# Patient Record
Sex: Female | Born: 1945 | Race: Black or African American | Hispanic: No | Smoking: Former smoker
Health system: Southern US, Community
[De-identification: ages and names within clinical notes are randomized; demographics above are authoritative.]

## PROBLEM LIST (undated history)

## (undated) DIAGNOSIS — H409 Unspecified glaucoma: Secondary | ICD-10-CM

## (undated) DIAGNOSIS — Z9289 Personal history of other medical treatment: Secondary | ICD-10-CM

## (undated) DIAGNOSIS — E039 Hypothyroidism, unspecified: Secondary | ICD-10-CM

## (undated) DIAGNOSIS — R002 Palpitations: Secondary | ICD-10-CM

## (undated) DIAGNOSIS — N289 Disorder of kidney and ureter, unspecified: Secondary | ICD-10-CM

## (undated) DIAGNOSIS — R351 Nocturia: Secondary | ICD-10-CM

## (undated) DIAGNOSIS — F32A Depression, unspecified: Secondary | ICD-10-CM

## (undated) DIAGNOSIS — F329 Major depressive disorder, single episode, unspecified: Secondary | ICD-10-CM

## (undated) DIAGNOSIS — C50919 Malignant neoplasm of unspecified site of unspecified female breast: Secondary | ICD-10-CM

## (undated) DIAGNOSIS — C801 Malignant (primary) neoplasm, unspecified: Secondary | ICD-10-CM

## (undated) DIAGNOSIS — M1990 Unspecified osteoarthritis, unspecified site: Secondary | ICD-10-CM

## (undated) DIAGNOSIS — G4733 Obstructive sleep apnea (adult) (pediatric): Secondary | ICD-10-CM

## (undated) DIAGNOSIS — I10 Essential (primary) hypertension: Secondary | ICD-10-CM

## (undated) DIAGNOSIS — E7800 Pure hypercholesterolemia, unspecified: Secondary | ICD-10-CM

## (undated) DIAGNOSIS — N898 Other specified noninflammatory disorders of vagina: Secondary | ICD-10-CM

## (undated) DIAGNOSIS — E079 Disorder of thyroid, unspecified: Secondary | ICD-10-CM

## (undated) DIAGNOSIS — K219 Gastro-esophageal reflux disease without esophagitis: Secondary | ICD-10-CM

## (undated) HISTORY — DX: Unspecified glaucoma: H40.9

## (undated) HISTORY — PX: MASTECTOMY: SHX3

## (undated) HISTORY — DX: Obstructive sleep apnea (adult) (pediatric): G47.33

## (undated) HISTORY — DX: Essential (primary) hypertension: I10

## (undated) HISTORY — DX: Disorder of thyroid, unspecified: E07.9

## (undated) HISTORY — DX: Malignant (primary) neoplasm, unspecified: C80.1

## (undated) HISTORY — PX: COLONOSCOPY: SHX174

## (undated) HISTORY — DX: Depression, unspecified: F32.A

## (undated) HISTORY — PX: BREAST BIOPSY: SHX20

## (undated) HISTORY — DX: Major depressive disorder, single episode, unspecified: F32.9

## (undated) HISTORY — PX: THYROIDECTOMY: SHX17

## (undated) HISTORY — DX: Pure hypercholesterolemia, unspecified: E78.00

---

## 1980-08-16 HISTORY — PX: ABDOMINAL HYSTERECTOMY: SHX81

## 1997-12-31 ENCOUNTER — Ambulatory Visit (HOSPITAL_COMMUNITY): Admission: RE | Admit: 1997-12-31 | Discharge: 1997-12-31 | Payer: Self-pay | Admitting: *Deleted

## 1998-07-08 ENCOUNTER — Other Ambulatory Visit: Admission: RE | Admit: 1998-07-08 | Discharge: 1998-07-08 | Payer: Self-pay | Admitting: *Deleted

## 1999-11-26 ENCOUNTER — Ambulatory Visit (HOSPITAL_COMMUNITY): Admission: RE | Admit: 1999-11-26 | Discharge: 1999-11-26 | Payer: Self-pay | Admitting: *Deleted

## 1999-12-22 ENCOUNTER — Other Ambulatory Visit: Admission: RE | Admit: 1999-12-22 | Discharge: 1999-12-22 | Payer: Self-pay | Admitting: Obstetrics and Gynecology

## 2000-06-23 ENCOUNTER — Emergency Department (HOSPITAL_COMMUNITY): Admission: EM | Admit: 2000-06-23 | Discharge: 2000-06-23 | Payer: Self-pay | Admitting: Internal Medicine

## 2001-11-04 ENCOUNTER — Emergency Department (HOSPITAL_COMMUNITY): Admission: EM | Admit: 2001-11-04 | Discharge: 2001-11-04 | Payer: Self-pay | Admitting: Emergency Medicine

## 2001-11-04 ENCOUNTER — Encounter: Payer: Self-pay | Admitting: Emergency Medicine

## 2002-07-31 ENCOUNTER — Encounter: Admission: RE | Admit: 2002-07-31 | Discharge: 2002-07-31 | Payer: Self-pay | Admitting: Internal Medicine

## 2002-07-31 ENCOUNTER — Encounter: Payer: Self-pay | Admitting: Internal Medicine

## 2002-08-16 DIAGNOSIS — C801 Malignant (primary) neoplasm, unspecified: Secondary | ICD-10-CM

## 2002-08-16 HISTORY — PX: BREAST SURGERY: SHX581

## 2002-08-16 HISTORY — DX: Malignant (primary) neoplasm, unspecified: C80.1

## 2002-11-19 ENCOUNTER — Encounter (INDEPENDENT_AMBULATORY_CARE_PROVIDER_SITE_OTHER): Payer: Self-pay | Admitting: *Deleted

## 2002-11-19 ENCOUNTER — Ambulatory Visit (HOSPITAL_COMMUNITY): Admission: RE | Admit: 2002-11-19 | Discharge: 2002-11-19 | Payer: Self-pay | Admitting: *Deleted

## 2002-12-21 ENCOUNTER — Other Ambulatory Visit: Admission: RE | Admit: 2002-12-21 | Discharge: 2002-12-21 | Payer: Self-pay | Admitting: Radiology

## 2003-02-11 ENCOUNTER — Ambulatory Visit (HOSPITAL_BASED_OUTPATIENT_CLINIC_OR_DEPARTMENT_OTHER): Admission: RE | Admit: 2003-02-11 | Discharge: 2003-02-11 | Payer: Self-pay | Admitting: Surgery

## 2003-02-11 ENCOUNTER — Encounter (INDEPENDENT_AMBULATORY_CARE_PROVIDER_SITE_OTHER): Payer: Self-pay | Admitting: *Deleted

## 2003-02-25 ENCOUNTER — Encounter: Payer: Self-pay | Admitting: Surgery

## 2003-02-25 ENCOUNTER — Encounter (HOSPITAL_COMMUNITY): Admission: RE | Admit: 2003-02-25 | Discharge: 2003-05-26 | Payer: Self-pay | Admitting: Surgery

## 2003-02-26 ENCOUNTER — Encounter: Payer: Self-pay | Admitting: Surgery

## 2003-04-25 ENCOUNTER — Ambulatory Visit (HOSPITAL_BASED_OUTPATIENT_CLINIC_OR_DEPARTMENT_OTHER): Admission: RE | Admit: 2003-04-25 | Discharge: 2003-04-25 | Payer: Self-pay | Admitting: Surgery

## 2003-04-25 ENCOUNTER — Encounter (INDEPENDENT_AMBULATORY_CARE_PROVIDER_SITE_OTHER): Payer: Self-pay | Admitting: Surgery

## 2003-04-25 ENCOUNTER — Encounter (INDEPENDENT_AMBULATORY_CARE_PROVIDER_SITE_OTHER): Payer: Self-pay | Admitting: *Deleted

## 2003-05-07 ENCOUNTER — Ambulatory Visit: Admission: RE | Admit: 2003-05-07 | Discharge: 2003-06-15 | Payer: Self-pay | Admitting: Radiation Oncology

## 2003-06-20 ENCOUNTER — Ambulatory Visit (HOSPITAL_COMMUNITY): Admission: RE | Admit: 2003-06-20 | Discharge: 2003-06-20 | Payer: Self-pay | Admitting: Surgery

## 2003-06-20 ENCOUNTER — Encounter (INDEPENDENT_AMBULATORY_CARE_PROVIDER_SITE_OTHER): Payer: Self-pay | Admitting: *Deleted

## 2003-06-20 ENCOUNTER — Ambulatory Visit (HOSPITAL_BASED_OUTPATIENT_CLINIC_OR_DEPARTMENT_OTHER): Admission: RE | Admit: 2003-06-20 | Discharge: 2003-06-20 | Payer: Self-pay | Admitting: Surgery

## 2004-07-27 ENCOUNTER — Ambulatory Visit (HOSPITAL_BASED_OUTPATIENT_CLINIC_OR_DEPARTMENT_OTHER): Admission: RE | Admit: 2004-07-27 | Discharge: 2004-07-27 | Payer: Self-pay | Admitting: Otolaryngology

## 2004-10-28 ENCOUNTER — Ambulatory Visit (HOSPITAL_COMMUNITY): Admission: RE | Admit: 2004-10-28 | Discharge: 2004-10-28 | Payer: Self-pay | Admitting: *Deleted

## 2004-11-05 ENCOUNTER — Ambulatory Visit: Payer: Self-pay

## 2004-11-11 ENCOUNTER — Ambulatory Visit: Payer: Self-pay

## 2006-05-02 ENCOUNTER — Ambulatory Visit: Payer: Self-pay | Admitting: Pulmonary Disease

## 2006-06-01 ENCOUNTER — Ambulatory Visit: Payer: Self-pay | Admitting: *Deleted

## 2006-06-06 ENCOUNTER — Ambulatory Visit: Payer: Self-pay | Admitting: Pulmonary Disease

## 2006-06-08 ENCOUNTER — Ambulatory Visit: Payer: Self-pay

## 2008-03-04 ENCOUNTER — Ambulatory Visit: Payer: Self-pay

## 2010-09-06 ENCOUNTER — Encounter: Payer: Self-pay | Admitting: Emergency Medicine

## 2011-01-01 NOTE — Op Note (Signed)
NAME:  Alyssa Jefferson, Alyssa Jefferson                          ACCOUNT NO.:  [id number]   MEDICAL RECORD NO.:  [id number]                   PATIENT TYPE:  AMB   LOCATION:  DSC                                  FACILITY:  [facility]   PHYSICIAN:  Currie Paris, M.D.           DATE OF BIRTH:  1946/01/01   DATE OF PROCEDURE:  06/20/2003  DATE OF DISCHARGE:                                 OPERATIVE REPORT   CCS [id number]   PREOPERATIVE DIAGNOSIS:  Diffuse ductal carcinoma in situ, left breast.   POSTOPERATIVE DIAGNOSIS:  Diffuse ductal carcinoma in situ, left breast.   OPERATION PERFORMED:  Left total mastectomy with blue dye injection and left  axillary sentinel lymph node biopsy.   SURGEON:  Currie Paris, M.D.   ASSISTANT:  Ollen Gross. Carolynne Edouard, M.D.   ANESTHESIA:  General endotracheal.   INDICATIONS FOR PROCEDURE:  This patient is a 65 year old lady who presented  with nipple discharge and a biopsy showed DCIS.  This was very centrally  located and under the nipple so she underwent a partial mastectomy to  include excision of the nipple areolar complex.  Unfortunately, she had  diffuse DCIS including margins involved and after lengthy discussion, she  elected to proceed to mastectomy.  She had a plastic surgery consultation  but elected not at this point to have an immediate reconstruction.  Because  we are doing a total mastectomy and were concerned that there might be some  invasive disease turned up, elected to proceed to sentinel node biopsy at  this time.   DESCRIPTION OF PROCEDURE:  The patient was seen in the holding area and had  no further questions.  She had already been injected with her radioactive  isotope.  She was taken to the operating room and after satisfactory general  endotracheal anesthesia had been obtained, about 3mL of dilute methylene  blue solution was injected in the dermis above the prior scar.  The breast  was then prepped and draped.  I outlined and then made an  elliptical  incision excising the old scar.  Using cautery, I raised the skin flap  medially to the sternum, superiorly to the clavicle and then into the  axilla.  I used the NeoProbe to identify a hot area in the axilla. When I  got the skin elevated so I could expose this area, I divided the  subcutaneous tissue and entered the axilla.  I found initially a firm node  which appeared to be hot and I excised that but on taking it out, it  appeared that there was a node directly below this that was the hot one and  this one was not.  This second node was excised and was not blue but did  have increased counts.  There was a third very firm node just below this and  this was also taken out but again was not hot or blue.  There was one other  area that was hot.  Further dissection this located a little bit more  inferiorly a very blue hot node that was firm but not rock hard and this was  excised.  We asked pathology to do Touch Preps on the two hot nodes but not  the other two, were sent for routine pathology.   While waiting for that to come, I went ahead and made the inferior skin  incision and raised an inferior flap to the inframammary fold and then  laterally to latissimus.  The breast was removed from the underlying muscle  with cautery starting medially and working laterally and finally detached  from its incisions to the axillary contents without really doing any further  axillary dissection.  Bleeding was checked and appeared to be dry.  I used  clips on some vessels in the axilla and primarily cautery on the skin flaps.  The flaps appeared to be viable, neither too thick, nor too thin, but the  patient is obese and I thought there was likely going to be some extra  tissues laterally but we appeared to have very minimal of this and I thought  we didn't need to take any further at this point.  Two 19 Blake drains were  placed and secured with 3-0 nylons.  The incision closed with  staples.  I  left 30mL of plain 0.25% Marcaine subcutaneously for about 10 minutes at the  end of the case to see if that would help with postoperative analgesia.  The  patient tolerated the procedure well.  There were no operative  complications.  All counts were correct.  The estimated blood loss was about  75mL.                                               Currie Paris, M.D.    CJS/MEDQ  D:  06/20/2003  T:  06/21/2003  Job:  [id number]   cc:   Wilson Singer, M.D.  [address]  [place]  [place] [address]  Fax: [phone]

## 2011-01-01 NOTE — Op Note (Signed)
Alyssa Jefferson, Alyssa Jefferson                          ACCOUNT NO.:  [id number]   MEDICAL RECORD NO.:  [id number]                   PATIENT TYPE:  AMB   LOCATION:  DSC                                  FACILITY:  [facility]   PHYSICIAN:  Currie Paris, M.D.           DATE OF BIRTH:  10-Mar-1946   DATE OF PROCEDURE:  04/25/2003  DATE OF DISCHARGE:                                 OPERATIVE REPORT   CCS #[id number]   PREOPERATIVE DIAGNOSIS:  Ductal carcinoma in situ, left breast.   POSTOPERATIVE DIAGNOSIS:  Ductal carcinoma in situ, left breast.   OPERATION:  Left partial mastectomy.   SURGEON:  Currie Paris, M.D.   ANESTHESIA:  General.   CLINICAL HISTORY:  This patient is a 65 year old who had a nipple discharge,  and a biopsy of the subareolar tissue showed diffuse DCIS with positive  margins.  MRI showed disease probably going almost to the chest wall and  extending around this central area of her breast.  After a lengthy  discussion with the patient about alternatives as well as second opinion  consultation, she elected to do a partial mastectomy to include resection of  the nipple-areolar complex.   DESCRIPTION OF PROCEDURE:  The patient was seen in the holding area and had  no further questions.  The left breast was marked as the operative side.   The patient was taken to the operating room and after satisfactory general  endotracheal anesthesia obtained, the left breast was prepped and draped.  I  outlined an elliptical transverse incision going around the nipple and made  the skin incision.  Using a cautery, I divided through the subcutaneous  tissue, breast tissue, down to chest wall, first superiorly, then medially,  then inferiorly, and following disconnected the specimen from its lateral  attachments.  I did go down to chest wall, leaving just a little bit of  fatty tissue on the muscle.  Bleeders were controlled with the cautery.  In  palpating the residual margins,  there was some irregular nodular tissue  along the superior margin laterally, and I took another 1.5 cm thickness  area of tissue here and sent this as a separate specimen in case there was  some DCIS involving this area of breast tissue going up toward the axilla.  Again everything appeared to be dry.  I had injected some Marcaine in the  skin prior to making the incision and injected some more in the deeper  tissues prior to closing.   Once everything appeared to be dry, I closed in layers with running 3-0  Vicryl and then 3-0 Monocryl subcuticular plus Dermabond on the skin.   The patient tolerated the procedure well.  There were no operative  complications.  All counts were correct.  Currie Paris, M.D.    CJS/MEDQ  D:  04/25/2003  T:  04/25/2003  Job:  [id number]   cc:   Wilson Singer, M.D.  [address]  [place]  [place] [address]  Fax: [phone]   [facility] Radiology   [facility]

## 2011-01-01 NOTE — Assessment & Plan Note (Signed)
[facility]                               PULMONARY OFFICE NOTE   NAME:MONTAGUESusi, Goslin                       MRN:          [id number]  DATE:06/06/2006                            DOB:          [birth year]    SUBJECTIVE:  Ms. Konen comes in today for followup after her recent  initiation on CPAP.  The patient is doing very well with this.  She is  having no problems with mask leak and the pressure has not been an issue for  her.  She is seeing a much improved alertness during the day as well as  sleep efficiency.   PHYSICAL EXAM:  GENERAL:  She is an overweight black female in no acute  distress.  Blood pressure 132/84, pulse 57, temperature 98.1, weight 237 pounds, O2  saturation on room air is 97%.  There is no evidence of skin breakdown or pressure necrosis from the CPAP  mask.   IMPRESSION:  Severe obstructive sleep apnea, which is much improved  clinically with continuous positive airway pressure.  At this point in time,  we need to optimize the patient's pressure for her, and she needs to work  aggressively on weight loss.   PLAN:  1. Arrange for auto-titrate device for the next 2 weeks that we can      optimize her CPAP pressure.  I will then call her and have her machine      set to the appropriate level.  2. Work on weight loss.  3. Follow up in 6 months or sooner if there are problems.            ______________________________  Barbaraann Share, MD,FCCP      [facility]/MedQ  DD:  06/06/2006  DT:  06/06/2006  Job #:  [id number]   cc:   Hermelinda Medicus, M.D.  [place] Cleta Alberts, M.D.

## 2011-01-01 NOTE — Letter (Signed)
June 01, 2006    Brett Canales A. Cleta Alberts, M.D.  [address]  [place], [place] [address]   RE:  Alyssa Jefferson, Alyssa Jefferson  MRN:  [id number]  /  DOB:  1946/06/22   Dear Brett Canales:   I appreciate the opportunity of evaluating your nice patient, Detrice Cales,  on June 01, 2006.  As you know, she is a very pleasant 65 year old,  obese, black female, [employer] employee, with multiple risk factors including  hypertension, hyperlipidemia, obesity and severe obstructive sleep apnea.  As you noted, on the 5th of October she experienced rather significant chest  discomfort while walking at work.  This was relieved within minutes of rest.  She had some shortness of breath with increased heartbeat when walking into  the office today.   She has had no other episodes of chest discomfort.  She had a normal stress  Myoview November 11, 2004.  Recently she was evaluated by Dr. Marcelyn Bruins for  sleep apnea.   PAST MEDICAL HISTORY:  As noted, reveals:  1. Hypertension.  2. Hyperlipidemia.  3. Breast cancer with left mastectomy.  4. Allergic rhinitis.  5. Post hysterectomy.  6. Post thyroidectomy, on Synthroid replacement.  7. History of knee surgery.   MEDICATIONS:  1. Aspirin 81 mg.  2. Synthroid unknown dose.  3. Hydrochlorothiazide unknown dose.  4. Zoloft.  5. Lisinopril, of which the dosage is unknown at this time.   SOCIAL HISTORY:  She is married and has 5 stepchildren.  She smoked a pack a  week for 10 years, but none since 1985.  She works at [employer], where she  has been for 25 years.  No drug use.   FAMILY HISTORY:  Remarkable for a sister with allergies and asthma, father  had lung cancer.  Both parents deceased, no cardiovascular disease.  One  brother and two sisters living, with no cardiovascular disease.  There is a  family history of hypertension, diabetes, gout.   ALLERGIES:  CODEINE.   REVIEW OF SYSTEMS:  HEENT:  History of tonsillitis.  CARDIORESPIRATORY:  Is  in the present illness.   GASTROINTESTINAL:  History unremarkable.  She has  had transfusions in the past.  GYNECOLOGIC:  History unremarkable.  Weight  maximum 241, minimum 230.   PHYSICAL EXAMINATION:  VITAL SIGNS:  Blood pressure 138/88, pulse 59, normal  sinus rhythm.  GENERAL APPEARANCE:  Obese, in no distress.  NECK:  JVP is not elevated, carotid pulses are palpable and equal without  bruits.  LUNGS:  Clear.  CARDIAC:  Exam reveals no murmur.  ABDOMEN:  Exam unremarkable.  Liver, spleen and kidneys not palpable.  EXTREMITIES:  Normal pulses palpable and equal bilaterally.   EKG within normal limits.   IMPRESSION:  1. Chest pain with multiple risk factors for coronary artery disease, with      coronary disease to be ruled out - normal stress test a year and a half      ago.  2. Obesity.  3. Severe sleep apnea.  4. Hyperlipidemia.  5. Hypertension.   Since the patient has not had recurrent chest pain in the past 12 days, I  suggested a stress Myoview.  We also plan to check her lipids, as I think  she should probably be on a statin, and we will check a D-dimer as we want  to rule out the possibility of pulmonary emboli.   Thank you for the opportunity of evaluating this nice patient with you.   Sincerely,  ______________________________  E. Graceann Congress, MD, [facility]    EJL/MedQ  /  Job #:  [id number]  DD:  06/01/2006 / DT:  06/03/2006

## 2011-01-01 NOTE — Assessment & Plan Note (Signed)
[facility]                               PULMONARY OFFICE NOTE   NAME:Alyssa Jefferson, Gohlke                       MRN:          [id number]  DATE:05/02/2006                            DOB:          1945-12-16    SLEEP MEDICINE CONSULTATION   HISTORY OF PRESENT ILLNESS:  The patient is a very pleasant 65 year old  black female whom I have been asked to see for severe sleep apnea.  The  patient has undergone nocturnal polysomnography on July 27, 2004, where  she was found to have a respiratory disturbance index of 60 events per hour  and O2 saturation as low as 64%.  Since that study, she has gained about 30  pounds, over the last 1 year, and therefore is very likely to have more  severe sleep apnea than indicated on this study.  The patient states that  she typically goes to be between 9:30 and 10:00 and gets up at 4:50 to start  her day.  She is not rested upon arising.  She has been told that she has  loud snoring and pauses in her breathing during sleep.  The patient works as  a Secondary school teacher for [employer] and notes significant sleep pressure during the day  with periods of inactivity.  She has occasional dozing in the evening with  TV or movies.  She also gets some sleepiness with driving long distances.   PAST MEDICAL HISTORY:  1. Significant for hypertension.  2. History of dyslipidemia.  3. History of breast cancer with left mastectomy in the past, but no      chemotherapy.  4. History of allergic rhinitis.  5. Status post hysterectomy.  6. Status post thyroidectomy and is on Synthroid replacement.  7. history of knee surgery 20 years ago.   CURRENT MEDICATIONS:  1. Aspirin 81 mg q. day.  2. Synthroid of unknown dose q. day.  3. Hydrochlorothiazide of unknown dose q. day.  4. Zoloft of unknown dose q. day.  5. Lisinopril of unknown dose q. day.   THE PATIENT IS INTOLERANT TO CODEINE.   SOCIAL HISTORY:  She is married.  She has a history of  smoking one pack per  week for 10 years.  She has not smoked since 1985.   FAMILY HISTORY:  Remarkable for a sister having allergy and asthma.  Her  father had lung cancer.   REVIEW OF SYSTEMS:  As per history of present illness.  Also see the patient  intake form  documented in the chart.   PHYSICAL EXAM:  GENERAL:  She is a morbidly obese black female in no acute  distress.  VITAL SIGNS:  Blood pressure 144/80, pulse 50, temperature 97.6, weight 241  pounds.  O2 saturation on room air is 95%.  HEENT:  Pupils are equal, round, and reactive to light and accommodation.  Extraocular muscles are intact.  Nares show mild septal deviation to the  left.  Oropharynx shows soft tissue redundancy in the posterior pharynx with  side-wall narrowing, and also elongation of the palate, but a normal uvula.  NECK:  Supple without JVD or lymphadenopathy.  There is no palpable  thyromegaly.  CHEST:  Totally clear.  CARDIAC:  Regular rate and rhythm with a 1/6 systolic murmur.  ABDOMEN:  Soft and nontender with good bowel sounds.  GENITAL:  Not done and not indicated.  RECTAL:  Not done and not indicated.  BREASTS:  Not done and not indicated.  EXTREMITIES:  Lower extremities show no edema.  Pulses are intact distally.  NEUROLOGIC:  Alert and oriented with no obvious observable motor defects.   IMPRESSION:  Severe obstructive sleep apnea.  I have gone over the  pathophysiology of sleep apnea with her, including the short-term quality of  life issues and the long-term cardiovascular issues.  At this point in time,  continuous positive airway pressure coupled with weight loss is in her best  interest for treatment.  The patient is agreeable with this approach.   PLAN:  1. Will initiate CPAP at 10 cm and she is to follow up in four weeks so we      can see how she responds.  Ultimately, she will need pressure      optimization.  2. Work on weight loss.  3. I have asked the patient to check with Dr.  Cleta Alberts to see that he has had      a recent TSH level checked.                                   Barbaraann Share, MD,FCCP   [facility]/MedQ  DD:  05/02/2006  DT:  05/02/2006  Job #:  [id number]   cc:   Brett Canales A. Cleta Alberts, M.D.  Hermelinda Medicus, M.D.

## 2011-01-01 NOTE — Procedures (Signed)
Alyssa Jefferson, Alyssa Jefferson                ACCOUNT NO.:  [id number]   MEDICAL RECORD NO.:  [id number]          PATIENT TYPE:  OUT   LOCATION:  SLEEP CENTER                 FACILITY[facility]  [facility]   PHYSICIAN:  Clinton D. Maple Hudson, M.D. DATE OF BIRTH:  06/05/1946   DATE OF STUDY:  07/27/2004                              NOCTURNAL POLYSOMNOGRAM   REFERRING PHYSICIAN:  Dr. Hermelinda Medicus.   INDICATION FOR STUDY:  Hypersomnia with sleep apnea.   SLEEP ARCHITECTURE:  Total sleep time 331 minutes with sleep efficiency 88%,  stage I 12%, stage II 68%, stages III and IV were absent.  REM was 20% of  total sleep time.  Sleep latency 200 minutes.  REM latency 184 minutes.  Awake after sleep onset 18 minutes.  Arousal index 45.   RESPIRATORY DATA -- NOCTURNAL POLYSOMNOGRAPHIC PROTOCOL:  RDI 59.7 per hour,  indicating severe obstructive sleep apnea/hypopnea syndrome.  This included  125 obstructive apneas and 177 hypopneas.  Events were not positional.  REM  RDI 120 per hour.   OXYGEN DATA:  Loud snoring with oxygen desaturation to a nadir of 64% during  apneas.  Mean oxygen saturation through the study was 95% on room air.   CARDIAC DATA:  Normal sinus rhythm.   MOVEMENT/PARASOMNIA:  Occasional leg jerk, insignificant.   IMPRESSION/RECOMMENDATION:  Severe obstructive sleep apnea/hypopnea  syndrome, respiratory disturbance index 59.7 per hour with desaturation to  64% on room air.  Consider return for continuous positive airway pressure  titration or evaluation for alternative therapies as appropriate.                                                           Clinton D. Maple Hudson, M.D.  Diplomate, [employer] Board   CDY/MEDQ  D:  08/02/2004 11:13:55  T:  08/03/2004 11:54:07  Job:  [id number]

## 2011-01-01 NOTE — Op Note (Signed)
Alyssa Jefferson, Alyssa Jefferson                          ACCOUNT NO.:  [id number]   MEDICAL RECORD NO.:  [id number]                   PATIENT TYPE:  AMB   LOCATION:  DSC                                  FACILITY:  [facility]   PHYSICIAN:  Currie Paris, M.D.           DATE OF BIRTH:  03/23/1946   DATE OF PROCEDURE:  02/11/2003  DATE OF DISCHARGE:                                 OPERATIVE REPORT   OFFICE MEDICAL RECORD NUMBER:  [id number].   PREOPERATIVE DIAGNOSIS:  Bloody nipple discharge, left breast.   POSTOPERATIVE DIAGNOSIS:  Bloody nipple discharge, left breast.   OPERATION:  Ductal excision, left breast.   SURGEON:  Currie Paris, M.D.   ANESTHESIA:  General.   CLINICAL HISTORY:  The patient is a 65 year old with a nipple discharge from  the left breast that we were unable to further characterize with ductogram  because of inability to cannulated the duct as it seemed to be obstructed.  We elected to proceed to biopsy.   DESCRIPTION OF PROCEDURE:  The patient was seen in the holding area and had  no further  questions. The left breast was marked as the operative site. She  was taken to the operating room and after satisfactory general endotracheal  anesthesia was obtained the breast was prepped and draped.   There was a readily discernable bloody nipple discharge coming from a duct  which was at about the 2:30 to 3 o'clock position. I tried to cannulate it  with a 25 angiocath and injected a tiny bit of methylene blue in. I then  tried to cannulate it with a tiny tear duct probe, but could never get the  probe to go in.   I made a curvilinear incision at the areolar margin and lifted the skin up  back towards the nipple proper. I saw the dilated duct and there was what  appeared  to be a plexus of dilated ducts. I divided the duct as it exited  the nipple, and then using cautery, I took a large cylinder of tissue coming  out in the 2 to 4 o'clock position going deep and  somewhat laterally and  tried to encompass as much as much of these dilated ductal systems as I  could. Once this  was done I made sure everything was dry using cautery and then closed with 3-  0 Vicryl followed  by 4-0 Monocryl subcuticular and Steri-Strips.   The patient tolerated the procedure well. There were no interoperative  complications. All counts were correct.                                               Currie Paris, M.D.    CJS/MEDQ  D:  02/11/2003  T:  02/11/2003  Job:  [id number]  cc:   Wilson Singer, M.D.  [address]  [place]  [place] [address]  Fax: [phone]   [facility] Radiology

## 2011-09-20 ENCOUNTER — Encounter: Payer: Self-pay | Admitting: Gynecology

## 2011-09-20 ENCOUNTER — Ambulatory Visit (INDEPENDENT_AMBULATORY_CARE_PROVIDER_SITE_OTHER): Payer: PRIVATE HEALTH INSURANCE | Admitting: Gynecology

## 2011-09-20 VITALS — BP 130/76 | Ht 62.0 in | Wt 240.0 lb

## 2011-09-20 DIAGNOSIS — N952 Postmenopausal atrophic vaginitis: Secondary | ICD-10-CM

## 2011-09-20 DIAGNOSIS — L292 Pruritus vulvae: Secondary | ICD-10-CM

## 2011-09-20 DIAGNOSIS — E7800 Pure hypercholesterolemia, unspecified: Secondary | ICD-10-CM | POA: Insufficient documentation

## 2011-09-20 DIAGNOSIS — Z780 Asymptomatic menopausal state: Secondary | ICD-10-CM

## 2011-09-20 DIAGNOSIS — C801 Malignant (primary) neoplasm, unspecified: Secondary | ICD-10-CM | POA: Insufficient documentation

## 2011-09-20 DIAGNOSIS — L293 Anogenital pruritus, unspecified: Secondary | ICD-10-CM

## 2011-09-20 LAB — WET PREP FOR TRICH, YEAST, CLUE
Clue Cells Wet Prep HPF POC: NONE SEEN
Trich, Wet Prep: NONE SEEN
Yeast Wet Prep HPF POC: NONE SEEN

## 2011-09-20 MED ORDER — TERCONAZOLE 0.4 % VA CREA: 1.0000 | TOPICAL_CREAM | Freq: Every day | VAGINAL | Status: AC

## 2011-09-20 MED ORDER — FLUCONAZOLE 200 MG PO TABS: 200.0000 mg | ORAL_TABLET | Freq: Every day | ORAL | Status: AC

## 2011-09-20 NOTE — Patient Instructions (Signed)
Use vaginal cream nightly internally and externally for 7 days and take the Diflucan pill daily for one week.  Schedule your bone density.  Follow up if symptoms persist.

## 2011-09-20 NOTE — Progress Notes (Signed)
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 She notes doing well until [facility] when she had the onset of pruritus. She was evaluated at urgent medical care treated for BV with Flagyl but her symptoms persisted. She was reassessed and treated with Diflucan x1 dose but again her symptoms have persisted. No history of issues prior to [place]. She sees [facility] for her routine health maintenance and they do her Pap smears breast exams and general health maintenance per her history.  Exam with Sherrilyn Rist chaperone present Abdomen soft nontender without masses guarding rebound organomegaly. Obesity limits the exam Pelvic external grossly normal with mild atrophic changes. Vagina scant white discharge. Bimanual without gross masses or tenderness. Rectovaginal exam normal.  Assessment and plan: Persistent vulvar pruritus. Exam is normal without evidence of lesions/overt dermatitis/lichen sclerosis changes. Wet prep is negative. We'll treat for a persistent low-level fungal with Terazol 7 day cream and Diflucan 200 mg nightly x7 days. Have asked her stop her cholesterol medication during this time. If her symptoms persist I discussed possible need for biopsy and she's going to follow up with me if her symptoms persist. In general health questioning she is up-to-date as far as mammograms colonoscopies although never had a bone density I asked her to go ahead and schedule one as a baseline and she agrees to do so.

## 2011-09-21 ENCOUNTER — Other Ambulatory Visit: Payer: Self-pay | Admitting: *Deleted

## 2011-09-30 ENCOUNTER — Ambulatory Visit (INDEPENDENT_AMBULATORY_CARE_PROVIDER_SITE_OTHER): Payer: PRIVATE HEALTH INSURANCE

## 2011-09-30 DIAGNOSIS — Z780 Asymptomatic menopausal state: Secondary | ICD-10-CM

## 2011-09-30 DIAGNOSIS — Z13820 Encounter for screening for osteoporosis: Secondary | ICD-10-CM

## 2011-10-19 ENCOUNTER — Telehealth: Payer: Self-pay

## 2011-10-19 NOTE — Telephone Encounter (Signed)
Pt called states she had been coughing and has flu like symptoms. Wants to know if antibiotic can be called in  Please call pt at [phone] Pharmacy: [facility] on [address]

## 2011-10-20 ENCOUNTER — Ambulatory Visit (INDEPENDENT_AMBULATORY_CARE_PROVIDER_SITE_OTHER): Payer: PRIVATE HEALTH INSURANCE | Admitting: Emergency Medicine

## 2011-10-20 ENCOUNTER — Other Ambulatory Visit: Payer: Self-pay | Admitting: Emergency Medicine

## 2011-10-20 VITALS — BP 150/77 | HR 71 | Temp 98.0°F | Resp 16 | Ht 62.5 in | Wt 242.0 lb

## 2011-10-20 DIAGNOSIS — D229 Melanocytic nevi, unspecified: Secondary | ICD-10-CM

## 2011-10-20 DIAGNOSIS — J9801 Acute bronchospasm: Secondary | ICD-10-CM

## 2011-10-20 DIAGNOSIS — R05 Cough: Secondary | ICD-10-CM

## 2011-10-20 DIAGNOSIS — R059 Cough, unspecified: Secondary | ICD-10-CM

## 2011-10-20 MED ORDER — ALBUTEROL SULFATE HFA 108 (90 BASE) MCG/ACT IN AERS: 2.0000 | INHALATION_SPRAY | RESPIRATORY_TRACT | Status: DC | PRN

## 2011-10-20 MED ORDER — ALBUTEROL SULFATE (2.5 MG/3ML) 0.083% IN NEBU
2.5000 mg | INHALATION_SOLUTION | Freq: Once | RESPIRATORY_TRACT | Status: AC
  Administered 2011-10-20: 2.5 mg via RESPIRATORY_TRACT

## 2011-10-20 MED ORDER — BENZONATATE 200 MG PO CAPS: 200.0000 mg | ORAL_CAPSULE | Freq: Three times a day (TID) | ORAL | Status: AC | PRN

## 2011-10-20 NOTE — Progress Notes (Signed)
  Subjective:    Patient ID: Alyssa Jefferson, female    DOB: 17-May-1946, 66 y.o.   MRN: [id number]  HPI patient enters with a chief complaint of a dry nonproductive cough. This is been going on for 6-8 weeks. She does have some mild reflux but otherwise she says these symptoms are very mild. Not had any production with her cough. She does not feel short of breath. She does occasionally note some wheezing.    Review of Systems she has not felt particularly ill however she is unable to get relief from her cough     Objective:   Physical Exam  Constitutional: She appears well-developed and well-nourished.  HENT:  Head: Normocephalic.  Eyes: Pupils are equal, round, and reactive to light.  Neck: Neck supple. No JVD present. No tracheal deviation present. No thyromegaly present.  Cardiovascular: Normal rate, regular rhythm and normal heart sounds.   Pulmonary/Chest: No respiratory distress. She has wheezes. She has no rales. She exhibits no tenderness.  Lymphadenopathy:    She has no cervical adenopathy.    There is a 3 x 3 mm firm raised mole like area left supraclavicular area. This area was prepped with Betadine then with alcohol none with a half cc of 2% plain removed with scissors. Patient sent for evaluation.      Assessment & Plan:  Assessment is cough with wheezes noted on respiratory exam. Give albuterol neb and then recheck.

## 2011-10-20 NOTE — Telephone Encounter (Signed)
Informed pt she will need to rtc for abx--she hasn't been seen here in a while. Pt understood.

## 2011-10-20 NOTE — Patient Instructions (Signed)

## 2011-10-25 ENCOUNTER — Telehealth: Payer: Self-pay

## 2011-10-25 NOTE — Telephone Encounter (Signed)
Pt reports that she thinks she needs an Abx called in for her bc she is still coughing up thick yellow sputum and having to blow her nose a lot to keep mucus from running down her throat. The cough med is helping her to cough less often, but the thick yellow sputum worries her. She reports she is sometimes chilled but doesn't know if she has a fever. Pt still has some cough medicine, but would like Abx called to [facility].

## 2011-10-26 MED ORDER — AZITHROMYCIN 250 MG PO TABS: ORAL_TABLET | ORAL | Status: AC

## 2011-10-26 NOTE — Telephone Encounter (Signed)
Called pt, advised ABX sent to her pharmacy

## 2011-10-26 NOTE — Telephone Encounter (Signed)
abx sent in

## 2011-12-20 ENCOUNTER — Ambulatory Visit: Payer: Medicare Other

## 2011-12-20 ENCOUNTER — Ambulatory Visit (INDEPENDENT_AMBULATORY_CARE_PROVIDER_SITE_OTHER): Payer: Medicare Other | Admitting: Internal Medicine

## 2011-12-20 VITALS — BP 168/78 | HR 67 | Temp 98.5°F | Resp 16 | Ht 61.58 in | Wt 241.4 lb

## 2011-12-20 DIAGNOSIS — M25559 Pain in unspecified hip: Secondary | ICD-10-CM

## 2011-12-20 DIAGNOSIS — M25569 Pain in unspecified knee: Secondary | ICD-10-CM

## 2011-12-20 DIAGNOSIS — M79669 Pain in unspecified lower leg: Secondary | ICD-10-CM

## 2011-12-20 DIAGNOSIS — E039 Hypothyroidism, unspecified: Secondary | ICD-10-CM | POA: Insufficient documentation

## 2011-12-20 DIAGNOSIS — IMO0002 Reserved for concepts with insufficient information to code with codable children: Secondary | ICD-10-CM

## 2011-12-20 DIAGNOSIS — I82819 Embolism and thrombosis of superficial veins of unspecified lower extremities: Secondary | ICD-10-CM

## 2011-12-20 DIAGNOSIS — Z6841 Body Mass Index (BMI) 40.0 and over, adult: Secondary | ICD-10-CM

## 2011-12-20 DIAGNOSIS — E782 Mixed hyperlipidemia: Secondary | ICD-10-CM | POA: Insufficient documentation

## 2011-12-20 DIAGNOSIS — M1710 Unilateral primary osteoarthritis, unspecified knee: Secondary | ICD-10-CM | POA: Insufficient documentation

## 2011-12-20 DIAGNOSIS — F3289 Other specified depressive episodes: Secondary | ICD-10-CM | POA: Insufficient documentation

## 2011-12-20 DIAGNOSIS — I10 Essential (primary) hypertension: Secondary | ICD-10-CM | POA: Insufficient documentation

## 2011-12-20 DIAGNOSIS — D649 Anemia, unspecified: Secondary | ICD-10-CM | POA: Insufficient documentation

## 2011-12-20 DIAGNOSIS — E785 Hyperlipidemia, unspecified: Secondary | ICD-10-CM | POA: Insufficient documentation

## 2011-12-20 DIAGNOSIS — M79609 Pain in unspecified limb: Secondary | ICD-10-CM

## 2011-12-20 DIAGNOSIS — F329 Major depressive disorder, single episode, unspecified: Secondary | ICD-10-CM | POA: Insufficient documentation

## 2011-12-20 MED ORDER — MELOXICAM 15 MG PO TABS: 15.0000 mg | ORAL_TABLET | Freq: Every day | ORAL | Status: DC

## 2011-12-20 NOTE — Progress Notes (Signed)
  Subjective:    Patient ID: Alyssa Jefferson, female    DOB: Sep 25, 1945, 66 y.o.   MRN: [id number]  HPIComplaining of 2 problems #1 right upper outer thigh pain getting worse over 3-4 months/much worse over the past week so that she has constant pain that is sharp and stabbing whether at rest or walking in the right posterior lateral thigh She also has some pins and needles feeling closer to the inguinal and on the right anterior thigh  #2 pain in left knee with weightbearing/present for one year and getting worse/now involve some swelling medial to the knee and hurts whenever she is walking/no redness   Social history= retired from [employer]  Review of SystemsNo fever chills or night sweats No significant weight loss No lumbosacral pain No history of DVT     Objective:   Physical Exam Morbidly obese Blood pressure 168/78 The right upper thigh is very large and there are multiple areas of superficial venous thrombosis some of which are tender/there's no redness/there is no deep bony pain/she has increased sensitivity deep in the fat posteriorly without cord or mass and without pain with Homans stretch/there is no dysesthesias over the anterior part of the thigh The right hip exam is normal  The left knee has no effusion/she is tender along the medial joint line and there is pain but no laxity with stress or/flexes to 90 and extends fully/negative drawer Negative Lachman's Negative McMurray   [facility] reading (PRIMARY) by  Dr. Savina Olshefski=Marked degenerative changes with narrowed joint space      Assessment & Plan:  Problem #1 pain in right side with multiple superficial thromboses Will need Doppler ultrasound to rule out deep venous thrombosis and get some guests at the extent of the superficial thrombosis/for now we'll treat with Mobic and heat  Problem #2 osteoarthritis left knee-severe Will refer to orthopedics to consider alternatives  Problem #3 morbid obesity  Will need  followup for chronic medical problems with her primary care soon

## 2011-12-21 ENCOUNTER — Other Ambulatory Visit: Payer: Self-pay | Admitting: Family Medicine

## 2011-12-21 MED ORDER — ESCITALOPRAM OXALATE 10 MG PO TABS: 10.0000 mg | ORAL_TABLET | Freq: Every day | ORAL | Status: DC

## 2011-12-22 ENCOUNTER — Ambulatory Visit
Admission: RE | Admit: 2011-12-22 | Discharge: 2011-12-22 | Disposition: A | Discharge status: Home / Self Care | Payer: Medicare Other | Source: Ambulatory Visit | Attending: Internal Medicine | Admitting: Internal Medicine

## 2011-12-22 DIAGNOSIS — M79669 Pain in unspecified lower leg: Secondary | ICD-10-CM

## 2012-01-08 ENCOUNTER — Other Ambulatory Visit: Payer: Self-pay | Admitting: Physician Assistant

## 2012-01-08 MED ORDER — LEVOTHYROXINE SODIUM 100 MCG PO TABS: 100.0000 ug | ORAL_TABLET | Freq: Every day | ORAL | Status: DC

## 2012-01-13 ENCOUNTER — Telehealth: Payer: Self-pay

## 2012-01-13 NOTE — Telephone Encounter (Signed)
Pt is saying that the pharmacy has faxed [place] several times regarding her blood pressure meds  And she is out of her medicine

## 2012-01-14 MED ORDER — LOSARTAN POTASSIUM 50 MG PO TABS: 100.0000 mg | ORAL_TABLET | Freq: Every day | ORAL | Status: DC

## 2012-01-14 NOTE — Telephone Encounter (Signed)
LMOM for pt that Rx was sent to pharm and that she is due for office visit/labs bf next RF

## 2012-01-17 ENCOUNTER — Ambulatory Visit (INDEPENDENT_AMBULATORY_CARE_PROVIDER_SITE_OTHER): Payer: Medicare Other | Admitting: Emergency Medicine

## 2012-01-17 VITALS — BP 149/70 | HR 71 | Temp 98.6°F | Resp 16 | Ht 62.0 in | Wt 244.2 lb

## 2012-01-17 DIAGNOSIS — M549 Dorsalgia, unspecified: Secondary | ICD-10-CM

## 2012-01-17 DIAGNOSIS — S335XXA Sprain of ligaments of lumbar spine, initial encounter: Secondary | ICD-10-CM

## 2012-01-17 LAB — POCT UA - MICROSCOPIC ONLY
Bacteria, U Microscopic: NEGATIVE
Casts, Ur, LPF, POC: NEGATIVE
Crystals, Ur, HPF, POC: NEGATIVE
Mucus, UA: NEGATIVE
RBC, urine, microscopic: NEGATIVE
Yeast, UA: NEGATIVE

## 2012-01-17 LAB — POCT URINALYSIS DIPSTICK
Bilirubin, UA: NEGATIVE
Blood, UA: NEGATIVE
Glucose, UA: NEGATIVE
Leukocytes, UA: NEGATIVE
Nitrite, UA: NEGATIVE
Protein, UA: NEGATIVE
Spec Grav, UA: 1.02
Urobilinogen, UA: 0.2
pH, UA: 5.5

## 2012-01-17 MED ORDER — NAPROXEN 500 MG PO TABS: 500.0000 mg | ORAL_TABLET | Freq: Two times a day (BID) | ORAL | Status: DC

## 2012-01-17 MED ORDER — CYCLOBENZAPRINE HCL 10 MG PO TABS: 10.0000 mg | ORAL_TABLET | Freq: Three times a day (TID) | ORAL | Status: AC | PRN

## 2012-01-17 MED ORDER — HYDROCODONE-ACETAMINOPHEN 5-325 MG PO TABS: 1.0000 | ORAL_TABLET | Freq: Four times a day (QID) | ORAL | Status: AC | PRN

## 2012-01-17 NOTE — Progress Notes (Signed)
  Subjective:    Patient ID: Alyssa Jefferson, female    DOB: 09-01-1945, 66 y.o.   MRN: [id number]  Back Pain This is a new problem. The current episode started yesterday. The problem occurs constantly. The problem is unchanged. The pain is present in the sacro-iliac. The quality of the pain is described as aching. The pain does not radiate. The pain is at a severity of 3/10. The pain is moderate. The pain is worse during the day. The symptoms are aggravated by bending, coughing, sitting, standing and twisting. Pertinent negatives include no abdominal pain, bladder incontinence, bowel incontinence, chest pain, dysuria, fever, headaches, leg pain, numbness, paresis, paresthesias, pelvic pain, perianal numbness, tingling, weakness or weight loss. Risk factors include history of cancer, lack of exercise, obesity, menopause, poor posture and sedentary lifestyle. She has tried analgesics for the symptoms. The treatment provided no relief.      Review of Systems  Constitutional: Negative.  Negative for fever and weight loss.  HENT: Negative.   Eyes: Negative.   Respiratory: Negative.   Cardiovascular: Negative for chest pain.  Gastrointestinal: Negative.  Negative for abdominal pain and bowel incontinence.  Genitourinary: Negative.  Negative for bladder incontinence, dysuria and pelvic pain.  Musculoskeletal: Positive for back pain.  Neurological: Negative.  Negative for tingling, weakness, numbness, headaches and paresthesias.  Hematological: Negative.   Psychiatric/Behavioral: Negative.        Objective:   Physical Exam  Constitutional: She is oriented to person, place, and time. She appears well-developed.  Eyes: Conjunctivae are normal. Pupils are equal, round, and reactive to light.  Neck: Normal range of motion. Neck supple. No thyromegaly present.  Cardiovascular: Normal rate, regular rhythm, normal heart sounds and intact distal pulses.   Pulmonary/Chest: Effort normal and breath sounds  normal. No respiratory distress. She has no wheezes. She has no rales.  Abdominal: Soft. Bowel sounds are normal.  Musculoskeletal: Normal range of motion.       Lumbar back: She exhibits tenderness, bony tenderness, pain and spasm. She exhibits normal range of motion, no swelling, no edema, no deformity, no laceration and normal pulse.       Back:  Neurological: She is alert and oriented to person, place, and time. She has normal reflexes.          Assessment & Plan:

## 2012-01-17 NOTE — Patient Instructions (Signed)
Limit activitBack Pain, Adult Low back pain is very common. About 1 in 5 people have back pain.The cause of low back pain is rarely dangerous. The pain often gets better over time.About half of people with a sudden onset of back pain feel better in just 2 weeks. About 8 in 10 people feel better by 6 weeks.  CAUSES Some common causes of back pain include:  Strain of the muscles or ligaments supporting the spine.   Wear and tear (degeneration) of the spinal discs.   Arthritis.   Direct injury to the back.  DIAGNOSIS Most of the time, the direct cause of low back pain is not known.However, back pain can be treated effectively even when the exact cause of the pain is unknown.Answering your caregiver's questions about your overall health and symptoms is one of the most accurate ways to make sure the cause of your pain is not dangerous. If your caregiver needs more information, he or she may order lab work or imaging tests (X-rays or MRIs).However, even if imaging tests show changes in your back, this usually does not require surgery. HOME CARE INSTRUCTIONS For many people, back pain returns.Since low back pain is rarely dangerous, it is often a condition that people can learn to [facility] their own.   Remain active. It is stressful on the back to sit or stand in one place. Do not sit, drive, or stand in one place for more than 30 minutes at a time. Take short walks on level surfaces as soon as pain allows.Try to increase the length of time you walk each day.   Do not stay in bed.Resting more than 1 or 2 days can delay your recovery.   Do not avoid exercise or work.Your body is made to move.It is not dangerous to be active, even though your back may hurt.Your back will likely heal faster if you return to being active before your pain is gone.   Pay attention to your body when you bend and lift. Many people have less discomfortwhen lifting if they bend their knees, keep the load close to  their bodies,and avoid twisting. Often, the most comfortable positions are those that put less stress on your recovering back.   Find a comfortable position to sleep. Use a firm mattress and lie on your side with your knees slightly bent. If you lie on your back, put a pillow under your knees.   Only take over-the-counter or prescription medicines as directed by your caregiver. Over-the-counter medicines to reduce pain and inflammation are often the most helpful.Your caregiver may prescribe muscle relaxant drugs.These medicines help dull your pain so you can more quickly return to your normal activities and healthy exercise.   Put ice on the injured area.   Put ice in a plastic bag.   Place a towel between your skin and the bag.   Leave the ice on for 15 to 20 minutes, 3 to 4 times a day for the first 2 to 3 days. After that, ice and heat may be alternated to reduce pain and spasms.   Ask your caregiver about trying back exercises and gentle massage. This may be of some benefit.   Avoid feeling anxious or stressed.Stress increases muscle tension and can worsen back pain.It is important to recognize when you are anxious or stressed and learn ways to manage it.Exercise is a great option.  SEEK MEDICAL CARE IF:  You have pain that is not relieved with rest or medicine.   You  have pain that does not improve in 1 week.   You have new symptoms.   You are generally not feeling well.  SEEK IMMEDIATE MEDICAL CARE IF:   You have pain that radiates from your back into your legs.   You develop new bowel or bladder control problems.   You have unusual weakness or numbness in your arms or legs.   You develop nausea or vomiting.   You develop abdominal pain.   You feel faint.  Document Released: 08/02/2005 Document Revised: 07/22/2011 Document Reviewed: 12/21/2010 [facility] Patient Information 2012 [place], [place].

## 2012-02-02 ENCOUNTER — Telehealth: Payer: Self-pay

## 2012-02-02 NOTE — Telephone Encounter (Signed)
Pt is unhappy with her nerve medicine. She is taking Lexapro and lorazepam and states they make her very tired and aggravated. She would like to try something else. She reduced the lorazepam but still did not feel any better.

## 2012-02-03 NOTE — Telephone Encounter (Signed)
Please advise patient to RTC for re-eval and discussion of appropriate changes to her regimen

## 2012-02-03 NOTE — Telephone Encounter (Signed)
LMOM TO CB 

## 2012-02-04 NOTE — Telephone Encounter (Signed)
Spoke with patient and let her know that she will need to rtc for evaluation.  She stated she would come in.

## 2012-03-09 ENCOUNTER — Other Ambulatory Visit: Payer: Self-pay | Admitting: Emergency Medicine

## 2012-03-23 ENCOUNTER — Ambulatory Visit (INDEPENDENT_AMBULATORY_CARE_PROVIDER_SITE_OTHER): Payer: Medicare Other | Admitting: Emergency Medicine

## 2012-03-23 VITALS — BP 118/78 | HR 84 | Temp 97.5°F | Resp 16 | Ht 62.5 in | Wt 242.4 lb

## 2012-03-23 DIAGNOSIS — F32A Depression, unspecified: Secondary | ICD-10-CM

## 2012-03-23 DIAGNOSIS — M25569 Pain in unspecified knee: Secondary | ICD-10-CM

## 2012-03-23 DIAGNOSIS — J029 Acute pharyngitis, unspecified: Secondary | ICD-10-CM

## 2012-03-23 DIAGNOSIS — F329 Major depressive disorder, single episode, unspecified: Secondary | ICD-10-CM

## 2012-03-23 DIAGNOSIS — R0982 Postnasal drip: Secondary | ICD-10-CM

## 2012-03-23 LAB — POCT RAPID STREP A (OFFICE): Rapid Strep A Screen: NEGATIVE

## 2012-03-23 MED ORDER — FLUTICASONE PROPIONATE 50 MCG/ACT NA SUSP: 2.0000 | Freq: Every day | NASAL | Status: DC

## 2012-03-23 MED ORDER — LORAZEPAM 1 MG PO TABS: ORAL_TABLET | ORAL | Status: DC

## 2012-03-23 MED ORDER — DULOXETINE HCL 30 MG PO CPEP: 30.0000 mg | ORAL_CAPSULE | Freq: Every day | ORAL | Status: DC

## 2012-03-23 NOTE — Progress Notes (Signed)
  Subjective:    Patient ID: Alyssa Jefferson, female    DOB: 06-30-1946, 66 y.o.   MRN: [id number]  HPI patient enters with persistent pain in her left knee. She recently had films done here and showed severe degenerative arthritis in the knee. She has been prescribed naproxen and Mobic but continues to have significant pain and was prescribed Flexeril that made her very drowsy so she is hesitant to take any more of this.    Review of Systems     Objective:   Physical Exam  Constitutional: She appears well-developed and well-nourished.  Neck: Normal range of motion. No thyromegaly present.  Cardiovascular: Normal rate and regular rhythm.   Musculoskeletal:       There is significant degenerative arthritis involving the left knee. There's crepitation with flexion of the knee.    Results for orders placed in visit on 03/23/12  POCT RAPID STREP A (OFFICE)      Component Value Range   Rapid Strep A Screen Negative  Negative        Assessment & Plan:    Impression severe degenerative arthritis involving her left knee. Orthopedic referral made for this problem. We'll try Flonase to stop postnasal drip and see if this will help with her sore throat

## 2012-04-08 ENCOUNTER — Other Ambulatory Visit: Payer: Self-pay | Admitting: Physician Assistant

## 2012-05-03 ENCOUNTER — Other Ambulatory Visit: Payer: Self-pay | Admitting: Physician Assistant

## 2012-06-09 ENCOUNTER — Telehealth: Payer: Self-pay

## 2012-06-09 NOTE — Telephone Encounter (Signed)
Pt called and wants dr Cleta Alberts to know symbalta is not working and wants to know if something else, that is not as expensive,  be prescribed. Please call pt at [phone]

## 2012-06-09 NOTE — Telephone Encounter (Signed)
Please call patient and let her know she should come in to see me in the office and discuss what alternatives we have to treat her depression.

## 2012-06-09 NOTE — Telephone Encounter (Signed)
Advised pt that Dr Cleta Alberts would like her to come in to discuss options. Pt agreed.

## 2012-06-10 ENCOUNTER — Other Ambulatory Visit: Payer: Self-pay | Admitting: Physician Assistant

## 2012-06-12 ENCOUNTER — Ambulatory Visit (INDEPENDENT_AMBULATORY_CARE_PROVIDER_SITE_OTHER): Payer: Medicare Other | Admitting: Family Medicine

## 2012-06-12 VITALS — BP 124/82 | HR 75 | Temp 98.1°F | Resp 20 | Ht 64.75 in | Wt 238.6 lb

## 2012-06-12 DIAGNOSIS — E039 Hypothyroidism, unspecified: Secondary | ICD-10-CM

## 2012-06-12 DIAGNOSIS — L292 Pruritus vulvae: Secondary | ICD-10-CM

## 2012-06-12 DIAGNOSIS — L293 Anogenital pruritus, unspecified: Secondary | ICD-10-CM

## 2012-06-12 DIAGNOSIS — I10 Essential (primary) hypertension: Secondary | ICD-10-CM

## 2012-06-12 DIAGNOSIS — E7800 Pure hypercholesterolemia, unspecified: Secondary | ICD-10-CM

## 2012-06-12 MED ORDER — BUSPIRONE HCL 5 MG PO TABS: 5.0000 mg | ORAL_TABLET | Freq: Two times a day (BID) | ORAL | Status: DC

## 2012-06-12 MED ORDER — LOSARTAN POTASSIUM 100 MG PO TABS: 100.0000 mg | ORAL_TABLET | Freq: Every day | ORAL | Status: DC

## 2012-06-12 MED ORDER — CLOTRIMAZOLE-BETAMETHASONE 1-0.05 % EX CREA: TOPICAL_CREAM | Freq: Two times a day (BID) | CUTANEOUS | Status: DC

## 2012-06-12 NOTE — Patient Instructions (Signed)
Try taking the naproxen with food before bed every night to help with the left ear pain and make sure you see a dentist asap to see if your tooth could be contributing to the pain.  Plantar Fasciitis Plantar fasciitis is a common condition that causes foot pain. It is soreness (inflammation) of the band of tough fibrous tissue on the bottom of the foot that runs from the heel bone (calcaneus) to the ball of the foot. The cause of this soreness may be from excessive standing, poor fitting shoes, running on hard surfaces, being overweight, having an abnormal walk, or overuse (this is common in runners) of the painful foot or feet. It is also common in aerobic exercise dancers and ballet dancers. SYMPTOMS  Most people with plantar fasciitis complain of:  Severe pain in the morning on the bottom of their foot especially when taking the first steps out of bed. This pain recedes after a few minutes of walking.  Severe pain is experienced also during walking following a long period of inactivity.  Pain is worse when walking barefoot or up stairs DIAGNOSIS   Your caregiver will diagnose this condition by examining and feeling your foot.  Special tests such as X-rays of your foot, are usually not needed. PREVENTION   Consult a sports medicine professional before beginning a new exercise program.  Walking programs offer a good workout. With walking there is a lower chance of overuse injuries common to runners. There is less impact and less jarring of the joints.  Begin all new exercise programs slowly. If problems or pain develop, decrease the amount of time or distance until you are at a comfortable level.  Wear good shoes and replace them regularly.  Stretch your foot and the heel cords at the back of the ankle (Achilles tendon) both before and after exercise.  Run or exercise on even surfaces that are not hard. For example, asphalt is better than pavement.  Do not run barefoot on hard  surfaces.  If using a treadmill, vary the incline.  Do not continue to workout if you have foot or joint problems. Seek professional help if they do not improve. HOME CARE INSTRUCTIONS   Avoid activities that cause you pain until you recover.  Use ice or cold packs on the problem or painful areas after working out.  Only take over-the-counter or prescription medicines for pain, discomfort, or fever as directed by your caregiver.  Soft shoe inserts or athletic shoes with air or gel sole cushions may be helpful.  If problems continue or become more severe, consult a sports medicine caregiver or your own health care provider. Cortisone is a potent anti-inflammatory medication that may be injected into the painful area. You can discuss this treatment with your caregiver. MAKE SURE YOU:   Understand these instructions.  Will watch your condition.  Will get help right away if you are not doing well or get worse. Document Released: 04/27/2001 Document Revised: 10/25/2011 Document Reviewed: 06/26/2008 [facility] Patient Information 2013 [place], [place].

## 2012-06-12 NOTE — Progress Notes (Signed)
Subjective:    Patient ID: Alyssa Jefferson, female    DOB: 10-31-1945, 66 y.o.   MRN: [id number]  HPI  Pain in left ear, can't sleep on side of left head and radiates into left ear.  Getting worse, Has not tried any treatment. Now happening every single night but only if she is laying on the side of her head, is not tender to touch.  On paper chart review - it appears that she has been c/o of L ear pain and L sided head pain for > 4 yrs intermittently.  Still with itching on labia - and in the hair.  Tried some medication which helps temporarily - a cream she was given a sample of.  Has seen Dr. Cleta Alberts who referred her to gyn - do not remember if she was given any treatment from them.  The diflucan she tried over a yr ago did not help.  Has plantar fasciitis - gave med ointment for knee - rubbed into heal which helped some.  Tried some vics vaporub which helped intially but stopped.    Also requests a refill on her BP medication.  Has been on lexapro, zoloft, klonopin.  Is still taking ativan which does help some.  Wants to stop her cymbalta as making her more moodiness - crying more - and causing crackeling sound in her ears.  Has glaucoma in right eye, now is having some vision changes in her left eye.  She is due to her f/u optho exam  She is long overdue to her dental exam.  Can't take crestor or lipitor due to itchiness.  Past Medical History  Diagnosis Date  . High cholesterol   . Hypertension   . Thyroid disease     pt unsure if hypo or hyper  . Depression   . Cancer 2004    breast   Current Outpatient Prescriptions on File Prior to Visit  Medication Sig Dispense Refill  . albuterol (PROVENTIL HFA;VENTOLIN HFA) 108 (90 BASE) MCG/ACT inhaler Inhale 2 puffs into the lungs every 4 (four) hours as needed for wheezing.  1 Inhaler  0  . fluticasone (FLONASE) 50 MCG/ACT nasal spray Place 2 sprays into the nose daily.  16 g  6  . latanoprost (XALATAN) 0.005 % ophthalmic solution Place 1  drop into both eyes at bedtime.      Marland Kitchen levothyroxine (SYNTHROID, LEVOTHROID) 100 MCG tablet Take 1 tablet (100 mcg total) by mouth daily.  90 tablet  1  . LORazepam (ATIVAN) 1 MG tablet Patient to take one tablet as needed for stress.  60 tablet  3  . losartan (COZAAR) 100 MG tablet Take 1 tablet (100 mg total) by mouth daily.  90 tablet  1  . meloxicam (MOBIC) 15 MG tablet Take 1 tablet (15 mg total) by mouth daily.  30 tablet  0  . naproxen (NAPROSYN) 500 MG tablet Take 1 tablet (500 mg total) by mouth 2 (two) times daily with a meal.  30 tablet  0     Review of Systems  Constitutional: Negative for fever, chills and fatigue.  HENT: Positive for ear pain and dental problem. Negative for hearing loss, sore throat, facial swelling, mouth sores, neck pain, neck stiffness, tinnitus and ear discharge.   Eyes: Negative for visual disturbance.  Genitourinary: Negative for vaginal bleeding, vaginal discharge, genital sores, vaginal pain and pelvic pain.  Musculoskeletal: Positive for arthralgias and gait problem. Negative for joint swelling.  Skin: Positive for rash.  Neurological:  Positive for headaches. Negative for syncope and numbness.  Hematological: Negative for adenopathy.  Psychiatric/Behavioral: Positive for disturbed wake/sleep cycle.      BP 124/82  Pulse 75  Temp 98.1 F (36.7 C) (Oral)  Resp 20  Ht 5' 4.75" (1.645 m)  Wt 238 lb 9.6 oz (108.228 kg)  BMI 40.01 kg/m2  SpO2 98% Objective:   Physical Exam  Constitutional: She is oriented to person, place, and time. She appears well-developed and well-nourished. No distress.  HENT:  Head: Normocephalic and atraumatic. No trismus in the jaw.  Right Ear: Tympanic membrane, external ear and ear canal normal.  Left Ear: Tympanic membrane, external ear and ear canal normal.  Mouth/Throat: Uvula is midline, oropharynx is clear and moist and mucous membranes are normal. No oropharyngeal exudate, posterior oropharyngeal erythema or  tonsillar abscesses.  Eyes: Conjunctivae normal are normal. No scleral icterus.  Neck: Normal range of motion. Neck supple. No thyromegaly present.  Cardiovascular: Normal rate, regular rhythm, normal heart sounds and intact distal pulses.   Pulmonary/Chest: Effort normal and breath sounds normal. No respiratory distress.  Genitourinary: Vagina normal. There is no rash, tenderness or lesion on the right labia. There is no rash, tenderness or lesion on the left labia. No erythema around the vagina. No vaginal discharge found.  Lymphadenopathy:    She has no cervical adenopathy.  Neurological: She is alert and oriented to person, place, and time. No cranial nerve deficit. She exhibits normal muscle tone.  Skin: Skin is warm and dry. She is not diaphoretic.  Psychiatric: She has a normal mood and affect. Her behavior is normal.      Results for orders placed in visit on 06/12/12  COMPREHENSIVE METABOLIC PANEL      Component Value Range   Sodium 139  135 - 145 mEq/L   Potassium 4.2  3.5 - 5.3 mEq/L   Chloride 104  96 - 112 mEq/L   CO2 30  19 - 32 mEq/L   Glucose, Bld 74  70 - 99 mg/dL   BUN 17  6 - 23 mg/dL   Creat 1.61 (*) 0.96 - 1.10 mg/dL   Total Bilirubin 0.7  0.3 - 1.2 mg/dL   Alkaline Phosphatase 92  39 - 117 U/L   AST 13  0 - 37 U/L   ALT 12  0 - 35 U/L   Total Protein 7.7  6.0 - 8.3 g/dL   Albumin 4.3  3.5 - 5.2 g/dL   Calcium 9.8  8.4 - 04.5 mg/dL  LIPID PANEL      Component Value Range   Cholesterol 393 (*) 0 - 200 mg/dL   Triglycerides 409 (*) <150 mg/dL   HDL 67  >81 mg/dL   Total CHOL/HDL Ratio 5.9     VLDL 31  0 - 40 mg/dL   LDL Cholesterol 191 (*) 0 - 99 mg/dL  TSH      Component Value Range   TSH 1.245  0.350 - 4.500 uIU/mL    Assessment & Plan:  1. L parietal pain - unsure of etiology as nothing found on exam, try taking naprosyn qhs empirically. If doesn't help, rec f/u w/ dentist to ensure teeth not contributing. . . . may be from TMJ? consider trying otc  bite guard qhs if it cont? 2. Plantar fasciitis - reviewed home exercises of stretching and icing. If cont, cons qhs boot vs podiatry referra. 3. Vulvovaginal pruritis - pt used lotrison in the past - will continue 4. Hypothyroid -  check tsh, cont levothyroxine 100 5. HPL - check lipid panel.  crestor 20 stopped as it caused itching but may need to try another statin due to sig high chol 6. HTN - well controlled, cont loartan 100 7. Depression/anxiety - failed zoloft, lexapro, cymbalta, klonopin. Ok to cont prn ativan but really needs to be on a maintenance med as well. Try buspar 5 bid - low dose so will likely need to titrate up.

## 2012-06-13 LAB — COMPREHENSIVE METABOLIC PANEL
ALT: 12 U/L (ref 0–35)
AST: 13 U/L (ref 0–37)
Albumin: 4.3 g/dL (ref 3.5–5.2)
Alkaline Phosphatase: 92 U/L (ref 39–117)
BUN: 17 mg/dL (ref 6–23)
CO2: 30 mEq/L (ref 19–32)
Calcium: 9.8 mg/dL (ref 8.4–10.5)
Chloride: 104 mEq/L (ref 96–112)
Creat: 1.14 mg/dL — ABNORMAL HIGH (ref 0.50–1.10)
Glucose, Bld: 74 mg/dL (ref 70–99)
Potassium: 4.2 mEq/L (ref 3.5–5.3)
Sodium: 139 mEq/L (ref 135–145)
Total Bilirubin: 0.7 mg/dL (ref 0.3–1.2)
Total Protein: 7.7 g/dL (ref 6.0–8.3)

## 2012-06-13 LAB — LIPID PANEL
Cholesterol: 393 mg/dL — ABNORMAL HIGH (ref 0–200)
HDL: 67 mg/dL (ref >39)
LDL Cholesterol: 295 mg/dL — ABNORMAL HIGH (ref 0–99)
Total CHOL/HDL Ratio: 5.9 Ratio
Triglycerides: 155 mg/dL — ABNORMAL HIGH (ref <150)
VLDL: 31 mg/dL (ref 0–40)

## 2012-06-13 LAB — TSH: TSH: 1.245 u[IU]/mL (ref 0.350–4.500)

## 2012-07-04 ENCOUNTER — Telehealth: Payer: Self-pay

## 2012-07-04 MED ORDER — FLUOXETINE HCL 20 MG PO TABS: 20.0000 mg | ORAL_TABLET | Freq: Every day | ORAL | Status: DC

## 2012-07-04 MED ORDER — ATORVASTATIN CALCIUM 80 MG PO TABS: 80.0000 mg | ORAL_TABLET | Freq: Every day | ORAL | Status: DC

## 2012-07-04 NOTE — Telephone Encounter (Signed)
Also, when I was looking at pt's chart to address her anxiety meds, I realized that I had never let her know about her lab results - VERY VERY sorry about this.  Her bad cholesterol (LDL) was MUCH to high - it was 295 and needs to be <130 therefore she needs to start on a cholesterol medication - atorvastatin 80mg  to take every night - which I sent to her pharmacy.  When she follows up in 4-6 wks, we will check her liver to ensure she is not having any side effects and is tolerating this medication but does NOT need to be fasting. We will recheck on her cholesterol in 4-6 mos.

## 2012-07-04 NOTE — Telephone Encounter (Signed)
PT STATES THE MEDICINE SHE WAS GIVEN ISN'T WORKING. WOULD LIKE TO SPEAK WITH SOMEONE ABOUT CHANGING HER MEDS PLEASE CALL [phone]    [facility] [address]

## 2012-07-04 NOTE — Telephone Encounter (Signed)
crestor caused itching - that's what she reported at our last visit. Try taking it at night along with benadryl. There really isn't any other great option for cholesterol considering hers is so high. If she doesn't want to try lipitor, she needs to come in to discuss the other options as all have side effects or inconvenient dosing and will definitely need to be on multiple chol meds so it really isn't something that can be addressed over the phone.

## 2012-07-04 NOTE — Telephone Encounter (Signed)
Because pt has failed several other medications, there is no easy or quick answer for this pt. If she wants the best care, she should come in for further evaluation so we can obtain the details of her symptoms and document where she currently is on diagnostic scales such as Becks, PHQ-9, and GAD-7. However, if she doesn't want to come in and just wants [place] to try something different for depression and anxiety than what has failed for her in the past, you can call in some prozac 20mg  1 tab po qd. Disp 30, Ref 1. F/u in 4-6 to reassess, see if this is the right med for her, see if she needs a dose increase. She may also need a refill on her prn ativan at that time which needs to be done at an OV since it is a controlled substance. Many people who haven't tolerated other meds do fantastic on the prozac as it is very effective for both depression and anxiety so this shoulder work for her. However, if it doesn't I would strongly recommend that she consider letting us refer to her to a psychiatrist.

## 2012-07-04 NOTE — Telephone Encounter (Signed)
She indicates she can not come in at this time,  the prozac was sent in for her, she knows she needs a follow up in the next 4-6 weeks. She also is advised of the Cholesterol meds, she advised Lipitor caused her itching please advise what you would like to change this to.

## 2012-07-04 NOTE — Telephone Encounter (Signed)
Patient states Buspar is making her angry and hostile, wants to change to alternate medication. Please advise.

## 2012-07-04 NOTE — Telephone Encounter (Signed)
Dr. Clelia Croft, It looks like this patient is complicated, and has tried several medications previously.  Please advise (send your response to the clinical message pool).

## 2012-07-05 NOTE — Telephone Encounter (Signed)
LM w/female for pt to call back.

## 2012-07-10 NOTE — Telephone Encounter (Signed)
LMOM to CB. 

## 2012-07-10 NOTE — Telephone Encounter (Signed)
Called pt and explained Dr Alver Fisher advice to try taking Lipitor at night w/Benedryl. Pt reported that she has never had hives or any resp SEs from taking the Lipitor and agrees to try to take along w/Benedryl. Pt will RTC to discuss other options if she still is not be able to tolerate.

## 2012-07-10 NOTE — Telephone Encounter (Signed)
Pt called back, clinical could not take call at time.  Best number for pt [phone]

## 2012-08-03 ENCOUNTER — Ambulatory Visit: Payer: Medicare Other

## 2012-08-03 ENCOUNTER — Ambulatory Visit (INDEPENDENT_AMBULATORY_CARE_PROVIDER_SITE_OTHER): Payer: Medicare Other | Admitting: Emergency Medicine

## 2012-08-03 VITALS — BP 174/76 | HR 66 | Temp 98.3°F | Resp 18 | Ht 62.5 in | Wt 238.8 lb

## 2012-08-03 DIAGNOSIS — E785 Hyperlipidemia, unspecified: Secondary | ICD-10-CM

## 2012-08-03 DIAGNOSIS — R0981 Nasal congestion: Secondary | ICD-10-CM

## 2012-08-03 DIAGNOSIS — J329 Chronic sinusitis, unspecified: Secondary | ICD-10-CM

## 2012-08-03 DIAGNOSIS — J3489 Other specified disorders of nose and nasal sinuses: Secondary | ICD-10-CM

## 2012-08-03 DIAGNOSIS — R059 Cough, unspecified: Secondary | ICD-10-CM

## 2012-08-03 DIAGNOSIS — R0982 Postnasal drip: Secondary | ICD-10-CM

## 2012-08-03 DIAGNOSIS — R002 Palpitations: Secondary | ICD-10-CM

## 2012-08-03 DIAGNOSIS — R05 Cough: Secondary | ICD-10-CM

## 2012-08-03 MED ORDER — FLUTICASONE PROPIONATE 50 MCG/ACT NA SUSP: 2.0000 | Freq: Every day | NASAL | Status: DC

## 2012-08-03 MED ORDER — BENZONATATE 100 MG PO CAPS: 100.0000 mg | ORAL_CAPSULE | Freq: Three times a day (TID) | ORAL | Status: DC | PRN

## 2012-08-03 MED ORDER — AMOXICILLIN 875 MG PO TABS: 875.0000 mg | ORAL_TABLET | Freq: Two times a day (BID) | ORAL | Status: DC

## 2012-08-03 NOTE — Progress Notes (Signed)
  Subjective:    Patient ID: Alyssa Jefferson, female    DOB: 12-05-1945, 66 y.o.   MRN: [id number]  HPI day she has not felt well since Thanksgiving. She started with an upper respiratory infection and then has subsequently developed a at times yellowish drainage postnasal drip associated with a cough. She has hot and cold spells but no definite fever. She has a cough but it is mainly nonproductive. She's had no shortness of breath with this. She has been on Lipitor and past for high cholesterol but was "allergic to that with rash. She states she can take Crestor and has never had difficulty with this drug. She also is questioning whether she does have heart disease. She's had some palpitations off and on but has never had chest pain. She used to smoke but quit years ago    Review of Systems     Objective:   Physical Exam HEENT exam reveals TMs to be clear. Nose is congested. There is tenderness over both maxillary sinuses. Neck is supple chest is clear to auscultation and percussion. [facility] reading (PRIMARY) by  Dr.Daub chest x-ray shows clips in the left axillary area but no infiltrates. Waters' view reveals thickening of both inferior maxillary sinuses with no air-fluid levels.  EKG normal sinus rhythm biphasic T. present in V3       Assessment & Plan:  We'll go ahead and treat for sinusitis. We'll place her on Flonase ,Tessalon Perles for cough, and amoxicillin. I would like to hold off starting her on a statin at the present time. I do think he would be a good idea to get a cardiology evaluation. She is over 55 she has high blood pressure and significant hyperlipidemia and has been a smoker in the past. I did not place her on Crestor yet I would like her to have a cardiology evaluation first and if that checks out well we can cut back on Crestor since she has taken Naprosyn for without difficulty even though she has had allergic type reactions to other statins.

## 2012-08-13 ENCOUNTER — Other Ambulatory Visit: Payer: Self-pay | Admitting: Radiology

## 2012-08-13 NOTE — Telephone Encounter (Signed)
rec'd fax from pharmacy for Crestor 10mg , however patient is now taking LIpitor faxed to pharmacy to advise.

## 2012-08-14 ENCOUNTER — Telehealth: Payer: Self-pay

## 2012-08-14 NOTE — Telephone Encounter (Signed)
PT STATES SHE WAS GIVEN SOME MEDICINE FOR HER NERVES AND IT ISN'T WORKING. NEED TO HAVE SOMETHING ELSE PLEASE CALL PT AT [phone]    [facility] [address]

## 2012-08-15 NOTE — Telephone Encounter (Signed)
Dr Cleta Alberts, it looks like you Rxd Ativan for pt at 03/23/12 OV. I don't know if you d/w her at OV in Dec or not, but do you want to Rx something else for pt?

## 2012-08-15 NOTE — Telephone Encounter (Signed)
Please call Alyssa Jefferson. and tell her I would like her to come in and talk to me about what is going on in her life and making her depressed. I will be here Friday Saturday and Sunday we can  sit down with her and see if we can find a medication that may be able to help her with her anxiety and depression.

## 2012-08-15 NOTE — Telephone Encounter (Signed)
Advised pt that Dr Cleta Alberts would like to see her and gave her his schedule for this weekend. Pt agreed to come in.

## 2012-09-14 ENCOUNTER — Telehealth: Payer: Self-pay | Admitting: Emergency Medicine

## 2012-09-14 ENCOUNTER — Ambulatory Visit: Payer: Medicare Other

## 2012-09-14 ENCOUNTER — Ambulatory Visit (INDEPENDENT_AMBULATORY_CARE_PROVIDER_SITE_OTHER): Payer: Medicare Other | Admitting: Emergency Medicine

## 2012-09-14 VITALS — BP 153/82 | HR 68 | Temp 97.4°F | Resp 16 | Ht 62.0 in | Wt 235.0 lb

## 2012-09-14 DIAGNOSIS — M542 Cervicalgia: Secondary | ICD-10-CM

## 2012-09-14 DIAGNOSIS — F32A Depression, unspecified: Secondary | ICD-10-CM

## 2012-09-14 DIAGNOSIS — F329 Major depressive disorder, single episode, unspecified: Secondary | ICD-10-CM

## 2012-09-14 MED ORDER — FLUOXETINE HCL 20 MG PO TABS: ORAL_TABLET | ORAL | Status: DC

## 2012-09-14 NOTE — Telephone Encounter (Signed)
Please disregard the previous message. She should take the Mobic only for one week because of her hypertension not because her previous surgeries and she has mildly diminished renal function.

## 2012-09-14 NOTE — Progress Notes (Deleted)
  Subjective:    Patient ID: Alyssa Jefferson, female    DOB: 07/24/1946, 67 y.o.   MRN: [id number]  HPI          Review of Systems     Objective:   Physical Exam        Assessment & Plan:

## 2012-09-14 NOTE — Telephone Encounter (Signed)
Tried to call pt and her mailbox was full. Be sure to let pt know that she is only to take the Mobic for 1 week DUE TO diminished renal function, NOT because she has had a kidney removed, but ask pt if she has had kidney surgery in the past, the records are unclear.

## 2012-09-14 NOTE — Telephone Encounter (Signed)
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

## 2012-09-14 NOTE — Progress Notes (Signed)
  Subjective:    Patient ID: Alyssa Jefferson, female    DOB: [birth year], 67 y.o.   MRN: [id number]  HPI patient here and just left the cardiology office where she was evaluated by an echocardiogram. 10 days ago she had studies done on her left carotid. He enters today with pain in the left side of her throat. She has some mild pain on the right side of her neck in the back. She has no radicular symptoms down either arm. Patient also states she's been under quite a bit distress and would like to increase her Prozac to 30 mg daily. Patient also continues to have a rash on the left side of her neck though it is somewhat improved with steroid cream .    Review of Systems     Objective:   Physical Exam patient is alert and cooperative and in no distress. Her chest is clear to ossification percussion. There is tenderness over the left carotid bulb. The right carotid is normal there are no carotid bruits heard her cardiac exam is in remarkable. Cardiac exam is unremarkable. There is a reddish raised rash on the left side of the neck .  [facility] reading (PRIMARY) by  Dr.Daub C-spine films showed degenerative changes C6 and C7. There are clips also visible in the neck from previous thyroid surgery.      Assessment & Plan:  We'll check films of her neck and get results of her carotid Doppler study. Doppler films that were done were normal. She is to take MOBIC 15 mg one a day for 7 days. I did increase her Prozac to 30 mg daily at her request. She has a steroid cream she can use on the rash on her neck.

## 2012-09-18 NOTE — Telephone Encounter (Signed)
Thanks I called her. Mail box is full, unable to leave message, will try again.

## 2012-09-20 NOTE — Telephone Encounter (Signed)
I have been unsuccessful calling her, have mailed letter to her home. Amy FYI

## 2012-10-16 ENCOUNTER — Encounter: Payer: Self-pay | Admitting: Pulmonary Disease

## 2012-10-16 ENCOUNTER — Ambulatory Visit (INDEPENDENT_AMBULATORY_CARE_PROVIDER_SITE_OTHER): Payer: Medicare Other | Admitting: Pulmonary Disease

## 2012-10-16 VITALS — BP 134/72 | HR 66 | Temp 97.8°F | Ht 62.0 in | Wt 236.6 lb

## 2012-10-16 DIAGNOSIS — G4733 Obstructive sleep apnea (adult) (pediatric): Secondary | ICD-10-CM

## 2012-10-16 NOTE — Progress Notes (Signed)
Chief Complaint  Patient presents with  . Advice Only    c/o snoring, waing up all the time at night, feeling tired during the day, has been told she stopped breathing. She had sleep study before and was dx w/ OSA but never used CPAP. Last seen [facility] 2007.     History of Present Illness: Alyssa Jefferson is a 67 y.o. female for evaluation of obstructive sleep apnea.  She was diagnosed with sleep apnea after sleep study 07/27/04 which showed severe sleep apnea.  She was previously followed by Dr. Marcelyn Bruins.  She reports never using CPAP, but in review of records she may have received CPAP in 2007.  She didn't realize how sleep apnea can affect her health.  She was seen recently by Dr. Jacinto Halim for her hypertension and chest pain.  During this evaluation there was concerns about her breathing pattern with sleep, and she was referred to pulmonary/sleep medicine.  She still snores while asleep.  She will have trouble sleeping on her back.  She goes to bed at 11 pm.  She does not use anything to help her sleep.  She wakes up several times during the night.  She gets out of bed at 9 am.  She feels tired in the morning, and can get headaches.  She does not use anything to help her stay awake.  She can fall asleep easily when sitting quiet.  She will talk in her sleep sometimes.  The patient denies sleep walking, bruxism, or nightmares.  There is no history of restless legs.  The patient denies sleep hallucinations, sleep paralysis, or cataplexy.  His Epworth score is 5 out 24.  Tests: PSG 07/27/04 >> AHI 59.7, SpO2 low 64%  Alyssa Jefferson  has a past medical history of High cholesterol; Hypertension; Thyroid disease; Depression; Cancer (2004); and OSA (obstructive sleep apnea).  Alyssa Jefferson  has past surgical history that includes Breast surgery (2004); Abdominal hysterectomy (82); and thyroid removed.  Prior to Admission medications   Medication Sig Start Date End Date Taking? Authorizing Provider   aspirin 81 MG tablet Take 81 mg by mouth daily.   Yes Historical Provider, MD  FLUoxetine (PROZAC) 20 MG tablet Take 30 mg by mouth daily. 09/14/12  Yes Collene Gobble, MD  latanoprost (XALATAN) 0.005 % ophthalmic solution Place 1 drop into both eyes at bedtime.   Yes Historical Provider, MD  levothyroxine (SYNTHROID, LEVOTHROID) 100 MCG tablet Take 1 tablet (100 mcg total) by mouth daily. 01/08/12 01/07/13 Yes Marzella Schlein McClung, PA-C  losartan (COZAAR) 100 MG tablet Take 1 tablet (100 mg total) by mouth daily. 06/12/12  Yes Sherren Mocha, MD  rosuvastatin (CRESTOR) 20 MG tablet Take 20 mg by mouth daily.   Yes Historical Provider, MD    Allergies  Allergen Reactions  . Codeine   . Lipitor (Atorvastatin Calcium)   . Statins     family history includes Breast cancer in her sister; Lung cancer in her father; and Ovarian cancer in her sister.   reports that she quit smoking about 34 years ago. Her smoking use included Cigarettes. She smoked 0.00 packs per day for 20 years. She has never used smokeless tobacco. She reports that she does not drink alcohol or use illicit drugs.  Review of Systems  Constitutional: Positive for unexpected weight change. Negative for appetite change.  HENT: Negative for ear pain, congestion, sore throat, sneezing, trouble swallowing and dental problem.   Respiratory: Negative for cough and  shortness of breath.   Cardiovascular: Negative for chest pain, palpitations and leg swelling.  Gastrointestinal: Negative for abdominal pain.  Musculoskeletal: Negative for joint swelling.  Skin: Negative for rash.  Neurological: Positive for headaches.  Psychiatric/Behavioral: Positive for dysphoric mood. The patient is not nervous/anxious.    Physical Exam:  General - No distress ENT - No sinus tenderness, triangular uvula, 2+ tonsils, MP 3, no oral exudate, no LAN, no thyromegaly, TM clear, pupils equal/reactive Cardiac - s1s2 regular, no murmur, pulses symmetric Chest - No  wheeze/rales/dullness, good air entry, normal respiratory excursion Back - No focal tenderness Abd - Soft, non-tender, no organomegaly, + bowel sounds Ext - No edema Neuro - Normal strength, cranial nerves intact Skin - No rashes Psych - Normal mood, and behavior  Assessment/Plan:  Coralyn Helling, MD [place] [facility]/Critical Care/Sleep Pager:  [phone] 10/16/2012, 10:35 AM

## 2012-10-16 NOTE — Assessment & Plan Note (Signed)
She has prior history of sleep apnea.  She has history of hypertension and depression.  She continues to have snoring, sleep disruption, and daytime sleepiness.  I am concerned she still has sleep apnea.  I have reviewed her sleep test results from 2005 with the patient.  Explained how sleep apnea can affect the patient's health.  Driving precautions and importance of weight loss were discussed.  Treatment options for sleep apnea were reviewed.  Will arrange for home sleep study to further assess.  Will likely then proceed with auto CPAP set up.

## 2012-10-16 NOTE — Progress Notes (Deleted)
  Subjective:    Patient ID: Alyssa Jefferson, female    DOB: Dec 31, 1945, 67 y.o.   MRN: [id number]  HPI    Review of Systems  Constitutional: Positive for unexpected weight change. Negative for appetite change.  HENT: Negative for ear pain, congestion, sore throat, sneezing, trouble swallowing and dental problem.   Respiratory: Negative for cough and shortness of breath.   Cardiovascular: Negative for chest pain, palpitations and leg swelling.  Gastrointestinal: Negative for abdominal pain.  Musculoskeletal: Negative for joint swelling.  Skin: Negative for rash.  Neurological: Positive for headaches.  Psychiatric/Behavioral: Positive for dysphoric mood. The patient is not nervous/anxious.        Objective:   Physical Exam        Assessment & Plan:

## 2012-10-16 NOTE — Patient Instructions (Signed)
Will arrange for home sleep study Will call to arrange for follow up after sleep study reviewed  

## 2012-10-18 ENCOUNTER — Ambulatory Visit: Payer: Medicare Other | Admitting: Pulmonary Disease

## 2012-10-18 ENCOUNTER — Ambulatory Visit (INDEPENDENT_AMBULATORY_CARE_PROVIDER_SITE_OTHER): Payer: Medicare Other | Admitting: Pulmonary Disease

## 2012-10-18 DIAGNOSIS — G4733 Obstructive sleep apnea (adult) (pediatric): Secondary | ICD-10-CM

## 2012-10-26 ENCOUNTER — Telehealth: Payer: Self-pay | Admitting: Pulmonary Disease

## 2012-10-26 NOTE — Telephone Encounter (Signed)
HST 10/18/12 >> AHI 29.6, SpO2 low 73%.  Will have my nurse schedule ROV to review results.

## 2012-10-27 ENCOUNTER — Telehealth: Payer: Self-pay | Admitting: Pulmonary Disease

## 2012-10-27 NOTE — Telephone Encounter (Signed)
Pt is scheduled to see VS 11/07/12. Nothing further was needed

## 2012-10-27 NOTE — Telephone Encounter (Signed)
Duplicate message. See phone 10/26/12.

## 2012-11-01 ENCOUNTER — Encounter: Payer: Self-pay | Admitting: Emergency Medicine

## 2012-11-06 ENCOUNTER — Encounter: Payer: Self-pay | Admitting: Emergency Medicine

## 2012-11-07 ENCOUNTER — Ambulatory Visit (INDEPENDENT_AMBULATORY_CARE_PROVIDER_SITE_OTHER): Payer: Medicare Other | Admitting: Pulmonary Disease

## 2012-11-07 ENCOUNTER — Encounter: Payer: Self-pay | Admitting: Pulmonary Disease

## 2012-11-07 VITALS — BP 140/68 | HR 57 | Temp 97.4°F | Ht 62.0 in | Wt 242.2 lb

## 2012-11-07 DIAGNOSIS — G4733 Obstructive sleep apnea (adult) (pediatric): Secondary | ICD-10-CM

## 2012-11-07 NOTE — Assessment & Plan Note (Signed)
She has moderate to severe sleep apnea.    I have reviewed her sleep test results with the patient.  Explained how sleep apnea can affect the patient's health.  Driving precautions and importance of weight loss were discussed.  Treatment options for sleep apnea were reviewed.  She is willing to try auto CPAP.  If this is unsuccessful, then would need to consider oral appliance.

## 2012-11-07 NOTE — Progress Notes (Signed)
Chief Complaint  Patient presents with  . Results    pt here to discuss sleep study results.     History of Present Illness: KINZLEY SAVELL is a 67 y.o. female with obstructive sleep apnea.  She is here to review her sleep study.  Tests: PSG 07/27/04 >> AHI 59.7, SpO2 low 64% HST 10/18/12 >> AHI 29.6, SpO2 low 73%.  Ernest Haber  has a past medical history of High cholesterol; Hypertension; Thyroid disease; Depression; Cancer (2004); and OSA (obstructive sleep apnea).  ENZLEY KITCHENS  has past surgical history that includes Breast surgery (2004); Abdominal hysterectomy (83); and thyroid removed.  Prior to Admission medications   Medication Sig Start Date End Date Taking? Authorizing Provider  aspirin 81 MG tablet Take 81 mg by mouth daily.   Yes Historical Provider, MD  FLUoxetine (PROZAC) 20 MG tablet Take 30 mg by mouth daily. 09/14/12  Yes Collene Gobble, MD  latanoprost (XALATAN) 0.005 % ophthalmic solution Place 1 drop into both eyes at bedtime.   Yes Historical Provider, MD  levothyroxine (SYNTHROID, LEVOTHROID) 100 MCG tablet Take 1 tablet (100 mcg total) by mouth daily. 01/08/12 01/07/13 Yes Marzella Schlein McClung, PA-C  losartan (COZAAR) 100 MG tablet Take 1 tablet (100 mg total) by mouth daily. 06/12/12  Yes Sherren Mocha, MD  rosuvastatin (CRESTOR) 20 MG tablet Take 20 mg by mouth daily.   Yes Historical Provider, MD    Allergies  Allergen Reactions  . Codeine   . Lipitor (Atorvastatin Calcium)   . Statins      Physical Exam:  General - No distress ENT - No sinus tenderness, triangular uvula, 2+ tonsils, MP 3, no oral exudate, no LAN Cardiac - s1s2 regular, no murmur Chest - No wheeze/rales/dullness, good air entry, normal respiratory excursion Back - No focal tenderness Abd - Soft, non-tender Ext - No edema Neuro - Normal strength, cranial nerves intact Skin - No rashes Psych - Normal mood, and behavior  Assessment/Plan:  Coralyn Helling, MD [place] [facility]/Critical  Care/Sleep Pager:  [phone] 11/07/2012, 1:55 PM

## 2012-11-07 NOTE — Addendum Note (Signed)
Addended by: Tommie Sams on: 11/07/2012 04:28 PM   Modules accepted: Orders

## 2012-11-07 NOTE — Patient Instructions (Signed)
Will arrange for CPAP set up Follow up in 8 weeks 

## 2012-11-09 ENCOUNTER — Encounter: Payer: Self-pay | Admitting: Emergency Medicine

## 2012-12-27 ENCOUNTER — Other Ambulatory Visit: Payer: Self-pay | Admitting: Family Medicine

## 2013-01-02 ENCOUNTER — Ambulatory Visit: Payer: Medicare Other | Admitting: Pulmonary Disease

## 2013-01-25 ENCOUNTER — Ambulatory Visit: Payer: Medicare Other | Admitting: Pulmonary Disease

## 2013-01-27 ENCOUNTER — Ambulatory Visit (INDEPENDENT_AMBULATORY_CARE_PROVIDER_SITE_OTHER): Payer: Medicare Other | Admitting: Family Medicine

## 2013-01-27 ENCOUNTER — Ambulatory Visit: Payer: Medicare Other

## 2013-01-27 VITALS — BP 137/73 | HR 69 | Temp 97.9°F | Resp 16 | Ht 62.0 in | Wt 234.0 lb

## 2013-01-27 DIAGNOSIS — R109 Unspecified abdominal pain: Secondary | ICD-10-CM

## 2013-01-27 DIAGNOSIS — N390 Urinary tract infection, site not specified: Secondary | ICD-10-CM

## 2013-01-27 DIAGNOSIS — K219 Gastro-esophageal reflux disease without esophagitis: Secondary | ICD-10-CM

## 2013-01-27 LAB — POCT UA - MICROSCOPIC ONLY
Bacteria, U Microscopic: NEGATIVE
Casts, Ur, LPF, POC: NEGATIVE
Crystals, Ur, HPF, POC: NEGATIVE
Yeast, UA: NEGATIVE

## 2013-01-27 LAB — POCT URINALYSIS DIPSTICK
Bilirubin, UA: NEGATIVE
Blood, UA: NEGATIVE
Glucose, UA: NEGATIVE
Ketones, UA: NEGATIVE
Nitrite, UA: NEGATIVE
Protein, UA: 30
Spec Grav, UA: 1.02
Urobilinogen, UA: 0.2
pH, UA: 5.5

## 2013-01-27 LAB — POCT CBC
Granulocyte percent: 63.5 %G (ref 37–80)
HCT, POC: 36.6 % — AB (ref 37.7–47.9)
Hemoglobin: 11.1 g/dL — AB (ref 12.2–16.2)
Lymph, poc: 2.1 (ref 0.6–3.4)
MCH, POC: 25.9 pg — AB (ref 27–31.2)
MCHC: 30.3 g/dL — AB (ref 31.8–35.4)
MCV: 85.3 fL (ref 80–97)
MID (cbc): 0.6 (ref 0–0.9)
MPV: 7 fL (ref 0–99.8)
POC Granulocyte: 4.6 (ref 2–6.9)
POC LYMPH PERCENT: 28.6 %L (ref 10–50)
POC MID %: 7.9 %M (ref 0–12)
Platelet Count, POC: 309 10*3/uL (ref 142–424)
RBC: 4.29 M/uL (ref 4.04–5.48)
RDW, POC: 16.3 %
WBC: 7.2 10*3/uL (ref 4.6–10.2)

## 2013-01-27 LAB — COMPREHENSIVE METABOLIC PANEL
ALT: 38 U/L — ABNORMAL HIGH (ref 0–35)
AST: 39 U/L — ABNORMAL HIGH (ref 0–37)
Albumin: 4.2 g/dL (ref 3.5–5.2)
Alkaline Phosphatase: 117 U/L (ref 39–117)
BUN: 15 mg/dL (ref 6–23)
CO2: 27 mEq/L (ref 19–32)
Calcium: 9 mg/dL (ref 8.4–10.5)
Chloride: 105 mEq/L (ref 96–112)
Creat: 1.13 mg/dL — ABNORMAL HIGH (ref 0.50–1.10)
Glucose, Bld: 86 mg/dL (ref 70–99)
Potassium: 3.6 mEq/L (ref 3.5–5.3)
Sodium: 139 mEq/L (ref 135–145)
Total Bilirubin: 1.1 mg/dL (ref 0.3–1.2)
Total Protein: 6.9 g/dL (ref 6.0–8.3)

## 2013-01-27 LAB — LIPASE: Lipase: 69 U/L (ref 0–75)

## 2013-01-27 MED ORDER — CIPROFLOXACIN HCL 250 MG PO TABS: 250.0000 mg | ORAL_TABLET | Freq: Two times a day (BID) | ORAL | Status: DC

## 2013-01-27 NOTE — Progress Notes (Signed)
 [facility]:  Office Visit  Chief Complaint:  Chief Complaint  Patient presents with  . Abdominal Pain  . Nausea    HPI: Alyssa Jefferson is a 67 y.o. female who complains of  midepigastric pain 1 hour after she ate potato chips and sippy punch, had chicken hours before. This occurred last night, she had 10/10 sharp pain radiating to both sides of her back.. Associated with nausea.  Denies v/bloating, fevers, chills, diarrhea, constipation. Has had BM, has had flatuss. She does not have pain now. She is UTD on her colonoscopy 2007, Hgb is baseline around 11 based on chart review. Did not try anything for this. No history of gallstones, kidney stones. No vaginal dc or UTI sxs   Past Medical History  Diagnosis Date  . High cholesterol   . Hypertension   . Thyroid disease     pt unsure if hypo or hyper  . Depression   . Cancer 2004    breast  . OSA (obstructive sleep apnea)   . Glaucoma    Past Surgical History  Procedure Laterality Date  . Breast surgery  2004    left breast removed due to cancer no chemo or radiation  . Abdominal hysterectomy  83    BSO  . Thyroid removed      due to knot   History   Social History  . Marital Status: Married    Spouse Name: N/A    Number of Children: N/A  . Years of Education: N/A   Occupational History  . retired    Social History Main Topics  . Smoking status: Former Smoker -- 20 years    Types: Cigarettes    Quit date: 08/16/1978  . Smokeless tobacco: Never Used     Comment: 1 pack per week--10/16/12  . Alcohol Use: No  . Drug Use: No  . Sexually Active: No   Other Topics Concern  . None   Social History Narrative  . None   Family History  Problem Relation Age of Onset  . Lung cancer Father   . Breast cancer Sister   . Ovarian cancer Sister    Allergies  Allergen Reactions  . Codeine   . Lipitor (Atorvastatin Calcium)   . Statins    Prior to Admission medications   Medication Sig Start Date  End Date Taking? Authorizing Provider  FLUoxetine (PROZAC) 20 MG tablet Take 30 mg by mouth daily. 09/14/12  Yes Collene Gobble, MD  latanoprost (XALATAN) 0.005 % ophthalmic solution Place 1 drop into both eyes at bedtime.   Yes Historical Provider, MD  losartan (COZAAR) 100 MG tablet Take 1 tablet (100 mg total) by mouth daily. PATIENT NEEDS OFFICE VISIT FOR ADDITIONAL REFILLS 12/27/12  Yes Morrell Riddle, PA-C  rosuvastatin (CRESTOR) 20 MG tablet Take 20 mg by mouth daily.   Yes Historical Provider, MD  levothyroxine (SYNTHROID, LEVOTHROID) 100 MCG tablet Take 1 tablet (100 mcg total) by mouth daily. 01/08/12 01/07/13  Anders Simmonds, PA-C     ROS: The patient denies fevers, chills, night sweats, unintentional weight loss, chest pain, palpitations, wheezing, dyspnea on exertion, vomiting,  hematuria, melena, numbness, weakness, or tingling.  All other systems have been reviewed and were otherwise negative with the exception of those mentioned in the HPI and as above.    PHYSICAL EXAM: Filed Vitals:   01/27/13 1430  BP: 137/73  Pulse: 69  Temp: 97.9 F (36.6 C)  Resp: 16  Filed Vitals:   01/27/13 1430  Height: 5\' 2"  (1.575 m)  Weight: 234 lb (106.142 kg)   Body mass index is 42.79 kg/(m^2).  General: Alert, no acute distress HEENT:  Normocephalic, atraumatic, oropharynx patent.  Cardiovascular:  Regular rate and rhythm, no rubs murmurs or gallops.  No Carotid bruits, radial pulse intact. No pedal edema.  Respiratory: Clear to auscultation bilaterally.  No wheezes, rales, or rhonchi.  No cyanosis, no use of accessory musculature GI: No organomegaly, abdomen is soft and non-tender, positive bowel sounds.  No masses. Skin: No rashes. Neurologic: Facial musculature symmetric. Psychiatric: Patient is appropriate throughout our interaction. Lymphatic: No cervical lymphadenopathy Musculoskeletal: Gait intact. No CVA tenderness   LABS: Results for orders placed in visit on 01/27/13   POCT CBC      Result Value Range   WBC 7.2  4.6 - 10.2 K/uL   Lymph, poc 2.1  0.6 - 3.4   POC LYMPH PERCENT 28.6  10 - 50 %L   MID (cbc) 0.6  0 - 0.9   POC MID % 7.9  0 - 12 %M   POC Granulocyte 4.6  2 - 6.9   Granulocyte percent 63.5  37 - 80 %G   RBC 4.29  4.04 - 5.48 M/uL   Hemoglobin 11.1 (*) 12.2 - 16.2 g/dL   HCT, POC 16.1 (*) 09.6 - 47.9 %   MCV 85.3  80 - 97 fL   MCH, POC 25.9 (*) 27 - 31.2 pg   MCHC 30.3 (*) 31.8 - 35.4 g/dL   RDW, POC 04.5     Platelet Count, POC 309  142 - 424 K/uL   MPV 7.0  0 - 99.8 fL  POCT UA - MICROSCOPIC ONLY      Result Value Range   WBC, Ur, HPF, POC 2-4     RBC, urine, microscopic 0-2     Bacteria, U Microscopic neg     Mucus, UA small     Epithelial cells, urine per micros 3-5     Crystals, Ur, HPF, POC neg     Casts, Ur, LPF, POC neg     Yeast, UA neg    POCT URINALYSIS DIPSTICK      Result Value Range   Color, UA yellow     Clarity, UA clear     Glucose, UA neg     Bilirubin, UA neg     Ketones, UA neg     Spec Grav, UA 1.020     Blood, UA neg     pH, UA 5.5     Protein, UA 30     Urobilinogen, UA 0.2     Nitrite, UA neg     Leukocytes, UA Trace       EKG/XRAY:   Primary read interpreted by Dr. Conley Rolls at [facility]. No obstruction or free air Chest normal   ASSESSMENT/PLAN: Encounter Diagnoses  Name Primary?  . Abdominal pain Yes  . GERD (gastroesophageal reflux disease)   . UTI (urinary tract infection)    Advise patient to monitor for worsening sxs, she still has her GB PPI-coupon for prilosec given Cipro 250 mg BID x 3 days Labs pending: CMP F/u prn    ,  PHUONG, DO 01/27/2013 5:02 PM

## 2013-01-27 NOTE — Patient Instructions (Addendum)
Gastroesophageal Reflux Disease, Adult  Gastroesophageal reflux disease (GERD) happens when acid from your stomach flows up into the esophagus. When acid comes in contact with the esophagus, the acid causes soreness (inflammation) in the esophagus. Over time, GERD may create small holes (ulcers) in the lining of the esophagus.  CAUSES   · Increased body weight. This puts pressure on the stomach, making acid rise from the stomach into the esophagus.  · Smoking. This increases acid production in the stomach.  · Drinking alcohol. This causes decreased pressure in the lower esophageal sphincter (valve or ring of muscle between the esophagus and stomach), allowing acid from the stomach into the esophagus.  · Late evening meals and a full stomach. This increases pressure and acid production in the stomach.  · A malformed lower esophageal sphincter.  Sometimes, no cause is found.  SYMPTOMS   · Burning pain in the lower part of the mid-chest behind the breastbone and in the mid-stomach area. This may occur twice a week or more often.  · Trouble swallowing.  · Sore throat.  · Dry cough.  · Asthma-like symptoms including chest tightness, shortness of breath, or wheezing.  DIAGNOSIS   Your caregiver may be able to diagnose GERD based on your symptoms. In some cases, X-rays and other tests may be done to check for complications or to check the condition of your stomach and esophagus.  TREATMENT   Your caregiver may recommend over-the-counter or prescription medicines to help decrease acid production. Ask your caregiver before starting or adding any new medicines.   HOME CARE INSTRUCTIONS   · Change the factors that you can control. Ask your caregiver for guidance concerning weight loss, quitting smoking, and alcohol consumption.  · Avoid foods and drinks that make your symptoms worse, such as:  · Caffeine or alcoholic drinks.  · Chocolate.  · Peppermint or mint flavorings.  · Garlic and onions.  · Spicy foods.  · Citrus fruits,  such as oranges, lemons, or limes.  · Tomato-based foods such as sauce, chili, salsa, and pizza.  · Fried and fatty foods.  · Avoid lying down for the 3 hours prior to your bedtime or prior to taking a nap.  · Eat small, frequent meals instead of large meals.  · Wear loose-fitting clothing. Do not wear anything tight around your waist that causes pressure on your stomach.  · Raise the head of your bed 6 to 8 inches with wood blocks to help you sleep. Extra pillows will not help.  · Only take over-the-counter or prescription medicines for pain, discomfort, or fever as directed by your caregiver.  · Do not take aspirin, ibuprofen, or other nonsteroidal anti-inflammatory drugs (NSAIDs).  SEEK IMMEDIATE MEDICAL CARE IF:   · You have pain in your arms, neck, jaw, teeth, or back.  · Your pain increases or changes in intensity or duration.  · You develop nausea, vomiting, or sweating (diaphoresis).  · You develop shortness of breath, or you faint.  · Your vomit is green, yellow, black, or looks like coffee grounds or blood.  · Your stool is red, bloody, or black.  These symptoms could be signs of other problems, such as heart disease, gastric bleeding, or esophageal bleeding.  MAKE SURE YOU:   · Understand these instructions.  · Will watch your condition.  · Will get help right away if you are not doing well or get worse.  Document Released: 05/12/2005 Document Revised: 10/25/2011 Document Reviewed: 02/19/2011  Exit[employer]® Patient   Information ©2014 [employer].

## 2013-01-28 LAB — URINE CULTURE: Colony Count: 25000

## 2013-01-30 ENCOUNTER — Telehealth: Payer: Self-pay | Admitting: Family Medicine

## 2013-01-30 ENCOUNTER — Other Ambulatory Visit: Payer: Self-pay | Admitting: Emergency Medicine

## 2013-01-30 DIAGNOSIS — N390 Urinary tract infection, site not specified: Secondary | ICD-10-CM

## 2013-01-30 MED ORDER — AMOXICILLIN 500 MG PO CAPS: 500.0000 mg | ORAL_CAPSULE | Freq: Two times a day (BID) | ORAL | Status: DC

## 2013-01-30 NOTE — Telephone Encounter (Signed)
She is still not feeling better, not normally treat with only 25,0000 colonies but will treat since not improving. so I will put her on PCN for 7 days and see how she does. D/w her lab abd urine cx results. Gross sideeffects, risk and benefits, and alternatives of medications d/w patient. Patient is aware that all medications have potential sideeffects and we are unable to predict every sideeffect or drug-drug interaction that may occur.

## 2013-04-03 ENCOUNTER — Other Ambulatory Visit: Payer: Self-pay | Admitting: Physician Assistant

## 2013-04-06 ENCOUNTER — Encounter: Payer: Self-pay | Admitting: Internal Medicine

## 2013-04-06 ENCOUNTER — Ambulatory Visit (INDEPENDENT_AMBULATORY_CARE_PROVIDER_SITE_OTHER): Payer: Medicare Other | Admitting: Internal Medicine

## 2013-04-06 VITALS — BP 138/78 | HR 60 | Temp 98.6°F | Resp 16 | Ht 62.0 in | Wt 232.0 lb

## 2013-04-06 DIAGNOSIS — R7989 Other specified abnormal findings of blood chemistry: Secondary | ICD-10-CM

## 2013-04-06 DIAGNOSIS — Z79899 Other long term (current) drug therapy: Secondary | ICD-10-CM

## 2013-04-06 DIAGNOSIS — J309 Allergic rhinitis, unspecified: Secondary | ICD-10-CM

## 2013-04-06 DIAGNOSIS — E039 Hypothyroidism, unspecified: Secondary | ICD-10-CM

## 2013-04-06 DIAGNOSIS — E785 Hyperlipidemia, unspecified: Secondary | ICD-10-CM

## 2013-04-06 DIAGNOSIS — I10 Essential (primary) hypertension: Secondary | ICD-10-CM

## 2013-04-06 DIAGNOSIS — N390 Urinary tract infection, site not specified: Secondary | ICD-10-CM

## 2013-04-06 LAB — POCT URINALYSIS DIPSTICK
Bilirubin, UA: NEGATIVE
Blood, UA: NEGATIVE
Glucose, UA: NEGATIVE
Ketones, UA: NEGATIVE
Leukocytes, UA: NEGATIVE
Nitrite, UA: NEGATIVE
Protein, UA: NEGATIVE
Spec Grav, UA: 1.025
Urobilinogen, UA: 0.2
pH, UA: 6

## 2013-04-06 LAB — POCT UA - MICROSCOPIC ONLY
Casts, Ur, LPF, POC: NEGATIVE
Crystals, Ur, HPF, POC: NEGATIVE
RBC, urine, microscopic: NEGATIVE
Yeast, UA: NEGATIVE

## 2013-04-06 MED ORDER — FLUTICASONE PROPIONATE 50 MCG/ACT NA SUSP: 2.0000 | Freq: Every day | NASAL | Status: DC

## 2013-04-06 MED ORDER — LOSARTAN POTASSIUM 100 MG PO TABS: 100.0000 mg | ORAL_TABLET | Freq: Every day | ORAL | Status: DC

## 2013-04-06 MED ORDER — LEVOTHYROXINE SODIUM 100 MCG PO TABS: 100.0000 ug | ORAL_TABLET | Freq: Every day | ORAL | Status: DC

## 2013-04-06 MED ORDER — ROSUVASTATIN CALCIUM 20 MG PO TABS: 20.0000 mg | ORAL_TABLET | Freq: Every day | ORAL | Status: DC

## 2013-04-06 NOTE — Progress Notes (Signed)
  Subjective:    Patient ID: Alyssa Jefferson, female    DOB: July 16, 1946, 67 y.o.   MRN: [id number]  HPI  67 y.o. Female presents to clinic today for refills on medication: losartan, fluoxetine and levothyroxine. Noticed that fluoxetine is not working as well as it had been .  Has thick mucus in throat and is having trouble clearing throat. Has had this problem for several months. Has allergys and only uses fluticasone prn.  Review of Systems     Objective:   Physical Exam  Vitals reviewed. Constitutional: She is oriented to person, place, and time. She appears well-developed and well-nourished.  Eyes: EOM are normal. Pupils are equal, round, and reactive to light.  Neck: Normal range of motion.  Cardiovascular: Normal rate, regular rhythm and normal heart sounds.   Pulmonary/Chest: Effort normal and breath sounds normal.  Musculoskeletal: Normal range of motion.  Neurological: She is alert and oriented to person, place, and time. No cranial nerve deficit. She exhibits normal muscle tone. Coordination normal.  Psychiatric: She has a normal mood and affect. Her behavior is normal. Judgment and thought content normal.   Is obese       Assessment & Plan:  Increase fluoxitine to 40mg /day RF all meds Use fluticasone daily

## 2013-04-06 NOTE — Patient Instructions (Signed)
DASH Diet The DASH diet stands for "Dietary Approaches to Stop Hypertension." It is a healthy eating plan that has been shown to reduce high blood pressure (hypertension) in as little as 14 days, while also possibly providing other significant health benefits. These other health benefits include reducing the risk of breast cancer after menopause and reducing the risk of type 2 diabetes, heart disease, colon cancer, and stroke. Health benefits also include weight loss and slowing kidney failure in patients with chronic kidney disease.  DIET GUIDELINES  Limit salt (sodium). Your diet should contain less than 1500 mg of sodium daily.  Limit refined or processed carbohydrates. Your diet should include mostly whole grains. Desserts and added sugars should be used sparingly.  Include small amounts of heart-healthy fats. These types of fats include nuts, oils, and tub margarine. Limit saturated and trans fats. These fats have been shown to be harmful in the body. CHOOSING FOODS  The following food groups are based on a 2000 calorie diet. See your Registered Dietitian for individual calorie needs. Grains and Grain Products (6 to 8 servings daily)  Eat More Often: Whole-wheat bread, brown rice, whole-grain or wheat pasta, quinoa, popcorn without added fat or salt (air popped).  Eat Less Often: White bread, white pasta, white rice, cornbread. Vegetables (4 to 5 servings daily)  Eat More Often: Fresh, frozen, and canned vegetables. Vegetables may be raw, steamed, roasted, or grilled with a minimal amount of fat.  Eat Less Often/Avoid: Creamed or fried vegetables. Vegetables in a cheese sauce. Fruit (4 to 5 servings daily)  Eat More Often: All fresh, canned (in natural juice), or frozen fruits. Dried fruits without added sugar. One hundred percent fruit juice ( cup [237 mL] daily).  Eat Less Often: Dried fruits with added sugar. Canned fruit in light or heavy syrup. Lean Meats, Fish, and Poultry (2  servings or less daily. One serving is 3 to 4 oz [85-114 g]).  Eat More Often: Ninety percent or leaner ground beef, tenderloin, sirloin. Round cuts of beef, chicken breast, turkey breast. All fish. Grill, bake, or broil your meat. Nothing should be fried.  Eat Less Often/Avoid: Fatty cuts of meat, turkey, or chicken leg, thigh, or wing. Fried cuts of meat or fish. Dairy (2 to 3 servings)  Eat More Often: Low-fat or fat-free milk, low-fat plain or light yogurt, reduced-fat or part-skim cheese.  Eat Less Often/Avoid: Milk (whole, 2%).Whole milk yogurt. Full-fat cheeses. Nuts, Seeds, and Legumes (4 to 5 servings per week)  Eat More Often: All without added salt.  Eat Less Often/Avoid: Salted nuts and seeds, canned beans with added salt. Fats and Sweets (limited)  Eat More Often: Vegetable oils, tub margarines without trans fats, sugar-free gelatin. Mayonnaise and salad dressings.  Eat Less Often/Avoid: Coconut oils, palm oils, butter, stick margarine, cream, half and half, cookies, candy, pie. FOR MORE INFORMATION The Dash Diet Eating Plan: www.dashdiet.org Document Released: 07/22/2011 Document Revised: 10/25/2011 Document Reviewed: 07/22/2011 ExitCare Patient Information 2014 [employer]. Calorie Counting Diet A calorie counting diet requires you to eat the number of calories that are right for you in a day. Calories are the measurement of how much energy you get from the food you eat. Eating the right amount of calories is important for staying at a healthy weight. If you eat too many calories, your body will store them as fat and you may gain weight. If you eat too few calories, you may lose weight. Counting the number of calories you eat during   a day will help you know if you are eating the right amount. A Registered Dietitian can determine how many calories you need in a day. The amount of calories needed varies from person to person. If your goal is to lose weight, you will need  to eat fewer calories. Losing weight can benefit you if you are overweight or have health problems such as heart disease, high blood pressure, or diabetes. If your goal is to gain weight, you will need to eat more calories. Gaining weight may be necessary if you have a certain health problem that causes your body to need more energy. TIPS Whether you are increasing or decreasing the number of calories you eat during a day, it may be hard to get used to changes in what you eat and drink. The following are tips to help you keep track of the number of calories you eat.  Measure foods at home with measuring cups. This helps you know the amount of food and number of calories you are eating.  Restaurants often serve food in amounts that are larger than 1 serving. While eating out, estimate how many servings of a food you are given. For example, a serving of cooked rice is  cup or about the size of half of a fist. Knowing serving sizes will help you be aware of how much food you are eating at restaurants.  Ask for smaller portion sizes or child-size portions at restaurants.  Plan to eat half of a meal at a restaurant. Take the rest home or share the other half with a friend.  Read the Nutrition Facts panel on food labels for calorie content and serving size. You can find out how many servings are in a package, the size of a serving, and the number of calories each serving has.  For example, a package might contain 3 cookies. The Nutrition Facts panel on that package says that 1 serving is 1 cookie. Below that, it will say there are 3 servings in the container. The calories section of the Nutrition Facts label says there are 90 calories. This means there are 90 calories in 1 cookie (1 serving). If you eat 1 cookie you have eaten 90 calories. If you eat all 3 cookies, you have eaten 270 calories (3 servings x 90 calories = 270 calories). The list below tells you how big or small some common portion sizes  are.  1 oz.........4 stacked dice.  3 oz.........Deck of cards.  1 tsp........Tip of little finger.  1 tbs........Thumb.  2 tbs........Golf ball.   cup.......Half of a fist.  1 cup........A fist. KEEP A FOOD LOG Write down every food item you eat, the amount you eat, and the number of calories in each food you eat during the day. At the end of the day, you can add up the total number of calories you have eaten. It may help to keep a list like the one below. Find out the calorie information by reading the Nutrition Facts panel on food labels. Breakfast  Bran cereal (1 cup, 110 calories).  Fat-free milk ( cup, 45 calories). Snack  Apple (1 medium, 80 calories). Lunch  Spinach (1 cup, 20 calories).  Tomato ( medium, 20 calories).  Chicken breast strips (3 oz, 165 calories).  Shredded cheddar cheese ( cup, 110 calories).  Light Italian dressing (2 tbs, 60 calories).  Whole-wheat bread (1 slice, 80 calories).  Tub margarine (1 tsp, 35 calories).  Vegetable soup (1 cup, 160 calories). Dinner    Pork chop (3 oz, 190 calories).  Brown rice (1 cup, 215 calories).  Steamed broccoli ( cup, 20 calories).  Strawberries (1  cup, 65 calories).  Whipped cream (1 tbs, 50 calories). Daily Calorie Total: 1425 Document Released: 08/02/2005 Document Revised: 10/25/2011 Document Reviewed: 01/27/2007 ExitCare Patient Information 2014 [employer].  

## 2013-04-07 LAB — LIPID PANEL
Cholesterol: 239 mg/dL — ABNORMAL HIGH (ref 0–200)
HDL: 71 mg/dL (ref >39)
LDL Cholesterol: 150 mg/dL — ABNORMAL HIGH (ref 0–99)
Total CHOL/HDL Ratio: 3.4 Ratio
Triglycerides: 92 mg/dL (ref <150)
VLDL: 18 mg/dL (ref 0–40)

## 2013-04-07 LAB — COMPREHENSIVE METABOLIC PANEL
ALT: 11 U/L (ref 0–35)
AST: 15 U/L (ref 0–37)
Albumin: 4.2 g/dL (ref 3.5–5.2)
Alkaline Phosphatase: 92 U/L (ref 39–117)
BUN: 15 mg/dL (ref 6–23)
CO2: 29 mEq/L (ref 19–32)
Calcium: 9.4 mg/dL (ref 8.4–10.5)
Chloride: 103 mEq/L (ref 96–112)
Creat: 1.09 mg/dL (ref 0.50–1.10)
Glucose, Bld: 76 mg/dL (ref 70–99)
Potassium: 4 mEq/L (ref 3.5–5.3)
Sodium: 141 mEq/L (ref 135–145)
Total Bilirubin: 0.8 mg/dL (ref 0.3–1.2)
Total Protein: 7.7 g/dL (ref 6.0–8.3)

## 2013-04-07 LAB — TSH: TSH: 1.875 u[IU]/mL (ref 0.350–4.500)

## 2013-06-08 ENCOUNTER — Ambulatory Visit: Payer: Medicare Other

## 2013-06-08 ENCOUNTER — Ambulatory Visit (INDEPENDENT_AMBULATORY_CARE_PROVIDER_SITE_OTHER): Payer: Medicare Other | Admitting: Emergency Medicine

## 2013-06-08 VITALS — BP 144/70 | HR 62 | Temp 97.4°F | Resp 16 | Ht 62.0 in | Wt 235.2 lb

## 2013-06-08 DIAGNOSIS — R079 Chest pain, unspecified: Secondary | ICD-10-CM

## 2013-06-08 MED ORDER — CYCLOBENZAPRINE HCL 10 MG PO TABS: 10.0000 mg | ORAL_TABLET | Freq: Two times a day (BID) | ORAL | Status: DC | PRN

## 2013-06-08 NOTE — Progress Notes (Signed)
[address]   [place], [place]  [address]   [phone]  Subjective:    Patient ID: Alyssa Jefferson, female    DOB: 07/12/1946, 67 y.o.   MRN: [id number]  HPI  Alyssa Jefferson is a 67 y.o. female who presents to office complaining of constant right sided back pain that began yesterday. The pain radiates to her right chest wall. She states that she has been helping her sister in and out of bed the past few days and may have strained it doing that. The pain is worsened with twisting movement and deep breathing. She took two Tylenol for the pain yesterday without relief.  She denies breaking out in a rash with the back pain. She denies dysuria, hematuria, or frequency.   Past Medical History  Diagnosis Date  . High cholesterol   . Hypertension   . Depression   . Cancer 2004    breast  . OSA (obstructive sleep apnea)   . Glaucoma   . Thyroid disease     thyroid removed   Past Surgical History  Procedure Laterality Date  . Breast surgery  2004    left breast removed due to cancer no chemo or radiation  . Abdominal hysterectomy  83    BSO  . Thyroid removed      due to knot  . Knee arthroscopy Left    Family History  Problem Relation Age of Onset  . Lung cancer Father   . Breast cancer Sister   . Ovarian cancer Sister    History   Social History  . Marital Status: Married    Spouse Name: N/A    Number of Children: N/A  . Years of Education: N/A   Occupational History  . retired    Social History Main Topics  . Smoking status: Former Smoker -- 20 years    Types: Cigarettes    Quit date: 08/16/1978  . Smokeless tobacco: Never Used     Comment: 1 pack per week--10/16/12  . Alcohol Use: No  . Drug Use: No  . Sexual Activity: No   Other Topics Concern  . Not on file   Social History Narrative  . No narrative on file   Allergies  Allergen Reactions  . Codeine   . Lipitor [Atorvastatin Calcium]    Current Outpatient Prescriptions on File Prior to Visit    Medication Sig Dispense Refill  . FLUoxetine (PROZAC) 20 MG tablet Take 30 mg by mouth daily.      . fluticasone (FLONASE) 50 MCG/ACT nasal spray Place 2 sprays into the nose daily.  16 g  6  . latanoprost (XALATAN) 0.005 % ophthalmic solution Place 1 drop into both eyes at bedtime.      Marland Kitchen levothyroxine (SYNTHROID) 100 MCG tablet Take 1 tablet (100 mcg total) by mouth daily before breakfast.  30 tablet  5  . losartan (COZAAR) 100 MG tablet Take 1 tablet (100 mg total) by mouth daily. PATIENT NEEDS OFFICE VISIT FOR ADDITIONAL REFILLS  90 tablet  3  . rosuvastatin (CRESTOR) 20 MG tablet Take 1 tablet (20 mg total) by mouth daily.  90 tablet  3   No current facility-administered medications on file prior to visit.     Review of Systems  Genitourinary: Positive for dysuria, frequency and hematuria.  Musculoskeletal: Positive for back pain.       Objective:   Physical Exam  Cardiovascular: Normal rate.   No murmur heard. Pulmonary/Chest:  Breath sounds  symmetrical.  Tenderness along costal margin on the right.  Abdominal: Soft. There is no tenderness.   [facility] reading (PRIMARY) by  Dr. Cleta Alberts chest x-ray is normal         Assessment & Plan:  I suspect patient has a pulled muscle. We'll treat it as such. She was given a prescription for Flexeril. She will also use some Tylenol and see if this helps .   I personally performed the services described in this documentation, which was scribed in my presence. The recorded information has been reviewed and is accurate.

## 2013-06-10 ENCOUNTER — Ambulatory Visit (INDEPENDENT_AMBULATORY_CARE_PROVIDER_SITE_OTHER): Payer: Medicare Other | Admitting: Family Medicine

## 2013-06-10 VITALS — BP 138/74 | HR 80 | Temp 98.2°F | Resp 16 | Ht 62.0 in | Wt 235.0 lb

## 2013-06-10 DIAGNOSIS — B029 Zoster without complications: Secondary | ICD-10-CM

## 2013-06-10 MED ORDER — GABAPENTIN 100 MG PO CAPS: 200.0000 mg | ORAL_CAPSULE | Freq: Every day | ORAL | Status: DC

## 2013-06-10 MED ORDER — HYDROCODONE-ACETAMINOPHEN 5-325 MG PO TABS: 0.5000 | ORAL_TABLET | Freq: Four times a day (QID) | ORAL | Status: DC | PRN

## 2013-06-10 MED ORDER — VALACYCLOVIR HCL 1 G PO TABS: 1000.0000 mg | ORAL_TABLET | Freq: Three times a day (TID) | ORAL | Status: DC

## 2013-06-10 NOTE — Progress Notes (Signed)
Subjective:    Patient ID: Alyssa Jefferson, female    DOB: 1946-07-28, 67 y.o.   MRN: [id number]  No chief complaint on file.  HPI Alyssa Jefferson is a 67 y.o. female who presents to [facility] complaining of a  rash below and lateral to her right breast onset 3 days ago. She states the rash is there in the morning but fades away as the day progresses. She is unsure if it present currently. She reports an associated severe pain, 10/10 currently.  Patient was seen here 2 days ago for right-sided back pain that radiated into the right chest wall. Patient was treated for a pulled muscle and started on a course of Tylenol and Flexeril. She has not been getting any relief from these and her pain has worsened.  Patient reports that she has taken hydrocodone in the past after a surgery that was effective at relieving pain symptoms without complication though she reports an allergy to codeine.    Past Medical History  Diagnosis Date  . High cholesterol   . Hypertension   . Depression   . Cancer 2004    breast  . OSA (obstructive sleep apnea)   . Glaucoma   . Thyroid disease     thyroid removed   Current Outpatient Prescriptions on File Prior to Visit  Medication Sig Dispense Refill  . cyclobenzaprine (FLEXERIL) 10 MG tablet Take 1 tablet (10 mg total) by mouth 2 (two) times daily as needed for muscle spasms.  30 tablet  1  . FLUoxetine (PROZAC) 20 MG tablet Take 30 mg by mouth daily.      . fluticasone (FLONASE) 50 MCG/ACT nasal spray Place 2 sprays into the nose daily.  16 g  6  . latanoprost (XALATAN) 0.005 % ophthalmic solution Place 1 drop into both eyes at bedtime.      Marland Kitchen levothyroxine (SYNTHROID) 100 MCG tablet Take 1 tablet (100 mcg total) by mouth daily before breakfast.  30 tablet  5  . losartan (COZAAR) 100 MG tablet Take 1 tablet (100 mg total) by mouth daily. PATIENT NEEDS OFFICE VISIT FOR ADDITIONAL REFILLS  90 tablet  3  . rosuvastatin (CRESTOR) 20 MG tablet  Take 1 tablet (20 mg total) by mouth daily.  90 tablet  3   No current facility-administered medications on file prior to visit.   Allergies  Allergen Reactions  . Codeine   . Lipitor [Atorvastatin Calcium]    Review of Systems  Constitutional: Negative for fever, chills and diaphoresis.  Musculoskeletal: Positive for back pain and myalgias. Negative for arthralgias and joint swelling.  Skin: Positive for color change and rash. Negative for pallor and wound.  Hematological: Negative for adenopathy. Does not bruise/bleed easily.  Psychiatric/Behavioral: Positive for sleep disturbance.     BP 138/74  Pulse 80  Temp(Src) 98.2 F (36.8 C)  Resp 16  Ht 5\' 2"  (1.575 m)  Wt 235 lb (106.595 kg)  BMI 42.97 kg/m2  SpO2 100%  Objective:   Physical Exam  Nursing note and vitals reviewed. Constitutional: She is oriented to person, place, and time. She appears well-developed and well-nourished. No distress.  HENT:  Head: Normocephalic and atraumatic.  Eyes: Conjunctivae are normal.  Neck: Normal range of motion. Neck supple.  Pulmonary/Chest: Effort normal. No respiratory distress.  Abdominal: She exhibits no distension.  Musculoskeletal: Normal range of motion.  Neurological: She is alert and oriented to person, place, and time.  Skin: Skin is warm and  dry. Rash noted. There is erythema.  Erythematous, papular rash with clustered papules in an  approximately 1 inch by 3 inch oval. Few papules around the right thoracic paraspinal, around T7 and T8.   Psychiatric: She has a normal mood and affect. Her behavior is normal.      Assessment & Plan:  12:59 PM- Discussed that symptoms are likely due to shingles and will start patient on a course of Valtrex, with prn hydrocodone, and qhs Gabapentin to manage symptoms. Advised patient to discontinue using the Tylenol and Flexeril. Advised patient to apply ice to the affected areas at home and to minimize scratching as to not disrupt the  vesicles. Discussed treatment plan with patient at bedside and patient verbalized agreement.  Shingles  Meds ordered this encounter  Medications  . valACYclovir (VALTREX) 1000 MG tablet    Sig: Take 1 tablet (1,000 mg total) by mouth 3 (three) times daily.    Dispense:  21 tablet    Refill:  0  . HYDROcodone-acetaminophen (NORCO/VICODIN) 5-325 MG per tablet    Sig: Take 0.5-1 tablets by mouth every 6 (six) hours as needed for pain.    Dispense:  20 tablet    Refill:  0  . gabapentin (NEURONTIN) 100 MG capsule    Sig: Take 2 capsules (200 mg total) by mouth at bedtime.    Dispense:  40 capsule    Refill:  0    I personally performed the services described in this documentation, which was scribed in my presence. The recorded information has been reviewed and considered, and addended by me as needed.  Norberto Sorenson, MD MPH

## 2013-06-10 NOTE — Patient Instructions (Signed)
Shingles Shingles (herpes zoster) is an infection that is caused by the same virus that causes chickenpox (varicella). The infection causes a painful skin rash and fluid-filled blisters, which eventually break open, crust over, and heal. It may occur in any area of the body, but it usually affects only one side of the body or face. The pain of shingles usually lasts about 1 month. However, some people with shingles may develop long-term (chronic) pain in the affected area of the body. Shingles often occurs many years after the person had chickenpox. It is more common:  In people older than 50 years.  In people with weakened immune systems, such as those with HIV, AIDS, or cancer.  In people taking medicines that weaken the immune system, such as transplant medicines.  In people under great stress. CAUSES  Shingles is caused by the varicella zoster virus (VZV), which also causes chickenpox. After a person is infected with the virus, it can remain in the person's body for years in an inactive state (dormant). To cause shingles, the virus reactivates and breaks out as an infection in a nerve root. The virus can be spread from person to person (contagious) through contact with open blisters of the shingles rash. It will only spread to people who have not had chickenpox. When these people are exposed to the virus, they may develop chickenpox. They will not develop shingles. Once the blisters scab over, the person is no longer contagious and cannot spread the virus to others. SYMPTOMS  Shingles shows up in stages. The initial symptoms may be pain, itching, and tingling in an area of the skin. This pain is usually described as burning, stabbing, or throbbing.In a few days or weeks, a painful red rash will appear in the area where the pain, itching, and tingling were felt. The rash is usually on one side of the body in a band or belt-like pattern. Then, the rash usually turns into fluid-filled blisters. They  will scab over and dry up in approximately 2 3 weeks. Flu-like symptoms may also occur with the initial symptoms, the rash, or the blisters. These may include:  Fever.  Chills.  Headache.  Upset stomach. DIAGNOSIS  Your caregiver will perform a skin exam to diagnose shingles. Skin scrapings or fluid samples may also be taken from the blisters. This sample will be examined under a microscope or sent to a lab for further testing. TREATMENT  There is no specific cure for shingles. Your caregiver will likely prescribe medicines to help you manage the pain, recover faster, and avoid long-term problems. This may include antiviral drugs, anti-inflammatory drugs, and pain medicines. HOME CARE INSTRUCTIONS   Take a cool bath or apply cool compresses to the area of the rash or blisters as directed. This may help with the pain and itching.   Only take over-the-counter or prescription medicines as directed by your caregiver.   Rest as directed by your caregiver.  Keep your rash and blisters clean with mild soap and cool water or as directed by your caregiver.  Do not pick your blisters or scratch your rash. Apply an anti-itch cream or numbing creams to the affected area as directed by your caregiver.  Keep your shingles rash covered with a loose bandage (dressing).  Avoid skin contact with:  Babies.   Pregnant women.   Children with eczema.   Elderly people with transplants.   People with chronic illnesses, such as leukemia or AIDS.   Wear loose-fitting clothing to help ease   the pain of material rubbing against the rash.  Keep all follow-up appointments with your caregiver.If the area involved is on your face, you may receive a referral for follow-up to a specialist, such as an eye doctor (ophthalmologist) or an ear, nose, and throat (ENT) doctor. Keeping all follow-up appointments will help you avoid eye complications, chronic pain, or disability.  SEEK IMMEDIATE MEDICAL  CARE IF:   You have facial pain, pain around the eye area, or loss of feeling on one side of your face.  You have ear pain or ringing in your ear.  You have loss of taste.  Your pain is not relieved with prescribed medicines.   Your redness or swelling spreads.   You have more pain and swelling.  Your condition is worsening or has changed.   You have a feveror persistent symptoms for more than 2 3 days.  You have a fever and your symptoms suddenly get worse. MAKE SURE YOU:  Understand these instructions.  Will watch your condition.  Will get help right away if you are not doing well or get worse. Document Released: 08/02/2005 Document Revised: 04/26/2012 Document Reviewed: 03/16/2012 [employer] Patient Information 2014 [employer].  

## 2013-06-20 ENCOUNTER — Other Ambulatory Visit: Payer: Self-pay

## 2013-06-20 NOTE — Telephone Encounter (Signed)
Pended please advise.  

## 2013-06-20 NOTE — Telephone Encounter (Signed)
Pt states that she has shingles and that she has ran out of her hydrocodone and she would like a refill on it bc she is still in pain Call back number [phone]

## 2013-06-21 MED ORDER — HYDROCODONE-ACETAMINOPHEN 5-325 MG PO TABS: 0.5000 | ORAL_TABLET | Freq: Four times a day (QID) | ORAL | Status: DC | PRN

## 2013-06-21 NOTE — Telephone Encounter (Signed)
LMOM that Rx is ready for p/up 

## 2013-06-21 NOTE — Telephone Encounter (Signed)
Yes, ok to refill x 1

## 2013-08-28 ENCOUNTER — Telehealth: Payer: Self-pay

## 2013-08-28 MED ORDER — PRAVASTATIN SODIUM 40 MG PO TABS: 40.0000 mg | ORAL_TABLET | Freq: Every day | ORAL | Status: DC

## 2013-08-28 NOTE — Telephone Encounter (Signed)
Okay for her to have pravastatin 40 mg 1 a day #30 tablets with refills for one year

## 2013-08-28 NOTE — Telephone Encounter (Signed)
Patient is needing medicines refilled but is needing to speak with Dr Everlene Farrier regarding the price and getting generics called in for her.    Best#: [phone]

## 2013-08-28 NOTE — Telephone Encounter (Signed)
Advised pt medication is at her pharmacy.

## 2013-08-28 NOTE — Telephone Encounter (Signed)
Pt would like Crestor changed to [facility]- She has been out of this for a few days and can not afford the co pay.  Pravastatin used in the past and would like a prescription sent in for this instead.     [place]

## 2013-10-20 ENCOUNTER — Other Ambulatory Visit: Payer: Self-pay | Admitting: Emergency Medicine

## 2013-10-23 ENCOUNTER — Ambulatory Visit (INDEPENDENT_AMBULATORY_CARE_PROVIDER_SITE_OTHER): Payer: Medicare Other | Admitting: Emergency Medicine

## 2013-10-23 VITALS — BP 130/76 | HR 56 | Temp 98.4°F | Resp 16 | Ht 61.0 in | Wt 233.6 lb

## 2013-10-23 DIAGNOSIS — E039 Hypothyroidism, unspecified: Secondary | ICD-10-CM

## 2013-10-23 DIAGNOSIS — N390 Urinary tract infection, site not specified: Secondary | ICD-10-CM

## 2013-10-23 DIAGNOSIS — J209 Acute bronchitis, unspecified: Secondary | ICD-10-CM

## 2013-10-23 DIAGNOSIS — J309 Allergic rhinitis, unspecified: Secondary | ICD-10-CM

## 2013-10-23 DIAGNOSIS — Z79899 Other long term (current) drug therapy: Secondary | ICD-10-CM

## 2013-10-23 DIAGNOSIS — E785 Hyperlipidemia, unspecified: Secondary | ICD-10-CM

## 2013-10-23 DIAGNOSIS — R945 Abnormal results of liver function studies: Secondary | ICD-10-CM

## 2013-10-23 DIAGNOSIS — R7989 Other specified abnormal findings of blood chemistry: Secondary | ICD-10-CM

## 2013-10-23 DIAGNOSIS — R51 Headache: Secondary | ICD-10-CM

## 2013-10-23 DIAGNOSIS — F411 Generalized anxiety disorder: Secondary | ICD-10-CM

## 2013-10-23 DIAGNOSIS — I10 Essential (primary) hypertension: Secondary | ICD-10-CM

## 2013-10-23 LAB — POCT CBC
Granulocyte percent: 53.7 %G (ref 37–80)
HCT, POC: 37.2 % — AB (ref 37.7–47.9)
Hemoglobin: 11.2 g/dL — AB (ref 12.2–16.2)
Lymph, poc: 1.7 (ref 0.6–3.4)
MCH, POC: 25.5 pg — AB (ref 27–31.2)
MCHC: 30.1 g/dL — AB (ref 31.8–35.4)
MCV: 84.7 fL (ref 80–97)
MID (cbc): 0.5 (ref 0–0.9)
MPV: 6.8 fL (ref 0–99.8)
POC Granulocyte: 2.6 (ref 2–6.9)
POC LYMPH PERCENT: 35.5 %L (ref 10–50)
POC MID %: 10.8 %M (ref 0–12)
Platelet Count, POC: 350 10*3/uL (ref 142–424)
RBC: 4.39 M/uL (ref 4.04–5.48)
RDW, POC: 15.1 %
WBC: 4.8 10*3/uL (ref 4.6–10.2)

## 2013-10-23 LAB — COMPREHENSIVE METABOLIC PANEL
ALT: 13 U/L (ref 0–35)
AST: 15 U/L (ref 0–37)
Albumin: 3.9 g/dL (ref 3.5–5.2)
Alkaline Phosphatase: 79 U/L (ref 39–117)
BUN: 16 mg/dL (ref 6–23)
CO2: 27 mEq/L (ref 19–32)
Calcium: 8.8 mg/dL (ref 8.4–10.5)
Chloride: 106 mEq/L (ref 96–112)
Creat: 1.22 mg/dL — ABNORMAL HIGH (ref 0.50–1.10)
Glucose, Bld: 73 mg/dL (ref 70–99)
Potassium: 4.3 mEq/L (ref 3.5–5.3)
Sodium: 141 mEq/L (ref 135–145)
Total Bilirubin: 0.8 mg/dL (ref 0.2–1.2)
Total Protein: 6.9 g/dL (ref 6.0–8.3)

## 2013-10-23 LAB — LIPID PANEL
Cholesterol: 244 mg/dL — ABNORMAL HIGH (ref 0–200)
HDL: 59 mg/dL (ref >39)
LDL Cholesterol: 164 mg/dL — ABNORMAL HIGH (ref 0–99)
Total CHOL/HDL Ratio: 4.1 Ratio
Triglycerides: 104 mg/dL (ref <150)
VLDL: 21 mg/dL (ref 0–40)

## 2013-10-23 LAB — TSH: TSH: 1.643 u[IU]/mL (ref 0.350–4.500)

## 2013-10-23 LAB — POCT SEDIMENTATION RATE: POCT SED RATE: 60 mm/hr — AB (ref 0–22)

## 2013-10-23 MED ORDER — AMOXICILLIN-POT CLAVULANATE 875-125 MG PO TABS: 1.0000 | ORAL_TABLET | Freq: Two times a day (BID) | ORAL | Status: DC

## 2013-10-23 MED ORDER — LOSARTAN POTASSIUM 100 MG PO TABS: 100.0000 mg | ORAL_TABLET | Freq: Every day | ORAL | Status: DC

## 2013-10-23 MED ORDER — GUAIFENESIN-DM 100-10 MG/5ML PO SYRP: 5.0000 mL | ORAL_SOLUTION | ORAL | Status: DC | PRN

## 2013-10-23 MED ORDER — FLUTICASONE PROPIONATE 50 MCG/ACT NA SUSP: 2.0000 | Freq: Every day | NASAL | Status: DC

## 2013-10-23 MED ORDER — FLUOXETINE HCL 20 MG PO TABS: 40.0000 mg | ORAL_TABLET | Freq: Every day | ORAL | Status: DC

## 2013-10-23 NOTE — Patient Instructions (Signed)

## 2013-10-23 NOTE — Progress Notes (Signed)
[facility] [address], [place] [address] [phone]  Date:  10/23/2013   Jefferson:  Alyssa Jefferson   DOB:  February 24, 1946   MRN:  [id number]  PCP:  Jenny Reichmann, MD    Chief Complaint: Headache and Medication Refill   History of Present Illness:  Alyssa Jefferson is a 68 y.o. very pleasant female patient who presents with the following:  Ill with cough productive mucopurulent sputum for past week.  Now has nasal congestion and a clear drainage.  No frontal headache, fever or chills.  Has some wheezing and no shortness of breath.  No improvement with over the counter medications or other home remedies.  Has need for recheck on lipids and is fasting.  Has left sided headache that interferes with sleeping as it hurts when she lays on her left side. Headache present for past year.  Less frequent during the daylight hours.  Experiences headache 1 day weekly.  No associated neuro or visual symptoms.  No improvement with over the counter medications or other home remedies. .    Patient Active Problem List   Diagnosis Date Noted  . OSA (obstructive sleep apnea) 10/16/2012  . BMI 40.0-44.9, adult 12/20/2011  . Hypothyroid 12/20/2011  . 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 Osteoarthritis, knee 12/20/2011  . Other and unspecified hyperlipidemia 12/20/2011  . Chronic depression 12/20/2011  . Uncontrolled hypertension as indication for native nephrectomy 12/20/2011  . Anemia 12/20/2011  . High cholesterol   . Cancer     Past Medical History  Diagnosis Date  . High cholesterol   . Hypertension   . Depression   . Cancer 2004    breast  . OSA (obstructive sleep apnea)   . Glaucoma   . Thyroid disease     thyroid removed    Past Surgical History  Procedure Laterality Date  . Breast surgery  2004    left breast removed due to cancer no chemo or radiation  . Abdominal hysterectomy  82    BSO  . Thyroid removed      due to  knot  . Knee arthroscopy Left     History  Substance Use Topics  . Smoking status: Former Smoker -- 20 years    Types: Cigarettes    Quit date: 08/16/1978  . Smokeless tobacco: Never Used     Comment: 1 pack per week--10/16/12  . Alcohol Use: No    Family History  Problem Relation Age of Onset  . Lung cancer Father   . Breast cancer Sister   . Ovarian cancer Sister     Allergies  Allergen Reactions  . Codeine   . Lipitor [Atorvastatin Calcium]     Medication list has been reviewed and updated.  Current Outpatient Prescriptions on File Prior to Visit  Medication Sig Dispense Refill  . FLUoxetine (PROZAC) 20 MG tablet Take 30 mg by mouth daily.      Marland Kitchen FLUoxetine (PROZAC) 20 MG tablet Take 1.5 tablets (30 mg total) by mouth daily. PATIENT NEEDS OFFICE VISIT FOR ADDITIONAL REFILLS  45 tablet  0  . fluticasone (FLONASE) 50 MCG/ACT nasal spray Place 2 sprays into the nose daily.  16 g  6  . latanoprost (XALATAN) 0.005 % ophthalmic solution Place 1 drop into both eyes at bedtime.      Marland Kitchen levothyroxine (SYNTHROID) 100 MCG tablet Take 1 tablet (100 mcg total) by mouth daily before breakfast.  30 tablet  5  . losartan (COZAAR) 100 MG tablet Take 1 tablet (100 mg total) by mouth daily. PATIENT NEEDS OFFICE VISIT FOR ADDITIONAL REFILLS  90 tablet  3  . pravastatin (PRAVACHOL) 40 MG tablet Take 1 tablet (40 mg total) by mouth daily.  90 tablet  3  . gabapentin (NEURONTIN) 100 MG capsule Take 2 capsules (200 mg total) by mouth at bedtime.  40 capsule  0  . HYDROcodone-acetaminophen (NORCO/VICODIN) 5-325 MG per tablet Take 0.5-1 tablets by mouth every 6 (six) hours as needed.  20 tablet  0  . valACYclovir (VALTREX) 1000 MG tablet Take 1 tablet (1,000 mg total) by mouth 3 (three) times daily.  21 tablet  0   No current facility-administered medications on file prior to visit.    Review of Systems:  As per HPI, otherwise negative.    Physical Examination: Filed Vitals:   10/23/13 1355   BP: 130/76  Pulse: 56  Temp: 98.4 F (36.9 C)  Resp: 16   Filed Vitals:   10/23/13 1355  Height: 5\' 1"  (1.549 m)  Weight: 233 lb 9.6 oz (105.96 kg)   Body mass index is 44.16 kg/(m^2). Ideal Body Weight: Weight in (lb) to have BMI = 25: 132  GEN: WDWN, NAD, Non-toxic, A & O x 3 HEENT: Atraumatic, Normocephalic. Neck supple. No masses, No LAD.  No tenderness of left temporal artery Ears and Nose: No external deformity.  Purulent nasal drainage. CV: RRR, No M/G/R. No JVD. No thrill. No extra heart sounds. PULM: CTA B, no wheezes, crackles, rhonchi. No retractions. No resp. distress. No accessory muscle use. ABD: S, NT, ND, +BS. No rebound. No HSM. EXTR: No c/c/e NEURO Normal gait.  PSYCH: Normally interactive. Conversant. Not depressed or anxious appearing.  Calm demeanor.    Assessment and Plan: Hypertension Hyper lipidemia Sinusitis Bronchitis Headache  Doubt temporal arteritis   Signed,  Ellison Carwin, MD

## 2013-10-24 MED ORDER — LEVOTHYROXINE SODIUM 100 MCG PO TABS: 100.0000 ug | ORAL_TABLET | Freq: Every day | ORAL | Status: DC

## 2013-10-24 MED ORDER — PRAVASTATIN SODIUM 80 MG PO TABS: 40.0000 mg | ORAL_TABLET | Freq: Every day | ORAL | Status: DC

## 2013-10-24 NOTE — Addendum Note (Signed)
Addended by: Roselee Culver on: 10/24/2013 10:33 AM   Modules accepted: Orders

## 2013-11-21 ENCOUNTER — Telehealth: Payer: Self-pay

## 2013-11-21 NOTE — Telephone Encounter (Signed)
Spoke to pt, i did not see any documentation of anyone calling her. She is aware.

## 2013-11-21 NOTE — Telephone Encounter (Signed)
Patient states that our phone number was on her caller ID. She is not sure what this call may be about being that no message was left. She believes it may be about her cholesterol. Please call patient.  [phone]

## 2013-12-25 ENCOUNTER — Ambulatory Visit (INDEPENDENT_AMBULATORY_CARE_PROVIDER_SITE_OTHER): Payer: Medicare Other | Admitting: Emergency Medicine

## 2013-12-25 ENCOUNTER — Ambulatory Visit: Payer: Medicare Other

## 2013-12-25 VITALS — BP 132/74 | HR 64 | Temp 98.1°F | Resp 16 | Ht 61.5 in | Wt 236.0 lb

## 2013-12-25 DIAGNOSIS — M549 Dorsalgia, unspecified: Secondary | ICD-10-CM

## 2013-12-25 DIAGNOSIS — S20229A Contusion of unspecified back wall of thorax, initial encounter: Secondary | ICD-10-CM

## 2013-12-25 DIAGNOSIS — S9030XA Contusion of unspecified foot, initial encounter: Secondary | ICD-10-CM

## 2013-12-25 MED ORDER — TRAMADOL HCL 50 MG PO TABS: 50.0000 mg | ORAL_TABLET | Freq: Three times a day (TID) | ORAL | Status: DC | PRN

## 2013-12-25 MED ORDER — CYCLOBENZAPRINE HCL 10 MG PO TABS: 10.0000 mg | ORAL_TABLET | Freq: Three times a day (TID) | ORAL | Status: DC | PRN

## 2013-12-25 NOTE — Progress Notes (Signed)
[facility] [address], [place] [address] [phone]  Date:  12/25/2013   Name:  Alyssa Jefferson   DOB:  1946/03/30   MRN:  [id number]  PCP:  Jenny Reichmann, MD    Chief Complaint: Fall and Back Pain   History of Present Illness:  Alyssa Jefferson is a 68 y.o. very pleasant female patient who presents with the following:  [place] down the last two steps when she tripped over clothes laying on the steps.  Has pain in low back, right ankle.  Denies any LOC or head injury or neck pain.  No radiation of pain.  Some difficulty bearing weight on right foot.  No improvement with over the counter medications or other home remedies. Denies other complaint or health concern today.   Patient Active Problem List   Diagnosis Date Noted  . OSA (obstructive sleep apnea) 10/16/2012  . BMI 40.0-44.9, adult 12/20/2011  . Hypothyroid 12/20/2011  . 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 Osteoarthritis, knee 12/20/2011  . Other and unspecified hyperlipidemia 12/20/2011  . Chronic depression 12/20/2011  . Uncontrolled hypertension as indication for native nephrectomy 12/20/2011  . Anemia 12/20/2011  . High cholesterol   . Cancer     Past Medical History  Diagnosis Date  . High cholesterol   . Hypertension   . Depression   . Cancer 2004    breast  . OSA (obstructive sleep apnea)   . Glaucoma   . Thyroid disease     thyroid removed    Past Surgical History  Procedure Laterality Date  . Breast surgery  2004    left breast removed due to cancer no chemo or radiation  . Abdominal hysterectomy  83    BSO  . Thyroid removed      due to knot  . Knee arthroscopy Left     History  Substance Use Topics  . Smoking status: Former Smoker -- 20 years    Types: Cigarettes    Quit date: 08/16/1978  . Smokeless tobacco: Never Used     Comment: 1 pack per week--10/16/12  . Alcohol Use: No    Family History  Problem Relation Age of Onset   . Lung cancer Father   . Breast cancer Sister   . Ovarian cancer Sister     Allergies  Allergen Reactions  . Codeine   . Lipitor [Atorvastatin Calcium]     Medication list has been reviewed and updated.  Current Outpatient Prescriptions on File Prior to Visit  Medication Sig Dispense Refill  . FLUoxetine (PROZAC) 20 MG tablet Take 2 tablets (40 mg total) by mouth daily.  60 tablet  2  . fluticasone (FLONASE) 50 MCG/ACT nasal spray Place 2 sprays into both nostrils daily.  16 g  6  . HYDROcodone-acetaminophen (NORCO/VICODIN) 5-325 MG per tablet Take 0.5-1 tablets by mouth every 6 (six) hours as needed.  20 tablet  0  . latanoprost (XALATAN) 0.005 % ophthalmic solution Place 1 drop into both eyes at bedtime.      Marland Kitchen levothyroxine (SYNTHROID) 100 MCG tablet Take 1 tablet (100 mcg total) by mouth daily before breakfast.  30 tablet  11  . losartan (COZAAR) 100 MG tablet Take 1 tablet (100 mg total) by mouth daily. PATIENT NEEDS OFFICE VISIT FOR ADDITIONAL REFILLS  90 tablet  3  . pravastatin (PRAVACHOL) 80 MG tablet Take 0.5 tablets (40 mg total) by mouth daily.  3 tablet  3  . PRESCRIPTION MEDICATION Ventolin inhaler taking      . amoxicillin-clavulanate (AUGMENTIN) 875-125 MG per tablet Take 1 tablet by mouth 2 (two) times daily.  20 tablet  0  . FLUoxetine (PROZAC) 20 MG tablet Take 1.5 tablets (30 mg total) by mouth daily. PATIENT NEEDS OFFICE VISIT FOR ADDITIONAL REFILLS  45 tablet  0  . gabapentin (NEURONTIN) 100 MG capsule Take 2 capsules (200 mg total) by mouth at bedtime.  40 capsule  0  . guaiFENesin-dextromethorphan (ROBITUSSIN DM) 100-10 MG/5ML syrup Take 5 mLs by mouth every 4 (four) hours as needed for cough.  118 mL  0  . valACYclovir (VALTREX) 1000 MG tablet Take 1 tablet (1,000 mg total) by mouth 3 (three) times daily.  21 tablet  0   No current facility-administered medications on file prior to visit.    Review of Systems:  As per HPI, otherwise negative.     Physical Examination: Filed Vitals:   12/25/13 1443  BP: 132/74  Pulse: 64  Temp: 98.1 F (36.7 C)  Resp: 16   Filed Vitals:   12/25/13 1443  Height: 5' 1.5" (1.562 m)  Weight: 236 lb (107.049 kg)   Body mass index is 43.88 kg/(m^2). Ideal Body Weight: Weight in (lb) to have BMI = 25: 134.2  GEN: WDWN, NAD, Non-toxic, A & O x 3 HEENT: Atraumatic, Normocephalic. Neck supple and not tender. No masses, No LAD. Ears and Nose: No external deformity. CV: RRR, No M/G/R. No JVD. No thrill. No extra heart sounds. PULM: CTA B, no wheezes, crackles, rhonchi. No retractions. No resp. distress. No accessory muscle use. ABD: S, NT, ND, +BS. No rebound. No HSM. EXTR: No c/c/e.  Right mid foot tender and swollen. NEURO antalgic gait.  PSYCH: Normally interactive. Conversant. Not depressed or anxious appearing.  Calm demeanor.  BACK:  Tender left para spinous muscle.  Assessment and Plan: Contusion foot Contusion back Ultram Flexeril  Signed,  Ellison Carwin, MD   [facility] reading (PRIMARY) by  Dr. Ouida Sills.  Negative L Spine.  [facility] reading (PRIMARY) by  Dr. Ouida Sills.  Negative foot.  [facility] reading (PRIMARY) by  Dr. Ouida Sills.  Negative ankle.

## 2013-12-25 NOTE — Patient Instructions (Signed)

## 2014-01-05 ENCOUNTER — Ambulatory Visit (INDEPENDENT_AMBULATORY_CARE_PROVIDER_SITE_OTHER): Payer: Medicare Other | Admitting: Emergency Medicine

## 2014-01-05 VITALS — BP 138/84 | HR 64 | Temp 97.5°F | Resp 15 | Ht 63.0 in | Wt 235.4 lb

## 2014-01-05 DIAGNOSIS — D649 Anemia, unspecified: Secondary | ICD-10-CM

## 2014-01-05 DIAGNOSIS — M7989 Other specified soft tissue disorders: Secondary | ICD-10-CM

## 2014-01-05 DIAGNOSIS — Z79899 Other long term (current) drug therapy: Secondary | ICD-10-CM

## 2014-01-05 DIAGNOSIS — R700 Elevated erythrocyte sedimentation rate: Secondary | ICD-10-CM

## 2014-01-05 DIAGNOSIS — R51 Headache: Secondary | ICD-10-CM

## 2014-01-05 DIAGNOSIS — E785 Hyperlipidemia, unspecified: Secondary | ICD-10-CM

## 2014-01-05 LAB — IRON AND TIBC
%SAT: 19 % — ABNORMAL LOW (ref 20–55)
Iron: 59 ug/dL (ref 42–145)
TIBC: 305 ug/dL (ref 250–470)
UIBC: 246 ug/dL (ref 125–400)

## 2014-01-05 LAB — POCT CBC
Granulocyte percent: 62.9 %G (ref 37–80)
HCT, POC: 34.4 % — AB (ref 37.7–47.9)
Hemoglobin: 10.3 g/dL — AB (ref 12.2–16.2)
Lymph, poc: 1.5 (ref 0.6–3.4)
MCH, POC: 25 pg — AB (ref 27–31.2)
MCHC: 29.9 g/dL — AB (ref 31.8–35.4)
MCV: 83.5 fL (ref 80–97)
MID (cbc): 0.4 (ref 0–0.9)
MPV: 7 fL (ref 0–99.8)
POC Granulocyte: 3.1 (ref 2–6.9)
POC LYMPH PERCENT: 29.8 %L (ref 10–50)
POC MID %: 7.3 %M (ref 0–12)
Platelet Count, POC: 308 10*3/uL (ref 142–424)
RBC: 4.12 M/uL (ref 4.04–5.48)
RDW, POC: 15.4 %
WBC: 4.9 10*3/uL (ref 4.6–10.2)

## 2014-01-05 LAB — LIPID PANEL
Cholesterol: 249 mg/dL — ABNORMAL HIGH (ref 0–200)
HDL: 61 mg/dL (ref >39)
LDL Cholesterol: 167 mg/dL — ABNORMAL HIGH (ref 0–99)
Total CHOL/HDL Ratio: 4.1 Ratio
Triglycerides: 106 mg/dL (ref <150)
VLDL: 21 mg/dL (ref 0–40)

## 2014-01-05 LAB — FERRITIN: Ferritin: 76 ng/mL (ref 10–291)

## 2014-01-05 LAB — POCT SEDIMENTATION RATE: POCT SED RATE: 56 mm/hr — AB (ref 0–22)

## 2014-01-05 NOTE — Progress Notes (Addendum)
Subjective:    Patient ID: Alyssa Jefferson, female    DOB: [birth year], 68 y.o.   MRN: [id number] This chart was scribed for Darlyne Russian, MD by Anastasia Pall, ED Scribe. This patient was seen in room 09 and the patient's care was started at 11:48 AM.  Chief Complaint  Patient presents with  . Foot Swelling    x 3 weeks, think its cholesterol medication  . Follow-up    recheck cholesterol   HPI Alyssa Jefferson is a 68 y.o. female Pt is here for a cholesterol recheck and presents with LE swelling, onset 3 weeks ago, that she thinks is related to her cholesterol medication. She reports thinking this as the cholesterol medication was the last medication she took before her swelling began. She is able to ambulate, but reports tightness in her legs. She denies any recent long distance tavel. She denies swelling in her hands. She denies wearing stockings. She reports watching her salt intake. She reports she has not eaten anything yet today.   Pt had recent fall, sustained back injury. Was seen by Dr. Ouida Sills, given Ultram and Flexeril. Pt reports her back has improved since then.She denies any other symptoms. She reports an allergy to Lipitor, stating it makes her feet itch.   Pt had recent elevated Sed rates. She had normal thyroid results.   PCP - Jenny Reichmann, MD  Patient Active Problem List   Diagnosis Date Noted  . OSA (obstructive sleep apnea) 10/16/2012  . BMI 40.0-44.9, adult 12/20/2011  . Hypothyroid 12/20/2011  . 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 Osteoarthritis, knee 12/20/2011  . Other and unspecified hyperlipidemia 12/20/2011  . Chronic depression 12/20/2011  . Uncontrolled hypertension as indication for native nephrectomy 12/20/2011  . Anemia 12/20/2011  . High cholesterol   . Cancer    Prior to Admission medications   Medication Sig Start Date End Date Taking? Authorizing Provider  FLUoxetine (PROZAC) 20 MG tablet Take 1.5 tablets  (30 mg total) by mouth daily. PATIENT NEEDS OFFICE VISIT FOR ADDITIONAL REFILLS   Yes Darlyne Russian, MD  FLUoxetine (PROZAC) 20 MG tablet Take 2 tablets (40 mg total) by mouth daily. 10/23/13  Yes Ellison Carwin, MD  fluticasone (FLONASE) 50 MCG/ACT nasal spray Place 2 sprays into both nostrils daily. 10/23/13  Yes Ellison Carwin, MD  gabapentin (NEURONTIN) 100 MG capsule Take 2 capsules (200 mg total) by mouth at bedtime. 06/10/13  Yes Shawnee Knapp, MD  latanoprost (XALATAN) 0.005 % ophthalmic solution Place 1 drop into both eyes at bedtime.   Yes Historical Provider, MD  levothyroxine (SYNTHROID) 100 MCG tablet Take 1 tablet (100 mcg total) by mouth daily before breakfast. 10/24/13  Yes Ellison Carwin, MD  losartan (COZAAR) 100 MG tablet Take 1 tablet (100 mg total) by mouth daily. PATIENT NEEDS OFFICE VISIT FOR ADDITIONAL REFILLS 10/23/13  Yes Ellison Carwin, MD  pravastatin (PRAVACHOL) 80 MG tablet Take 0.5 tablets (40 mg total) by mouth daily. 10/24/13  Yes Ellison Carwin, MD  PRESCRIPTION MEDICATION Ventolin inhaler taking   Yes Historical Provider, MD  valACYclovir (VALTREX) 1000 MG tablet Take 1 tablet (1,000 mg total) by mouth 3 (three) times daily. 06/10/13  Yes Shawnee Knapp, MD  amoxicillin-clavulanate (AUGMENTIN) 875-125 MG per tablet Take 1 tablet by mouth 2 (two) times daily. 10/23/13   Ellison Carwin, MD  cyclobenzaprine (FLEXERIL) 10 MG tablet Take 1 tablet (10 mg total) by mouth 3 (three) times daily as  needed for muscle spasms. 12/25/13   Ellison Carwin, MD  guaiFENesin-dextromethorphan (ROBITUSSIN DM) 100-10 MG/5ML syrup Take 5 mLs by mouth every 4 (four) hours as needed for cough. 10/23/13   Ellison Carwin, MD  HYDROcodone-acetaminophen (NORCO/VICODIN) 5-325 MG per tablet Take 0.5-1 tablets by mouth every 6 (six) hours as needed. 06/20/13   Shawnee Knapp, MD  traMADol (ULTRAM) 50 MG tablet Take 1 tablet (50 mg total) by mouth every 8 (eight) hours as needed. 12/25/13   Ellison Carwin, MD   Review of Systems  Constitutional: Negative for fever.  Cardiovascular: Positive for leg swelling (bilateral LE).  Musculoskeletal: Negative for back pain, gait problem and joint swelling (negative for hand swelling).      Objective:   Physical Exam BP 138/84  Pulse 64  Temp(Src) 97.5 F (36.4 C) (Oral)  Resp 15  Ht 5\' 3"  (1.6 m)  Wt 235 lb 6.4 oz (106.777 kg)  BMI 41.71 kg/m2  SpO2 95%  Nursing note and vitals reviewed. Constitutional: He is oriented to person, place, and time. He appears well-developed and well-nourished. No distress.  HENT:  Head: Normocephalic and atraumatic.  Eyes: EOM are normal.  Neck: Neck supple.  Cardiovascular: Normal rate.   Pulmonary/Chest: Effort normal. No respiratory distress.  Musculoskeletal: Normal range of motion.  Neurological: He is alert and oriented to person, place, and time.  Skin: Skin is warm and dry.  Psychiatric: He has a normal mood and affect. His behavior is normal.    EXT revealed healed scars over both great toes. I could not elicit any edema. The calves are normal  Results for orders placed in visit on 01/05/14  POCT CBC      Result Value Ref Range   WBC 4.9  4.6 - 10.2 K/uL   Lymph, poc 1.5  0.6 - 3.4   POC LYMPH PERCENT 29.8  10 - 50 %L   MID (cbc) 0.4  0 - 0.9   POC MID % 7.3  0 - 12 %M   POC Granulocyte 3.1  2 - 6.9   Granulocyte percent 62.9  37 - 80 %G   RBC 4.12  4.04 - 5.48 M/uL   Hemoglobin 10.3 (*) 12.2 - 16.2 g/dL   HCT, POC 34.4 (*) 37.7 - 47.9 %   MCV 83.5  80 - 97 fL   MCH, POC 25.0 (*) 27 - 31.2 pg   MCHC 29.9 (*) 31.8 - 35.4 g/dL   RDW, POC 15.4     Platelet Count, POC 308  142 - 424 K/uL   MPV 7.0  0 - 99.8 fL   . Assessment & Plan:   Patient does not show edema on her exam today. I recommended we place her pravastatin on hold. Recheck her cholesterol today. Recent TSH was normal. Percent rate was elevated at 60. 2 months ago we'll go ahead and repeat this. I would recommend  she resume the cholesterol medicine in 2 weeks. Hemoglobin has dropped a gram. We'll go ahead and check iron studies and pull her paper chart to see when her last colonoscopy was patient also describes a one-year history of a left temporal headache and with her anemia and elevated sedimentation rate we'll refer to rheumatology for evaluation to rule out temporal arteritis. CT scan will be ordered.. The colonoscopy was done in 2009 with findings of diverticulosis only.    I personally performed the services described in this documentation, which was scribed in my presence. The recorded information has  been reviewed and is accurate.

## 2014-01-10 ENCOUNTER — Telehealth: Payer: Self-pay | Admitting: *Deleted

## 2014-01-10 NOTE — Telephone Encounter (Signed)
Returning call left on lab vm . LMOM for pt to return call concerning lab results from 5/23

## 2014-01-10 NOTE — Telephone Encounter (Signed)
Pt called back in regards to labs. Went over labs with her and advised her about her cholesterol and watching her diet .

## 2014-02-01 ENCOUNTER — Other Ambulatory Visit: Payer: Medicare Other

## 2014-02-01 ENCOUNTER — Ambulatory Visit
Admission: RE | Admit: 2014-02-01 | Discharge: 2014-02-01 | Disposition: A | Discharge status: Home / Self Care | Payer: Medicare Other | Source: Ambulatory Visit | Attending: Emergency Medicine | Admitting: Emergency Medicine

## 2014-02-01 DIAGNOSIS — R51 Headache: Secondary | ICD-10-CM

## 2014-02-06 ENCOUNTER — Telehealth: Payer: Self-pay

## 2014-02-06 DIAGNOSIS — M26609 Unspecified temporomandibular joint disorder, unspecified side: Principal | ICD-10-CM

## 2014-02-06 DIAGNOSIS — M26659 Arthropathy of unspecified temporomandibular joint: Secondary | ICD-10-CM

## 2014-02-06 NOTE — Telephone Encounter (Signed)
Dr.Daub/nuce supposed to get an oral surgeon to do "something" to his tooth, said Dr. Terence Lux doesn't do all of the surgeries that he used to do, and would like to be advised on what to do

## 2014-02-07 NOTE — Telephone Encounter (Signed)
LMOM for pt to call back with more information regarding the oral issue.  I don't see anywhere in the notes where this had been addressed.

## 2014-02-09 NOTE — Telephone Encounter (Signed)
Spoke with pt. See CT report. We were going to refer her to an oral surgeon but she told us that she already had one, but when she called him, he said he was only doing tooth extractions. So she does need [place] to make a referral for her. Referral placed.

## 2014-02-09 NOTE — Telephone Encounter (Signed)
Left message on machine to call back on both numbers 

## 2014-03-04 ENCOUNTER — Telehealth: Payer: Self-pay

## 2014-03-06 ENCOUNTER — Encounter: Payer: Self-pay | Admitting: Radiology

## 2014-03-06 NOTE — Telephone Encounter (Signed)
Pt want xrays that were done by Dr. Everlene Farrier to be sent to Dr. Nicki Reaper minor at [facility] on [address]. She said she does not have the information to the [facility] office, and would prefer for a nurse to give her a call, and she can speak with them about her "needs'. Please advice

## 2014-03-06 NOTE — Telephone Encounter (Signed)
Called pt- she will stop by to pick up copy of the Xray's.  Gave pt information to Xray to burn a disc and put in the pick up drawer.

## 2014-04-18 ENCOUNTER — Other Ambulatory Visit: Payer: Self-pay | Admitting: Emergency Medicine

## 2014-05-15 ENCOUNTER — Ambulatory Visit (INDEPENDENT_AMBULATORY_CARE_PROVIDER_SITE_OTHER): Payer: Medicare Other | Admitting: Family Medicine

## 2014-05-15 VITALS — BP 150/70 | HR 66 | Temp 98.5°F | Resp 16 | Ht 61.25 in | Wt 238.0 lb

## 2014-05-15 DIAGNOSIS — Z79899 Other long term (current) drug therapy: Secondary | ICD-10-CM

## 2014-05-15 DIAGNOSIS — F329 Major depressive disorder, single episode, unspecified: Secondary | ICD-10-CM

## 2014-05-15 DIAGNOSIS — J029 Acute pharyngitis, unspecified: Secondary | ICD-10-CM

## 2014-05-15 DIAGNOSIS — K219 Gastro-esophageal reflux disease without esophagitis: Secondary | ICD-10-CM

## 2014-05-15 DIAGNOSIS — E785 Hyperlipidemia, unspecified: Secondary | ICD-10-CM

## 2014-05-15 DIAGNOSIS — D649 Anemia, unspecified: Secondary | ICD-10-CM

## 2014-05-15 DIAGNOSIS — F3289 Other specified depressive episodes: Secondary | ICD-10-CM

## 2014-05-15 DIAGNOSIS — J301 Allergic rhinitis due to pollen: Secondary | ICD-10-CM

## 2014-05-15 DIAGNOSIS — E039 Hypothyroidism, unspecified: Secondary | ICD-10-CM

## 2014-05-15 LAB — POCT CBC
Granulocyte percent: 64.8 %G (ref 37–80)
HCT, POC: 36.7 % — AB (ref 37.7–47.9)
Hemoglobin: 11.2 g/dL — AB (ref 12.2–16.2)
Lymph, poc: 1.8 (ref 0.6–3.4)
MCH, POC: 25.3 pg — AB (ref 27–31.2)
MCHC: 30.5 g/dL — AB (ref 31.8–35.4)
MCV: 83 fL (ref 80–97)
MID (cbc): 0.2 (ref 0–0.9)
MPV: 6.2 fL (ref 0–99.8)
POC Granulocyte: 3.6 (ref 2–6.9)
POC LYMPH PERCENT: 31.3 %L (ref 10–50)
POC MID %: 3.9 %M (ref 0–12)
Platelet Count, POC: 345 10*3/uL (ref 142–424)
RBC: 4.43 M/uL (ref 4.04–5.48)
RDW, POC: 15.3 %
WBC: 5.6 10*3/uL (ref 4.6–10.2)

## 2014-05-15 LAB — POCT SEDIMENTATION RATE: POCT SED RATE: 56 mm/hr — AB (ref 0–22)

## 2014-05-15 MED ORDER — PANTOPRAZOLE SODIUM 40 MG PO TBEC: 40.0000 mg | DELAYED_RELEASE_TABLET | Freq: Every day | ORAL | Status: DC

## 2014-05-15 MED ORDER — AZELASTINE HCL 0.15 % NA SOLN: 2.0000 | Freq: Two times a day (BID) | NASAL | Status: DC

## 2014-05-15 MED ORDER — FLUTICASONE PROPIONATE 50 MCG/ACT NA SUSP: 2.0000 | Freq: Every day | NASAL | Status: DC

## 2014-05-15 MED ORDER — GUAIFENESIN ER 1200 MG PO TB12: 1.0000 | ORAL_TABLET | Freq: Two times a day (BID) | ORAL | Status: DC | PRN

## 2014-05-15 MED ORDER — FIRST-DUKES MOUTHWASH MT SUSP: 5.0000 mL | OROMUCOSAL | Status: DC | PRN

## 2014-05-15 MED ORDER — BUSPIRONE HCL 7.5 MG PO TABS: 7.5000 mg | ORAL_TABLET | Freq: Two times a day (BID) | ORAL | Status: DC

## 2014-05-15 NOTE — Patient Instructions (Addendum)
Decrease prozac (fluoxetine) to one tab daily. After 1 wk, take 1/2 tab if you can x 1 wk, then stop. Try the buspar as needed for anxiety. If your mood becomes worse, let us know and we could try some citalopram (celexa) or effexor.  Allergic Rhinitis Allergic rhinitis is when the mucous membranes in the nose respond to allergens. Allergens are particles in the air that cause your body to have an allergic reaction. This causes you to release allergic antibodies. Through a chain of events, these eventually cause you to release histamine into the blood stream. Although meant to protect the body, it is this release of histamine that causes your discomfort, such as frequent sneezing, congestion, and an itchy, runny nose.  CAUSES  Seasonal allergic rhinitis (hay fever) is caused by pollen allergens that may come from grasses, trees, and weeds. Year-round allergic rhinitis (perennial allergic rhinitis) is caused by allergens such as house dust mites, pet dander, and mold spores.  SYMPTOMS   Nasal stuffiness (congestion).  Itchy, runny nose with sneezing and tearing of the eyes. DIAGNOSIS  Your health care provider can help you determine the allergen or allergens that trigger your symptoms. If you and your health care provider are unable to determine the allergen, skin or blood testing may be used. TREATMENT  Allergic rhinitis does not have a cure, but it can be controlled by:  Medicines and allergy shots (immunotherapy).  Avoiding the allergen. Hay fever may often be treated with antihistamines in pill or nasal spray forms. Antihistamines block the effects of histamine. There are over-the-counter medicines that may help with nasal congestion and swelling around the eyes. Check with your health care provider before taking or giving this medicine.  If avoiding the allergen or the medicine prescribed do not work, there are many new medicines your health care provider can prescribe. Stronger medicine may  be used if initial measures are ineffective. Desensitizing injections can be used if medicine and avoidance does not work. Desensitization is when a patient is given ongoing shots until the body becomes less sensitive to the allergen. Make sure you follow up with your health care provider if problems continue. HOME CARE INSTRUCTIONS It is not possible to completely avoid allergens, but you can reduce your symptoms by taking steps to limit your exposure to them. It helps to know exactly what you are allergic to so that you can avoid your specific triggers. SEEK MEDICAL CARE IF:   You have a fever.  You develop a cough that does not stop easily (persistent).  You have shortness of breath.  You start wheezing.  Symptoms interfere with normal daily activities. Document Released: 04/27/2001 Document Revised: 08/07/2013 Document Reviewed: 04/09/2013 [facility] Patient Information 2015 [place], [place]. This information is not intended to replace advice given to you by your health care provider. Make sure you discuss any questions you have with your health care provider. Pharyngitis Pharyngitis is redness, pain, and swelling (inflammation) of your pharynx.  CAUSES  Pharyngitis is usually caused by infection. Most of the time, these infections are from viruses (viral) and are part of a cold. However, sometimes pharyngitis is caused by bacteria (bacterial). Pharyngitis can also be caused by allergies. Viral pharyngitis may be spread from person to person by coughing, sneezing, and personal items or utensils (cups, forks, spoons, toothbrushes). Bacterial pharyngitis may be spread from person to person by more intimate contact, such as kissing.  SIGNS AND SYMPTOMS  Symptoms of pharyngitis include:   Sore throat.  Tiredness (fatigue).   Low-grade fever.   Headache.  Joint pain and muscle aches.  Skin rashes.  Swollen lymph nodes.  Plaque-like film on throat or tonsils (often seen with  bacterial pharyngitis). DIAGNOSIS  Your health care provider will ask you questions about your illness and your symptoms. Your medical history, along with a physical exam, is often all that is needed to diagnose pharyngitis. Sometimes, a rapid strep test is done. Other lab tests may also be done, depending on the suspected cause.  TREATMENT  Viral pharyngitis will usually get better in 3-4 days without the use of medicine. Bacterial pharyngitis is treated with medicines that kill germs (antibiotics).  HOME CARE INSTRUCTIONS   Drink enough water and fluids to keep your urine clear or pale yellow.   Only take over-the-counter or prescription medicines as directed by your health care provider:   If you are prescribed antibiotics, make sure you finish them even if you start to feel better.   Do not take aspirin.   Get lots of rest.   Gargle with 8 oz of salt water ( tsp of salt per 1 qt of water) as often as every 1-2 hours to soothe your throat.   Throat lozenges (if you are not at risk for choking) or sprays may be used to soothe your throat. SEEK MEDICAL CARE IF:   You have large, tender lumps in your neck.  You have a rash.  You cough up green, yellow-brown, or bloody spit. SEEK IMMEDIATE MEDICAL CARE IF:   Your neck becomes stiff.  You drool or are unable to swallow liquids.  You vomit or are unable to keep medicines or liquids down.  You have severe pain that does not go away with the use of recommended medicines.  You have trouble breathing (not caused by a stuffy nose). MAKE SURE YOU:   Understand these instructions.  Will watch your condition.  Will get help right away if you are not doing well or get worse. Document Released: 08/02/2005 Document Revised: 05/23/2013 Document Reviewed: 04/09/2013 [facility] Patient Information 2015 [place], [place]. This information is not intended to replace advice given to you by your health care provider. Make sure you discuss  any questions you have with your health care provider.

## 2014-05-15 NOTE — Progress Notes (Signed)
Subjective:  This chart was scribed for Delman Cheadle, MD by Ladene Artist, ED Scribe. The patient was seen in room 10. Patient's care was started at 1:41 PM.   Patient ID: Alyssa Jefferson, female    DOB: Nov 14, 1945, 68 y.o.   MRN: [id number]  Chief Complaint  Patient presents with  . Rash    on left side of neck. Noticed the rash over a year ago. Very itchy. No pain associated with rash.   . Sore Throat    x 1wk   HPI HPI Comments: Alyssa Jefferson is a 68 y.o. female, with a h/o HTN, high cholesterol, CA, who presents to the [facility] complaining of sore throat onset 1 week ago. Pt reports thick, yellow colored sputum. Pt reports that she had blood in sputum one time. She reports associated intermittent sinus pressure and occasional indigestion with certain foods. She denies cough, fever, chills, ear pain, rhinorrhea, postnasal drip, swollen glands, difficulty swallowing. Pt has tried Listerine and Flonase without relief.   She reports rash noted to L side of neck over the past year.   Pt also presents with increased anxiety and states that she is on edge more often. She denies panic attacks, night mares, difficulty sleeping. Pt states that she likes to read and has things that she looks forward to doing. She states that reading helps her to relax. Pt takes Prozac daily. Pt has tried Zoloft in the past.   Past Medical History  Diagnosis Date  . High cholesterol   . Hypertension   . Depression   . Cancer 2004    breast  . OSA (obstructive sleep apnea)   . Glaucoma   . Thyroid disease     thyroid removed   Current Outpatient Prescriptions on File Prior to Visit  Medication Sig Dispense Refill  . FLUoxetine (PROZAC) 20 MG capsule Take 2 capsules (40 mg total) by mouth daily. PATIENT NEEDS OFFICE VISIT FOR ADDITIONAL REFILLS  60 capsule  0  . FLUoxetine (PROZAC) 20 MG tablet Take 1.5 tablets (30 mg total) by mouth daily. PATIENT NEEDS OFFICE VISIT FOR ADDITIONAL  REFILLS  45 tablet  0  . FLUoxetine (PROZAC) 20 MG tablet Take 2 tablets (40 mg total) by mouth daily.  60 tablet  2  . fluticasone (FLONASE) 50 MCG/ACT nasal spray Place 2 sprays into both nostrils daily.  16 g  6  . latanoprost (XALATAN) 0.005 % ophthalmic solution Place 1 drop into both eyes at bedtime.      Marland Kitchen levothyroxine (SYNTHROID) 100 MCG tablet Take 1 tablet (100 mcg total) by mouth daily before breakfast.  30 tablet  11  . losartan (COZAAR) 100 MG tablet Take 1 tablet (100 mg total) by mouth daily. PATIENT NEEDS OFFICE VISIT FOR ADDITIONAL REFILLS  90 tablet  3  . pravastatin (PRAVACHOL) 80 MG tablet Take 0.5 tablets (40 mg total) by mouth daily.  3 tablet  3  . PRESCRIPTION MEDICATION Ventolin inhaler taking      . traMADol (ULTRAM) 50 MG tablet Take 1 tablet (50 mg total) by mouth every 8 (eight) hours as needed.  30 tablet  0  . amoxicillin-clavulanate (AUGMENTIN) 875-125 MG per tablet Take 1 tablet by mouth 2 (two) times daily.  20 tablet  0  . cyclobenzaprine (FLEXERIL) 10 MG tablet Take 1 tablet (10 mg total) by mouth 3 (three) times daily as needed for muscle spasms.  30 tablet  0  . gabapentin (NEURONTIN)  100 MG capsule Take 2 capsules (200 mg total) by mouth at bedtime.  40 capsule  0  . guaiFENesin-dextromethorphan (ROBITUSSIN DM) 100-10 MG/5ML syrup Take 5 mLs by mouth every 4 (four) hours as needed for cough.  118 mL  0  . HYDROcodone-acetaminophen (NORCO/VICODIN) 5-325 MG per tablet Take 0.5-1 tablets by mouth every 6 (six) hours as needed.  20 tablet  0  . valACYclovir (VALTREX) 1000 MG tablet Take 1 tablet (1,000 mg total) by mouth 3 (three) times daily.  21 tablet  0   No current facility-administered medications on file prior to visit.   Allergies  Allergen Reactions  . Codeine   . Lipitor [Atorvastatin Calcium]    Review of Systems  Constitutional: Negative for fever and chills.  HENT: Positive for sinus pressure (intermittent) and sore throat. Negative for ear  pain, postnasal drip, rhinorrhea and trouble swallowing.   Respiratory: Negative for cough.   Skin: Positive for rash.  Psychiatric/Behavioral: Negative for sleep disturbance. The patient is nervous/anxious.    Triage Vitals: BP 150/70  Pulse 66  Temp(Src) 98.5 F (36.9 C) (Oral)  Resp 16  Ht 5' 1.25" (1.556 m)  Wt 238 lb (107.956 kg)  BMI 44.59 kg/m2  SpO2 98%    Objective:   Physical Exam  Nursing note and vitals reviewed. Constitutional: She is oriented to person, place, and time. She appears well-developed and well-nourished. No distress.  HENT:  Head: Normocephalic and atraumatic.  Right Ear: Tympanic membrane is retracted. No middle ear effusion.  Left Ear: Tympanic membrane is retracted.  No middle ear effusion.  Nose: Mucosal edema present.  Mouth/Throat: No posterior oropharyngeal edema or posterior oropharyngeal erythema.  Nose: Pale, boggy Mouth: Fair amount of postnasal drip  Eyes: Conjunctivae and EOM are normal.  Neck: Neck supple. No tracheal deviation present.  Cardiovascular: Normal rate, regular rhythm, S1 normal, S2 normal and normal heart sounds.   No murmur heard. Pulmonary/Chest: Effort normal and breath sounds normal. No respiratory distress.  Musculoskeletal: Normal range of motion.  Lymphadenopathy:       Head (right side): Tonsillar adenopathy present. No preauricular and no posterior auricular adenopathy present.       Head (left side): Tonsillar adenopathy present. No preauricular and no posterior auricular adenopathy present.    She has no cervical adenopathy.       Right cervical: No posterior cervical adenopathy present.      Left cervical: No posterior cervical adenopathy present.       Right: No supraclavicular adenopathy present.       Left: No supraclavicular adenopathy present.  Neurological: She is alert and oriented to person, place, and time.  Skin: Skin is warm and dry.  Psychiatric: She has a normal mood and affect. Her behavior is  normal.      Assessment & Plan:   Other and unspecified hyperlipidemia - Plan: Lipid panel - ldl improved form 150s to 120s on pravastatin 80 but 10 yr cardiac risk still 14.8%.  Allergy to atorvastatin but recommend trial of crestor. If does not tolerate, switch back to pravastatin but add in coenzyme q10  Hypothyroidism, unspecified hypothyroidism type - Plan: TSH - stable, cont current dose.  Chronic depression - Plan: POCT SEDIMENTATION RATE - wean off prozac as ineffective and try buspar. If sxs worsen, consider trial of citalopram  Anemia, unspecified anemia type - Plan: Vitamin B12, Folate, Reticulocytes, POCT CBC, POCT SEDIMENTATION RATE - unkown etiology  Encounter for long-term (current) use of other medications -  Plan: Comprehensive metabolic panel  Allergic rhinitis due to pollen - Plan: fluticasone (FLONASE) 50 MCG/ACT nasal spray  Acute pharyngitis, unspecified pharyngitis type  Gastroesophageal reflux disease, esophagitis presence not specified  Meds ordered this encounter  Medications  . busPIRone (BUSPAR) 7.5 MG tablet    Sig: Take 1 tablet (7.5 mg total) by mouth 2 (two) times daily.    Dispense:  60 tablet    Refill:  2  . Diphenhyd-Hydrocort-Nystatin (FIRST-DUKES MOUTHWASH) SUSP    Sig: Use as directed 5 mLs in the mouth or throat every 2 (two) hours as needed (sore throat).    Dispense:  237 mL    Refill:  0    Ok to use your pharmacies formulary  . Azelastine HCl 0.15 % SOLN    Sig: Place 2 sprays into the nose 2 (two) times daily.    Dispense:  30 mL    Refill:  1  . fluticasone (FLONASE) 50 MCG/ACT nasal spray    Sig: Place 2 sprays into both nostrils daily.    Dispense:  16 g    Refill:  6  . Guaifenesin (MUCINEX MAXIMUM STRENGTH) 1200 MG TB12    Sig: Take 1 tablet (1,200 mg total) by mouth every 12 (twelve) hours as needed.    Dispense:  14 tablet    Refill:  1  . pantoprazole (PROTONIX) 40 MG tablet    Sig: Take 1 tablet (40 mg total) by mouth  daily.    Dispense:  30 tablet    Refill:  3    I personally performed the services described in this documentation, which was scribed in my presence. The recorded information has been reviewed and considered, and addended by me as needed.  Delman Cheadle, MD MPH   Results for orders placed in visit on 05/15/14  LIPID PANEL      Result Value Ref Range   Cholesterol 215 (*) 0 - 200 mg/dL   Triglycerides 81  <150 mg/dL   HDL 66  >39 mg/dL   Total CHOL/HDL Ratio 3.3     VLDL 16  0 - 40 mg/dL   LDL Cholesterol 133 (*) 0 - 99 mg/dL  TSH      Result Value Ref Range   TSH 0.635  0.350 - 4.500 uIU/mL  COMPREHENSIVE METABOLIC PANEL      Result Value Ref Range   Sodium 140  135 - 145 mEq/L   Potassium 4.2  3.5 - 5.3 mEq/L   Chloride 106  96 - 112 mEq/L   CO2 27  19 - 32 mEq/L   Glucose, Bld 73  70 - 99 mg/dL   BUN 14  6 - 23 mg/dL   Creat 0.99  0.50 - 1.10 mg/dL   Total Bilirubin 1.0  0.2 - 1.2 mg/dL   Alkaline Phosphatase 83  39 - 117 U/L   AST 13  0 - 37 U/L   ALT 12  0 - 35 U/L   Total Protein 6.8  6.0 - 8.3 g/dL   Albumin 3.8  3.5 - 5.2 g/dL   Calcium 9.0  8.4 - 10.5 mg/dL  VITAMIN B12      Result Value Ref Range   Vitamin B-12 543  211 - 911 pg/mL  FOLATE      Result Value Ref Range   Folate 8.6    RETICULOCYTES      Result Value Ref Range   Retic Ct Pct 1.6  0.4 - 2.3 %  RBC. 4.33  3.87 - 5.11 MIL/uL   ABS Retic 69.3  19.0 - 186.0 K/uL  POCT CBC      Result Value Ref Range   WBC 5.6  4.6 - 10.2 K/uL   Lymph, poc 1.8  0.6 - 3.4   POC LYMPH PERCENT 31.3  10 - 50 %L   MID (cbc) 0.2  0 - 0.9   POC MID % 3.9  0 - 12 %M   POC Granulocyte 3.6  2 - 6.9   Granulocyte percent 64.8  37 - 80 %G   RBC 4.43  4.04 - 5.48 M/uL   Hemoglobin 11.2 (*) 12.2 - 16.2 g/dL   HCT, POC 36.7 (*) 37.7 - 47.9 %   MCV 83.0  80 - 97 fL   MCH, POC 25.3 (*) 27 - 31.2 pg   MCHC 30.5 (*) 31.8 - 35.4 g/dL   RDW, POC 15.3     Platelet Count, POC 345  142 - 424 K/uL   MPV 6.2  0 - 99.8 fL  POCT  SEDIMENTATION RATE      Result Value Ref Range   POCT SED RATE 56 (*) 0 - 22 mm/hr

## 2014-05-16 LAB — VITAMIN B12: Vitamin B-12: 543 pg/mL (ref 211–911)

## 2014-05-16 LAB — LIPID PANEL
Cholesterol: 215 mg/dL — ABNORMAL HIGH (ref 0–200)
HDL: 66 mg/dL (ref >39)
LDL Cholesterol: 133 mg/dL — ABNORMAL HIGH (ref 0–99)
Total CHOL/HDL Ratio: 3.3 Ratio
Triglycerides: 81 mg/dL (ref <150)
VLDL: 16 mg/dL (ref 0–40)

## 2014-05-16 LAB — COMPREHENSIVE METABOLIC PANEL
ALT: 12 U/L (ref 0–35)
AST: 13 U/L (ref 0–37)
Albumin: 3.8 g/dL (ref 3.5–5.2)
Alkaline Phosphatase: 83 U/L (ref 39–117)
BUN: 14 mg/dL (ref 6–23)
CO2: 27 mEq/L (ref 19–32)
Calcium: 9 mg/dL (ref 8.4–10.5)
Chloride: 106 mEq/L (ref 96–112)
Creat: 0.99 mg/dL (ref 0.50–1.10)
Glucose, Bld: 73 mg/dL (ref 70–99)
Potassium: 4.2 mEq/L (ref 3.5–5.3)
Sodium: 140 mEq/L (ref 135–145)
Total Bilirubin: 1 mg/dL (ref 0.2–1.2)
Total Protein: 6.8 g/dL (ref 6.0–8.3)

## 2014-05-16 LAB — RETICULOCYTES
ABS Retic: 69.3 10*3/uL (ref 19.0–186.0)
RBC.: 4.33 MIL/uL (ref 3.87–5.11)
Retic Ct Pct: 1.6 % (ref 0.4–2.3)

## 2014-05-16 LAB — FOLATE: Folate: 8.6 ng/mL

## 2014-05-16 LAB — TSH: TSH: 0.635 u[IU]/mL (ref 0.350–4.500)

## 2014-05-22 ENCOUNTER — Encounter: Payer: Self-pay | Admitting: Family Medicine

## 2014-05-22 MED ORDER — ROSUVASTATIN CALCIUM 40 MG PO TABS: 40.0000 mg | ORAL_TABLET | Freq: Every day | ORAL | Status: DC

## 2014-05-22 NOTE — Addendum Note (Signed)
Addended by: Delman Cheadle on: 05/22/2014 11:23 PM   Modules accepted: Orders

## 2014-05-28 ENCOUNTER — Other Ambulatory Visit: Payer: Self-pay | Admitting: Orthopedic Surgery

## 2014-06-06 ENCOUNTER — Ambulatory Visit (INDEPENDENT_AMBULATORY_CARE_PROVIDER_SITE_OTHER): Payer: Medicare Other | Admitting: Emergency Medicine

## 2014-06-06 VITALS — BP 132/62 | HR 78 | Temp 98.2°F | Resp 16 | Ht 62.0 in | Wt 237.2 lb

## 2014-06-06 DIAGNOSIS — Z1239 Encounter for other screening for malignant neoplasm of breast: Secondary | ICD-10-CM

## 2014-06-06 DIAGNOSIS — L292 Pruritus vulvae: Secondary | ICD-10-CM

## 2014-06-06 DIAGNOSIS — K625 Hemorrhage of anus and rectum: Secondary | ICD-10-CM

## 2014-06-06 LAB — POCT UA - MICROSCOPIC ONLY
Casts, Ur, LPF, POC: NEGATIVE
Crystals, Ur, HPF, POC: NEGATIVE
Mucus, UA: NEGATIVE
RBC, urine, microscopic: NEGATIVE
Yeast, UA: NEGATIVE

## 2014-06-06 LAB — POCT URINALYSIS DIPSTICK
Bilirubin, UA: NEGATIVE
Blood, UA: NEGATIVE
Glucose, UA: NEGATIVE
Ketones, UA: NEGATIVE
Leukocytes, UA: NEGATIVE
Nitrite, UA: NEGATIVE
Spec Grav, UA: 1.02
Urobilinogen, UA: 0.2
pH, UA: 5.5

## 2014-06-06 LAB — POCT WET PREP WITH KOH
Clue Cells Wet Prep HPF POC: NEGATIVE
KOH Prep POC: NEGATIVE
Trichomonas, UA: NEGATIVE
Yeast Wet Prep HPF POC: NEGATIVE

## 2014-06-06 MED ORDER — ESTROGENS, CONJUGATED 0.625 MG/GM VA CREA: TOPICAL_CREAM | VAGINAL | Status: DC

## 2014-06-06 NOTE — Progress Notes (Signed)
Subjective:    Patient ID: Alyssa Jefferson, female    DOB: 09-10-1945, 68 y.o.   MRN: [id number]  Vaginal Itching The patient's pertinent negatives include no vaginal discharge. Pertinent negatives include no chills, dysuria, fever or frequency.    This is a 68 year old female presenting with 2-3 months of vulvar pruritis. She has not had any vaginal discharge or bleeding, dysuria or vulvar lesions. She is not sexually active, her husband died last month from cancer. She has been using hydrocortisone at night. She tried douching once. She had a similar problem 2.5 years ago. She was treated here for BV and a subclinical yeast infection. When this did not resolve the pruritus she was sent to gynecology and was treated with diflucan 200 mg x 7 days and terazol cream. She states this treatment did not immediately help but her symptoms did eventually resolve. She had a total hysterectomy in the 1980s.  She notes she had some rectal bleeding about 2 months ago but none since. She is due for a colonoscopy. She is also due for a mammogram.  Review of Systems  Constitutional: Negative for fever and chills.  Genitourinary: Negative for dysuria, frequency, vaginal bleeding, vaginal discharge, difficulty urinating, genital sores and vaginal pain.       Vulvar pruritis      Objective:   Physical Exam  Constitutional: She is oriented to person, place, and time. She appears well-developed and well-nourished. No distress.  HENT:  Head: Normocephalic and atraumatic.  Eyes: Conjunctivae and lids are normal. Right eye exhibits no discharge. Left eye exhibits no discharge. No scleral icterus.  Pulmonary/Chest: Effort normal. No respiratory distress.  Abdominal: Soft. Normal appearance. There is no tenderness.  Genitourinary: There is no rash, tenderness or lesion on the right labia. There is no rash, tenderness or lesion on the left labia. No erythema around the vagina. No vaginal discharge found.  Vaginal  atrophy present  Neurological: She is alert and oriented to person, place, and time.  Skin: Skin is warm, dry and intact.  Psychiatric: She has a normal mood and affect. Her speech is normal and behavior is normal. Thought content normal.   Results for orders placed in visit on 06/06/14  POCT WET PREP WITH KOH      Result Value Ref Range   Trichomonas, UA Negative     Clue Cells Wet Prep HPF POC neg     Epithelial Wet Prep HPF POC 6-10     Yeast Wet Prep HPF POC neg     Bacteria Wet Prep HPF POC trace     RBC Wet Prep HPF POC 0-2     WBC Wet Prep HPF POC 4-6     KOH Prep POC Negative    POCT UA - MICROSCOPIC ONLY      Result Value Ref Range   WBC, Ur, HPF, POC 0-1     RBC, urine, microscopic neg     Bacteria, U Microscopic trace     Mucus, UA neg     Epithelial cells, urine per micros 0-4     Crystals, Ur, HPF, POC neg     Casts, Ur, LPF, POC neg     Yeast, UA neg    POCT URINALYSIS DIPSTICK      Result Value Ref Range   Color, UA yellow     Clarity, UA clear     Glucose, UA neg     Bilirubin, UA neg     Ketones,  UA neg     Spec Grav, UA 1.020     Blood, UA neg     pH, UA 5.5     Protein, UA trace     Urobilinogen, UA 0.2     Nitrite, UA neg     Leukocytes, UA Negative        Assessment & Plan:  1. 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 UA and wet prep was negative. Since she has had this problem before and it was not improved with a trial of flagyl, diflucan or terazol cream, will try premarin cream twice a week. She will return in 1 month to see if cream was helpful.  - POCT Wet Prep with KOH - POCT UA - Microscopic Only - POCT urinalysis dipstick - conjugated estrogens (PREMARIN) vaginal cream; Place one applicator at bedtime twice a week.  Dispense: 42.5 g; Refill: 3  2. Screening for breast cancer - [facility] DIGITAL SCREENING BILATERAL; Future  3. Rectal bleeding - Ambulatory referral to Gastroenterology   Benjaman Pott. Drenda Freeze, M[facility]  06/06/2014

## 2014-06-06 NOTE — Patient Instructions (Signed)
Use estrogen cream twice a week at bedtime. Return to clinic in 1 month for follow up.

## 2014-07-02 ENCOUNTER — Other Ambulatory Visit: Payer: Self-pay | Admitting: Orthopedic Surgery

## 2014-07-02 ENCOUNTER — Other Ambulatory Visit: Payer: Self-pay | Admitting: Emergency Medicine

## 2014-07-03 ENCOUNTER — Encounter: Payer: Self-pay | Admitting: Emergency Medicine

## 2014-07-04 ENCOUNTER — Other Ambulatory Visit: Payer: Self-pay | Admitting: Gastroenterology

## 2014-07-04 ENCOUNTER — Other Ambulatory Visit: Payer: Self-pay | Admitting: Radiology

## 2014-07-04 DIAGNOSIS — R9431 Abnormal electrocardiogram [ECG] [EKG]: Secondary | ICD-10-CM

## 2014-07-04 DIAGNOSIS — K625 Hemorrhage of anus and rectum: Secondary | ICD-10-CM

## 2014-07-08 ENCOUNTER — Encounter (HOSPITAL_COMMUNITY): Payer: Self-pay

## 2014-07-08 ENCOUNTER — Encounter (HOSPITAL_COMMUNITY)
Admission: RE | Admit: 2014-07-08 | Discharge: 2014-07-08 | Disposition: A | Discharge status: Home / Self Care | Payer: Medicare Other | Source: Ambulatory Visit | Attending: Orthopedic Surgery | Admitting: Orthopedic Surgery

## 2014-07-08 ENCOUNTER — Ambulatory Visit (HOSPITAL_COMMUNITY)
Admission: RE | Admit: 2014-07-08 | Discharge: 2014-07-08 | Disposition: A | Discharge status: Home / Self Care | Payer: Medicare Other | Source: Ambulatory Visit | Attending: Orthopedic Surgery | Admitting: Orthopedic Surgery

## 2014-07-08 DIAGNOSIS — Z01818 Encounter for other preprocedural examination: Secondary | ICD-10-CM | POA: Diagnosis not present

## 2014-07-08 DIAGNOSIS — M179 Osteoarthritis of knee, unspecified: Secondary | ICD-10-CM | POA: Diagnosis not present

## 2014-07-08 HISTORY — DX: Gastro-esophageal reflux disease without esophagitis: K21.9

## 2014-07-08 HISTORY — DX: Unspecified osteoarthritis, unspecified site: M19.90

## 2014-07-08 HISTORY — DX: Hypothyroidism, unspecified: E03.9

## 2014-07-08 LAB — URINALYSIS, ROUTINE W REFLEX MICROSCOPIC
Bilirubin Urine: NEGATIVE
Glucose, UA: NEGATIVE mg/dL
Hgb urine dipstick: NEGATIVE
Ketones, ur: NEGATIVE mg/dL
Leukocytes, UA: NEGATIVE
Nitrite: NEGATIVE
Protein, ur: NEGATIVE mg/dL
Specific Gravity, Urine: 1.019 (ref 1.005–1.030)
Urobilinogen, UA: 0.2 mg/dL (ref 0.0–1.0)
pH: 5.5 (ref 5.0–8.0)

## 2014-07-08 LAB — CBC WITH DIFFERENTIAL/PLATELET
Basophils Absolute: 0 10*3/uL (ref 0.0–0.1)
Basophils Relative: 0 % (ref 0–1)
Eosinophils Absolute: 0.2 10*3/uL (ref 0.0–0.7)
Eosinophils Relative: 3 % (ref 0–5)
HCT: 35.4 % — ABNORMAL LOW (ref 36.0–46.0)
Hemoglobin: 11.2 g/dL — ABNORMAL LOW (ref 12.0–15.0)
Lymphocytes Relative: 33 % (ref 12–46)
Lymphs Abs: 1.7 10*3/uL (ref 0.7–4.0)
MCH: 25.7 pg — ABNORMAL LOW (ref 26.0–34.0)
MCHC: 31.6 g/dL (ref 30.0–36.0)
MCV: 81.2 fL (ref 78.0–100.0)
Monocytes Absolute: 0.3 10*3/uL (ref 0.1–1.0)
Monocytes Relative: 5 % (ref 3–12)
Neutro Abs: 3.1 10*3/uL (ref 1.7–7.7)
Neutrophils Relative %: 59 % (ref 43–77)
Platelets: 282 10*3/uL (ref 150–400)
RBC: 4.36 MIL/uL (ref 3.87–5.11)
RDW: 13.8 % (ref 11.5–15.5)
WBC: 5.4 10*3/uL (ref 4.0–10.5)

## 2014-07-08 LAB — COMPREHENSIVE METABOLIC PANEL
ALT: 11 U/L (ref 0–35)
AST: 12 U/L (ref 0–37)
Albumin: 3.5 g/dL (ref 3.5–5.2)
Alkaline Phosphatase: 93 U/L (ref 39–117)
Anion gap: 13 (ref 5–15)
BUN: 16 mg/dL (ref 6–23)
CO2: 26 mEq/L (ref 19–32)
Calcium: 9.2 mg/dL (ref 8.4–10.5)
Chloride: 103 mEq/L (ref 96–112)
Creatinine, Ser: 1.13 mg/dL — ABNORMAL HIGH (ref 0.50–1.10)
GFR calc Af Amer: 57 mL/min — ABNORMAL LOW (ref >90)
GFR calc non Af Amer: 49 mL/min — ABNORMAL LOW (ref >90)
Glucose, Bld: 79 mg/dL (ref 70–99)
Potassium: 3.8 mEq/L (ref 3.7–5.3)
Sodium: 142 mEq/L (ref 137–147)
Total Bilirubin: 0.8 mg/dL (ref 0.3–1.2)
Total Protein: 7.6 g/dL (ref 6.0–8.3)

## 2014-07-08 LAB — APTT: aPTT: 35 seconds (ref 24–37)

## 2014-07-08 LAB — TYPE AND SCREEN
ABO/RH(D): A POS
Antibody Screen: NEGATIVE

## 2014-07-08 LAB — PROTIME-INR
INR: 1.06 (ref 0.00–1.49)
Prothrombin Time: 13.9 seconds (ref 11.6–15.2)

## 2014-07-08 LAB — SURGICAL PCR SCREEN
MRSA, PCR: NEGATIVE
Staphylococcus aureus: POSITIVE — AB

## 2014-07-08 LAB — ABO/RH: ABO/RH(D): A POS

## 2014-07-08 NOTE — Progress Notes (Signed)
Call to Pharm. Tech. Due to incomplete list obtained prior to PAT appt.  List completed & updated.

## 2014-07-08 NOTE — Pre-Procedure Instructions (Signed)
Alyssa Jefferson  07/08/2014   Your procedure is scheduled on:  07/19/2014  Report to [facility] Admitting   ENTRANCE  A at 8:30 AM.  Call this number if you have problems the morning of surgery: [phone]   Remember:   Do not eat food or drink liquids after midnight.  On THURSDAY   Take these medicines the morning of surgery with A SIP OF WATER: nasal spray, Fluoxetine, Pantoprazole,buspar, thyroid medicine    Do not wear jewelry, make-up or nail polish.   Do not wear lotions, powders, or perfumes. You may wear deodorant.   Do not shave 48 hours prior to surgery.   Do not bring valuables to the hospital.  [facility] is not responsible                  for any belongings or valuables.               Contacts, dentures or bridgework may not be worn into surgery.   Leave suitcase in the car. After surgery it may be brought to your room.   For patients admitted to the hospital, discharge time is determined by your                treatment team.               Patients discharged the day of surgery will not be allowed to drive  home.  Name and phone number of your driver: SISTER  Special Instructions: Special Instructions: [place] - Preparing for Surgery  Before surgery, you can play an important role.  Because skin is not sterile, your skin needs to be as free of germs as possible.  You can reduce the number of germs on you skin by washing with CHG (chlorahexidine gluconate) soap before surgery.  CHG is an antiseptic cleaner which kills germs and bonds with the skin to continue killing germs even after washing.  Please DO NOT use if you have an allergy to CHG or antibacterial soaps.  If your skin becomes reddened/irritated stop using the CHG and inform your nurse when you arrive at Short Stay.  Do not shave (including legs and underarms) for at least 48 hours prior to the first CHG shower.  You may shave your face.  Please follow these instructions carefully:   1.   Shower with CHG Soap the night before surgery and the  morning of Surgery.  2.  If you choose to wash your hair, wash your hair first as usual with your  normal shampoo.  3.  After you shampoo, rinse your hair and body thoroughly to remove the  Shampoo.  4.  Use CHG as you would any other liquid soap.  You can apply chg directly to the skin and wash gently with scrungie or a clean washcloth.  5.  Apply the CHG Soap to your body ONLY FROM THE NECK DOWN.    Do not use on open wounds or open sores.  Avoid contact with your eyes, ears, mouth and genitals (private parts).  Wash genitals (private parts)   with your normal soap.  6.  Wash thoroughly, paying special attention to the area where your surgery will be performed.  7.  Thoroughly rinse your body with warm water from the neck down.  8.  DO NOT shower/wash with your normal soap after using and rinsing off   the CHG Soap.  9.  Pat yourself dry with a clean  towel.            10.  Wear clean pajamas.            11.  Place clean sheets on your bed the night of your first shower and do not sleep with pets.  Day of Surgery  Do not apply any lotions/deodorants the morning of surgery.  Please wear clean clothes to the hospital/surgery center.   Please read over the following fact sheets that you were given: Pain Booklet, Coughing and Deep Breathing, Blood Transfusion Information, Total Joint Packet, MRSA Information and Surgical Site Infection Prevention

## 2014-07-17 ENCOUNTER — Other Ambulatory Visit: Payer: Medicare Other

## 2014-07-17 ENCOUNTER — Ambulatory Visit
Admission: RE | Admit: 2014-07-17 | Discharge: 2014-07-17 | Disposition: A | Discharge status: Home / Self Care | Payer: Medicare Other | Source: Ambulatory Visit | Attending: Gastroenterology | Admitting: Gastroenterology

## 2014-07-17 DIAGNOSIS — K625 Hemorrhage of anus and rectum: Secondary | ICD-10-CM

## 2014-07-18 ENCOUNTER — Emergency Department (HOSPITAL_COMMUNITY)
Admission: EM | Admit: 2014-07-18 | Discharge: 2014-07-18 | Disposition: A | Discharge status: Home / Self Care | Payer: Medicare Other | Attending: Emergency Medicine | Admitting: Emergency Medicine

## 2014-07-18 ENCOUNTER — Encounter (HOSPITAL_COMMUNITY): Payer: Self-pay | Admitting: *Deleted

## 2014-07-18 ENCOUNTER — Emergency Department (HOSPITAL_COMMUNITY): Payer: Medicare Other

## 2014-07-18 DIAGNOSIS — E780 Pure hypercholesterolemia: Secondary | ICD-10-CM | POA: Diagnosis not present

## 2014-07-18 DIAGNOSIS — K219 Gastro-esophageal reflux disease without esophagitis: Secondary | ICD-10-CM | POA: Insufficient documentation

## 2014-07-18 DIAGNOSIS — I10 Essential (primary) hypertension: Secondary | ICD-10-CM | POA: Insufficient documentation

## 2014-07-18 DIAGNOSIS — Z79899 Other long term (current) drug therapy: Secondary | ICD-10-CM | POA: Diagnosis not present

## 2014-07-18 DIAGNOSIS — Z853 Personal history of malignant neoplasm of breast: Secondary | ICD-10-CM | POA: Insufficient documentation

## 2014-07-18 DIAGNOSIS — H409 Unspecified glaucoma: Secondary | ICD-10-CM | POA: Diagnosis not present

## 2014-07-18 DIAGNOSIS — M1990 Unspecified osteoarthritis, unspecified site: Secondary | ICD-10-CM | POA: Insufficient documentation

## 2014-07-18 DIAGNOSIS — R1010 Upper abdominal pain, unspecified: Secondary | ICD-10-CM | POA: Insufficient documentation

## 2014-07-18 DIAGNOSIS — Z8669 Personal history of other diseases of the nervous system and sense organs: Secondary | ICD-10-CM | POA: Insufficient documentation

## 2014-07-18 DIAGNOSIS — F329 Major depressive disorder, single episode, unspecified: Secondary | ICD-10-CM | POA: Diagnosis not present

## 2014-07-18 DIAGNOSIS — Z90710 Acquired absence of both cervix and uterus: Secondary | ICD-10-CM | POA: Insufficient documentation

## 2014-07-18 DIAGNOSIS — Z87891 Personal history of nicotine dependence: Secondary | ICD-10-CM | POA: Insufficient documentation

## 2014-07-18 DIAGNOSIS — E039 Hypothyroidism, unspecified: Secondary | ICD-10-CM | POA: Insufficient documentation

## 2014-07-18 DIAGNOSIS — R945 Abnormal results of liver function studies: Secondary | ICD-10-CM | POA: Diagnosis not present

## 2014-07-18 DIAGNOSIS — R7989 Other specified abnormal findings of blood chemistry: Secondary | ICD-10-CM

## 2014-07-18 LAB — CBC WITH DIFFERENTIAL/PLATELET
Basophils Absolute: 0 10*3/uL (ref 0.0–0.1)
Basophils Relative: 0 % (ref 0–1)
Eosinophils Absolute: 0.2 10*3/uL (ref 0.0–0.7)
Eosinophils Relative: 4 % (ref 0–5)
HCT: 34.9 % — ABNORMAL LOW (ref 36.0–46.0)
Hemoglobin: 10.7 g/dL — ABNORMAL LOW (ref 12.0–15.0)
Lymphocytes Relative: 28 % (ref 12–46)
Lymphs Abs: 1.5 10*3/uL (ref 0.7–4.0)
MCH: 25 pg — ABNORMAL LOW (ref 26.0–34.0)
MCHC: 30.7 g/dL (ref 30.0–36.0)
MCV: 81.5 fL (ref 78.0–100.0)
Monocytes Absolute: 0.6 10*3/uL (ref 0.1–1.0)
Monocytes Relative: 11 % (ref 3–12)
Neutro Abs: 3 10*3/uL (ref 1.7–7.7)
Neutrophils Relative %: 57 % (ref 43–77)
Platelets: 284 10*3/uL (ref 150–400)
RBC: 4.28 MIL/uL (ref 3.87–5.11)
RDW: 13.9 % (ref 11.5–15.5)
WBC: 5.4 10*3/uL (ref 4.0–10.5)

## 2014-07-18 LAB — COMPREHENSIVE METABOLIC PANEL
ALT: 43 U/L — ABNORMAL HIGH (ref 0–35)
AST: 101 U/L — ABNORMAL HIGH (ref 0–37)
Albumin: 3.6 g/dL (ref 3.5–5.2)
Alkaline Phosphatase: 112 U/L (ref 39–117)
Anion gap: 11 (ref 5–15)
BUN: 19 mg/dL (ref 6–23)
CO2: 28 mEq/L (ref 19–32)
Calcium: 9.4 mg/dL (ref 8.4–10.5)
Chloride: 102 mEq/L (ref 96–112)
Creatinine, Ser: 1.03 mg/dL (ref 0.50–1.10)
GFR calc Af Amer: 64 mL/min — ABNORMAL LOW (ref >90)
GFR calc non Af Amer: 55 mL/min — ABNORMAL LOW (ref >90)
Glucose, Bld: 100 mg/dL — ABNORMAL HIGH (ref 70–99)
Potassium: 4.2 mEq/L (ref 3.7–5.3)
Sodium: 141 mEq/L (ref 137–147)
Total Bilirubin: 0.7 mg/dL (ref 0.3–1.2)
Total Protein: 7.7 g/dL (ref 6.0–8.3)

## 2014-07-18 LAB — URINALYSIS, ROUTINE W REFLEX MICROSCOPIC
Bilirubin Urine: NEGATIVE
Glucose, UA: NEGATIVE mg/dL
Hgb urine dipstick: NEGATIVE
Ketones, ur: NEGATIVE mg/dL
Leukocytes, UA: NEGATIVE
Nitrite: NEGATIVE
Protein, ur: NEGATIVE mg/dL
Specific Gravity, Urine: 1.02 (ref 1.005–1.030)
Urobilinogen, UA: 0.2 mg/dL (ref 0.0–1.0)
pH: 7 (ref 5.0–8.0)

## 2014-07-18 LAB — LIPASE, BLOOD: Lipase: 32 U/L (ref 11–59)

## 2014-07-18 MED ORDER — CEFAZOLIN SODIUM-DEXTROSE 2-3 GM-% IV SOLR: 2.0000 g | INTRAVENOUS | Status: AC

## 2014-07-18 MED ORDER — MORPHINE SULFATE 4 MG/ML IJ SOLN
6.0000 mg | Freq: Once | INTRAMUSCULAR | Status: AC
  Administered 2014-07-18: 6 mg via INTRAVENOUS
  Filled 2014-07-18: qty 2

## 2014-07-18 NOTE — ED Notes (Signed)
thge pt is c/o epigasrtric pain  For one hour with briught red rectal bleeding

## 2014-07-18 NOTE — ED Provider Notes (Signed)
CSN: [id number]     Arrival date & time 07/18/14  4481 History   First MD Initiated Contact with Patient 07/18/14 0422     Chief Complaint  Patient presents with  . Abdominal Pain      HPI Patient presents with sharp upper abdominal pain.  She had one loose stool that had some blood-streaked in it.  She states she's been following up with gastroenterology for some intermittent sharp upper abdominal pain.  She's had an ultrasound in the past 48 hours that demonstrated possible gallstones versus polyps.  She reports nausea without vomiting.  She denies diarrhea.  Pain was moderate in severity and now seems to be improving.   Past Medical History  Diagnosis Date  . High cholesterol   . Hypertension   . Depression   . Cancer 2004    breast  . Glaucoma   . Thyroid disease     thyroid removed  . OSA (obstructive sleep apnea)     tried CPAP- but unsuccessful  . Shortness of breath dyspnea   . Hypothyroidism   . GERD (gastroesophageal reflux disease)   . Arthritis     knees, back   Past Surgical History  Procedure Laterality Date  . Breast surgery  2004    left breast removed due to cancer no chemo or radiation  . Abdominal hysterectomy  83    BSO  . Thyroid removed      due to knot  . Knee arthroscopy Left    Family History  Problem Relation Age of Onset  . Lung cancer Father   . Breast cancer Sister   . Ovarian cancer Sister    History  Substance Use Topics  . Smoking status: Former Smoker -- 20 years    Types: Cigarettes    Quit date: 08/16/1978  . Smokeless tobacco: Never Used     Comment: 1 pack per week--10/16/12  . Alcohol Use: No   OB History    Gravida Para Term Preterm AB TAB SAB Ectopic Multiple Living   0              Review of Systems  All other systems reviewed and are negative.     Allergies  Codeine and Lipitor  Home Medications   Prior to Admission medications   Medication Sig Start Date End Date Taking? Authorizing Provider  Azelastine  HCl 0.15 % SOLN Place 2 sprays into the nose 2 (two) times daily. 05/15/14  Yes Shawnee Knapp, MD  busPIRone (BUSPAR) 7.5 MG tablet Take 1 tablet (7.5 mg total) by mouth 2 (two) times daily. 05/15/14  Yes Shawnee Knapp, MD  FLUoxetine (PROZAC) 20 MG capsule Take 20 mg by mouth every morning.   Yes Historical Provider, MD  fluticasone (FLONASE) 50 MCG/ACT nasal spray Place 2 sprays into both nostrils daily. Patient taking differently: Place 2 sprays into both nostrils daily as needed for allergies or rhinitis.  05/15/14  Yes Shawnee Knapp, MD  Guaifenesin [facility] MAXIMUM STRENGTH) 1200 MG TB12 Take 1 tablet (1,200 mg total) by mouth every 12 (twelve) hours as needed. 05/15/14  Yes Shawnee Knapp, MD  latanoprost (XALATAN) 0.005 % ophthalmic solution Place 1 drop into the right eye at bedtime.    Yes Historical Provider, MD  levothyroxine (SYNTHROID) 100 MCG tablet Take 1 tablet (100 mcg total) by mouth daily before breakfast. 10/24/13  Yes Roselee Culver, MD  losartan (COZAAR) 100 MG tablet Take 1 tablet (100 mg total) by  mouth daily. PATIENT NEEDS OFFICE VISIT FOR ADDITIONAL REFILLS Patient taking differently: Take 100 mg by mouth daily before breakfast. PATIENT NEEDS OFFICE VISIT FOR ADDITIONAL REFILLS 10/23/13  Yes Roselee Culver, MD  pantoprazole (PROTONIX) 40 MG tablet Take 1 tablet (40 mg total) by mouth daily. Patient taking differently: Take 40 mg by mouth daily before breakfast.  05/15/14  Yes Shawnee Knapp, MD  Polyethyl Glycol-Propyl Glycol (SYSTANE OP) Apply 1 drop to eye 2 (two) times daily.   Yes Historical Provider, MD  pravastatin (PRAVACHOL) 40 MG tablet Take 1 tablet by mouth daily. 07/14/14  Yes Historical Provider, MD  conjugated estrogens (PREMARIN) vaginal cream Place one applicator at bedtime twice a week. Patient taking differently: daily as needed. Place one applicator at bedtime twice a week. 06/06/14   Ezekiel Slocumb, PA-C  Diphenhyd-Hydrocort-Nystatin (FIRST-DUKES MOUTHWASH) SUSP Use as  directed 5 mLs in the mouth or throat every 2 (two) hours as needed (sore throat). 05/15/14   Shawnee Knapp, MD  HYDROcodone-ibuprofen (VICOPROFEN) 7.5-200 MG per tablet Take 1 tablet by mouth every 8 (eight) hours as needed for moderate pain.    Historical Provider, MD  pravastatin (PRAVACHOL) 80 MG tablet Take 40 mg by mouth every evening.    Historical Provider, MD  PRESCRIPTION MEDICATION Ventolin inhaler taking    Historical Provider, MD  rosuvastatin (CRESTOR) 40 MG tablet Take 1 tablet (40 mg total) by mouth daily. Patient not taking: Reported on 07/08/2014 05/22/14   Shawnee Knapp, MD   BP 138/59 mmHg  Pulse 58  Temp(Src) 98.2 F (36.8 C)  Resp 13  Ht 5\' 2"  (1.575 m)  Wt 236 lb (107.049 kg)  BMI 43.15 kg/m2  SpO2 99% Physical Exam  Constitutional: She is oriented to person, place, and time. She appears well-developed and well-nourished. No distress.  HENT:  Head: Normocephalic and atraumatic.  Eyes: EOM are normal.  Neck: Normal range of motion.  Cardiovascular: Normal rate, regular rhythm and normal heart sounds.   Pulmonary/Chest: Effort normal and breath sounds normal.  Abdominal: Soft. She exhibits no distension. There is no tenderness.  Musculoskeletal: Normal range of motion.  Neurological: She is alert and oriented to person, place, and time.  Skin: Skin is warm and dry.  Psychiatric: She has a normal mood and affect. Judgment normal.  Nursing note and vitals reviewed.   ED Course  Procedures (including critical care time) Labs Review Labs Reviewed  CBC WITH DIFFERENTIAL - Abnormal; Notable for the following:    Hemoglobin 10.7 (*)    HCT 34.9 (*)    MCH 25.0 (*)    All other components within normal limits  COMPREHENSIVE METABOLIC PANEL - Abnormal; Notable for the following:    Glucose, Bld 100 (*)    AST 101 (*)    ALT 43 (*)    GFR calc non Af Amer 55 (*)    GFR calc Af Amer 64 (*)    All other components within normal limits  LIPASE, BLOOD  URINALYSIS,  ROUTINE W REFLEX MICROSCOPIC    Imaging Review US Abdomen Complete  07/17/2014   CLINICAL DATA:  Mid abdominal pain.  EXAM: ULTRASOUND ABDOMEN COMPLETE  COMPARISON:  None.  FINDINGS: Gallbladder: 7 mm and 6 mm non shadowing non mobile echogenic echo densities consistent with non shadowing stones or polyps .  Common bile duct: Diameter: 2.9 mm  Liver: The liver is inhomogeneous suggesting possibility of fatty infiltration.  IVC: No abnormality visualized.  Pancreas: Visualized portion unremarkable.  Spleen: Size and appearance within normal limits.  Right Kidney: Length: 9.3 cm. Echogenicity within normal limits. No mass or hydronephrosis visualized.  Left Kidney: Length: 9.1 cm. Echogenicity within normal limits. No mass or hydronephrosis visualized.  Abdominal aorta: No aneurysm visualized.  Other findings: None.  IMPRESSION: 1. Two tiny non shadowing non mobile echo densities within the gallbladder. These are consistent with polyps and/or non shadowing stones. 2. Possible fatty infiltration of the liver.   Electronically Signed   By: Marcello Moores  Register   On: 07/17/2014 09:59   US Transvaginal Non-ob  07/17/2014   CLINICAL DATA:  Rectal bleeding, history of breast cancer, status post hysterectomy  EXAM: TRANSABDOMINAL AND TRANSVAGINAL ULTRASOUND OF PELVIS  TECHNIQUE: Both transabdominal and transvaginal ultrasound examinations of the pelvis were performed. Transabdominal technique was performed for global imaging of the pelvis including uterus, ovaries, adnexal regions, and pelvic cul-de-sac. It was necessary to proceed with endovaginal exam following the transabdominal exam to visualize the bilateral adnexae.  COMPARISON:  None  FINDINGS: Uterus  Surgically absent.  Right ovary  Not discretely visualized.  Left ovary  Measurements: 2.7 x 1.2 x 1.2 cm. Normal appearance/no adnexal mass.  Other findings  No free fluid.  IMPRESSION: Status post hysterectomy.  Left ovary is within normal limits.  Right ovary is  not discretely visualized.   Electronically Signed   By: Julian Hy M.D.   On: 07/17/2014 12:04   US Pelvis Complete  07/17/2014   CLINICAL DATA:  Rectal bleeding, history of breast cancer, status post hysterectomy  EXAM: TRANSABDOMINAL AND TRANSVAGINAL ULTRASOUND OF PELVIS  TECHNIQUE: Both transabdominal and transvaginal ultrasound examinations of the pelvis were performed. Transabdominal technique was performed for global imaging of the pelvis including uterus, ovaries, adnexal regions, and pelvic cul-de-sac. It was necessary to proceed with endovaginal exam following the transabdominal exam to visualize the bilateral adnexae.  COMPARISON:  None  FINDINGS: Uterus  Surgically absent.  Right ovary  Not discretely visualized.  Left ovary  Measurements: 2.7 x 1.2 x 1.2 cm. Normal appearance/no adnexal mass.  Other findings  No free fluid.  IMPRESSION: Status post hysterectomy.  Left ovary is within normal limits.  Right ovary is not discretely visualized.   Electronically Signed   By: Julian Hy M.D.   On: 07/17/2014 12:04   US Abdomen Limited  07/18/2014   CLINICAL DATA:  Acute onset of upper abdominal pain, with elevated LFTs. Subsequent encounter.  EXAM: US ABDOMEN LIMITED - RIGHT UPPER QUADRANT  COMPARISON:  Abdominal ultrasound performed 07/17/2014  FINDINGS: Gallbladder:  Two small nonshadowing nonmobile echogenic foci are again noted within the gallbladder, measuring up to 7 mm in size, reflecting either stones or polyps. The gallbladder is otherwise unremarkable in appearance. No significant gallbladder wall thickening or pericholecystic fluid is seen. No ultrasonographic Murphy's sign is elicited.  Common bile duct:  Diameter: 0.7 cm, within normal limits for the patient's age.  Liver:  No focal lesion identified. Within normal limits in parenchymal echogenicity.  IMPRESSION: 1. No acute abnormality seen at the right upper quadrant. 2. Small stones or polyps again noted within the  gallbladder. Gallbladder otherwise unremarkable in appearance.   Electronically Signed   By: Garald Balding M.D.   On: 07/18/2014 06:40  I personally reviewed the imaging tests through PACS system I reviewed available ER/hospitalization records through the EMR    EKG Interpretation None      MDM   Final diagnoses:  Upper abdominal pain  Elevated liver function tests  Very mild elevation in LFTs.  Repeat limited ultrasound performed demonstrating no significant abnormality from prior EKG in regards to her gallbladder.  Common bile duct is normal in size.  Outpatient GI in PCP follow-up.  She'll need to have her liver function tests repeated.  She understands to return to the ER for new or worsening symptoms.    Hoy Morn, MD 07/18/14 [phone]

## 2014-07-18 NOTE — Discharge Instructions (Signed)

## 2014-07-23 ENCOUNTER — Other Ambulatory Visit: Payer: Self-pay | Admitting: Gastroenterology

## 2014-07-26 ENCOUNTER — Encounter (HOSPITAL_COMMUNITY): Admission: RE | Payer: Self-pay | Source: Ambulatory Visit

## 2014-07-26 ENCOUNTER — Other Ambulatory Visit: Payer: Self-pay | Admitting: Gastroenterology

## 2014-07-26 ENCOUNTER — Inpatient Hospital Stay (HOSPITAL_COMMUNITY): Admission: RE | Admit: 2014-07-26 | Payer: Medicare Other | Source: Ambulatory Visit | Admitting: Orthopedic Surgery

## 2014-07-26 DIAGNOSIS — R1084 Generalized abdominal pain: Secondary | ICD-10-CM

## 2014-07-26 DIAGNOSIS — R945 Abnormal results of liver function studies: Principal | ICD-10-CM

## 2014-07-26 DIAGNOSIS — R7989 Other specified abnormal findings of blood chemistry: Secondary | ICD-10-CM

## 2014-07-26 SURGERY — ARTHROPLASTY, KNEE, TOTAL
Anesthesia: General | Laterality: Left

## 2014-08-02 ENCOUNTER — Ambulatory Visit
Admission: RE | Admit: 2014-08-02 | Discharge: 2014-08-02 | Disposition: A | Discharge status: Home / Self Care | Payer: Medicare Other | Source: Ambulatory Visit | Attending: Gastroenterology | Admitting: Gastroenterology

## 2014-08-02 DIAGNOSIS — R1084 Generalized abdominal pain: Secondary | ICD-10-CM

## 2014-08-02 DIAGNOSIS — R7989 Other specified abnormal findings of blood chemistry: Secondary | ICD-10-CM

## 2014-08-02 DIAGNOSIS — R945 Abnormal results of liver function studies: Principal | ICD-10-CM

## 2014-08-02 MED ORDER — IOHEXOL 300 MG/ML  SOLN
125.0000 mL | Freq: Once | INTRAMUSCULAR | Status: AC | PRN
  Administered 2014-08-02: 125 mL via INTRAVENOUS

## 2014-08-22 DIAGNOSIS — M1712 Unilateral primary osteoarthritis, left knee: Secondary | ICD-10-CM | POA: Diagnosis not present

## 2014-08-26 ENCOUNTER — Encounter: Payer: Self-pay | Admitting: Emergency Medicine

## 2014-09-19 ENCOUNTER — Ambulatory Visit (INDEPENDENT_AMBULATORY_CARE_PROVIDER_SITE_OTHER): Payer: Medicare Other | Admitting: Family Medicine

## 2014-09-19 ENCOUNTER — Ambulatory Visit (INDEPENDENT_AMBULATORY_CARE_PROVIDER_SITE_OTHER): Payer: Medicare Other

## 2014-09-19 VITALS — BP 146/80 | HR 65 | Temp 97.6°F | Resp 16 | Ht 61.75 in | Wt 236.6 lb

## 2014-09-19 DIAGNOSIS — R05 Cough: Secondary | ICD-10-CM

## 2014-09-19 DIAGNOSIS — I10 Essential (primary) hypertension: Secondary | ICD-10-CM

## 2014-09-19 DIAGNOSIS — D6489 Other specified anemias: Secondary | ICD-10-CM

## 2014-09-19 DIAGNOSIS — R002 Palpitations: Secondary | ICD-10-CM

## 2014-09-19 DIAGNOSIS — Z634 Disappearance and death of family member: Secondary | ICD-10-CM

## 2014-09-19 DIAGNOSIS — R059 Cough, unspecified: Secondary | ICD-10-CM

## 2014-09-19 DIAGNOSIS — G4733 Obstructive sleep apnea (adult) (pediatric): Secondary | ICD-10-CM

## 2014-09-19 LAB — POCT CBC
Granulocyte percent: 58.4 %G (ref 37–80)
HCT, POC: 35 % — AB (ref 37.7–47.9)
Hemoglobin: 10.6 g/dL — AB (ref 12.2–16.2)
Lymph, poc: 2 (ref 0.6–3.4)
MCH, POC: 25.1 pg — AB (ref 27–31.2)
MCHC: 30.4 g/dL — AB (ref 31.8–35.4)
MCV: 82.5 fL (ref 80–97)
MID (cbc): 0.4 (ref 0–0.9)
MPV: 5.7 fL (ref 0–99.8)
POC Granulocyte: 3.4 (ref 2–6.9)
POC LYMPH PERCENT: 34.6 %L (ref 10–50)
POC MID %: 7 %M (ref 0–12)
Platelet Count, POC: 356 10*3/uL (ref 142–424)
RBC: 4.24 M/uL (ref 4.04–5.48)
RDW, POC: 16 %
WBC: 5.9 10*3/uL (ref 4.6–10.2)

## 2014-09-19 LAB — COMPLETE METABOLIC PANEL WITH GFR
ALT: 10 U/L (ref 0–35)
AST: 13 U/L (ref 0–37)
Albumin: 3.6 g/dL (ref 3.5–5.2)
Alkaline Phosphatase: 77 U/L (ref 39–117)
BUN: 19 mg/dL (ref 6–23)
CO2: 28 mEq/L (ref 19–32)
Calcium: 9.3 mg/dL (ref 8.4–10.5)
Chloride: 105 mEq/L (ref 96–112)
Creat: 1.22 mg/dL — ABNORMAL HIGH (ref 0.50–1.10)
GFR, Est African American: 53 mL/min — ABNORMAL LOW
GFR, Est Non African American: 46 mL/min — ABNORMAL LOW
Glucose, Bld: 84 mg/dL (ref 70–99)
Potassium: 4.2 mEq/L (ref 3.5–5.3)
Sodium: 140 mEq/L (ref 135–145)
Total Bilirubin: 0.8 mg/dL (ref 0.2–1.2)
Total Protein: 6.6 g/dL (ref 6.0–8.3)

## 2014-09-19 LAB — GLUCOSE, POCT (MANUAL RESULT ENTRY): POC Glucose: 85 mg/dl (ref 70–99)

## 2014-09-19 LAB — TSH: TSH: 1.243 u[IU]/mL (ref 0.350–4.500)

## 2014-09-19 MED ORDER — CLONAZEPAM 0.5 MG PO TABS: ORAL_TABLET | ORAL | Status: DC

## 2014-09-19 MED ORDER — ALBUTEROL SULFATE (2.5 MG/3ML) 0.083% IN NEBU: 2.5000 mg | INHALATION_SOLUTION | Freq: Four times a day (QID) | RESPIRATORY_TRACT | Status: DC | PRN

## 2014-09-19 MED ORDER — BENZONATATE 100 MG PO CAPS: 200.0000 mg | ORAL_CAPSULE | Freq: Two times a day (BID) | ORAL | Status: DC | PRN

## 2014-09-19 NOTE — Progress Notes (Signed)
Chief Complaint:  Chief Complaint  Patient presents with  . Palpitations    X2-3 days  . Cough    X2-3 days  . Blood sugar check    HPI: Alyssa Jefferson is a 69 y.o. female who is here for  1 week hisotry of palpitations without any CP or SOB, mostly notices it at rest when she is in bed, she does not notice if better with exertion or at rest, no difference. She doe snot have nay othewr sxs. Has had this before in the past, had to wear a heart monitor for 1 week, they did not find anything concerning. She also recently lost a sister and is anxious.  She has a hx of HTN, thryoid disease. She  Also has a cough but has not taken anything for it. The cough started 3 days ago and has thick yellow dc.  She wants her blood sugars checked.   BP Readings from Last 3 Encounters:  09/19/14 146/80  07/18/14 138/59  06/06/14 132/62   Wt Readings from Last 3 Encounters:  09/19/14 236 lb 9.6 oz (107.321 kg)  07/18/14 236 lb (107.049 kg)  06/06/14 237 lb 3.2 oz (107.593 kg)   No results found for: HGBA1C Lab Results  Component Value Date   LDLCALC 133* 05/15/2014   CREATININE 1.03 07/18/2014     Past Medical History  Diagnosis Date  . High cholesterol   . Hypertension   . Depression   . Cancer 2004    breast  . Glaucoma   . Thyroid disease     thyroid removed  . OSA (obstructive sleep apnea)     tried CPAP- but unsuccessful  . Shortness of breath dyspnea   . Hypothyroidism   . GERD (gastroesophageal reflux disease)   . Arthritis     knees, back   Past Surgical History  Procedure Laterality Date  . Breast surgery  2004    left breast removed due to cancer no chemo or radiation  . Abdominal hysterectomy  83    BSO  . Thyroid removed      due to knot  . Knee arthroscopy Left    History   Social History  . Marital Status: Widowed    Spouse Name: N/A    Number of Children: N/A  . Years of Education: N/A   Occupational History  . retired    Social History  Main Topics  . Smoking status: Former Smoker -- 20 years    Types: Cigarettes    Quit date: 08/16/1978  . Smokeless tobacco: Never Used     Comment: 1 pack per week--10/16/12  . Alcohol Use: No  . Drug Use: No  . Sexual Activity: No   Other Topics Concern  . None   Social History Narrative   Family History  Problem Relation Age of Onset  . Lung cancer Father   . Breast cancer Sister   . Ovarian cancer Sister    Allergies  Allergen Reactions  . Codeine Other (See Comments)    confusion  . Lipitor [Atorvastatin Calcium] Itching   Prior to Admission medications   Medication Sig Start Date End Date Taking? Authorizing Provider  Azelastine HCl 0.15 % SOLN Place 2 sprays into the nose 2 (two) times daily. 05/15/14  Yes Shawnee Knapp, MD  busPIRone (BUSPAR) 7.5 MG tablet Take 1 tablet (7.5 mg total) by mouth 2 (two) times daily. 05/15/14  Yes Shawnee Knapp, MD  conjugated  estrogens (PREMARIN) vaginal cream Place one applicator at bedtime twice a week. Patient taking differently: daily as needed. Place one applicator at bedtime twice a week. 06/06/14  Yes Bennett Scrape V, PA-C  fluticasone (FLONASE) 50 MCG/ACT nasal spray Place 2 sprays into both nostrils daily. Patient taking differently: Place 2 sprays into both nostrils daily as needed for allergies or rhinitis.  05/15/14  Yes Shawnee Knapp, MD  HYDROcodone-ibuprofen (VICOPROFEN) 7.5-200 MG per tablet Take 1 tablet by mouth every 8 (eight) hours as needed for moderate pain.   Yes Historical Provider, MD  latanoprost (XALATAN) 0.005 % ophthalmic solution Place 1 drop into the right eye at bedtime.    Yes Historical Provider, MD  levothyroxine (SYNTHROID) 100 MCG tablet Take 1 tablet (100 mcg total) by mouth daily before breakfast. 10/24/13  Yes Roselee Culver, MD  losartan (COZAAR) 100 MG tablet Take 1 tablet (100 mg total) by mouth daily. PATIENT NEEDS OFFICE VISIT FOR ADDITIONAL REFILLS Patient taking differently: Take 100 mg by mouth daily  before breakfast. PATIENT NEEDS OFFICE VISIT FOR ADDITIONAL REFILLS 10/23/13  Yes Roselee Culver, MD  pantoprazole (PROTONIX) 40 MG tablet Take 1 tablet (40 mg total) by mouth daily. Patient taking differently: Take 40 mg by mouth daily before breakfast.  05/15/14  Yes Shawnee Knapp, MD  pravastatin (PRAVACHOL) 40 MG tablet Take 1 tablet by mouth daily. 07/14/14  Yes Historical Provider, MD  PRESCRIPTION MEDICATION Ventolin inhaler taking   Yes Historical Provider, MD     ROS: The patient denies fevers, chills, night sweats, unintentional weight loss, chest pain, wheezing, dyspnea on exertion, nausea, vomiting, abdominal pain, dysuria, hematuria, melena, numbness, weakness, or tingling.   All other systems have been reviewed and were otherwise negative with the exception of those mentioned in the HPI and as above.    PHYSICAL EXAM: Filed Vitals:   09/19/14 1024  BP: 146/80  Pulse: 65  Temp: 97.6 F (36.4 C)  Resp: 16   Filed Vitals:   09/19/14 1024  Height: 5' 1.75" (1.568 m)  Weight: 236 lb 9.6 oz (107.321 kg)   Body mass index is 43.65 kg/(m^2).  General: Alert, no acute distress HEENT:  Normocephalic, atraumatic, oropharynx patent. EOMI, PERRLA Cardiovascular:  Regular rate and rhythm, no rubs murmurs or gallops.  No Carotid bruits, radial pulse intact. No pedal edema.  Respiratory: Clear to auscultation bilaterally.  No wheezes, rales, or rhonchi.  No cyanosis, no use of accessory musculature GI: No organomegaly, abdomen is soft and non-tender, positive bowel sounds.  No masses. Skin: No rashes. Neurologic: Facial musculature symmetric. Psychiatric: Patient is appropriate throughout our interaction. Lymphatic: No cervical lymphadenopathy Musculoskeletal: Gait intact.   LABS: Results for orders placed or performed in visit on 09/19/14  POCT CBC  Result Value Ref Range   WBC 5.9 4.6 - 10.2 K/uL   Lymph, poc 2.0 0.6 - 3.4   POC LYMPH PERCENT 34.6 10 - 50 %L   MID (cbc)  0.4 0 - 0.9   POC MID % 7.0 0 - 12 %M   POC Granulocyte 3.4 2 - 6.9   Granulocyte percent 58.4 37 - 80 %G   RBC 4.24 4.04 - 5.48 M/uL   Hemoglobin 10.6 (A) 12.2 - 16.2 g/dL   HCT, POC 35.0 (A) 37.7 - 47.9 %   MCV 82.5 80 - 97 fL   MCH, POC 25.1 (A) 27 - 31.2 pg   MCHC 30.4 (A) 31.8 - 35.4 g/dL   RDW, POC  16.0 %   Platelet Count, POC 356 142 - 424 K/uL   MPV 5.7 0 - 99.8 fL  POCT glucose (manual entry)  Result Value Ref Range   POC Glucose 85 70 - 99 mg/dl     EKG/XRAY:   EKG is abnormal , it doe show some early beats Primary read interpreted by Dr. Marin Comment at [facility]. No infiltrates/effusion No effusion   ASSESSMENT/PLAN: Encounter Diagnoses  Name Primary?  . Palpitations Yes  . Cough   . Essential hypertension   . OSA (obstructive sleep apnea)   . Anemia due to other cause     69 y/o AA female with PMH HTN, OSA non compliant with mask ( advise to see if she can drop it by where she got it to get nasal prongs or another device), hyperlipidemia, thyroid disease, breast cancer, anxiety/depression who is here for intermittent palpitations for the last 1 week. She has had this n the past and was worked up, does not remember much about outcome. She also recently lost a sister. EKG shows a premature beat Refer to cardiology Rx Klonopin for anxiety/stress during this bereavement period F/u prn   Gross sideeffects, risk and benefits, and alternatives of medications d/w patient. Patient is aware that all medications have potential sideeffects and we are unable to predict every sideeffect or drug-drug interaction that may occur.  LE, [place], DO 09/19/2014 12:01 PM

## 2014-09-25 ENCOUNTER — Other Ambulatory Visit: Payer: Self-pay | Admitting: Family Medicine

## 2014-09-25 NOTE — Telephone Encounter (Signed)
Dr Marin Comment, you just saw pt for check up, but don't see this med discussed. RFs OK?

## 2014-10-14 ENCOUNTER — Encounter: Payer: Self-pay | Admitting: *Deleted

## 2014-10-15 ENCOUNTER — Other Ambulatory Visit: Payer: Self-pay | Admitting: Emergency Medicine

## 2014-10-17 ENCOUNTER — Encounter: Payer: Self-pay | Admitting: Cardiovascular Disease

## 2014-10-17 ENCOUNTER — Ambulatory Visit (INDEPENDENT_AMBULATORY_CARE_PROVIDER_SITE_OTHER): Payer: Medicare Other | Admitting: Cardiovascular Disease

## 2014-10-17 VITALS — BP 140/78 | HR 68 | Ht 61.75 in | Wt 237.0 lb

## 2014-10-17 DIAGNOSIS — I493 Ventricular premature depolarization: Secondary | ICD-10-CM | POA: Diagnosis not present

## 2014-10-17 DIAGNOSIS — G4733 Obstructive sleep apnea (adult) (pediatric): Secondary | ICD-10-CM | POA: Diagnosis not present

## 2014-10-17 DIAGNOSIS — R002 Palpitations: Secondary | ICD-10-CM

## 2014-10-17 DIAGNOSIS — E7800 Pure hypercholesterolemia, unspecified: Secondary | ICD-10-CM

## 2014-10-17 NOTE — Progress Notes (Signed)
Cardiology Office Note   Date:  10/17/2014   ID:  DAKODA BASSETTE, DOB 31-Oct-1945, MRN [id number]  PCP:  Jenny Reichmann, MD  Cardiologist:   Thayer Headings, MD   Chief Complaint  Patient presents with  . Palpitations   Problem List 1. Palpitations 2. Essential hypertension 3. Hyperlipidemia 4. Depression  5. Hypothyroidism 6. Obstructive sleep apnea    History of Present Illness: NYEEMA WANT is a 69 y.o. female who presents for evaluation of palpitations.  The palpitations have been present for the past 2-3 months.    They are described as very quick HR irregularities.  No CP or dyspnea.  No dizziness. No syncope.  Not associated with eating or drinking. Worse at night when she lies down in bed.     Past Medical History  Diagnosis Date  . High cholesterol   . Hypertension   . Depression   . Cancer 2004    breast  . Glaucoma   . Thyroid disease     thyroid removed  . OSA (obstructive sleep apnea)     tried CPAP- but unsuccessful  . Shortness of breath dyspnea   . Hypothyroidism   . GERD (gastroesophageal reflux disease)   . Arthritis     knees, back    Past Surgical History  Procedure Laterality Date  . Breast surgery  2004    left breast removed due to cancer no chemo or radiation  . Abdominal hysterectomy  82    BSO  . Thyroid removed      due to knot  . Knee arthroscopy Left      Current Outpatient Prescriptions  Medication Sig Dispense Refill  . albuterol (PROVENTIL) (2.5 MG/3ML) 0.083% nebulizer solution Take 3 mLs (2.5 mg total) by nebulization every 6 (six) hours as needed for wheezing or shortness of breath. 150 mL 1  . Azelastine HCl 0.15 % SOLN Place 2 sprays into the nose 2 (two) times daily. 30 mL 1  . benzonatate (TESSALON) 100 MG capsule Take 2 capsules (200 mg total) by mouth 2 (two) times daily as needed. 30 capsule 1  . conjugated estrogens (PREMARIN) vaginal cream Place one applicator at bedtime twice a week. (Patient taking  differently: daily as needed. Place one applicator at bedtime twice a week.) 42.5 g 3  . latanoprost (XALATAN) 0.005 % ophthalmic solution Place 1 drop into the right eye at bedtime.     Marland Kitchen levothyroxine (SYNTHROID) 100 MCG tablet Take 1 tablet (100 mcg total) by mouth daily before breakfast. 30 tablet 11  . losartan (COZAAR) 100 MG tablet take 1 tablet by mouth once daily 90 tablet 1  . pravastatin (PRAVACHOL) 40 MG tablet Take 1 tablet by mouth daily.  0  . PRESCRIPTION MEDICATION Ventolin inhaler taking     No current facility-administered medications for this visit.    Allergies:   Codeine and Lipitor    Social History:  The patient  reports that she quit smoking about 36 years ago. Her smoking use included Cigarettes. She quit after 20 years of use. She has never used smokeless tobacco. She reports that she does not drink alcohol or use illicit drugs.   Family History:  The patient's family history includes Breast cancer in her sister; Lung cancer in her father; Ovarian cancer in her sister.    ROS:  Please see the history of present illness.    Review of Systems: Constitutional:  denies fever, chills, diaphoresis, appetite change and fatigue.  HEENT: denies photophobia, eye pain, redness, hearing loss, ear pain, congestion, sore throat, rhinorrhea, sneezing, neck pain, neck stiffness and tinnitus.  Respiratory: denies SOB, DOE, cough, chest tightness, and wheezing.  Cardiovascular: denies chest pain, palpitations and leg swelling.  Gastrointestinal: denies nausea, vomiting, abdominal pain, diarrhea, constipation, blood in stool.  Genitourinary: denies dysuria, urgency, frequency, hematuria, flank pain and difficulty urinating.  Musculoskeletal: denies  myalgias, back pain, joint swelling, arthralgias and gait problem.   Skin: denies pallor, rash and wound.  Neurological: denies dizziness, seizures, syncope, weakness, light-headedness, numbness and headaches.   Hematological:  denies adenopathy, easy bruising, personal or family bleeding history.  Psychiatric/ Behavioral: denies suicidal ideation, mood changes, confusion, nervousness, sleep disturbance and agitation.       All other systems are reviewed and negative.    PHYSICAL EXAM: VS:  BP 140/78 mmHg  Pulse 68  Ht 5' 1.75" (1.568 m)  Wt 237 lb (107.502 kg)  BMI 43.72 kg/m2 , BMI Body mass index is 43.72 kg/(m^2). GEN: Well nourished, well developed, in no acute distress HEENT: normal Neck: no JVD, carotid bruits, or masses Cardiac: RRR; no murmurs, rubs, or gallops,no edema  Respiratory:  clear to auscultation bilaterally, normal work of breathing GI: soft, nontender, nondistended, + BS MS: no deformity or atrophy Skin: warm and dry, no rash Neuro:  Strength and sensation are intact Psych: normal   EKG:  EKG is ordered today. The ekg ordered today demonstrates NSR at 68, NS T wave abn.    Recent Labs: 07/18/2014: Platelets 284 09/19/2014: ALT 10; BUN 19; Creatinine 1.22*; Hemoglobin 10.6*; Potassium 4.2; Sodium 140; TSH 1.243    Lipid Panel    Component Value Date/Time   CHOL 215* 05/15/2014 1415   TRIG 81 05/15/2014 1415   HDL 66 05/15/2014 1415   CHOLHDL 3.3 05/15/2014 1415   VLDL 16 05/15/2014 1415   LDLCALC 133* 05/15/2014 1415      Wt Readings from Last 3 Encounters:  10/17/14 237 lb (107.502 kg)  09/19/14 236 lb 9.6 oz (107.321 kg)  07/18/14 236 lb (107.049 kg)      Other studies Reviewed: Additional studies/ records that were reviewed today include: . Review of the above records demonstrates:    ASSESSMENT AND PLAN:  1.  Palpitations: The patient presents with palpitations that clinically sound like premature ventricular contractions. These occur predominantly at night. They're not associated with chest pain, shortness breath, syncope, or presyncope.  I do not think that she has any specific underlying cardiac etiology.  We discussed fact that she needs to wear CPAP  mask every night. She see her blood pressure under control. She needs to watch her diet and limit her caffeine intake. She needs to ensure that her thyroid is adequately but not overly replaced.  At this point I do not think that she needs any additional workup. She'll see me on an as-needed basis.  2. Essential hypertension: Stable, followed by her general medical doctor.  3. Hyperlipidemia- continue current medications  4. Obstructive sleep apnea. She does not wear her CPAP mask every night. I've encouraged her to wear CPAP mask on a regular basis.   Current medicines are reviewed at length with the patient today.  The patient does not have concerns regarding medicines.  The following changes have been made:  no change   Disposition:   FU with me as needed.     Signed, Nahser, Wonda Cheng, MD  10/17/2014 2:01 PM    [facility]  [address], [place], [place]  [address] Phone: [phone]; Fax: [phone]

## 2014-10-17 NOTE — Patient Instructions (Signed)
Your physician recommends that you continue on your current medications as directed. Please refer to the Current Medication list given to you today.  Your physician recommends that you schedule a follow-up appointment in: as needed with Dr. Nahser  

## 2014-10-31 ENCOUNTER — Encounter: Payer: Self-pay | Admitting: Emergency Medicine

## 2014-10-31 ENCOUNTER — Ambulatory Visit (INDEPENDENT_AMBULATORY_CARE_PROVIDER_SITE_OTHER): Payer: Medicare Other | Admitting: Emergency Medicine

## 2014-10-31 VITALS — BP 184/66 | HR 66 | Temp 98.2°F | Resp 16 | Ht 62.0 in | Wt 235.8 lb

## 2014-10-31 DIAGNOSIS — I10 Essential (primary) hypertension: Secondary | ICD-10-CM | POA: Diagnosis not present

## 2014-10-31 DIAGNOSIS — I493 Ventricular premature depolarization: Secondary | ICD-10-CM

## 2014-10-31 DIAGNOSIS — F4321 Adjustment disorder with depressed mood: Secondary | ICD-10-CM | POA: Diagnosis not present

## 2014-10-31 MED ORDER — AMLODIPINE BESYLATE 2.5 MG PO TABS: 2.5000 mg | ORAL_TABLET | Freq: Every day | ORAL | Status: DC

## 2014-10-31 MED ORDER — SERTRALINE HCL 50 MG PO TABS: ORAL_TABLET | ORAL | Status: DC

## 2014-10-31 MED ORDER — LOSARTAN POTASSIUM 100 MG PO TABS: 100.0000 mg | ORAL_TABLET | Freq: Every day | ORAL | Status: DC

## 2014-10-31 MED ORDER — FLUOXETINE HCL 10 MG PO TABS: 10.0000 mg | ORAL_TABLET | Freq: Every day | ORAL | Status: DC

## 2014-10-31 MED ORDER — HYDROCHLOROTHIAZIDE 12.5 MG PO CAPS: 12.5000 mg | ORAL_CAPSULE | Freq: Every day | ORAL | Status: DC

## 2014-10-31 NOTE — Progress Notes (Addendum)
Subjective:  This chart was scribed for Alyssa Russian, MD by [name], at [facility].  This patient was seen in room 29 and the patient's care was started at 10:40 AM.     Patient ID: Alyssa Jefferson, female    DOB: October 08, 1945, 69 y.o.   MRN: [id number]  HPI  HPI Comments: Alyssa Jefferson is a 69 y.o. female who presents to [facility] for medication refill.  Patient notes of having heart palpitations but was not recommended to use medication by her cardiologist.  She was told to continue using her CPAP machine.  Patient notes that she is having trouble with her new CPAP machine and states that she cant get used to it.  Patient notes her blood pressure has been around 160-170 and bottom number around 70-80 recently. Patient has not had her blood pressure medication yet.   Right arm: 180/70 **Patient notes her blood pressure has never been this high.   Depression: She states that her medication is not working well with her stress levels due to her sisters death in September 18, 2014.   Patient states that she took Prozac and Zoloft years ago but stopped working after a while and would like medication that will help relieve the emotional pain of losing loved ones. Patient notes that she does not have any more members in her family left and does not get along well with her daughter.  Patient has an appointment to speak to Hospice.     Patient Active Problem List   Diagnosis Date Noted  . Palpitations 10/17/2014  . OSA (obstructive sleep apnea) 10/16/2012  . BMI 40.0-44.9, adult 12/20/2011  . Hypothyroid 12/20/2011  . 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 Osteoarthritis, knee 12/20/2011  . Other and unspecified hyperlipidemia 12/20/2011  . Chronic depression 12/20/2011  . Uncontrolled hypertension as indication for native nephrectomy 12/20/2011  . Anemia 12/20/2011  . High cholesterol   . Cancer    Past Medical  History  Diagnosis Date  . High cholesterol   . Hypertension   . Depression   . Cancer 2004    breast  . Glaucoma   . Thyroid disease     thyroid removed  . OSA (obstructive sleep apnea)     tried CPAP- but unsuccessful  . Shortness of breath dyspnea   . Hypothyroidism   . GERD (gastroesophageal reflux disease)   . Arthritis     knees, back   Past Surgical History  Procedure Laterality Date  . Breast surgery  2004    left breast removed due to cancer no chemo or radiation  . Abdominal hysterectomy  82    BSO  . Thyroid removed      due to knot  . Knee arthroscopy Left    Allergies  Allergen Reactions  . Codeine Other (See Comments)    confusion  . Lipitor [Atorvastatin Calcium] Itching   Prior to Admission medications   Medication Sig Start Date End Date Taking? Authorizing Provider  albuterol (PROVENTIL) (2.5 MG/3ML) 0.083% nebulizer solution Take 3 mLs (2.5 mg total) by nebulization every 6 (six) hours as needed for wheezing or shortness of breath. 09/19/14   Thao P Le, DO  Azelastine HCl 0.15 % SOLN Place 2 sprays into the nose 2 (two) times daily. 05/15/14   Shawnee Knapp, MD  benzonatate (TESSALON) 100 MG capsule Take 2 capsules (200 mg total) by mouth 2 (two) times  daily as needed. 09/19/14   Thao P Le, DO  conjugated estrogens (PREMARIN) vaginal cream Place one applicator at bedtime twice a week. Patient taking differently: daily as needed. Place one applicator at bedtime twice a week. 06/06/14   Ezekiel Slocumb, PA-C  latanoprost (XALATAN) 0.005 % ophthalmic solution Place 1 drop into the right eye at bedtime.     Historical Provider, MD  levothyroxine (SYNTHROID) 100 MCG tablet Take 1 tablet (100 mcg total) by mouth daily before breakfast. 10/24/13   Roselee Culver, MD  losartan (COZAAR) 100 MG tablet take 1 tablet by mouth once daily 10/16/14   Thao P Le, DO  pravastatin (PRAVACHOL) 40 MG tablet Take 1 tablet by mouth daily. 07/14/14   Historical Provider, MD    PRESCRIPTION MEDICATION Ventolin inhaler taking    Historical Provider, MD   History   Social History  . Marital Status: Widowed    Spouse Name: N/A  . Number of Children: N/A  . Years of Education: N/A   Occupational History  . retired    Social History Main Topics  . Smoking status: Former Smoker -- 20 years    Types: Cigarettes    Quit date: 08/16/1978  . Smokeless tobacco: Never Used     Comment: 1 pack per week--10/16/12  . Alcohol Use: No  . Drug Use: No  . Sexual Activity: No   Other Topics Concern  . Not on file   Social History Narrative    Review of Systems  Constitutional: Negative for fever and chills.  Cardiovascular: Positive for palpitations.       Objective:   Physical Exam  CONSTITUTIONAL: She is a tearful female talking about loss of multiple family members HEAD: Normocephalic/atraumatic EYES: EOMI/PERRL ENMT: Mucous membranes moist NECK: supple no meningeal signs SPINE/BACK:entire spine nontender CV: irregular rhythm no murmurs LUNGS: Lungs are clear to auscultation bilaterally, no apparent distress ABDOMEN: soft, nontender, no rebound or guarding, bowel sounds noted throughout abdomen GU:no cva tenderness NEURO: Pt is awake/alert/appropriate, moves all extremitiesx4.  No facial droop.   EXTREMITIES: pulses normal/equal, full ROM SKIN: warm, color normal PSYCH: no abnormalities of mood noted, alert and oriented to situation  EKG shows normal sinus rhythm with occasional PACs.  Beck depression score is 14.     Filed Vitals:   10/31/14 1032  BP: 184/66  Pulse: 66  Temp: 98.2 F (36.8 C)  TempSrc: Oral  Resp: 16  Height: 5\' 2"  (1.575 m)  Weight: 235 lb 12.8 oz (106.958 kg)  SpO2: 99%   Meds ordered this encounter  Medications  . meloxicam (MOBIC) 15 MG tablet    Sig: Take 15 mg by mouth as needed for pain.  Marland Kitchen losartan (COZAAR) 100 MG tablet    Sig: Take 1 tablet (100 mg total) by mouth daily.    Dispense:  90 tablet     Refill:  1  . DISCONTD: hydrochlorothiazide (MICROZIDE) 12.5 MG capsule    Sig: Take 1 capsule (12.5 mg total) by mouth daily.    Dispense:  90 capsule    Refill:  3  . DISCONTD: FLUoxetine (PROZAC) 10 MG tablet    Sig: Take 1 tablet (10 mg total) by mouth daily.    Dispense:  30 tablet    Refill:  11  . DISCONTD: FLUoxetine (PROZAC) 10 MG tablet    Sig: Take 1 tablet (10 mg total) by mouth daily.    Dispense:  30 tablet    Refill:  11  .  DISCONTD: amLODipine (NORVASC) 2.5 MG tablet    Sig: Take 1 tablet (2.5 mg total) by mouth daily.    Dispense:  30 tablet    Refill:  11  . amLODipine (NORVASC) 2.5 MG tablet    Sig: Take 1 tablet (2.5 mg total) by mouth daily.    Dispense:  30 tablet    Refill:  11  . DISCONTD: sertraline (ZOLOFT) 50 MG tablet    Sig: Take one half tablet a day for the first 2 weeks then 1 tablet daily    Dispense:  30 tablet    Refill:  11  . DISCONTD: sertraline (ZOLOFT) 50 MG tablet    Sig: Take one half tablet a day for the first 2 weeks then 1 tablet daily    Dispense:  30 tablet    Refill:  11  . sertraline (ZOLOFT) 50 MG tablet    Sig: Take one half tablet a day for the first 2 weeks then 1 tablet daily    Dispense:  30 tablet    Refill:  11        Assessment & Plan:  Patient states she cannot take a diuretic. Patient states she has taken Prozac in the past and it did not help. We'll try Zoloft 50 mg to start at a half tablet a day and increase to 50 mg daily. We'll try amlodipine 2.5 one a day to get better blood pressure control. Her EKG showed only PACs no PVCs. She saw the cardiologist last month. I encouraged her to go through grief counseling with hospice over the loss of multiple family members. She was tearful during the entire exam and I felt like this would be very helpful. I encouraged her to exercise continue going to church and spend time with her neighbors.I personally performed the services described in this documentation, which was  scribed in my presence. The recorded information has been reviewed and is accurate.

## 2014-11-04 ENCOUNTER — Ambulatory Visit (INDEPENDENT_AMBULATORY_CARE_PROVIDER_SITE_OTHER): Payer: Medicare Other | Admitting: Emergency Medicine

## 2014-11-04 ENCOUNTER — Telehealth: Payer: Self-pay | Admitting: *Deleted

## 2014-11-04 VITALS — BP 138/84 | HR 64 | Temp 98.2°F | Resp 16 | Ht 62.5 in | Wt 234.4 lb

## 2014-11-04 DIAGNOSIS — Z634 Disappearance and death of family member: Secondary | ICD-10-CM | POA: Diagnosis not present

## 2014-11-04 DIAGNOSIS — I10 Essential (primary) hypertension: Secondary | ICD-10-CM

## 2014-11-04 DIAGNOSIS — I493 Ventricular premature depolarization: Secondary | ICD-10-CM

## 2014-11-04 NOTE — Progress Notes (Signed)
Subjective:  This chart was scribed for [name], by [name], at [facility].  This patient was seen in room 9 and the patient's care was started at 2:25 PM.    Patient ID: Alyssa Jefferson, female    DOB: [birth year], 69 y.o.   MRN: [id number]  HPI  HPI Comments: Alyssa Jefferson is a 69 y.o. female who presents to [facility] for high blood pressure which she had been keeping record of.  Patient notes that her pressures have been higher recently.  She states that she feels fine even though she has been getting high numbers. Patient has had her left breast removed more than 10 years ago.  Patient states that she has been doing much better with her depression.     Blood pressure right arm: 160/70.      Patient Active Problem List   Diagnosis Date Noted  . Palpitations 10/17/2014  . OSA (obstructive sleep apnea) 10/16/2012  . BMI 40.0-44.9, adult 12/20/2011  . Hypothyroid 12/20/2011  . 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 Osteoarthritis, knee 12/20/2011  . Other and unspecified hyperlipidemia 12/20/2011  . Chronic depression 12/20/2011  . Uncontrolled hypertension as indication for native nephrectomy 12/20/2011  . Anemia 12/20/2011  . High cholesterol   . Cancer    Past Medical History  Diagnosis Date  . High cholesterol   . Hypertension   . Depression   . Cancer 2004    breast  . Glaucoma   . Thyroid disease     thyroid removed  . OSA (obstructive sleep apnea)     tried CPAP- but unsuccessful  . Shortness of breath dyspnea   . Hypothyroidism   . GERD (gastroesophageal reflux disease)   . Arthritis     knees, back   Past Surgical History  Procedure Laterality Date  . Breast surgery  2004    left breast removed due to cancer no chemo or radiation  . Abdominal hysterectomy  82    BSO  . Thyroid removed      due to knot  . Knee arthroscopy Left    Allergies  Allergen Reactions   . Codeine Other (See Comments)    confusion  . Lipitor [Atorvastatin Calcium] Itching   Prior to Admission medications   Medication Sig Start Date End Date Taking? Authorizing Provider  albuterol (PROVENTIL) (2.5 MG/3ML) 0.083% nebulizer solution Take 3 mLs (2.5 mg total) by nebulization every 6 (six) hours as needed for wheezing or shortness of breath. 09/19/14  Yes Thao P Le, DO  amLODipine (NORVASC) 2.5 MG tablet Take 1 tablet (2.5 mg total) by mouth daily. 10/31/14  Yes Darlyne Russian, MD  Azelastine HCl 0.15 % SOLN Place 2 sprays into the nose 2 (two) times daily. 05/15/14  Yes Shawnee Knapp, MD  latanoprost (XALATAN) 0.005 % ophthalmic solution Place 1 drop into the right eye at bedtime.    Yes Historical Provider, MD  levothyroxine (SYNTHROID) 100 MCG tablet Take 1 tablet (100 mcg total) by mouth daily before breakfast. 10/24/13  Yes Roselee Culver, MD  losartan (COZAAR) 100 MG tablet Take 1 tablet (100 mg total) by mouth daily. 10/31/14  Yes Darlyne Russian, MD  meloxicam (MOBIC) 15 MG tablet Take 15 mg by mouth as needed for pain.   Yes Historical Provider, MD  pravastatin (PRAVACHOL) 40 MG tablet Take 1 tablet by mouth daily. 07/14/14  Yes Historical Provider,  MD  PRESCRIPTION MEDICATION Ventolin inhaler taking   Yes Historical Provider, MD  sertraline (ZOLOFT) 50 MG tablet Take one half tablet a day for the first 2 weeks then 1 tablet daily 10/31/14  Yes Darlyne Russian, MD  conjugated estrogens (PREMARIN) vaginal cream Place one applicator at bedtime twice a week. Patient not taking: Reported on 10/31/2014 06/06/14   Ezekiel Slocumb, PA-C   History   Social History  . Marital Status: Widowed    Spouse Name: N/A  . Number of Children: N/A  . Years of Education: N/A   Occupational History  . retired    Social History Main Topics  . Smoking status: Former Smoker -- 20 years    Types: Cigarettes    Quit date: 08/16/1978  . Smokeless tobacco: Never Used     Comment: 1 pack per  week--10/16/12  . Alcohol Use: No  . Drug Use: No  . Sexual Activity: No   Other Topics Concern  . Not on file   Social History Narrative     Review of Systems  Constitutional: Negative for fever and chills.  HENT: Negative for drooling.   Eyes: Negative for discharge.  Respiratory: Negative for cough.   Neurological: Negative for syncope.       Objective:   Physical Exam CONSTITUTIONAL: Well developed/well nourished HEAD: Normocephalic/atraumatic EYES: EOMI/PERRL ENMT: Mucous membranes moist NECK: supple no meningeal signs SPINE/BACK:entire spine nontender CV: S1/S2 noted, no murmurs/rubs/gallops noted LUNGS: Lungs are clear to auscultation bilaterally, no apparent distress ABDOMEN: soft, nontender, no rebound or guarding, bowel sounds noted throughout abdomen GU:no cva tenderness NEURO: Pt is awake/alert/appropriate, moves all extremitiesx4.  No facial droop.   EXTREMITIES: pulses normal/equal, full ROM SKIN: warm, color normal PSYCH: no abnormalities of mood noted, alert and oriented to situation   Filed Vitals:   11/04/14 1308  BP: 138/84  Pulse: 64  Temp: 98.2 F (36.8 C)  TempSrc: Oral  Resp: 16  Height: 5' 2.5" (1.588 m)  Weight: 234 lb 6.4 oz (106.323 kg)  SpO2: 96%       Assessment & Plan:  Repeat blood pressure on arrival was 138/84. My blood pressure was 160/80. She will continue amlodipine 2.5. I will check her again in 2 weeks and make further adjustments at that time. Her mood is somewhat better she is not quite as depressed.I personally performed the services described in this documentation, which was scribed in my presence. The recorded information has been reviewed and is accurate.

## 2014-11-04 NOTE — Telephone Encounter (Signed)
Returned patient's phone call (responding to my letter she received) to either reschedule 5/17 f/u appt for a AWV or to schedule an additional appt for her AWV.  Stated she was "walking out the door now to go to [facility] due to her blood pressure".  Advised patient if she could, when she checked out of the clinic, to have them schedule her AWV.  Patient stated understanding.

## 2014-12-11 DIAGNOSIS — H04123 Dry eye syndrome of bilateral lacrimal glands: Secondary | ICD-10-CM | POA: Diagnosis not present

## 2014-12-11 DIAGNOSIS — H4011X2 Primary open-angle glaucoma, moderate stage: Secondary | ICD-10-CM | POA: Diagnosis not present

## 2014-12-11 DIAGNOSIS — H40012 Open angle with borderline findings, low risk, left eye: Secondary | ICD-10-CM | POA: Diagnosis not present

## 2014-12-11 DIAGNOSIS — H25813 Combined forms of age-related cataract, bilateral: Secondary | ICD-10-CM | POA: Diagnosis not present

## 2014-12-31 ENCOUNTER — Ambulatory Visit (INDEPENDENT_AMBULATORY_CARE_PROVIDER_SITE_OTHER): Payer: Medicare Other | Admitting: Emergency Medicine

## 2014-12-31 ENCOUNTER — Encounter: Payer: Self-pay | Admitting: Emergency Medicine

## 2014-12-31 VITALS — BP 120/70 | HR 64 | Temp 98.4°F | Resp 16 | Ht 62.0 in | Wt 237.2 lb

## 2014-12-31 DIAGNOSIS — R609 Edema, unspecified: Secondary | ICD-10-CM | POA: Diagnosis not present

## 2014-12-31 DIAGNOSIS — Z23 Encounter for immunization: Secondary | ICD-10-CM | POA: Diagnosis not present

## 2014-12-31 DIAGNOSIS — I10 Essential (primary) hypertension: Secondary | ICD-10-CM

## 2014-12-31 DIAGNOSIS — Z634 Disappearance and death of family member: Secondary | ICD-10-CM | POA: Diagnosis not present

## 2014-12-31 DIAGNOSIS — I493 Ventricular premature depolarization: Secondary | ICD-10-CM

## 2014-12-31 MED ORDER — ZOSTER VACCINE LIVE 19400 UNT/0.65ML ~~LOC~~ SOLR: 0.6500 mL | Freq: Once | SUBCUTANEOUS | Status: DC

## 2014-12-31 MED ORDER — LOSARTAN POTASSIUM 100 MG PO TABS: 100.0000 mg | ORAL_TABLET | Freq: Every day | ORAL | Status: DC

## 2014-12-31 MED ORDER — FUROSEMIDE 20 MG PO TABS: ORAL_TABLET | ORAL | Status: DC

## 2014-12-31 NOTE — Progress Notes (Signed)
   Subjective:    Patient ID: Alyssa Jefferson, female    DOB: 1946/01/16, 69 y.o.   MRN: [id number] This chart was scribed for Alyssa Queen, MD by Zola Button, Medical Scribe. This patient was seen in room 21 and the patient's care was started at 1:11 PM.   HPI HPI Comments: Alyssa Jefferson is a 69 y.o. female with a hx of hypertension and osteoarthritis (knee) who presents to the [facility] for a follow-up.  Patient has been having some left ankle swelling as well as swelling around her left knee for the past 2 weeks. She plans on having a left knee replacement done soon. Patient does not have support stockings. She has been on fluid pills in the past (several years ago), but not recently. She denies SOB and chest pain.   Patient has been dealing with grief due to recent loss of multiple family members. She has been doing a bit better mentally.  She has been seeing a cardiologist for PVCs. She states the palpitations have resolved.  Patient states she has had a pneumonia vaccine before, but does not believe she has had the Prevnar vaccine. She does not usually have the flu vaccine.  She has a mild rash around her neck that may be related to necklaces she has been wearing.  Review of Systems  Cardiovascular: Positive for leg swelling. Negative for palpitations.  Musculoskeletal: Positive for joint swelling.  Skin: Positive for rash.  Psychiatric/Behavioral: Positive for dysphoric mood.       Objective:   Physical Exam CONSTITUTIONAL: Well developed/well nourished HEAD: Normocephalic/atraumatic EYES: EOM/PERRL ENMT: Mucous membranes moist NECK: supple no meningeal signs SPINE: entire spine nontender CV: S1/S2 noted, no murmurs/rubs/gallops noted LUNGS: Lungs are clear to auscultation bilaterally, no apparent distress ABDOMEN: soft, nontender, no rebound or guarding GU: no cva tenderness NEURO: Pt is awake/alert, moves all extremitiesx4 EXTREMITIES: Severe  degenerative changes of both knees. Puffiness to the soft tissue medially just below the medial joint space of the left knee. There is trace edema of the left ankle. No calf tenderness. SKIN: warm, color normal. Fine, hyperpigmented, maculopapular rash around her neck. PSYCH: no abnormalities of mood noted     Assessment & Plan:  1. Essential hypertension  - losartan (COZAAR) 100 MG tablet; Take 1 tablet (100 mg total) by mouth daily.  Dispense: 90 tablet; Refill: 3 I added Lasix 20 mg to take twice a week. 2. Immunization due  - Pneumococcal conjugate vaccine 13-valent IM  3. PVC (premature ventricular contraction) These have been stable and patient had no PVCs on cardiac exam  4. Bereavement She is doing better with her family losses. She's has not been as depressed recently.  5. Edema Call of Lasix 20 mg 2 times a week   I personally performed the services described in this documentation, which was scribed in my presence. The recorded information has been reviewed and is accurate.  Alyssa Queen, MD  [facility] [place] [facility]  12/31/2014 1:35 PM

## 2015-01-08 ENCOUNTER — Telehealth: Payer: Self-pay

## 2015-01-08 NOTE — Telephone Encounter (Signed)
Pt states she is having a time getting her SYNTHROID 100 MCG, refilled and is feeling sick because of it. Please call [place]

## 2015-01-09 MED ORDER — LEVOTHYROXINE SODIUM 100 MCG PO TABS: 100.0000 ug | ORAL_TABLET | Freq: Every day | ORAL | Status: DC

## 2015-01-09 NOTE — Telephone Encounter (Signed)
Rx sent 

## 2015-01-21 ENCOUNTER — Telehealth: Payer: Self-pay

## 2015-01-21 NOTE — Telephone Encounter (Signed)
Tell patient to increase the dosage to 1-1/2 tablets a day for the next week and then 2 tablets daily and then see me in the office in about 2-3 weeks

## 2015-01-21 NOTE — Telephone Encounter (Signed)
Patient is calling because she was recently prescribed sertraline. Patient needs advising because the medication isn't working. She states that she still wants to argue and fuss with people and it isn't normal for her to behave that way. Please call! [phone]

## 2015-01-21 NOTE — Telephone Encounter (Signed)
Spoke with pt. She has been taking the zoloft every day and she says she can tell that it  Isn't doing any good. She gets in crying moods and gets very frustrated. Please advise.

## 2015-01-22 NOTE — Telephone Encounter (Signed)
Spoke with patient and explained plan, patient understands

## 2015-01-22 NOTE — Telephone Encounter (Signed)
Lm to cb.

## 2015-02-05 ENCOUNTER — Other Ambulatory Visit: Payer: Self-pay | Admitting: Emergency Medicine

## 2015-02-06 ENCOUNTER — Ambulatory Visit (INDEPENDENT_AMBULATORY_CARE_PROVIDER_SITE_OTHER): Payer: Medicare Other | Admitting: Emergency Medicine

## 2015-02-06 ENCOUNTER — Encounter: Payer: Self-pay | Admitting: Emergency Medicine

## 2015-02-06 ENCOUNTER — Ambulatory Visit (INDEPENDENT_AMBULATORY_CARE_PROVIDER_SITE_OTHER): Payer: Medicare Other

## 2015-02-06 VITALS — BP 147/90 | HR 62 | Temp 98.0°F | Resp 16 | Ht 62.0 in | Wt 244.2 lb

## 2015-02-06 DIAGNOSIS — M25562 Pain in left knee: Secondary | ICD-10-CM

## 2015-02-06 DIAGNOSIS — F4321 Adjustment disorder with depressed mood: Secondary | ICD-10-CM

## 2015-02-06 DIAGNOSIS — Z79899 Other long term (current) drug therapy: Secondary | ICD-10-CM

## 2015-02-06 DIAGNOSIS — E038 Other specified hypothyroidism: Secondary | ICD-10-CM | POA: Diagnosis not present

## 2015-02-06 DIAGNOSIS — M25561 Pain in right knee: Secondary | ICD-10-CM | POA: Diagnosis not present

## 2015-02-06 DIAGNOSIS — M549 Dorsalgia, unspecified: Secondary | ICD-10-CM

## 2015-02-06 DIAGNOSIS — I10 Essential (primary) hypertension: Secondary | ICD-10-CM | POA: Diagnosis not present

## 2015-02-06 MED ORDER — FLUOXETINE HCL (PMDD) 10 MG PO CAPS: ORAL_CAPSULE | ORAL | Status: DC

## 2015-02-06 MED ORDER — LEVOTHYROXINE SODIUM 100 MCG PO TABS: 100.0000 ug | ORAL_TABLET | Freq: Every day | ORAL | Status: DC

## 2015-02-06 MED ORDER — AMLODIPINE BESYLATE 2.5 MG PO TABS: 2.5000 mg | ORAL_TABLET | Freq: Every day | ORAL | Status: DC

## 2015-02-06 MED ORDER — MELOXICAM 15 MG PO TABS: 15.0000 mg | ORAL_TABLET | ORAL | Status: DC | PRN

## 2015-02-06 NOTE — Progress Notes (Addendum)
Subjective:  This chart was scribed for Alyssa Russian, MD by Alyssa Jefferson, at [facility].  This patient was seen in room  2 and the patient's care was started at 11:41 AM.    Patient ID: Alyssa Jefferson, female    DOB: 1945-12-02, 69 y.o.   MRN: [id number] Chief Complaint  Patient presents with  . Back Pain    4-5 months interferring with walking  . Medication Refill    amlodipine and levothyroxine    HPI  HPI Comments: Alyssa Jefferson is a 69 y.o. female who presents to the [facility] complaining of worsening lower back pain for the last 4-5 months which is now interfering with her walking. She thinks that she may have injured it helping her sister get out of bed. She has taken Advil but denies any relief and has not received any x-rays.  She denies any weakness/numbness or any pain radiating down to her leg.    Medication: She states that the lasix she started taking started hurting her back so she stopped taking them.  Her leg swelling is "doing all right". She is also requesting medication refill for her Amlodipine and Levothyroxine.   Depression: She states that the Zoloft is not helping her and she is unsure if she would like to get some counseling.  She has moments where when she hears others talk, she just wants to tell them to "shut up" and doesn't want to listen to anyone talk. She recognizes that this is uncharacteristic of who she is and would like a change in medication.    Knee pain: Patient would like to be referred to an orthopedist regarding her left sided knee pain.  Patient states that her right knee also "cracks" a lot.    Patient Active Problem List   Diagnosis Date Noted  . Palpitations 10/17/2014  . OSA (obstructive sleep apnea) 10/16/2012  . BMI 40.0-44.9, adult 12/20/2011  . Hypothyroid 12/20/2011  . 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 Osteoarthritis, knee 12/20/2011  . Other and  unspecified hyperlipidemia 12/20/2011  . Depressive disorder, not elsewhere classified 12/20/2011  . Unspecified essential hypertension 12/20/2011  . Anemia 12/20/2011  . High cholesterol   . Cancer    Past Medical History  Diagnosis Date  . High cholesterol   . Hypertension   . Depression   . Cancer 2004    breast  . Glaucoma   . Thyroid disease     thyroid removed  . OSA (obstructive sleep apnea)     tried CPAP- but unsuccessful  . Shortness of breath dyspnea   . Hypothyroidism   . GERD (gastroesophageal reflux disease)   . Arthritis     knees, back   Past Surgical History  Procedure Laterality Date  . Breast surgery  2004    left breast removed due to cancer no chemo or radiation  . Abdominal hysterectomy  82    BSO  . Thyroid removed      due to knot  . Knee arthroscopy Left    Allergies  Allergen Reactions  . Codeine Other (See Comments)    confusion  . Lipitor [Atorvastatin Calcium] Itching   Prior to Admission medications   Medication Sig Start Date End Date Taking? Authorizing Provider  albuterol (PROVENTIL) (2.5 MG/3ML) 0.083% nebulizer solution Take 3 mLs (2.5 mg total) by nebulization every 6 (six) hours as needed for wheezing or shortness of breath.  09/19/14   Thao P Le, DO  amLODipine (NORVASC) 2.5 MG tablet Take 1 tablet (2.5 mg total) by mouth daily. 10/31/14   Alyssa Russian, MD  Azelastine HCl 0.15 % SOLN Place 2 sprays into the nose 2 (two) times daily. 05/15/14   Shawnee Knapp, MD  conjugated estrogens (PREMARIN) vaginal cream Place one applicator at bedtime twice a week. Patient not taking: Reported on 10/31/2014 06/06/14   Ezekiel Slocumb, PA-C  fluticasone [facility]) 50 MCG/ACT nasal spray Place into both nostrils daily.    Historical Provider, MD  furosemide (LASIX) 20 MG tablet Take one tablet 2 times a week. 12/31/14   Alyssa Russian, MD  latanoprost (XALATAN) 0.005 % ophthalmic solution Place 1 drop into the right eye at bedtime.     Historical Provider,  MD  levothyroxine (SYNTHROID) 100 MCG tablet Take 1 tablet (100 mcg total) by mouth daily before breakfast. 01/09/15   Alyssa Russian, MD  losartan (COZAAR) 100 MG tablet Take 1 tablet (100 mg total) by mouth daily. 12/31/14   Alyssa Russian, MD  meloxicam (MOBIC) 15 MG tablet Take 15 mg by mouth as needed for pain.    Historical Provider, MD  pravastatin (PRAVACHOL) 40 MG tablet Take 1 tablet by mouth daily. 07/14/14   Historical Provider, MD  PRESCRIPTION MEDICATION Ventolin inhaler taking    Historical Provider, MD  PRESCRIPTION MEDICATION Pantoprazole Sod DR 40 mg take one tablet by mouth once daily    Historical Provider, MD  sertraline (ZOLOFT) 50 MG tablet Take one half tablet a day for the first 2 weeks then 1 tablet daily 10/31/14   Alyssa Russian, MD  zoster vaccine live, PF, (ZOSTAVAX) 96789 UNT/0.65ML injection Inject 19,400 Units into the skin once. 12/31/14   Alyssa Russian, MD   History   Social History  . Marital Status: Widowed    Spouse Name: N/A  . Number of Children: N/A  . Years of Education: N/A   Occupational History  . retired    Social History Main Topics  . Smoking status: Former Smoker -- 20 years    Types: Cigarettes    Quit date: 08/16/1978  . Smokeless tobacco: Never Used     Comment: 1 pack per week--10/16/12  . Alcohol Use: No  . Drug Use: No  . Sexual Activity: No   Other Topics Concern  . Not on file   Social History Narrative     Current Outpatient Prescriptions on File Prior to Visit  Medication Sig Dispense Refill  . albuterol (PROVENTIL) (2.5 MG/3ML) 0.083% nebulizer solution Take 3 mLs (2.5 mg total) by nebulization every 6 (six) hours as needed for wheezing or shortness of breath. 150 mL 1  . amLODipine (NORVASC) 2.5 MG tablet Take 1 tablet (2.5 mg total) by mouth daily. 30 tablet 11  . Azelastine HCl 0.15 % SOLN Place 2 sprays into the nose 2 (two) times daily. 30 mL 1  . fluticasone (FLONASE) 50 MCG/ACT nasal spray Place into both nostrils  daily.    Marland Kitchen latanoprost (XALATAN) 0.005 % ophthalmic solution Place 1 drop into the right eye at bedtime.     Marland Kitchen levothyroxine (SYNTHROID) 100 MCG tablet Take 1 tablet (100 mcg total) by mouth daily before breakfast. 30 tablet 4  . losartan (COZAAR) 100 MG tablet Take 1 tablet (100 mg total) by mouth daily. 90 tablet 3  . pravastatin (PRAVACHOL) 40 MG tablet Take 1 tablet by mouth daily.  0  .  PRESCRIPTION MEDICATION Ventolin inhaler taking    . PRESCRIPTION MEDICATION Pantoprazole Sod DR 40 mg take one tablet by mouth once daily    . sertraline (ZOLOFT) 50 MG tablet Take one half tablet a day for the first 2 weeks then 1 tablet daily 30 tablet 11  . conjugated estrogens (PREMARIN) vaginal cream Place one applicator at bedtime twice a week. (Patient not taking: Reported on 10/31/2014) 42.5 g 3  . furosemide (LASIX) 20 MG tablet Take one tablet 2 times a week. (Patient not taking: Reported on 02/06/2015) 30 tablet 3  . meloxicam (MOBIC) 15 MG tablet Take 15 mg by mouth as needed for pain.    Marland Kitchen zoster vaccine live, PF, (ZOSTAVAX) 97353 UNT/0.65ML injection Inject 19,400 Units into the skin once. 1 each 0   No current facility-administered medications on file prior to visit.    Allergies  Allergen Reactions  . Codeine Other (See Comments)    confusion  . Lipitor [Atorvastatin Calcium] Itching       Review of Systems  Constitutional: Negative for fever and chills.  Eyes: Negative for pain, discharge and itching.  Respiratory: Negative for cough, choking and shortness of breath.   Gastrointestinal: Negative for nausea and vomiting.  Musculoskeletal: Positive for back pain. Negative for neck pain and neck stiffness.       Objective:   Physical Exam  CONSTITUTIONAL: Well developed/well nourished HEAD: Normocephalic/atraumatic EYES: EOMI/PERRL ENMT: Mucous membranes moist NECK: supple no meningeal signs SPINE/BACK:entire spine non tender, She is tender over the lower lumbar spine CV:  S1/S2 noted, no murmurs/rubs/gallops noted LUNGS: Lungs are clear to auscultation bilaterally, no apparent distress ABDOMEN: soft, nontender, no rebound or guarding, bowel sounds noted throughout abdomen GU:no cva tenderness NEURO: Pt is awake/alert/appropriate, moves all extremitiesx4.  No facial droop.   EXTREMITIES: She has significant degenerative changes on both knees with crepitation.  SKIN: warm, color normal PSYCH: no abnormalities of mood noted, alert and oriented to situation    Filed Vitals:   02/06/15 1115  BP: 147/90  Pulse: 62  Temp: 98 F (36.7 C)  TempSrc: Oral  Resp: 16  Height: 5\' 2"  (1.575 m)  Weight: 244 lb 3.2 oz (110.768 kg)  SpO2: 98%   [facility] reading (PRIMARY) by  Dr.Daub LS-spine films show lower lumbar degenerative disc disease with arthritic changes.         Assessment & Plan:  She was not able to tolerate her Zoloft. I have changed her to Prozac 10 mg 1 a day. She has degenerative changes on her back film. She has significant arthritic changes in her knee. She is referred to [facility] for evaluation of this.I personally performed the services described in this documentation, which was scribed in my presence. The recorded information has been reviewed and is accurate.  Nena Jordan, MD

## 2015-02-06 NOTE — Patient Instructions (Signed)

## 2015-02-12 ENCOUNTER — Emergency Department (HOSPITAL_COMMUNITY)
Admission: EM | Admit: 2015-02-12 | Discharge: 2015-02-12 | Disposition: A | Discharge status: Home / Self Care | Payer: Medicare Other | Attending: Emergency Medicine | Admitting: Emergency Medicine

## 2015-02-12 ENCOUNTER — Encounter (HOSPITAL_COMMUNITY): Payer: Self-pay | Admitting: *Deleted

## 2015-02-12 DIAGNOSIS — E039 Hypothyroidism, unspecified: Secondary | ICD-10-CM | POA: Diagnosis not present

## 2015-02-12 DIAGNOSIS — Y998 Other external cause status: Secondary | ICD-10-CM | POA: Diagnosis not present

## 2015-02-12 DIAGNOSIS — Z79899 Other long term (current) drug therapy: Secondary | ICD-10-CM | POA: Diagnosis not present

## 2015-02-12 DIAGNOSIS — F329 Major depressive disorder, single episode, unspecified: Secondary | ICD-10-CM | POA: Insufficient documentation

## 2015-02-12 DIAGNOSIS — Y9389 Activity, other specified: Secondary | ICD-10-CM | POA: Insufficient documentation

## 2015-02-12 DIAGNOSIS — W228XXA Striking against or struck by other objects, initial encounter: Secondary | ICD-10-CM | POA: Insufficient documentation

## 2015-02-12 DIAGNOSIS — Z853 Personal history of malignant neoplasm of breast: Secondary | ICD-10-CM | POA: Diagnosis not present

## 2015-02-12 DIAGNOSIS — E780 Pure hypercholesterolemia: Secondary | ICD-10-CM | POA: Insufficient documentation

## 2015-02-12 DIAGNOSIS — S0993XA Unspecified injury of face, initial encounter: Secondary | ICD-10-CM | POA: Diagnosis not present

## 2015-02-12 DIAGNOSIS — Z8719 Personal history of other diseases of the digestive system: Secondary | ICD-10-CM | POA: Diagnosis not present

## 2015-02-12 DIAGNOSIS — G4733 Obstructive sleep apnea (adult) (pediatric): Secondary | ICD-10-CM | POA: Insufficient documentation

## 2015-02-12 DIAGNOSIS — S00512A Abrasion of oral cavity, initial encounter: Secondary | ICD-10-CM | POA: Diagnosis not present

## 2015-02-12 DIAGNOSIS — Z87891 Personal history of nicotine dependence: Secondary | ICD-10-CM | POA: Diagnosis not present

## 2015-02-12 DIAGNOSIS — I10 Essential (primary) hypertension: Secondary | ICD-10-CM | POA: Diagnosis not present

## 2015-02-12 DIAGNOSIS — Y929 Unspecified place or not applicable: Secondary | ICD-10-CM | POA: Diagnosis not present

## 2015-02-12 DIAGNOSIS — H409 Unspecified glaucoma: Secondary | ICD-10-CM | POA: Insufficient documentation

## 2015-02-12 DIAGNOSIS — Z7951 Long term (current) use of inhaled steroids: Secondary | ICD-10-CM | POA: Insufficient documentation

## 2015-02-12 DIAGNOSIS — Z8739 Personal history of other diseases of the musculoskeletal system and connective tissue: Secondary | ICD-10-CM | POA: Insufficient documentation

## 2015-02-12 MED ORDER — LIDOCAINE-EPINEPHRINE (PF) 2 %-1:200000 IJ SOLN
20.0000 mL | Freq: Once | INTRAMUSCULAR | Status: AC
  Administered 2015-02-12: 20 mL
  Filled 2015-02-12: qty 20

## 2015-02-12 NOTE — ED Notes (Signed)
Patient came into ED today d/t hard palate bleed. Patient stated that she was brushing her teeth when she injured area. Patient has been unable to stop bleeding. Blood thinners denied

## 2015-02-12 NOTE — ED Provider Notes (Signed)
CSN: [id number]     Arrival date & time 02/12/15  1503 History  This chart was scribed for non-physician practitioner, Montine Circle, PA-C working with Lacretia Leigh, MD by Rayna Sexton, ED scribe. This patient was seen in room WTR6/WTR6 and the patient's care was started at [facility].    Chief Complaint  Patient presents with  . Mouth Injury   The history is provided by the patient. No language interpreter was used.    HPI Comments: Alyssa Jefferson is a 69 y.o. female who presents to the Emergency Department complaining of constant, mild, bleeding to her upper mouth with onset this morning s/p brushing her teeth. Pt notes hitting the roof of her mouth with her toothbrush with her symptoms beginning shortly after. She denies taking any blood thinners but does note taking medication for her HTN. Pt denies any other associated symptoms.   Past Medical History  Diagnosis Date  . High cholesterol   . Hypertension   . Depression   . Cancer 2004    breast  . Glaucoma   . Thyroid disease     thyroid removed  . OSA (obstructive sleep apnea)     tried CPAP- but unsuccessful  . Shortness of breath dyspnea   . Hypothyroidism   . GERD (gastroesophageal reflux disease)   . Arthritis     knees, back   Past Surgical History  Procedure Laterality Date  . Breast surgery  2004    left breast removed due to cancer no chemo or radiation  . Abdominal hysterectomy  83    BSO  . Thyroid removed      due to knot  . Knee arthroscopy Left    Family History  Problem Relation Age of Onset  . Lung cancer Father   . Breast cancer Sister   . Ovarian cancer Sister    History  Substance Use Topics  . Smoking status: Former Smoker -- 20 years    Types: Cigarettes    Quit date: 08/16/1978  . Smokeless tobacco: Never Used     Comment: 1 pack per week--10/16/12  . Alcohol Use: No   OB History    Gravida Para Term Preterm AB TAB SAB Ectopic Multiple Living   0              Review of Systems   Skin: Positive for wound.      Allergies  Codeine and Lipitor  Home Medications   Prior to Admission medications   Medication Sig Start Date End Date Taking? Authorizing Provider  albuterol (PROVENTIL) (2.5 MG/3ML) 0.083% nebulizer solution Take 3 mLs (2.5 mg total) by nebulization every 6 (six) hours as needed for wheezing or shortness of breath. 09/19/14   Thao P Le, DO  amLODipine (NORVASC) 2.5 MG tablet Take 1 tablet (2.5 mg total) by mouth daily. 02/06/15   Darlyne Russian, MD  Azelastine HCl 0.15 % SOLN Place 2 sprays into the nose 2 (two) times daily. 05/15/14   Shawnee Knapp, MD  conjugated estrogens (PREMARIN) vaginal cream Place one applicator at bedtime twice a week. Patient not taking: Reported on 10/31/2014 06/06/14   Ezekiel Slocumb, PA-C  Fluoxetine HCl, PMDD, 10 MG CAPS Take 1 capsule daily 02/06/15   Darlyne Russian, MD  fluticasone [facility]) 50 MCG/ACT nasal spray Place into both nostrils daily.    Historical Provider, MD  furosemide (LASIX) 20 MG tablet Take one tablet 2 times a week. Patient not taking: Reported on 02/06/2015  12/31/14   Darlyne Russian, MD  latanoprost (XALATAN) 0.005 % ophthalmic solution Place 1 drop into the right eye at bedtime.     Historical Provider, MD  levothyroxine (SYNTHROID) 100 MCG tablet Take 1 tablet (100 mcg total) by mouth daily before breakfast. 02/06/15   Darlyne Russian, MD  losartan (COZAAR) 100 MG tablet Take 1 tablet (100 mg total) by mouth daily. 12/31/14   Darlyne Russian, MD  meloxicam (MOBIC) 15 MG tablet Take 1 tablet (15 mg total) by mouth as needed for pain. 02/06/15   Darlyne Russian, MD  pravastatin (PRAVACHOL) 40 MG tablet Take 1 tablet by mouth daily. 07/14/14   Historical Provider, MD  PRESCRIPTION MEDICATION Ventolin inhaler taking    Historical Provider, MD  PRESCRIPTION MEDICATION Pantoprazole Sod DR 40 mg take one tablet by mouth once daily    Historical Provider, MD  zoster vaccine live, PF, (ZOSTAVAX) 16109 UNT/0.65ML injection Inject  19,400 Units into the skin once. 12/31/14   Darlyne Russian, MD   BP 143/63 mmHg  Pulse 62  Temp(Src) 98.9 F (37.2 C) (Oral)  Resp 16  SpO2 98% Physical Exam  Constitutional: She is oriented to person, place, and time. She appears well-developed and well-nourished. No distress.  HENT:  Head: Normocephalic and atraumatic.  Mouth/Throat: Oropharynx is clear and moist.  Pin sized sore on roof of mouth with scant bleeding, no evidence of dental fracture, or other oral trauma  Eyes: Conjunctivae and EOM are normal. Pupils are equal, round, and reactive to light.  Neck: Normal range of motion. Neck supple. No tracheal deviation present.  Cardiovascular: Normal rate.   Pulmonary/Chest: Effort normal and breath sounds normal. No respiratory distress.  Abdominal: Soft. She exhibits no distension.  Musculoskeletal: Normal range of motion.  Neurological: She is alert and oriented to person, place, and time.  Skin: Skin is warm and dry.  Psychiatric: She has a normal mood and affect. Her behavior is normal.  Nursing note and vitals reviewed.   ED Course  Procedures  DIAGNOSTIC STUDIES: Oxygen Saturation is 98% on RA, normal by my interpretation.    COORDINATION OF CARE: 3:26 PM Discussed treatment plan with pt at bedside and pt agreed to plan.  Labs Review Labs Reviewed - No data to display  Imaging Review No results found.   EKG Interpretation None      MDM   Final diagnoses:  Mouth injury, initial encounter    Patient with very small abrasion to the roof of her mouth that using blood. Patient is very concerned about the blood loss, even though it is a very small amount. Will attempt to control bleeding with lidocaine with epinephrine via topical application. Otherwise, patient will be fine for discharge. Bleeding should stop on its own. There is no evidence of dental fracture, palate injury, or other traumatic process. Patient is stable and ready for discharge.  I personally  performed the services described in this documentation, which was scribed in my presence. The recorded information has been reviewed and is accurate.     Montine Circle, PA-C 02/12/15 1604  Lacretia Leigh, MD 02/13/15 [phone]

## 2015-02-12 NOTE — ED Notes (Signed)
Questions r/t dc were denied. Pt ambulatory and a&ox4 

## 2015-02-12 NOTE — Discharge Instructions (Signed)
Oral Ulcers Oral ulcers are painful, shallow sores around the lining of the mouth. They can affect the gums, the inside of the lips, and the cheeks. (Sores on the outside of the lips and on the face are different.) They typically first occur in school-aged children and teenagers. Oral ulcers may also be called canker sores or cold sores. CAUSES  Canker sores and cold sores can be caused by many factors including:  Infection.  Injury.  Sun exposure.  Medications.  Emotional stress.  Food allergies.  Vitamin deficiencies.  Toothpastes containing sodium lauryl sulfate. The herpes virus can be the cause of mouth ulcers. The first infection can be severe and cause 10 or more ulcers on the gums, tongue, and lips with fever and difficulty in swallowing. This infection usually occurs between the ages of 1 and 3 years.  SYMPTOMS  The typical sore is about  inch (6 mm) in size and is an oval or round ulcer with red borders. DIAGNOSIS  Your caregiver can diagnose simple oral ulcers by examination. Additional testing is usually not required.  TREATMENT  Treatment is aimed at pain relief. Generally, oral ulcers resolve by themselves within 1 to 2 weeks without medication and are not contagious unless caused by herpes (and other viruses). Antibiotics are not effective with mouth sores. Avoid direct contact with others until the ulcer is completely healed. See your caregiver for follow-up care as recommended. Also:  Offer a soft diet.  Encourage plenty of fluids to prevent dehydration. Popsicles and milk shakes can be helpful.  Avoid acidic and salty foods and drinks such as orange juice.  Infants and young children will often refuse to drink because of pain. Using a teaspoon, cup, or syringe to give small amounts of fluids frequently can help prevent dehydration.  Cold compresses on the face may help reduce pain.  Pain medication can help control soreness.  A solution of diphenhydramine  mixed with a liquid antacid can be useful to decrease the soreness of ulcers. Consult a caregiver for the dosing.  Liquids or ointments with a numbing ingredient may be helpful when used as recommended.  Older children and teenagers can rinse their mouth with a salt-water mixture (1/2 teaspoon of salt in 8 ounces of water) four times a day. This treatment is uncomfortable but may reduce the time the ulcers are present.  There are many over-the-counter throat lozenges and medications available for oral ulcers. Their effectiveness has not been studied.  Consult your medical caregiver prior to using homeopathic treatments for oral ulcers. SEEK MEDICAL CARE IF:   You think your child needs to be seen.  The pain worsens and you cannot control it.  There are 4 or more ulcers.  The lips and gums begin to bleed and crust.  A single mouth ulcer is near a tooth that is causing a toothache or pain.  Your child has a fever, swollen face, or swollen glands.  The ulcers began after starting a medication.  Mouth ulcers keep reoccurring or last more than 2 weeks.  You think your child is not taking adequate fluids. SEEK IMMEDIATE MEDICAL CARE IF:   Your child has a high fever.  Your child is unable to swallow or becomes dehydrated.  Your child looks or acts very ill.  An ulcer caused by a chemical your child accidentally put in their mouth. Document Released: 09/09/2004 Document Revised: 12/17/2013 Document Reviewed: 04/24/2009 Exit[employer] Patient Information 2015 [employer]. This information is not intended to replace advice   given to you by your health care provider. Make sure you discuss any questions you have with your health care provider.  

## 2015-02-15 ENCOUNTER — Other Ambulatory Visit: Payer: Self-pay | Admitting: Radiology

## 2015-02-15 DIAGNOSIS — M5136 Other intervertebral disc degeneration, lumbar region: Secondary | ICD-10-CM

## 2015-02-15 DIAGNOSIS — M549 Dorsalgia, unspecified: Secondary | ICD-10-CM

## 2015-02-28 ENCOUNTER — Telehealth: Payer: Self-pay | Admitting: Emergency Medicine

## 2015-02-28 NOTE — Telephone Encounter (Signed)
Patient states that the Zoloft is not working for her. She states that she is crying more.  [phone]

## 2015-02-28 NOTE — Telephone Encounter (Signed)
Please advise 

## 2015-03-01 ENCOUNTER — Other Ambulatory Visit: Payer: Self-pay | Admitting: Emergency Medicine

## 2015-03-01 ENCOUNTER — Telehealth: Payer: Self-pay | Admitting: Emergency Medicine

## 2015-03-01 DIAGNOSIS — F4329 Adjustment disorder with other symptoms: Secondary | ICD-10-CM

## 2015-03-01 DIAGNOSIS — F4381 Prolonged grief disorder: Secondary | ICD-10-CM

## 2015-03-01 MED ORDER — LORAZEPAM 0.5 MG PO TABS: ORAL_TABLET | ORAL | Status: DC

## 2015-03-01 NOTE — Telephone Encounter (Signed)
I called and spoke with the patient. She is having bad thoughts and states she would not harm herself. She is crying all the time. She is currently seen in the hospice counselor. She feels the Zoloft is not helping. I agreed to stop the Zoloft. She will be on Ativan 0.5 twice a day. She is to see me in the next couple weeks at 102 so we can see how she is doing. She agrees that if she has bad thoughts of hurting herself she will come in and talk to me about it

## 2015-03-05 ENCOUNTER — Inpatient Hospital Stay: Admission: RE | Admit: 2015-03-05 | Payer: Medicare Other | Source: Ambulatory Visit

## 2015-03-15 ENCOUNTER — Ambulatory Visit
Admission: RE | Admit: 2015-03-15 | Discharge: 2015-03-15 | Disposition: A | Discharge status: Home / Self Care | Payer: Medicare Other | Source: Ambulatory Visit | Attending: Emergency Medicine | Admitting: Emergency Medicine

## 2015-03-15 DIAGNOSIS — M5136 Other intervertebral disc degeneration, lumbar region: Secondary | ICD-10-CM

## 2015-03-15 DIAGNOSIS — M549 Dorsalgia, unspecified: Secondary | ICD-10-CM

## 2015-03-18 DIAGNOSIS — M25562 Pain in left knee: Secondary | ICD-10-CM | POA: Diagnosis not present

## 2015-03-18 DIAGNOSIS — M1712 Unilateral primary osteoarthritis, left knee: Secondary | ICD-10-CM | POA: Diagnosis not present

## 2015-04-07 ENCOUNTER — Other Ambulatory Visit: Payer: Self-pay | Admitting: Family Medicine

## 2015-04-28 ENCOUNTER — Ambulatory Visit (INDEPENDENT_AMBULATORY_CARE_PROVIDER_SITE_OTHER): Payer: Medicare Other

## 2015-04-28 ENCOUNTER — Ambulatory Visit (INDEPENDENT_AMBULATORY_CARE_PROVIDER_SITE_OTHER): Payer: Medicare Other | Admitting: Emergency Medicine

## 2015-04-28 VITALS — BP 130/78 | HR 77 | Temp 97.9°F | Resp 18 | Ht 62.0 in | Wt 251.2 lb

## 2015-04-28 DIAGNOSIS — M170 Bilateral primary osteoarthritis of knee: Secondary | ICD-10-CM | POA: Diagnosis not present

## 2015-04-28 DIAGNOSIS — M25561 Pain in right knee: Secondary | ICD-10-CM

## 2015-04-28 LAB — COMPREHENSIVE METABOLIC PANEL
ALT: 11 U/L (ref 6–29)
AST: 12 U/L (ref 10–35)
Albumin: 3.8 g/dL (ref 3.6–5.1)
Alkaline Phosphatase: 79 U/L (ref 33–130)
BUN: 24 mg/dL (ref 7–25)
CO2: 28 mmol/L (ref 20–31)
Calcium: 9 mg/dL (ref 8.6–10.4)
Chloride: 105 mmol/L (ref 98–110)
Creat: 1.06 mg/dL — ABNORMAL HIGH (ref 0.50–0.99)
Glucose, Bld: 82 mg/dL (ref 65–99)
Potassium: 4 mmol/L (ref 3.5–5.3)
Sodium: 141 mmol/L (ref 135–146)
Total Bilirubin: 0.9 mg/dL (ref 0.2–1.2)
Total Protein: 7 g/dL (ref 6.1–8.1)

## 2015-04-28 LAB — URIC ACID: Uric Acid, Serum: 5.8 mg/dL (ref 2.4–7.0)

## 2015-04-28 LAB — D-DIMER, QUANTITATIVE: D-Dimer, Quant: 0.47 ug/mL-FEU (ref 0.00–0.48)

## 2015-04-28 LAB — CBC
HCT: 34.6 % — ABNORMAL LOW (ref 36.0–46.0)
Hemoglobin: 11.3 g/dL — ABNORMAL LOW (ref 12.0–15.0)
MCH: 25.6 pg — ABNORMAL LOW (ref 26.0–34.0)
MCHC: 32.7 g/dL (ref 30.0–36.0)
MCV: 78.5 fL (ref 78.0–100.0)
MPV: 8.8 fL (ref 8.6–12.4)
Platelets: 320 10*3/uL (ref 150–400)
RBC: 4.41 MIL/uL (ref 3.87–5.11)
RDW: 14.4 % (ref 11.5–15.5)
WBC: 5.9 10*3/uL (ref 4.0–10.5)

## 2015-04-28 NOTE — Progress Notes (Signed)
[facility] [address] [place] [address] [phone]  Date:  04/28/2015   Name:  Alyssa Jefferson   DOB:  01/21/1946   MRN:  [id number]  PCP:  Jenny Reichmann, MD    Chief Complaint: Knee Pain   History of Present Illness:  This is a 69 y.o. female with PMH HTN, OSA, obesity, depression, knee OA, hypothyroidism who is presenting with right knee pain that started yesterday. Pain is described as sharp. She woke in the morning with the pain. The pain is causing her to limp. Pain located to medial and posterior knee. She denies fever, redness of the skin or swelling. She does not have a hx of gout or blood clot. She sees an orthopedist for her left knee OA and is planning to have a knee replacement in December 2016. She has never had problems with her right knee other than "cracking" before yesterday. She has taken several meds for pain in the past that have not been helpful. She is prescribed meloxicam by Dr. Everlene Farrier and states she was taking daily until 4 days ago when she stopped because she realized it wasn't helping. She has tried tramadol and voltaren gel without any relief. She is allergic to codeine. She states tylenol generally isn't helpful but she has never taken more than 1 tablet at a time.  Next appt with ortho in November.  Review of Systems:  Review of Systems See HPI  Patient Active Problem List   Diagnosis Date Noted  . Palpitations 10/17/2014  . OSA (obstructive sleep apnea) 10/16/2012  . BMI 40.0-44.9, adult 12/20/2011  . Hypothyroid 12/20/2011  . 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 Osteoarthritis, knee 12/20/2011  . Other and unspecified hyperlipidemia 12/20/2011  . Depressive disorder, not elsewhere classified 12/20/2011  . Unspecified essential hypertension 12/20/2011  . Anemia 12/20/2011  . Cancer     Prior to Admission medications   Medication Sig Start Date End Date Taking? Authorizing Provider   albuterol (PROVENTIL) (2.5 MG/3ML) 0.083% nebulizer solution Take 3 mLs (2.5 mg total) by nebulization every 6 (six) hours as needed for wheezing or shortness of breath. 09/19/14  Yes Thao P Le, DO  amLODipine (NORVASC) 2.5 MG tablet Take 1 tablet (2.5 mg total) by mouth daily. 02/06/15  Yes Darlyne Russian, MD  Azelastine HCl 0.15 % SOLN Place 2 sprays into the nose 2 (two) times daily. 05/15/14  Yes Shawnee Knapp, MD  Fluoxetine HCl, PMDD, 10 MG CAPS Take 1 capsule daily 02/06/15  Yes Darlyne Russian, MD  fluticasone (FLONASE) 50 MCG/ACT nasal spray Place into both nostrils daily.   Yes Historical Provider, MD  latanoprost (XALATAN) 0.005 % ophthalmic solution Place 1 drop into the right eye at bedtime.    Yes Historical Provider, MD  levothyroxine (SYNTHROID) 100 MCG tablet Take 1 tablet (100 mcg total) by mouth daily before breakfast. 02/06/15  Yes Darlyne Russian, MD  LORazepam (ATIVAN) 0.5 MG tablet Take 1 tablet twice a day as needed for stress 03/01/15  Yes Darlyne Russian, MD  losartan (COZAAR) 100 MG tablet Take 1 tablet (100 mg total) by mouth daily. 12/31/14  Yes Darlyne Russian, MD  meloxicam (MOBIC) 15 MG tablet Take 1 tablet (15 mg total) by mouth as needed for pain. 02/06/15  Yes Darlyne Russian, MD  pantoprazole (PROTONIX) 40 MG tablet TAKE 1 TABLET BY MOUTH ONCE DAILY.  "OV NEEDED 04/08/15  Yes Chelle  Jeffery, PA-C  pravastatin (PRAVACHOL) 40 MG tablet Take 1 tablet by mouth daily. 07/14/14  Yes Historical Provider, MD  PRESCRIPTION MEDICATION Ventolin inhaler taking   Yes Historical Provider, MD  PRESCRIPTION MEDICATION Pantoprazole Sod DR 40 mg take one tablet by mouth once daily   Yes Historical Provider, MD                  Allergies  Allergen Reactions  . Codeine Other (See Comments)    confusion  . Lipitor [Atorvastatin Calcium] Itching    Past Surgical History  Procedure Laterality Date  . Breast surgery  2004    left breast removed due to cancer no chemo or radiation  . Abdominal  hysterectomy  83    BSO  . Thyroid removed      due to knot  . Knee arthroscopy Left     Social History  Substance Use Topics  . Smoking status: Former Smoker -- 20 years    Types: Cigarettes    Quit date: 08/16/1978  . Smokeless tobacco: Never Used     Comment: 1 pack per week--10/16/12  . Alcohol Use: No    Family History  Problem Relation Age of Onset  . Lung cancer Father   . Breast cancer Sister   . Ovarian cancer Sister     Medication list has been reviewed and updated.  Physical Examination:  Physical Exam  Constitutional: She is oriented to person, place, and time. She appears well-developed and well-nourished. No distress.  HENT:  Head: Normocephalic and atraumatic.  Right Ear: Hearing normal.  Left Ear: Hearing normal.  Nose: Nose normal.  Eyes: Conjunctivae and lids are normal. Right eye exhibits no discharge. Left eye exhibits no discharge. No scleral icterus.  Cardiovascular: Normal rate, regular rhythm, normal heart sounds and normal pulses.   No murmur heard. Pulmonary/Chest: Effort normal and breath sounds normal. No respiratory distress. She has no wheezes. She has no rhonchi. She has no rales.  Musculoskeletal:       Right knee: She exhibits normal range of motion, no swelling, no LCL laxity, normal patellar mobility, normal meniscus and no MCL laxity. Tenderness found. Medial joint line and MCL tenderness noted. No lateral joint line, no LCL and no patellar tendon tenderness noted.       Left knee: She exhibits decreased range of motion (mild decrease in flexion), swelling (large) and effusion.  Right posterior knee tenderness Left knee measuring 47 cm, right knee measuring 42 cm. No right calf tenderness Homans sign negative  Neurological: She is alert and oriented to person, place, and time. Gait (antalgic) abnormal.  Skin: Skin is warm, dry and intact. No lesion and no rash noted.  Psychiatric: She has a normal mood and affect. Her speech is normal  and behavior is normal. Thought content normal.   BP 130/78 mmHg  Pulse 77  Temp(Src) 97.9 F (36.6 C) (Oral)  Resp 18  Ht 5\' 2"  (1.575 m)  Wt 251 lb 3.2 oz (113.944 kg)  BMI 45.93 kg/m2  SpO2 97%  [facility] reading (PRIMARY) by  Dr. Ouida Sills: degenerative changes  Results for orders placed or performed in visit on 04/28/15  D-dimer, quantitative (not at [facility])  Result Value Ref Range   D-Dimer, Quant 0.47 0.00 - 0.48 ug/mL-FEU    Assessment and Plan:  1. Primary osteoarthritis of both knees 2. Right knee pain Right knee radiograph with moderate degenerative changes. D-dimer negative. CBC, CMP and uric acid pending although gout and infective  process unlikely based on history and exam. Advised she modify activity until pain reduces. Advised she stop using NSAIDs since she has stage III CKD with GFR 53. She will take tylenol 1 gm TID and see if helpful. Advised she call her orthopedist and make appt to discuss right knee OA sooner than next planned appt in November. - DG Knee Complete 4 Views Right; Future - D-dimer, quantitative (not at [facility]) - CBC - Comprehensive metabolic panel - Uric Acid   Benjaman Pott. Drenda Freeze, MHS [facility]  04/28/2015

## 2015-04-28 NOTE — Patient Instructions (Signed)
Stop meloxicam and don't take any NSAIDS - aleve, ibuprofen, advil, motrin Take tylenol 1000 mg 2-3 times a day and see if helpful. Call your orthopedist and make a sooner appointment to discuss your right knee. I will call you about your lab tests - will send you for an ultrasound of your leg if needed.

## 2015-04-30 NOTE — Progress Notes (Signed)
  Medical screening examination/treatment/procedure(s) were performed by non-physician practitioner and as supervising physician I was immediately available for consultation/collaboration.     

## 2015-05-09 DIAGNOSIS — I10 Essential (primary) hypertension: Secondary | ICD-10-CM | POA: Diagnosis not present

## 2015-05-09 DIAGNOSIS — E782 Mixed hyperlipidemia: Secondary | ICD-10-CM | POA: Diagnosis not present

## 2015-05-09 DIAGNOSIS — Z01818 Encounter for other preprocedural examination: Secondary | ICD-10-CM | POA: Diagnosis not present

## 2015-05-20 ENCOUNTER — Encounter: Payer: Self-pay | Admitting: Emergency Medicine

## 2015-06-01 ENCOUNTER — Other Ambulatory Visit: Payer: Self-pay | Admitting: Physician Assistant

## 2015-06-05 ENCOUNTER — Ambulatory Visit: Payer: Medicare Other | Admitting: Emergency Medicine

## 2015-06-06 ENCOUNTER — Telehealth: Payer: Self-pay

## 2015-06-06 NOTE — Telephone Encounter (Signed)
Pt called and wanted to know if she will be prescribed something else for acid reflux due to the other medication pantoprazole (PROTONIX) being refused.  Please advise  [phone]

## 2015-06-09 NOTE — Telephone Encounter (Signed)
Alyssa Hurry do you know which medication they would cover instead?

## 2015-06-10 NOTE — Telephone Encounter (Signed)
Started PA on covermymeds. Need to know if pt has tried any other GERD medications in the past (either Rx or OTC). LMOM for pt to CB.

## 2015-06-13 NOTE — Telephone Encounter (Signed)
Called pt again and reached her. She stated that she doesn't remember if she has taken any other meds for GERD, but it would be in her records. She has not tried anything OTC. I don't see any other GERD meds on her med hx. Completed form on covermymeds. Pending.

## 2015-06-16 NOTE — Telephone Encounter (Signed)
PA letter received that stated that this is on pt's med plan and no PA is needed. Pharm should contact Pharm Help Desk if they have problems. Notified pharm.

## 2015-06-17 ENCOUNTER — Ambulatory Visit (INDEPENDENT_AMBULATORY_CARE_PROVIDER_SITE_OTHER): Payer: Medicare Other | Admitting: Physician Assistant

## 2015-06-17 VITALS — BP 128/84 | HR 68 | Temp 97.6°F | Resp 16 | Ht 61.5 in | Wt 245.0 lb

## 2015-06-17 DIAGNOSIS — N952 Postmenopausal atrophic vaginitis: Secondary | ICD-10-CM

## 2015-06-17 DIAGNOSIS — L298 Other pruritus: Secondary | ICD-10-CM

## 2015-06-17 DIAGNOSIS — B3731 Acute candidiasis of vulva and vagina: Secondary | ICD-10-CM

## 2015-06-17 DIAGNOSIS — B373 Candidiasis of vulva and vagina: Secondary | ICD-10-CM | POA: Diagnosis not present

## 2015-06-17 DIAGNOSIS — N898 Other specified noninflammatory disorders of vagina: Secondary | ICD-10-CM

## 2015-06-17 LAB — POCT WET + KOH PREP
Trich by wet prep: ABSENT
Yeast by KOH: ABSENT

## 2015-06-17 MED ORDER — FLUCONAZOLE 150 MG PO TABS: 150.0000 mg | ORAL_TABLET | Freq: Once | ORAL | Status: DC

## 2015-06-17 NOTE — Progress Notes (Signed)
   Subjective:    Patient ID: Alyssa Jefferson, female    DOB: March 06, 1946, 69 y.o.   MRN: [id number]  HPI Patient presents for vaginal itching that has been present for the past month. Itching has gotten worse of late. Denies vaginal discharge, pelvic pain, abdominal pain, dysuria, frequency, bowel changes, N/V, fever, hematuria, or vaginal bleeding. Is not sexually active. States that has some discomfort when she wipes. Last pelvic 05/2014 was normal, however, given Premarin cream for atrophic vaginitis and did not feel it made a difference so d/c after one month. Had hysterectomy.   Allergies  Allergen Reactions  . Codeine Other (See Comments)    confusion  . Lipitor [Atorvastatin Calcium] Itching    Review of Systems As noted above.    Objective:   Physical Exam  Constitutional: She is oriented to person, place, and time. She appears well-developed and well-nourished. No distress.  Blood pressure 128/84, pulse 68, temperature 97.6 F (36.4 C), temperature source Oral, resp. rate 16, height 5' 1.5" (1.562 m), weight 245 lb (111.131 kg), SpO2 98 %.   HENT:  Head: Normocephalic and atraumatic.  Right Ear: External ear normal.  Left Ear: External ear normal.  Eyes: Conjunctivae are normal. Right eye exhibits no discharge. Left eye exhibits no discharge. No scleral icterus.  Pulmonary/Chest: Effort normal.  Abdominal: Soft. Bowel sounds are normal. She exhibits no distension and no mass. There is no tenderness. There is no rebound and no guarding.  Genitourinary: Vagina normal. No labial fusion. There is no rash, tenderness, lesion or injury on the right labia. There is no rash, tenderness, lesion or injury on the left labia. No erythema, tenderness or bleeding in the vagina. No foreign body around the vagina. No signs of injury around the vagina. No vaginal discharge found.  No rugae present.  Neurological: She is alert and oriented to person, place, and time.  Skin: Skin is warm and  dry. No rash noted. She is not diaphoretic. No erythema. No pallor.  Psychiatric: She has a normal mood and affect. Her behavior is normal. Judgment and thought content normal.    Results for orders placed or performed in visit on 06/17/15  POCT Wet + KOH Prep  Result Value Ref Range   Yeast by KOH Absent Present, Absent   Yeast by wet prep Present Present, Absent   WBC by wet prep Few None, Few, Too numerous to count   Clue Cells Wet Prep HPF POC None None, Too numerous to count   Trich by wet prep Absent Present, Absent   Bacteria Wet Prep HPF POC Few None, Few, Too numerous to count   Epithelial Cells By [employer] Pref ([facility]) Few None, Few, Too numerous to count   RBC,UR,HPF,POC None None RBC/hpf       Assessment & Plan:  1. Yeast vaginitis 2. Vaginal itching 3. Atrophic vaginitis Should re-start premarin as could make vaginal tissue more robust. - fluconazole (DIFLUCAN) 150 MG tablet; Take 1 tablet (150 mg total) by mouth once. Repeat if needed  Dispense: 2 tablet; Refill: 0 - POCT Wet + KOH Prep   Alveta Heimlich PA-C  [facility] 06/17/2015 12:34 PM

## 2015-06-17 NOTE — Patient Instructions (Signed)

## 2015-06-19 DIAGNOSIS — H25813 Combined forms of age-related cataract, bilateral: Secondary | ICD-10-CM | POA: Diagnosis not present

## 2015-06-19 DIAGNOSIS — H401112 Primary open-angle glaucoma, right eye, moderate stage: Secondary | ICD-10-CM | POA: Diagnosis not present

## 2015-06-19 DIAGNOSIS — H401121 Primary open-angle glaucoma, left eye, mild stage: Secondary | ICD-10-CM | POA: Diagnosis not present

## 2015-06-23 DIAGNOSIS — E782 Mixed hyperlipidemia: Secondary | ICD-10-CM | POA: Diagnosis not present

## 2015-06-23 DIAGNOSIS — I129 Hypertensive chronic kidney disease with stage 1 through stage 4 chronic kidney disease, or unspecified chronic kidney disease: Secondary | ICD-10-CM | POA: Diagnosis not present

## 2015-06-23 DIAGNOSIS — M25521 Pain in right elbow: Secondary | ICD-10-CM | POA: Diagnosis not present

## 2015-06-23 DIAGNOSIS — I10 Essential (primary) hypertension: Secondary | ICD-10-CM | POA: Diagnosis not present

## 2015-06-23 DIAGNOSIS — Z01818 Encounter for other preprocedural examination: Secondary | ICD-10-CM | POA: Diagnosis not present

## 2015-06-27 ENCOUNTER — Telehealth: Payer: Self-pay

## 2015-06-27 NOTE — Telephone Encounter (Signed)
Pt was seen on 11/1 by T. Brewington for Yeast vaginitis - Primary. She was prescribed fluconazole (DIFLUCAN) 150 MG tablet [id number]. She states she felt relief for 1 day and now the itching is extreme. Please advise at [phone]. Pharmacy is good.

## 2015-06-27 NOTE — Telephone Encounter (Signed)
Please advise 

## 2015-06-28 DIAGNOSIS — Z1231 Encounter for screening mammogram for malignant neoplasm of breast: Secondary | ICD-10-CM | POA: Diagnosis not present

## 2015-06-28 DIAGNOSIS — Z853 Personal history of malignant neoplasm of breast: Secondary | ICD-10-CM | POA: Diagnosis not present

## 2015-06-30 NOTE — Telephone Encounter (Signed)
Left vmail to call back to discuss symptoms and medication regimen.

## 2015-07-01 ENCOUNTER — Ambulatory Visit (INDEPENDENT_AMBULATORY_CARE_PROVIDER_SITE_OTHER): Payer: Medicare Other | Admitting: Emergency Medicine

## 2015-07-01 ENCOUNTER — Encounter: Payer: Self-pay | Admitting: Emergency Medicine

## 2015-07-01 VITALS — BP 147/74 | HR 75 | Temp 98.5°F | Resp 16 | Ht 62.0 in | Wt 241.0 lb

## 2015-07-01 DIAGNOSIS — I10 Essential (primary) hypertension: Secondary | ICD-10-CM

## 2015-07-01 DIAGNOSIS — J3489 Other specified disorders of nose and nasal sinuses: Secondary | ICD-10-CM

## 2015-07-01 DIAGNOSIS — B373 Candidiasis of vulva and vagina: Secondary | ICD-10-CM

## 2015-07-01 DIAGNOSIS — B3731 Acute candidiasis of vulva and vagina: Secondary | ICD-10-CM

## 2015-07-01 DIAGNOSIS — K219 Gastro-esophageal reflux disease without esophagitis: Secondary | ICD-10-CM | POA: Diagnosis not present

## 2015-07-01 LAB — BASIC METABOLIC PANEL WITH GFR
BUN: 22 mg/dL (ref 7–25)
CO2: 28 mmol/L (ref 20–31)
Calcium: 9.5 mg/dL (ref 8.6–10.4)
Chloride: 103 mmol/L (ref 98–110)
Creat: 1.2 mg/dL — ABNORMAL HIGH (ref 0.50–0.99)
GFR, Est African American: 54 mL/min — ABNORMAL LOW (ref >=60)
GFR, Est Non African American: 47 mL/min — ABNORMAL LOW (ref >=60)
Glucose, Bld: 86 mg/dL (ref 65–99)
Potassium: 4 mmol/L (ref 3.5–5.3)
Sodium: 140 mmol/L (ref 135–146)

## 2015-07-01 LAB — POCT WET + KOH PREP
Trich by wet prep: ABSENT
Yeast by KOH: ABSENT
Yeast by wet prep: ABSENT

## 2015-07-01 LAB — GLUCOSE, POCT (MANUAL RESULT ENTRY): POC Glucose: 91 mg/dl (ref 70–99)

## 2015-07-01 MED ORDER — MUPIROCIN 2 % EX OINT: 1.0000 "application " | TOPICAL_OINTMENT | Freq: Two times a day (BID) | CUTANEOUS | Status: DC

## 2015-07-01 MED ORDER — CLOTRIMAZOLE 1 % VA CREA: 1.0000 | TOPICAL_CREAM | Freq: Every day | VAGINAL | Status: DC

## 2015-07-01 MED ORDER — FLUCONAZOLE 150 MG PO TABS: 150.0000 mg | ORAL_TABLET | Freq: Once | ORAL | Status: DC

## 2015-07-01 MED ORDER — OMEPRAZOLE 20 MG PO CPDR: 20.0000 mg | DELAYED_RELEASE_CAPSULE | Freq: Every day | ORAL | Status: DC

## 2015-07-01 NOTE — Progress Notes (Addendum)
This chart was scribed for [name], MD by [name], Medical Scribe. This patient was seen in Room 22 and the patient's care was started 11:54 AM.  Chief Complaint:  Chief Complaint  Patient presents with  . Follow-up    needs protonix changed to something cheaper  . Hypertension  . Facial Pain    sore inside nose that wont go away. x 6 months  . Vaginal Itching    HPI: Alyssa Jefferson is a 70 y.o. female who reports to [facility] today for follow up.  She's not feeling well and annoyed due to her vaginal itchiness. She was seen 2 weeks ago in the office by Alveta Heimlich for this issue. She was given medication with some temporary relief but symptoms still persists. When she takes her shower/bath, she would wash around the clitoris, and she would feel the itch there.   She hasn't had GERD medication for a while. When she went to pharmacy, she was declined on the medication.   Pt states having a sore inside her nose that won't go away for past 6 months.   Past Medical History  Diagnosis Date  . High cholesterol   . Hypertension   . Depression   . Cancer [facility]) 2004    breast  . Glaucoma   . Thyroid disease     thyroid removed  . OSA (obstructive sleep apnea)     tried CPAP- but unsuccessful  . Shortness of breath dyspnea   . Hypothyroidism   . GERD (gastroesophageal reflux disease)   . Arthritis     knees, back   Past Surgical History  Procedure Laterality Date  . Breast surgery  2004    left breast removed due to cancer no chemo or radiation  . Abdominal hysterectomy  83    BSO  . Thyroid removed      due to knot  . Knee arthroscopy Left    Social History   Social History  . Marital Status: Widowed    Spouse Name: N/A  . Number of Children: N/A  . Years of Education: N/A   Occupational History  . retired    Social History Main Topics  . Smoking status: Former Smoker -- 20 years    Types: Cigarettes    Quit date: 08/16/1978  . Smokeless  tobacco: Never Used     Comment: 1 pack per week--10/16/12  . Alcohol Use: No  . Drug Use: No  . Sexual Activity: No   Other Topics Concern  . None   Social History Narrative   Family History  Problem Relation Age of Onset  . Lung cancer Father   . Breast cancer Sister   . Ovarian cancer Sister    Allergies  Allergen Reactions  . Codeine Other (See Comments)    confusion  . Lipitor [Atorvastatin Calcium] Itching   Prior to Admission medications   Medication Sig Start Date End Date Taking? Authorizing Provider  albuterol (PROVENTIL) (2.5 MG/3ML) 0.083% nebulizer solution Take 3 mLs (2.5 mg total) by nebulization every 6 (six) hours as needed for wheezing or shortness of breath. 09/19/14   Thao P Le, DO  amLODipine (NORVASC) 2.5 MG tablet Take 1 tablet (2.5 mg total) by mouth daily. 02/06/15   Darlyne Russian, MD  Azelastine HCl 0.15 % SOLN Place 2 sprays into the nose 2 (two) times daily. 05/15/14   Shawnee Knapp, MD  fluconazole (DIFLUCAN) 150 MG tablet Take 1 tablet (150 mg  total) by mouth once. Repeat if needed 06/17/15   Tishira R Brewington, PA-C  fluticasone (FLONASE) 50 MCG/ACT nasal spray Place into both nostrils daily.    Historical Provider, MD  furosemide (LASIX) 20 MG tablet Take one tablet 2 times a week. 12/31/14   Darlyne Russian, MD  latanoprost (XALATAN) 0.005 % ophthalmic solution Place 1 drop into the right eye at bedtime.     Historical Provider, MD  levothyroxine (SYNTHROID) 100 MCG tablet Take 1 tablet (100 mcg total) by mouth daily before breakfast. 02/06/15   Darlyne Russian, MD  LORazepam (ATIVAN) 0.5 MG tablet Take 1 tablet twice a day as needed for stress 03/01/15   Darlyne Russian, MD  losartan (COZAAR) 100 MG tablet Take 1 tablet (100 mg total) by mouth daily. Patient not taking: Reported on 06/17/2015 12/31/14   Darlyne Russian, MD  meloxicam (MOBIC) 15 MG tablet Take 1 tablet (15 mg total) by mouth as needed for pain. Patient not taking: Reported on 06/17/2015 02/06/15    Darlyne Russian, MD  pantoprazole (PROTONIX) 40 MG tablet TAKE 1 TABLET BY MOUTH ONCE DAILY.  "OV NEEDED Patient not taking: Reported on 06/17/2015 04/08/15   Harrison Mons, PA-C  pravastatin (PRAVACHOL) 40 MG tablet Take 1 tablet by mouth daily. 07/14/14   Historical Provider, MD  PRESCRIPTION MEDICATION Ventolin inhaler taking    Historical Provider, MD  PRESCRIPTION MEDICATION Pantoprazole Sod DR 40 mg take one tablet by mouth once daily    Historical Provider, MD  valsartan-hydrochlorothiazide (DIOVAN-HCT) 320-12.5 MG tablet Take 1 tablet by mouth daily.    Historical Provider, MD     ROS:  Constitutional: negative for chills, fever, night sweats, weight changes, or fatigue  HEENT: negative for vision changes, hearing loss, congestion, rhinorrhea, ST, epistaxis, or sinus pressure Cardiovascular: negative for chest pain or palpitations Respiratory: negative for hemoptysis, wheezing, shortness of breath, or cough Abdominal: negative for abdominal pain, nausea, vomiting, diarrhea, or constipation Dermatological: negative for rash Neurologic: negative for headache, dizziness, or syncope GU: positive for vaginal itchiness All other systems reviewed and are otherwise negative with the exception to those above and in the HPI.  PHYSICAL EXAM: Filed Vitals:   07/01/15 1119  BP: 147/74  Pulse: 75  Temp: 98.5 F (36.9 C)  Resp: 16   Body mass index is 44.07 kg/(m^2).   General: Alert, no acute distress HEENT:  Normocephalic, atraumatic, oropharynx patent. Eye: Juliette Mangle [facility] Cardiovascular:  Regular rate and rhythm, no rubs murmurs or gallops.  No Carotid bruits, radial pulse intact. No pedal edema.  Respiratory: Clear to auscultation bilaterally.  No wheezes, rales, or rhonchi.  No cyanosis, no use of accessory musculature Abdominal: No organomegaly, abdomen is soft and non-tender, positive bowel sounds. No masses. Musculoskeletal: Gait intact. No edema, tenderness Skin: No  rashes. Neurologic: Facial musculature symmetric. Psychiatric: Patient acts appropriately throughout our interaction.  Lymphatic: No cervical or submandibular lymphadenopathy Genitourinary/Anorectal: Did not note any redness around clitoris, minimal discharge in vault  LABS:  Results for orders placed or performed in visit on 07/01/15  POCT glucose (manual entry)  Result Value Ref Range   POC Glucose 91 70 - 99 mg/dl  POCT Wet + KOH Prep  Result Value Ref Range   Yeast by KOH Absent Present, Absent   Yeast by wet prep Absent Present, Absent   WBC by wet prep Few None, Few, Too numerous to count   Clue Cells Wet Prep HPF POC None None, Too numerous to  count   Trich by wet prep Absent Present, Absent   Bacteria Wet Prep HPF POC None None, Few, Too numerous to count   Epithelial Cells By [employer] Pref ([facility]) Moderate (A) None, Few, Too numerous to count   RBC,UR,HPF,POC None None RBC/hpf    EKG/XRAY:   Primary read interpreted by Dr. Everlene Farrier at [facility].   ASSESSMENT/PLAN: I gave her some Bactroban to use for her nose. She will treat the external vaginal itching with GYN Lotrimin cream. Her sugar is normal. Her wet prep did not show any hyphae. I suspect her itching is a combination of a low-grade yeast infection and her underlying atrophic vaginitis.I personally performed the services described in this documentation, which was scribed in my presence. The recorded information has been reviewed and is accurate. I also changed her Protonix to omeprazole and see if that has better coverage.  By signing my name below, I, [name], attest that this documentation has been prepared under the direction and in the presence of [name], MD. Electronically Signed: [name], [place]. 07/01/2015 , 12:01 PM .    Gross sideeffects, risk and benefits, and alternatives of medications d/w patient. Patient is aware that all medications have potential sideeffects and we are unable to predict every  sideeffect or drug-drug interaction that may occur.  [name] 07/01/2015 12:01 PM

## 2015-07-02 ENCOUNTER — Telehealth: Payer: Self-pay | Admitting: Emergency Medicine

## 2015-07-02 NOTE — Telephone Encounter (Signed)
Pt called to inquire why Pharmacy did not issue vaginal cream... Informed the cream is not covered by insurance and Pt can consider some OTC alternatives; connected patient to [employer].

## 2015-07-05 ENCOUNTER — Ambulatory Visit: Payer: Self-pay | Admitting: Orthopedic Surgery

## 2015-07-05 NOTE — Progress Notes (Signed)
Preoperative surgical orders have been place into the Epic hospital system for Mervyn Skeeters on 07/05/2015, 4:50 PM  by Mickel Crow for surgery on 07/28/2015.  Preop Total Knee orders including Experal, IV Tylenol, and IV Decadron as long as there are no contraindications to the above medications. Arlee Muslim, PA-C

## 2015-07-07 DIAGNOSIS — I10 Essential (primary) hypertension: Secondary | ICD-10-CM | POA: Diagnosis not present

## 2015-07-21 ENCOUNTER — Encounter (HOSPITAL_COMMUNITY)
Admission: RE | Admit: 2015-07-21 | Discharge: 2015-07-21 | Disposition: A | Discharge status: Home / Self Care | Payer: Medicare Other | Source: Ambulatory Visit | Attending: Orthopedic Surgery | Admitting: Orthopedic Surgery

## 2015-07-21 ENCOUNTER — Encounter (HOSPITAL_COMMUNITY): Payer: Self-pay

## 2015-07-21 DIAGNOSIS — M179 Osteoarthritis of knee, unspecified: Secondary | ICD-10-CM | POA: Insufficient documentation

## 2015-07-21 DIAGNOSIS — Z01818 Encounter for other preprocedural examination: Secondary | ICD-10-CM | POA: Diagnosis not present

## 2015-07-21 HISTORY — DX: Other specified noninflammatory disorders of vagina: N89.8

## 2015-07-21 HISTORY — DX: Nocturia: R35.1

## 2015-07-21 HISTORY — DX: Personal history of other medical treatment: Z92.89

## 2015-07-21 HISTORY — DX: Palpitations: R00.2

## 2015-07-21 LAB — APTT: aPTT: 34 seconds (ref 24–37)

## 2015-07-21 LAB — COMPREHENSIVE METABOLIC PANEL
ALT: 15 U/L (ref 14–54)
AST: 17 U/L (ref 15–41)
Albumin: 3.9 g/dL (ref 3.5–5.0)
Alkaline Phosphatase: 73 U/L (ref 38–126)
Anion gap: 10 (ref 5–15)
BUN: 27 mg/dL — ABNORMAL HIGH (ref 6–20)
CO2: 26 mmol/L (ref 22–32)
Calcium: 9.4 mg/dL (ref 8.9–10.3)
Chloride: 104 mmol/L (ref 101–111)
Creatinine, Ser: 1.31 mg/dL — ABNORMAL HIGH (ref 0.44–1.00)
GFR calc Af Amer: 47 mL/min — ABNORMAL LOW (ref >60)
GFR calc non Af Amer: 41 mL/min — ABNORMAL LOW (ref >60)
Glucose, Bld: 88 mg/dL (ref 65–99)
Potassium: 4.2 mmol/L (ref 3.5–5.1)
Sodium: 140 mmol/L (ref 135–145)
Total Bilirubin: 0.7 mg/dL (ref 0.3–1.2)
Total Protein: 7.8 g/dL (ref 6.5–8.1)

## 2015-07-21 LAB — URINALYSIS, ROUTINE W REFLEX MICROSCOPIC
Bilirubin Urine: NEGATIVE
Glucose, UA: NEGATIVE mg/dL
Hgb urine dipstick: NEGATIVE
Ketones, ur: NEGATIVE mg/dL
Leukocytes, UA: NEGATIVE
Nitrite: NEGATIVE
Protein, ur: NEGATIVE mg/dL
Specific Gravity, Urine: 1.017 (ref 1.005–1.030)
pH: 6 (ref 5.0–8.0)

## 2015-07-21 LAB — ABO/RH: ABO/RH(D): A POS

## 2015-07-21 LAB — CBC
HCT: 34.4 % — ABNORMAL LOW (ref 36.0–46.0)
Hemoglobin: 11 g/dL — ABNORMAL LOW (ref 12.0–15.0)
MCH: 25.6 pg — ABNORMAL LOW (ref 26.0–34.0)
MCHC: 32 g/dL (ref 30.0–36.0)
MCV: 80 fL (ref 78.0–100.0)
Platelets: 319 10*3/uL (ref 150–400)
RBC: 4.3 MIL/uL (ref 3.87–5.11)
RDW: 14.4 % (ref 11.5–15.5)
WBC: 5.5 10*3/uL (ref 4.0–10.5)

## 2015-07-21 LAB — SURGICAL PCR SCREEN
MRSA, PCR: NEGATIVE
Staphylococcus aureus: POSITIVE — AB

## 2015-07-21 LAB — PROTIME-INR
INR: 1.07 (ref 0.00–1.49)
Prothrombin Time: 14.1 seconds (ref 11.6–15.2)

## 2015-07-21 NOTE — Patient Instructions (Signed)
YOUR PROCEDURE IS SCHEDULED ON : 07/28/15  REPORT TO [facility]TRANCE FOLLOW SIGNS TO EAST ELEVATOR - GO TO 3rd FLOOR CHECK IN AT 3 EAST NURSES STATION (SHORT STAY) AT: 9:40 AM  CALL THIS NUMBER IF YOU HAVE PROBLEMS THE MORNING OF SURGERY [phone]  REMEMBER:ONLY 1 PER PERSON MAY GO TO SHORT STAY WITH YOU TO GET READY THE MORNING OF YOUR SURGERY  DO NOT EAT FOOD  AFTER MIDNIGHT  MAY HAVE CLEAR LIQUIDS UNTIL 6:40 AM  TAKE THESE MEDICINES THE MORNING OF SURGERY: LEVOTHYROXINE / [place] Allowed                                                                     Foods Excluded  Coffee and tea, regular and decaf                             liquids that you cannot  Plain Jell-O in any flavor                                             see through such as: Fruit ices (not with fruit pulp)                                     milk, soups, orange juice  Iced Popsicles                                                    All solid food Carbonated beverages, regular and diet                                    Cranberry, grape and apple juices Sports drinks like Gatorade Lightly seasoned clear broth or consume(fat free) Sugar, honey syrup   _____________________________________________________________________    YOU MAY NOT HAVE ANY METAL ON YOUR BODY INCLUDING HAIR PINS AND PIERCING'S. DO NOT WEAR JEWELRY, MAKEUP, LOTIONS, POWDERS OR PERFUMES. DO NOT WEAR NAIL POLISH. DO NOT SHAVE 48 HRS PRIOR TO SURGERY. MEN MAY SHAVE FACE AND NECK.  DO NOT [facility] IS NOT RESPONSIBLE FOR VALUABLES.  CONTACTS, DENTURES OR PARTIALS MAY NOT BE WORN TO SURGERY. LEAVE SUITCASE IN CAR. CAN BE BROUGHT TO ROOM AFTER SURGERY.  PATIENTS DISCHARGED THE DAY OF SURGERY WILL NOT BE ALLOWED TO DRIVE HOME.  PLEASE READ OVER THE FOLLOWING INSTRUCTION  SHEETS _________________________________________________________________________________                                          [place] - PREPARING FOR SURGERY  Before surgery, you can play an important role.  Because skin is  not sterile, your skin needs to be as free of germs as possible.  You can reduce the number of germs on your skin by washing with CHG (chlorahexidine gluconate) soap before surgery.  CHG is an antiseptic cleaner which kills germs and bonds with the skin to continue killing germs even after washing. Please DO NOT use if you have an allergy to CHG or antibacterial soaps.  If your skin becomes reddened/irritated stop using the CHG and inform your nurse when you arrive at Short Stay. Do not shave (including legs and underarms) for at least 48 hours prior to the first CHG shower.  You may shave your face. Please follow these instructions carefully:   1.  Shower with CHG Soap the night before surgery and the  morning of Surgery.   2.  If you choose to wash your hair, wash your hair first as usual with your  normal  Shampoo.   3.  After you shampoo, rinse your hair and body thoroughly to remove the  shampoo.                                         4.  Use CHG as you would any other liquid soap.  You can apply chg directly  to the skin and wash . Gently wash with scrungie or clean wascloth    5.  Apply the CHG Soap to your body ONLY FROM THE NECK DOWN.   Do not use on open                           Wound or open sores. Avoid contact with eyes, ears mouth and genitals (private parts).                        Genitals (private parts) with your normal soap.              6.  Wash thoroughly, paying special attention to the area where your surgery  will be performed.   7.  Thoroughly rinse your body with warm water from the neck down.   8.  DO NOT shower/wash with your normal soap after using and rinsing off  the CHG Soap .                9.  Pat yourself dry with a clean  towel.             10.  Wear clean night clothes to bed after shower             11.  Place clean sheets on your bed the night of your first shower and do not  sleep with pets.  Day of Surgery : Do not apply any lotions/deodorants the morning of surgery.  Please wear clean clothes to the hospital/surgery center.  FAILURE TO FOLLOW THESE INSTRUCTIONS MAY RESULT IN THE CANCELLATION OF YOUR SURGERY    PATIENT SIGNATURE_________________________________  ______________________________________________________________________     Adam Phenix  An incentive spirometer is a tool that can help keep your lungs clear and active. This tool measures how well you are filling your lungs with each breath. Taking long deep breaths may help reverse or decrease the chance of developing breathing (pulmonary) problems (especially infection) following:  A long period of time when you are unable to move or be active.  BEFORE THE PROCEDURE   If the spirometer includes an indicator to show your best effort, your nurse or respiratory therapist will set it to a desired goal.  If possible, sit up straight or lean slightly forward. Try not to slouch.  Hold the incentive spirometer in an upright position. INSTRUCTIONS FOR USE   Sit on the edge of your bed if possible, or sit up as far as you can in bed or on a chair.  Hold the incentive spirometer in an upright position.  Breathe out normally.  Place the mouthpiece in your mouth and seal your lips tightly around it.  Breathe in slowly and as deeply as possible, raising the piston or the ball toward the top of the column.  Hold your breath for 3-5 seconds or for as long as possible. Allow the piston or ball to fall to the bottom of the column.  Remove the mouthpiece from your mouth and breathe out normally.  Rest for a few seconds and repeat Steps 1 through 7 at least 10 times every 1-2 hours when you are awake. Take your time and take a few  normal breaths between deep breaths.  The spirometer may include an indicator to show your best effort. Use the indicator as a goal to work toward during each repetition.  After each set of 10 deep breaths, practice coughing to be sure your lungs are clear. If you have an incision (the cut made at the time of surgery), support your incision when coughing by placing a pillow or rolled up towels firmly against it. Once you are able to get out of bed, walk around indoors and cough well. You may stop using the incentive spirometer when instructed by your caregiver.  RISKS AND COMPLICATIONS  Take your time so you do not get dizzy or light-headed.  If you are in pain, you may need to take or ask for pain medication before doing incentive spirometry. It is harder to take a deep breath if you are having pain. AFTER USE  Rest and breathe slowly and easily.  It can be helpful to keep track of a log of your progress. Your caregiver can provide you with a simple table to help with this. If you are using the spirometer at home, follow these instructions: [place] IF:   You are having difficultly using the spirometer.  You have trouble using the spirometer as often as instructed.  Your pain medication is not giving enough relief while using the spirometer.  You develop fever of 100.5 F (38.1 C) or higher. SEEK IMMEDIATE MEDICAL CARE IF:   You cough up bloody sputum that had not been present before.  You develop fever of 102 F (38.9 C) or greater.  You develop worsening pain at or near the incision site. MAKE SURE YOU:   Understand these instructions.  Will watch your condition.  Will get help right away if you are not doing well or get worse. Document Released: 12/13/2006 Document Revised: 10/25/2011 Document Reviewed: 02/13/2007 Exit[employer] Patient Information 2014 [employer], [place].   ________________________________________________________________________  WHAT IS A BLOOD  TRANSFUSION? Blood Transfusion Information  A transfusion is the replacement of blood or some of its parts. Blood is made up of multiple cells which provide different functions.  Red blood cells carry oxygen and are used for blood loss replacement.  White blood cells fight against infection.  Platelets control bleeding.  Plasma helps clot blood.  Other blood products are available for specialized needs, such  as hemophilia or other clotting disorders. BEFORE THE TRANSFUSION  Who gives blood for transfusions?   Healthy volunteers who are fully evaluated to make sure their blood is safe. This is blood bank blood. Transfusion therapy is the safest it has ever been in the practice of medicine. Before blood is taken from a donor, a complete history is taken to make sure that person has no history of diseases nor engages in risky social behavior (examples are intravenous drug use or sexual activity with multiple partners). The donor's travel history is screened to minimize risk of transmitting infections, such as malaria. The donated blood is tested for signs of infectious diseases, such as HIV and hepatitis. The blood is then tested to be sure it is compatible with you in order to minimize the chance of a transfusion reaction. If you or a relative donates blood, this is often done in anticipation of surgery and is not appropriate for emergency situations. It takes many days to process the donated blood. RISKS AND COMPLICATIONS Although transfusion therapy is very safe and saves many lives, the main dangers of transfusion include:   Getting an infectious disease.  Developing a transfusion reaction. This is an allergic reaction to something in the blood you were given. Every precaution is taken to prevent this. The decision to have a blood transfusion has been considered carefully by your caregiver before blood is given. Blood is not given unless the benefits outweigh the risks. AFTER THE  TRANSFUSION  Right after receiving a blood transfusion, you will usually feel much better and more energetic. This is especially true if your red blood cells have gotten low (anemic). The transfusion raises the level of the red blood cells which carry oxygen, and this usually causes an energy increase.  The nurse administering the transfusion will monitor you carefully for complications. HOME CARE INSTRUCTIONS  No special instructions are needed after a transfusion. You may find your energy is better. Speak with your caregiver about any limitations on activity for underlying diseases you may have. SEEK MEDICAL CARE IF:   Your condition is not improving after your transfusion.  You develop redness or irritation at the intravenous (IV) site. SEEK IMMEDIATE MEDICAL CARE IF:  Any of the following symptoms occur over the next 12 hours:  Shaking chills.  You have a temperature by mouth above 102 F (38.9 C), not controlled by medicine.  Chest, back, or muscle pain.  People around you feel you are not acting correctly or are confused.  Shortness of breath or difficulty breathing.  Dizziness and fainting.  You get a rash or develop hives.  You have a decrease in urine output.  Your urine turns a dark color or changes to pink, red, or brown. Any of the following symptoms occur over the next 10 days:  You have a temperature by mouth above 102 F (38.9 C), not controlled by medicine.  Shortness of breath.  Weakness after normal activity.  The white part of the eye turns yellow (jaundice).  You have a decrease in the amount of urine or are urinating less often.  Your urine turns a dark color or changes to pink, red, or brown. Document Released: 07/30/2000 Document Revised: 10/25/2011 Document Reviewed: 03/18/2008 [facility] Patient Information 2014 [place], [place].  _______________________________________________________________________

## 2015-07-24 NOTE — H&P (Signed)
TOTAL KNEE ADMISSION H&P  Patient is being admitted for left total knee arthroplasty.  Subjective:  Chief Complaint:left knee pain.  HPI: Alyssa Jefferson, 69 y.o. female, has a history of pain and functional disability in the left knee due to trauma and arthritis and has failed non-surgical conservative treatments for greater than 12 weeks to includeNSAID's and/or analgesics, corticosteriod injections, flexibility and strengthening excercises and activity modification.  Onset of symptoms was gradual, starting 2 years ago with gradually worsening course since that time. The patient noted prior procedures on the knee to include  arthroscopy and menisectomy on the left knee(s).  Patient currently rates pain in the left knee(s) at 8 out of 10 with activity. Patient has night pain, worsening of pain with activity and weight bearing, pain that interferes with activities of daily living, pain with passive range of motion, crepitus and joint swelling.  Patient has evidence of periarticular osteophytes and joint space narrowing by imaging studies. There is no active infection.  Patient Active Problem List   Diagnosis Date Noted  . Palpitations 10/17/2014  . OSA (obstructive sleep apnea) 10/16/2012  . BMI 40.0-44.9, adult ([place]) 12/20/2011  . Hypothyroid 12/20/2011  . 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 Osteoarthritis, knee 12/20/2011  . Other and unspecified hyperlipidemia 12/20/2011  . Depressive disorder, not elsewhere classified 12/20/2011  . Unspecified essential hypertension 12/20/2011  . Anemia 12/20/2011  . Cancer [facility])    Past Medical History  Diagnosis Date  . High cholesterol   . Hypertension   . Depression   . Cancer [facility]) 2004    breast  . Glaucoma   . Thyroid disease     thyroid removed  . Hypothyroidism   . GERD (gastroesophageal reflux disease)   . Arthritis     knees, back  . Heart palpitations   . Nocturia   . History of transfusion     many years ago  . OSA (obstructive sleep apnea)     tried CPAP- but unsuccessful  . Itching in the vaginal area     Past Surgical History  Procedure Laterality Date  . Abdominal hysterectomy  83    BSO  . Thyroid removed      due to knot  . Knee arthroscopy Left   . Breast surgery  2004    left breast removed due to cancer no chemo or radiation  . Mastectomy      LEFT      Current outpatient prescriptions:  .  albuterol (PROVENTIL HFA;VENTOLIN HFA) 108 (90 BASE) MCG/ACT inhaler, Inhale 2 puffs into the lungs every 6 (six) hours as needed for wheezing or shortness of breath., Disp: , Rfl:  .  fluticasone (FLONASE) 50 MCG/ACT nasal spray, Place 1 spray into both nostrils daily as needed for allergies. , Disp: , Rfl:  .  latanoprost (XALATAN) 0.005 % ophthalmic solution, Place 1 drop into the right eye at bedtime. , Disp: , Rfl:  .  levothyroxine (SYNTHROID) 100 MCG tablet, Take 1 tablet (100 mcg total) by mouth daily before breakfast., Disp: 30 tablet, Rfl: 4 .  omeprazole (PRILOSEC) 20 MG capsule, Take 1 capsule (20 mg total) by mouth daily., Disp: 30 capsule, Rfl: 3 .  rosuvastatin (CRESTOR) 10 MG tablet, Take 10 mg by mouth at bedtime., Disp: , Rfl:  .  valsartan-hydrochlorothiazide (DIOVAN-HCT) 320-12.5 MG tablet, Take 1 tablet by mouth daily., Disp: , Rfl:    Allergies  Allergen Reactions  . Codeine Other (See Comments)  confusion  . Lipitor [Atorvastatin Calcium] Itching    Social History  Substance Use Topics  . Smoking status: Former Smoker -- 20 years    Types: Cigarettes    Quit date: 08/16/1978  . Smokeless tobacco: Never Used     Comment: 1 pack per week--10/16/12  . Alcohol Use: No    Family History  Problem Relation Age of Onset  . Lung cancer Father   . Breast cancer Sister   . Ovarian cancer Sister      Review of Systems  Constitutional: Negative.   HENT: Negative.   Eyes: Negative.   Respiratory: Negative.   Cardiovascular: Positive for  palpitations. Negative for chest pain, orthopnea, claudication, leg swelling and PND.  Gastrointestinal: Negative.   Genitourinary: Negative.   Musculoskeletal: Positive for joint pain. Negative for myalgias, back pain, falls and neck pain.       Left knee pain  Skin: Positive for itching. Negative for rash.  Neurological: Negative.   Endo/Heme/Allergies: Negative.   Psychiatric/Behavioral: Negative.     Objective:  Physical Exam  Constitutional: She is oriented to person, place, and time. She appears well-developed. No distress.  Morbidly obese  HENT:  Head: Normocephalic and atraumatic.  Right Ear: External ear normal.  Left Ear: External ear normal.  Nose: Nose normal.  Mouth/Throat: Oropharynx is clear and moist.  Eyes: Conjunctivae and EOM are normal.  Neck: Normal range of motion. Neck supple.  Cardiovascular: Normal rate, regular rhythm, normal heart sounds and intact distal pulses.   No murmur heard. Respiratory: Effort normal and breath sounds normal. No respiratory distress. She has no wheezes.  GI: Soft. Bowel sounds are normal. She exhibits no distension. There is no tenderness.  Musculoskeletal:       Right hip: Normal.       Left hip: Normal.       Right knee: Normal.       Left knee: She exhibits decreased range of motion and swelling. She exhibits no effusion and no erythema. Tenderness found. Medial joint line and lateral joint line tenderness noted.  Her left hip shows normal range of motion and no discomfort. Her left knee shows no effusion. There is marked crepitus on range of motion, the knee range about 10 to 120. There is no instability noted. Right knee, no effusion, range about 0 to about 115 or 120. No tenderness noted.  Neurological: She is alert and oriented to person, place, and time. She has normal strength and normal reflexes. No sensory deficit.  Skin: No rash noted. She is not diaphoretic. No erythema.  Psychiatric: She has a normal mood and  affect. Her behavior is normal.    Vitals  Weight: 236 lb Height: 62in Body Surface Area: 2.05 m Body Mass Index: 43.16 kg/m  Pulse: 76 (Regular)  BP: 124/76 (Sitting, Left Arm, Standard)  Imaging Review Plain radiographs demonstrate severe degenerative joint disease of the left knee(s). The overall alignment ismild varus. The bone quality appears to be good for age and reported activity level.  Assessment/Plan:  End stage primary osteoarthritis, left knee   The patient history, physical examination, clinical judgment of the provider and imaging studies are consistent with end stage degenerative joint disease of the left knee(s) and total knee arthroplasty is deemed medically necessary. The treatment options including medical management, injection therapy arthroscopy and arthroplasty were discussed at length. The risks and benefits of total knee arthroplasty were presented and reviewed. The risks due to aseptic loosening, infection, stiffness, patella tracking  problems, thromboembolic complications and other imponderables were discussed. The patient acknowledged the explanation, agreed to proceed with the plan and consent was signed. Patient is being admitted for inpatient treatment for surgery, pain control, PT, OT, prophylactic antibiotics, VTE prophylaxis, progressive ambulation and ADL's and discharge planning. The patient is planning to be discharged home with home health services     PCP: Dr. Ivar Bury  No BP/IV in left arm  Ardeen Jourdain, [place]

## 2015-07-25 DIAGNOSIS — Z01818 Encounter for other preprocedural examination: Secondary | ICD-10-CM | POA: Diagnosis not present

## 2015-07-25 DIAGNOSIS — G4733 Obstructive sleep apnea (adult) (pediatric): Secondary | ICD-10-CM | POA: Diagnosis not present

## 2015-07-25 DIAGNOSIS — I10 Essential (primary) hypertension: Secondary | ICD-10-CM | POA: Diagnosis not present

## 2015-07-25 DIAGNOSIS — R002 Palpitations: Secondary | ICD-10-CM | POA: Diagnosis not present

## 2015-07-28 ENCOUNTER — Inpatient Hospital Stay (HOSPITAL_COMMUNITY): Payer: Medicare Other | Admitting: Certified Registered Nurse Anesthetist

## 2015-07-28 ENCOUNTER — Inpatient Hospital Stay (HOSPITAL_COMMUNITY)
Admission: RE | Admit: 2015-07-28 | Discharge: 2015-07-30 | DRG: 470 | Disposition: A | Discharge status: Skilled Nursing Facility | Payer: Medicare Other | Source: Ambulatory Visit | Attending: Orthopedic Surgery | Admitting: Orthopedic Surgery

## 2015-07-28 ENCOUNTER — Encounter (HOSPITAL_COMMUNITY)
Admission: RE | Disposition: A | Discharge status: Skilled Nursing Facility | Payer: Self-pay | Source: Ambulatory Visit | Attending: Orthopedic Surgery

## 2015-07-28 ENCOUNTER — Encounter (HOSPITAL_COMMUNITY): Payer: Self-pay | Admitting: *Deleted

## 2015-07-28 DIAGNOSIS — Z86718 Personal history of other venous thrombosis and embolism: Secondary | ICD-10-CM | POA: Diagnosis not present

## 2015-07-28 DIAGNOSIS — N289 Disorder of kidney and ureter, unspecified: Secondary | ICD-10-CM | POA: Diagnosis not present

## 2015-07-28 DIAGNOSIS — I739 Peripheral vascular disease, unspecified: Secondary | ICD-10-CM | POA: Diagnosis present

## 2015-07-28 DIAGNOSIS — G4733 Obstructive sleep apnea (adult) (pediatric): Secondary | ICD-10-CM | POA: Diagnosis not present

## 2015-07-28 DIAGNOSIS — Z6841 Body Mass Index (BMI) 40.0 and over, adult: Secondary | ICD-10-CM | POA: Diagnosis not present

## 2015-07-28 DIAGNOSIS — H409 Unspecified glaucoma: Secondary | ICD-10-CM | POA: Diagnosis not present

## 2015-07-28 DIAGNOSIS — I10 Essential (primary) hypertension: Secondary | ICD-10-CM | POA: Diagnosis not present

## 2015-07-28 DIAGNOSIS — Z96652 Presence of left artificial knee joint: Secondary | ICD-10-CM | POA: Diagnosis not present

## 2015-07-28 DIAGNOSIS — E785 Hyperlipidemia, unspecified: Secondary | ICD-10-CM | POA: Diagnosis not present

## 2015-07-28 DIAGNOSIS — R262 Difficulty in walking, not elsewhere classified: Secondary | ICD-10-CM | POA: Diagnosis not present

## 2015-07-28 DIAGNOSIS — Z79899 Other long term (current) drug therapy: Secondary | ICD-10-CM | POA: Diagnosis not present

## 2015-07-28 DIAGNOSIS — Z01812 Encounter for preprocedural laboratory examination: Secondary | ICD-10-CM | POA: Diagnosis not present

## 2015-07-28 DIAGNOSIS — R278 Other lack of coordination: Secondary | ICD-10-CM | POA: Diagnosis not present

## 2015-07-28 DIAGNOSIS — E890 Postprocedural hypothyroidism: Secondary | ICD-10-CM | POA: Diagnosis present

## 2015-07-28 DIAGNOSIS — Z4789 Encounter for other orthopedic aftercare: Secondary | ICD-10-CM | POA: Diagnosis not present

## 2015-07-28 DIAGNOSIS — M25562 Pain in left knee: Secondary | ICD-10-CM | POA: Diagnosis not present

## 2015-07-28 DIAGNOSIS — F329 Major depressive disorder, single episode, unspecified: Secondary | ICD-10-CM | POA: Diagnosis present

## 2015-07-28 DIAGNOSIS — Z87891 Personal history of nicotine dependence: Secondary | ICD-10-CM | POA: Diagnosis not present

## 2015-07-28 DIAGNOSIS — M6281 Muscle weakness (generalized): Secondary | ICD-10-CM | POA: Diagnosis not present

## 2015-07-28 DIAGNOSIS — M179 Osteoarthritis of knee, unspecified: Secondary | ICD-10-CM | POA: Diagnosis present

## 2015-07-28 DIAGNOSIS — K219 Gastro-esophageal reflux disease without esophagitis: Secondary | ICD-10-CM | POA: Diagnosis not present

## 2015-07-28 DIAGNOSIS — M1712 Unilateral primary osteoarthritis, left knee: Principal | ICD-10-CM | POA: Diagnosis present

## 2015-07-28 DIAGNOSIS — M1710 Unilateral primary osteoarthritis, unspecified knee: Secondary | ICD-10-CM | POA: Diagnosis present

## 2015-07-28 HISTORY — PX: TOTAL KNEE ARTHROPLASTY: SHX125

## 2015-07-28 LAB — TYPE AND SCREEN
ABO/RH(D): A POS
Antibody Screen: NEGATIVE

## 2015-07-28 SURGERY — ARTHROPLASTY, KNEE, TOTAL
Anesthesia: Spinal | Site: Hip | Laterality: Left

## 2015-07-28 MED ORDER — DEXTROSE-NACL 5-0.9 % IV SOLN
INTRAVENOUS | Status: DC
  Administered 2015-07-28 – 2015-07-29 (×2): via INTRAVENOUS

## 2015-07-28 MED ORDER — ACETAMINOPHEN 325 MG PO TABS: 650.0000 mg | ORAL_TABLET | Freq: Four times a day (QID) | ORAL | Status: DC | PRN

## 2015-07-28 MED ORDER — PROPOFOL 10 MG/ML IV BOLUS
INTRAVENOUS | Status: DC | PRN
  Administered 2015-07-28: 10 mg via INTRAVENOUS
  Administered 2015-07-28: 20 mg via INTRAVENOUS
  Administered 2015-07-28: 10 mg via INTRAVENOUS

## 2015-07-28 MED ORDER — HYDROCHLOROTHIAZIDE 12.5 MG PO CAPS
12.5000 mg | ORAL_CAPSULE | Freq: Every day | ORAL | Status: DC
  Administered 2015-07-29 – 2015-07-30 (×2): 12.5 mg via ORAL
  Filled 2015-07-28 (×2): qty 1

## 2015-07-28 MED ORDER — CEFAZOLIN SODIUM-DEXTROSE 2-3 GM-% IV SOLR
2.0000 g | INTRAVENOUS | Status: AC
  Administered 2015-07-28: 2 g via INTRAVENOUS

## 2015-07-28 MED ORDER — METOCLOPRAMIDE HCL 5 MG/ML IJ SOLN: 5.0000 mg | Freq: Three times a day (TID) | INTRAMUSCULAR | Status: DC | PRN

## 2015-07-28 MED ORDER — DEXAMETHASONE SODIUM PHOSPHATE 10 MG/ML IJ SOLN
INTRAMUSCULAR | Status: AC
  Filled 2015-07-28: qty 1

## 2015-07-28 MED ORDER — METHOCARBAMOL 500 MG PO TABS
500.0000 mg | ORAL_TABLET | Freq: Four times a day (QID) | ORAL | Status: DC | PRN
  Administered 2015-07-30: 500 mg via ORAL
  Filled 2015-07-28 (×2): qty 1

## 2015-07-28 MED ORDER — BUPIVACAINE HCL 0.25 % IJ SOLN
INTRAMUSCULAR | Status: DC | PRN
  Administered 2015-07-28: 20 mL

## 2015-07-28 MED ORDER — ONDANSETRON HCL 4 MG/2ML IJ SOLN
4.0000 mg | Freq: Four times a day (QID) | INTRAMUSCULAR | Status: DC | PRN
  Administered 2015-07-29: 4 mg via INTRAVENOUS
  Filled 2015-07-28: qty 2

## 2015-07-28 MED ORDER — SODIUM CHLORIDE 0.9 % IV SOLN: INTRAVENOUS | Status: DC

## 2015-07-28 MED ORDER — ALBUTEROL SULFATE (2.5 MG/3ML) 0.083% IN NEBU: 2.5000 mg | INHALATION_SOLUTION | Freq: Four times a day (QID) | RESPIRATORY_TRACT | Status: DC | PRN

## 2015-07-28 MED ORDER — DEXAMETHASONE SODIUM PHOSPHATE 10 MG/ML IJ SOLN
10.0000 mg | Freq: Once | INTRAMUSCULAR | Status: AC
  Administered 2015-07-29: 10 mg via INTRAVENOUS
  Filled 2015-07-28: qty 1

## 2015-07-28 MED ORDER — FLUTICASONE PROPIONATE 50 MCG/ACT NA SUSP
1.0000 | Freq: Every day | NASAL | Status: DC | PRN
  Filled 2015-07-28: qty 16

## 2015-07-28 MED ORDER — FENTANYL CITRATE (PF) 100 MCG/2ML IJ SOLN: 25.0000 ug | INTRAMUSCULAR | Status: DC | PRN

## 2015-07-28 MED ORDER — ROSUVASTATIN CALCIUM 10 MG PO TABS
10.0000 mg | ORAL_TABLET | Freq: Every day | ORAL | Status: DC
Start: 2015-07-28 — End: 2015-07-30
  Administered 2015-07-28 – 2015-07-29 (×2): 10 mg via ORAL
  Filled 2015-07-28 (×3): qty 1

## 2015-07-28 MED ORDER — MIDAZOLAM HCL 2 MG/2ML IJ SOLN
INTRAMUSCULAR | Status: AC
  Filled 2015-07-28: qty 2

## 2015-07-28 MED ORDER — APIXABAN 2.5 MG PO TABS
2.5000 mg | ORAL_TABLET | Freq: Two times a day (BID) | ORAL | Status: DC
  Administered 2015-07-29 – 2015-07-30 (×3): 2.5 mg via ORAL
  Filled 2015-07-28 (×5): qty 1

## 2015-07-28 MED ORDER — PROPOFOL 10 MG/ML IV BOLUS
INTRAVENOUS | Status: AC
  Filled 2015-07-28: qty 60

## 2015-07-28 MED ORDER — MIDAZOLAM HCL 5 MG/5ML IJ SOLN
INTRAMUSCULAR | Status: DC | PRN
  Administered 2015-07-28: 2 mg via INTRAVENOUS

## 2015-07-28 MED ORDER — METHOCARBAMOL 1000 MG/10ML IJ SOLN
500.0000 mg | Freq: Four times a day (QID) | INTRAVENOUS | Status: DC | PRN
  Administered 2015-07-28: 500 mg via INTRAVENOUS
  Filled 2015-07-28 (×2): qty 5

## 2015-07-28 MED ORDER — CEFAZOLIN SODIUM-DEXTROSE 2-3 GM-% IV SOLR
INTRAVENOUS | Status: AC
  Filled 2015-07-28: qty 50

## 2015-07-28 MED ORDER — IRBESARTAN 300 MG PO TABS
300.0000 mg | ORAL_TABLET | Freq: Every day | ORAL | Status: DC
Start: 2015-07-29 — End: 2015-07-30
  Administered 2015-07-29 – 2015-07-30 (×2): 300 mg via ORAL
  Filled 2015-07-28 (×2): qty 1

## 2015-07-28 MED ORDER — VALSARTAN-HYDROCHLOROTHIAZIDE 320-12.5 MG PO TABS: 1.0000 | ORAL_TABLET | Freq: Every day | ORAL | Status: DC

## 2015-07-28 MED ORDER — CHLORHEXIDINE GLUCONATE 4 % EX LIQD: 60.0000 mL | Freq: Once | CUTANEOUS | Status: DC

## 2015-07-28 MED ORDER — PANTOPRAZOLE SODIUM 40 MG PO TBEC
40.0000 mg | DELAYED_RELEASE_TABLET | Freq: Every day | ORAL | Status: DC
  Filled 2015-07-28: qty 1

## 2015-07-28 MED ORDER — ONDANSETRON HCL 4 MG/2ML IJ SOLN
INTRAMUSCULAR | Status: AC
  Filled 2015-07-28: qty 2

## 2015-07-28 MED ORDER — ALBUTEROL SULFATE HFA 108 (90 BASE) MCG/ACT IN AERS: 2.0000 | INHALATION_SPRAY | Freq: Four times a day (QID) | RESPIRATORY_TRACT | Status: DC | PRN

## 2015-07-28 MED ORDER — LEVOTHYROXINE SODIUM 100 MCG PO TABS
100.0000 ug | ORAL_TABLET | Freq: Every day | ORAL | Status: DC
  Administered 2015-07-29 – 2015-07-30 (×2): 100 ug via ORAL
  Filled 2015-07-28 (×3): qty 1

## 2015-07-28 MED ORDER — FLEET ENEMA 7-19 GM/118ML RE ENEM: 1.0000 | ENEMA | Freq: Once | RECTAL | Status: DC | PRN

## 2015-07-28 MED ORDER — BUPIVACAINE HCL (PF) 0.5 % IJ SOLN
INTRAMUSCULAR | Status: AC
  Filled 2015-07-28: qty 30

## 2015-07-28 MED ORDER — POLYETHYLENE GLYCOL 3350 17 G PO PACK: 17.0000 g | PACK | Freq: Every day | ORAL | Status: DC | PRN

## 2015-07-28 MED ORDER — OXYCODONE HCL 5 MG PO TABS
5.0000 mg | ORAL_TABLET | ORAL | Status: DC | PRN
  Administered 2015-07-28: 10 mg via ORAL
  Administered 2015-07-28: 5 mg via ORAL
  Administered 2015-07-28 – 2015-07-30 (×6): 10 mg via ORAL
  Filled 2015-07-28: qty 1
  Filled 2015-07-28 (×7): qty 2

## 2015-07-28 MED ORDER — DOCUSATE SODIUM 100 MG PO CAPS
100.0000 mg | ORAL_CAPSULE | Freq: Two times a day (BID) | ORAL | Status: DC
  Administered 2015-07-28 – 2015-07-30 (×4): 100 mg via ORAL

## 2015-07-28 MED ORDER — PROMETHAZINE HCL 25 MG/ML IJ SOLN: 6.2500 mg | INTRAMUSCULAR | Status: DC | PRN

## 2015-07-28 MED ORDER — FENTANYL CITRATE (PF) 100 MCG/2ML IJ SOLN
INTRAMUSCULAR | Status: AC
  Filled 2015-07-28: qty 2

## 2015-07-28 MED ORDER — METOCLOPRAMIDE HCL 10 MG PO TABS: 5.0000 mg | ORAL_TABLET | Freq: Three times a day (TID) | ORAL | Status: DC | PRN

## 2015-07-28 MED ORDER — PHENYLEPHRINE HCL 10 MG/ML IJ SOLN
INTRAMUSCULAR | Status: DC | PRN
  Administered 2015-07-28 (×6): 80 ug via INTRAVENOUS

## 2015-07-28 MED ORDER — CEFAZOLIN SODIUM-DEXTROSE 2-3 GM-% IV SOLR
2.0000 g | Freq: Four times a day (QID) | INTRAVENOUS | Status: AC
  Administered 2015-07-28 – 2015-07-29 (×2): 2 g via INTRAVENOUS
  Filled 2015-07-28 (×2): qty 50

## 2015-07-28 MED ORDER — ACETAMINOPHEN 500 MG PO TABS
1000.0000 mg | ORAL_TABLET | Freq: Four times a day (QID) | ORAL | Status: AC
  Administered 2015-07-28 – 2015-07-29 (×3): 1000 mg via ORAL
  Filled 2015-07-28 (×4): qty 2

## 2015-07-28 MED ORDER — BUPIVACAINE LIPOSOME 1.3 % IJ SUSP
20.0000 mL | Freq: Once | INTRAMUSCULAR | Status: DC
  Filled 2015-07-28: qty 20

## 2015-07-28 MED ORDER — PHENOL 1.4 % MT LIQD: 1.0000 | OROMUCOSAL | Status: DC | PRN

## 2015-07-28 MED ORDER — BUPIVACAINE HCL (PF) 0.25 % IJ SOLN
INTRAMUSCULAR | Status: AC
  Filled 2015-07-28: qty 30

## 2015-07-28 MED ORDER — PHENYLEPHRINE 40 MCG/ML (10ML) SYRINGE FOR IV PUSH (FOR BLOOD PRESSURE SUPPORT)
PREFILLED_SYRINGE | INTRAVENOUS | Status: AC
  Filled 2015-07-28: qty 10

## 2015-07-28 MED ORDER — SODIUM CHLORIDE 0.9 % IJ SOLN
INTRAMUSCULAR | Status: AC
  Filled 2015-07-28: qty 50

## 2015-07-28 MED ORDER — DEXAMETHASONE SODIUM PHOSPHATE 10 MG/ML IJ SOLN
10.0000 mg | Freq: Once | INTRAMUSCULAR | Status: AC
  Administered 2015-07-28: 10 mg via INTRAVENOUS

## 2015-07-28 MED ORDER — ACETAMINOPHEN 10 MG/ML IV SOLN
INTRAVENOUS | Status: AC
  Filled 2015-07-28: qty 100

## 2015-07-28 MED ORDER — MEPERIDINE HCL 50 MG/ML IJ SOLN
6.2500 mg | INTRAMUSCULAR | Status: DC | PRN
Start: 2015-07-28 — End: 2015-07-28

## 2015-07-28 MED ORDER — BUPIVACAINE IN DEXTROSE 0.75-8.25 % IT SOLN
INTRATHECAL | Status: DC | PRN
  Administered 2015-07-28: 1.8 mL via INTRATHECAL

## 2015-07-28 MED ORDER — ACETAMINOPHEN 10 MG/ML IV SOLN
1000.0000 mg | Freq: Once | INTRAVENOUS | Status: AC
  Administered 2015-07-28: 1000 mg via INTRAVENOUS
  Filled 2015-07-28: qty 100

## 2015-07-28 MED ORDER — SODIUM CHLORIDE 0.9 % IJ SOLN
INTRAMUSCULAR | Status: DC | PRN
  Administered 2015-07-28: 30 mL

## 2015-07-28 MED ORDER — PROPOFOL 500 MG/50ML IV EMUL
INTRAVENOUS | Status: DC | PRN
  Administered 2015-07-28: 40 ug/kg/min via INTRAVENOUS

## 2015-07-28 MED ORDER — ONDANSETRON HCL 4 MG/2ML IJ SOLN
INTRAMUSCULAR | Status: DC | PRN
  Administered 2015-07-28: 4 mg via INTRAVENOUS

## 2015-07-28 MED ORDER — ONDANSETRON HCL 4 MG PO TABS: 4.0000 mg | ORAL_TABLET | Freq: Four times a day (QID) | ORAL | Status: DC | PRN

## 2015-07-28 MED ORDER — DIPHENHYDRAMINE HCL 12.5 MG/5ML PO ELIX: 12.5000 mg | ORAL_SOLUTION | ORAL | Status: DC | PRN

## 2015-07-28 MED ORDER — PHENYLEPHRINE 40 MCG/ML (10ML) SYRINGE FOR IV PUSH (FOR BLOOD PRESSURE SUPPORT)
PREFILLED_SYRINGE | INTRAVENOUS | Status: AC
  Filled 2015-07-28: qty 20

## 2015-07-28 MED ORDER — TRAMADOL HCL 50 MG PO TABS: 50.0000 mg | ORAL_TABLET | Freq: Four times a day (QID) | ORAL | Status: DC | PRN

## 2015-07-28 MED ORDER — ACETAMINOPHEN 650 MG RE SUPP: 650.0000 mg | Freq: Four times a day (QID) | RECTAL | Status: DC | PRN

## 2015-07-28 MED ORDER — LATANOPROST 0.005 % OP SOLN
1.0000 [drp] | Freq: Every day | OPHTHALMIC | Status: DC
  Administered 2015-07-28 – 2015-07-29 (×2): 1 [drp] via OPHTHALMIC
  Filled 2015-07-28: qty 2.5

## 2015-07-28 MED ORDER — MENTHOL 3 MG MT LOZG: 1.0000 | LOZENGE | OROMUCOSAL | Status: DC | PRN

## 2015-07-28 MED ORDER — LACTATED RINGERS IV SOLN
INTRAVENOUS | Status: DC | PRN
  Administered 2015-07-28 (×2): via INTRAVENOUS

## 2015-07-28 MED ORDER — MORPHINE SULFATE (PF) 2 MG/ML IV SOLN
1.0000 mg | INTRAVENOUS | Status: DC | PRN
  Administered 2015-07-28: 1 mg via INTRAVENOUS
  Filled 2015-07-28 (×2): qty 1

## 2015-07-28 MED ORDER — BUPIVACAINE LIPOSOME 1.3 % IJ SUSP
INTRAMUSCULAR | Status: DC | PRN
  Administered 2015-07-28: 20 mL

## 2015-07-28 MED ORDER — LACTATED RINGERS IV SOLN: INTRAVENOUS | Status: DC

## 2015-07-28 MED ORDER — FENTANYL CITRATE (PF) 100 MCG/2ML IJ SOLN
INTRAMUSCULAR | Status: DC | PRN
  Administered 2015-07-28 (×2): 50 ug via INTRAVENOUS

## 2015-07-28 MED ORDER — BISACODYL 10 MG RE SUPP: 10.0000 mg | Freq: Every day | RECTAL | Status: DC | PRN

## 2015-07-28 MED ORDER — TRANEXAMIC ACID 1000 MG/10ML IV SOLN
1000.0000 mg | INTRAVENOUS | Status: AC
  Administered 2015-07-28: 1000 mg via INTRAVENOUS
  Filled 2015-07-28: qty 10

## 2015-07-28 SURGICAL SUPPLY — 51 items
BAG DECANTER FOR FLEXI CONT (MISCELLANEOUS) ×2 IMPLANT
BAG SPEC THK2 15X12 ZIP CLS (MISCELLANEOUS) ×1
BAG ZIPLOCK 12X15 (MISCELLANEOUS) ×2 IMPLANT
BANDAGE ELASTIC 6 VELCRO ST LF (GAUZE/BANDAGES/DRESSINGS) ×2 IMPLANT
BLADE SAG 18X100X1.27 (BLADE) ×2 IMPLANT
BLADE SAW SGTL 11.0X1.19X90.0M (BLADE) ×2 IMPLANT
BOWL SMART MIX CTS (DISPOSABLE) ×2 IMPLANT
CAPT KNEE TOTAL 3 ATTUNE ×1 IMPLANT
CEMENT HV SMART SET (Cement) ×3 IMPLANT
CLOTH BEACON ORANGE TIMEOUT ST (SAFETY) ×2 IMPLANT
CUFF TOURN SGL QUICK 34 (TOURNIQUET CUFF) ×2
CUFF TRNQT CYL 34X4X40X1 (TOURNIQUET CUFF) ×1 IMPLANT
DECANTER SPIKE VIAL GLASS SM (MISCELLANEOUS) ×2 IMPLANT
DRAPE U-SHAPE 47X51 STRL (DRAPES) ×2 IMPLANT
DRSG ADAPTIC 3X8 NADH LF (GAUZE/BANDAGES/DRESSINGS) ×2 IMPLANT
DRSG PAD ABDOMINAL 8X10 ST (GAUZE/BANDAGES/DRESSINGS) ×2 IMPLANT
DURAPREP 26ML APPLICATOR (WOUND CARE) ×2 IMPLANT
ELECT REM PT RETURN 9FT ADLT (ELECTROSURGICAL) ×2
ELECTRODE REM PT RTRN 9FT ADLT (ELECTROSURGICAL) ×1 IMPLANT
EVACUATOR 1/8 PVC DRAIN (DRAIN) ×2 IMPLANT
GAUZE SPONGE 4X4 12PLY STRL (GAUZE/BANDAGES/DRESSINGS) ×2 IMPLANT
GLOVE BIO SURGEON STRL SZ7.5 (GLOVE) ×1 IMPLANT
GLOVE BIO SURGEON STRL SZ8 (GLOVE) ×2 IMPLANT
GLOVE BIOGEL PI IND STRL 6.5 (GLOVE) IMPLANT
GLOVE BIOGEL PI IND STRL 8 (GLOVE) ×1 IMPLANT
GLOVE BIOGEL PI INDICATOR 6.5 (GLOVE)
GLOVE BIOGEL PI INDICATOR 8 (GLOVE) ×2
GLOVE SURG SS PI 6.5 STRL IVOR (GLOVE) IMPLANT
GOWN STRL REUS W/TWL LRG LVL3 (GOWN DISPOSABLE) ×2 IMPLANT
GOWN STRL REUS W/TWL XL LVL3 (GOWN DISPOSABLE) ×1 IMPLANT
HANDPIECE INTERPULSE COAX TIP (DISPOSABLE) ×2
IMMOBILIZER KNEE 20 (SOFTGOODS) ×2
IMMOBILIZER KNEE 20 THIGH 36 (SOFTGOODS) ×1 IMPLANT
MANIFOLD NEPTUNE II (INSTRUMENTS) ×2 IMPLANT
NS IRRIG 1000ML POUR BTL (IV SOLUTION) ×2 IMPLANT
PACK TOTAL KNEE CUSTOM (KITS) ×2 IMPLANT
PAD ABD 7.5X8 STRL (GAUZE/BANDAGES/DRESSINGS) ×1 IMPLANT
PADDING CAST COTTON 6X4 STRL (CAST SUPPLIES) ×5 IMPLANT
POSITIONER SURGICAL ARM (MISCELLANEOUS) ×2 IMPLANT
SET HNDPC FAN SPRY TIP SCT (DISPOSABLE) ×1 IMPLANT
STRIP CLOSURE SKIN 1/2X4 (GAUZE/BANDAGES/DRESSINGS) ×3 IMPLANT
SUT MNCRL AB 4-0 PS2 18 (SUTURE) ×2 IMPLANT
SUT VIC AB 2-0 CT1 27 (SUTURE) ×6
SUT VIC AB 2-0 CT1 TAPERPNT 27 (SUTURE) ×3 IMPLANT
SUT VLOC 180 0 24IN GS25 (SUTURE) ×2 IMPLANT
SYR 50ML LL SCALE MARK (SYRINGE) ×2 IMPLANT
TRAY FOLEY W/METER SILVER 14FR (SET/KITS/TRAYS/PACK) ×2 IMPLANT
TRAY FOLEY W/METER SILVER 16FR (SET/KITS/TRAYS/PACK) ×1 IMPLANT
WATER STERILE IRR 1500ML POUR (IV SOLUTION) ×2 IMPLANT
WRAP KNEE MAXI GEL POST OP (GAUZE/BANDAGES/DRESSINGS) ×2 IMPLANT
YANKAUER SUCT BULB TIP 10FT TU (MISCELLANEOUS) ×2 IMPLANT

## 2015-07-28 NOTE — Transfer of Care (Signed)
Immediate Anesthesia Transfer of Care Note  Patient: Alyssa Jefferson  Procedure(s) Performed: Procedure(s): LEFT TOTAL KNEE ARTHROPLASTY (Left)  Patient Location: PACU  Anesthesia Type:Spinal  Level of Consciousness:  sedated, patient cooperative and responds to stimulation  Airway & Oxygen Therapy:Patient Spontanous Breathing and Patient connected to face mask oxgen  Post-op Assessment:  Report given to PACU RN and Post -op Vital signs reviewed and stable  Post vital signs:  Reviewed and stable,Spinal L4  Last Vitals:  Filed Vitals:   07/28/15 0918  BP: 146/56  Pulse: 66  Temp: 36.6 C  Resp: 18    Complications: No apparent anesthesia complications

## 2015-07-28 NOTE — Op Note (Signed)
Pre-operative diagnosis- Osteoarthritis  Left knee(s)  Post-operative diagnosis- Osteoarthritis Left knee(s)  Procedure-  Left  Total Knee Arthroplasty  Surgeon- Dione Plover. Aluisio, MD  Assistant- Arlee Muslim, PA-C   Anesthesia-  Spinal  EBL-* No blood loss amount entered *   Drains Hemovac  Tourniquet time-  Total Tourniquet Time Documented: Thigh (Left) - 36 minutes Total: Thigh (Left) - 36 minutes     Complications- None  Condition-PACU - hemodynamically stable.   Brief Clinical Note  Alyssa Jefferson is a 69 y.o. 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 She has constant pain, with activity and at rest and significant functional deficits with difficulties even with ADLs. She has had extensive non-op management including analgesics, injections of cortisone and viscosupplements, and home exercise program, but remains in significant pain with significant dysfunction. Radiographs show bone on bone arthritis medial and patellofemoral. She presents now for left Total Knee Arthroplasty.    Procedure in detail---   The patient is brought into the operating room and positioned supine on the operating table. After successful administration of  Spinal,   a tourniquet is placed high on the  Left thigh(s) and the lower extremity is prepped and draped in the usual sterile fashion. Time out is performed by the operating team and then the  Left lower extremity is wrapped in Esmarch, knee flexed and the tourniquet inflated to 300 mmHg.       A midline incision is made with a ten blade through the subcutaneous tissue to the level of the extensor mechanism. A fresh blade is used to make a medial parapatellar arthrotomy. Soft tissue over the proximal medial tibia is subperiosteally elevated to the joint line with a knife and into the semimembranosus bursa with a Cobb elevator. Soft tissue over the proximal lateral tibia is elevated with  attention being paid to avoiding the patellar tendon on the tibial tubercle. The patella is everted, knee flexed 90 degrees and the ACL and PCL are removed. Findings are bone on bone medial and patellofemoral with large global osteophytes.        The drill is used to create a starting hole in the distal femur and the canal is thoroughly irrigated with sterile saline to remove the fatty contents. The 5 degree Left  valgus alignment guide is placed into the femoral canal and the distal femoral cutting block is pinned to remove 10 mm off the distal femur. Resection is made with an oscillating saw.      The tibia is subluxed forward and the menisci are removed. The extramedullary alignment guide is placed referencing proximally at the medial aspect of the tibial tubercle and distally along the second metatarsal axis and tibial crest. The block is pinned to remove 26mm off the more deficient medial  side. Resection is made with an oscillating saw. Size 4is the most appropriate size for the tibia and the proximal tibia is prepared with the modular drill and keel punch for that size.      The femoral sizing guide is placed and size 4 is most appropriate. Rotation is marked off the epicondylar axis and confirmed by creating a rectangular flexion gap at 90 degrees. The size 4 cutting block is pinned in this rotation and the anterior, posterior and chamfer cuts are made with the oscillating saw. The intercondylar block is then placed and that cut is made.      Trial size 4 tibial component,  trial size 4 posterior stabilized femur and a 6  mm posterior stabilized rotating platform insert trial is placed. Full extension is achieved with excellent varus/valgus and anterior/posterior balance throughout full range of motion. The patella is everted and thickness measured to be 22  mm. Free hand resection is taken to 12 mm, a 35 template is placed, lug holes are drilled, trial patella is placed, and it tracks normally.  Osteophytes are removed off the posterior femur with the trial in place. All trials are removed and the cut bone surfaces prepared with pulsatile lavage. Cement is mixed and once ready for implantation, the size 4 tibial implant, size  4 posterior stabilized femoral component, and the size 35 patella are cemented in place and the patella is held with the clamp. The trial insert is placed and the knee held in full extension. The Exparel (20 ml mixed with 30 ml saline) and .25% Bupivicaine, are injected into the extensor mechanism, posterior capsule, medial and lateral gutters and subcutaneous tissues.  All extruded cement is removed and once the cement is hard the permanent 6 mm posterior stabilized rotating platform insert is placed into the tibial tray.      The wound is copiously irrigated with saline solution and the extensor mechanism closed over a hemovac drain with #1 V-loc suture. The tourniquet is released for a total tourniquet time of 36  minutes. Flexion against gravity is 130 degrees and the patella tracks normally. Subcutaneous tissue is closed with 2.0 vicryl and subcuticular with running 4.0 Monocryl. The incision is cleaned and dried and steri-strips and a bulky sterile dressing are applied. The limb is placed into a knee immobilizer and the patient is awakened and transported to recovery in stable condition.      Please note that a surgical assistant was a medical necessity for this procedure in order to perform it in a safe and expeditious manner. Surgical assistant was necessary to retract the ligaments and vital neurovascular structures to prevent injury to them and also necessary for proper positioning of the limb to allow for anatomic placement of the prosthesis.   Dione Plover Aluisio, MD    07/28/2015, 1:45 PM

## 2015-07-28 NOTE — Anesthesia Postprocedure Evaluation (Signed)
Anesthesia Post Note  Patient: Alyssa Jefferson  Procedure(s) Performed: Procedure(s) (LRB): LEFT TOTAL KNEE ARTHROPLASTY (Left)  Patient location during evaluation: PACU Anesthesia Type: Spinal Level of consciousness: oriented and awake and alert Pain management: pain level controlled Vital Signs Assessment: post-procedure vital signs reviewed and stable Respiratory status: spontaneous breathing, respiratory function stable and patient connected to nasal cannula oxygen Cardiovascular status: blood pressure returned to baseline and stable Postop Assessment: no headache, no backache and spinal receding Anesthetic complications: no    Last Vitals:  Filed Vitals:   07/28/15 1515 07/28/15 1533  BP: 129/60 128/59  Pulse: 48 48  Temp:  36.4 C  Resp: 13 14    Last Pain:  Filed Vitals:   07/28/15 1535  PainSc: [name]

## 2015-07-28 NOTE — Progress Notes (Signed)
Utilization review completed.  

## 2015-07-28 NOTE — Anesthesia Preprocedure Evaluation (Addendum)
Anesthesia Evaluation  Patient identified by MRN, date of birth, ID band Patient awake    Reviewed: Allergy & Precautions, NPO status , Patient's Chart, lab work & pertinent test results  Airway Mallampati: III  TM Distance: >3 FB Neck ROM: Full    Dental  (+) Teeth Intact   Pulmonary neg pulmonary ROS, sleep apnea , former smoker,    breath sounds clear to auscultation       Cardiovascular hypertension, + Peripheral Vascular Disease   Rhythm:Regular Rate:Normal     Neuro/Psych PSYCHIATRIC DISORDERS Depression negative neurological ROS     GI/Hepatic Neg liver ROS, GERD  Medicated,  Endo/Other  Hypothyroidism   Renal/GU negative Renal ROS  negative genitourinary   Musculoskeletal  (+) Arthritis , Osteoarthritis,    Abdominal   Peds negative pediatric ROS (+)  Hematology   Anesthesia Other Findings   Reproductive/Obstetrics                            Lab Results  Component Value Date   WBC 5.5 07/21/2015   HGB 11.0* 07/21/2015   HCT 34.4* 07/21/2015   MCV 80.0 07/21/2015   PLT 319 07/21/2015   Lab Results  Component Value Date   INR 1.07 07/21/2015   INR 1.06 07/08/2014   Lab Results  Component Value Date   CREATININE 1.31* 07/21/2015   BUN 27* 07/21/2015   NA 140 07/21/2015   K 4.2 07/21/2015   CL 104 07/21/2015   CO2 26 07/21/2015   EKG: normal sinus rhythm.   Anesthesia Physical Anesthesia Plan  ASA: III  Anesthesia Plan: Spinal   Post-op Pain Management:    Induction: Intravenous  Airway Management Planned: Natural Airway and Simple Face Mask  Additional Equipment:   Intra-op Plan:   Post-operative Plan:   Informed Consent: I have reviewed the patients History and Physical, chart, labs and discussed the procedure including the risks, benefits and alternatives for the proposed anesthesia with the patient or authorized representative who has indicated  his/her understanding and acceptance.     Plan Discussed with: CRNA  Anesthesia Plan Comments:         Anesthesia Quick Evaluation

## 2015-07-28 NOTE — Interval H&P Note (Signed)
History and Physical Interval Note:  07/28/2015 12:19 PM  Alyssa Jefferson  has presented today for surgery, with the diagnosis of OA LEFT KNEE  The various methods of treatment have been discussed with the patient and family. After consideration of risks, benefits and other options for treatment, the patient has consented to  Procedure(s): LEFT TOTAL KNEE ARTHROPLASTY (Left) as a surgical intervention .  The patient's history has been reviewed, patient examined, no change in status, stable for surgery.  I have reviewed the patient's chart and labs.  Questions were answered to the patient's satisfaction.     Gearlean Alf

## 2015-07-28 NOTE — Anesthesia Procedure Notes (Signed)
Spinal Patient location during procedure: OR Start time: 07/28/2015 12:33 PM End time: 07/28/2015 12:38 PM Staffing Anesthesiologist: Suella Broad D Resident/CRNA: Darlys Gales R Performed by: resident/CRNA  Preanesthetic Checklist Completed: patient identified, site marked, surgical consent, pre-op evaluation, timeout performed, IV checked, risks and benefits discussed and monitors and equipment checked Spinal Block Patient position: sitting Prep: Betadine Patient monitoring: heart rate, continuous pulse ox and blood pressure Location: L3-4 Injection technique: single-shot Needle Needle type: Spinocan  Needle gauge: 22 G Needle length: 9 cm Needle insertion depth: 8 cm Assessment Sensory level: T6 Additional Notes Expiration date of kit checked and confirmed. Patient tolerated procedure well, without complications.

## 2015-07-29 LAB — BASIC METABOLIC PANEL
Anion gap: 6 (ref 5–15)
BUN: 22 mg/dL — ABNORMAL HIGH (ref 6–20)
CO2: 27 mmol/L (ref 22–32)
Calcium: 9 mg/dL (ref 8.9–10.3)
Chloride: 105 mmol/L (ref 101–111)
Creatinine, Ser: 1.34 mg/dL — ABNORMAL HIGH (ref 0.44–1.00)
GFR calc Af Amer: 46 mL/min — ABNORMAL LOW (ref >60)
GFR calc non Af Amer: 40 mL/min — ABNORMAL LOW (ref >60)
Glucose, Bld: 162 mg/dL — ABNORMAL HIGH (ref 65–99)
Potassium: 4.1 mmol/L (ref 3.5–5.1)
Sodium: 138 mmol/L (ref 135–145)

## 2015-07-29 LAB — CBC
HCT: 31.8 % — ABNORMAL LOW (ref 36.0–46.0)
Hemoglobin: 10.1 g/dL — ABNORMAL LOW (ref 12.0–15.0)
MCH: 25.5 pg — ABNORMAL LOW (ref 26.0–34.0)
MCHC: 31.8 g/dL (ref 30.0–36.0)
MCV: 80.3 fL (ref 78.0–100.0)
Platelets: 290 10*3/uL (ref 150–400)
RBC: 3.96 MIL/uL (ref 3.87–5.11)
RDW: 13.8 % (ref 11.5–15.5)
WBC: 10 10*3/uL (ref 4.0–10.5)

## 2015-07-29 MED ORDER — DIPHENHYDRAMINE HCL 12.5 MG/5ML PO ELIX: 12.5000 mg | ORAL_SOLUTION | ORAL | Status: DC | PRN

## 2015-07-29 MED ORDER — DOCUSATE SODIUM 100 MG PO CAPS: 100.0000 mg | ORAL_CAPSULE | Freq: Two times a day (BID) | ORAL | Status: DC

## 2015-07-29 MED ORDER — OMEPRAZOLE 20 MG PO CPDR
20.0000 mg | DELAYED_RELEASE_CAPSULE | Freq: Every day | ORAL | Status: DC
  Administered 2015-07-29 – 2015-07-30 (×2): 20 mg via ORAL
  Filled 2015-07-29 (×2): qty 1

## 2015-07-29 MED ORDER — METOCLOPRAMIDE HCL 5 MG PO TABS: 5.0000 mg | ORAL_TABLET | Freq: Three times a day (TID) | ORAL | Status: DC | PRN

## 2015-07-29 MED ORDER — TRAMADOL HCL 50 MG PO TABS: 50.0000 mg | ORAL_TABLET | Freq: Four times a day (QID) | ORAL | Status: DC | PRN

## 2015-07-29 MED ORDER — NON FORMULARY: 20.0000 mg | Freq: Every day | Status: DC

## 2015-07-29 MED ORDER — ONDANSETRON HCL 4 MG PO TABS: 4.0000 mg | ORAL_TABLET | Freq: Four times a day (QID) | ORAL | Status: DC | PRN

## 2015-07-29 MED ORDER — POLYETHYLENE GLYCOL 3350 17 G PO PACK: 17.0000 g | PACK | Freq: Every day | ORAL | Status: DC | PRN

## 2015-07-29 MED ORDER — APIXABAN 2.5 MG PO TABS: 2.5000 mg | ORAL_TABLET | Freq: Two times a day (BID) | ORAL | Status: DC

## 2015-07-29 MED ORDER — ACETAMINOPHEN 325 MG PO TABS: 650.0000 mg | ORAL_TABLET | Freq: Four times a day (QID) | ORAL | Status: DC | PRN

## 2015-07-29 MED ORDER — BISACODYL 10 MG RE SUPP: 10.0000 mg | Freq: Every day | RECTAL | Status: DC | PRN

## 2015-07-29 MED ORDER — FLEET ENEMA 7-19 GM/118ML RE ENEM: 1.0000 | ENEMA | Freq: Once | RECTAL | Status: DC | PRN

## 2015-07-29 MED ORDER — METHOCARBAMOL 500 MG PO TABS: 500.0000 mg | ORAL_TABLET | Freq: Four times a day (QID) | ORAL | Status: DC | PRN

## 2015-07-29 MED ORDER — OXYCODONE HCL 5 MG PO TABS: 5.0000 mg | ORAL_TABLET | ORAL | Status: DC | PRN

## 2015-07-29 MED ORDER — LIP MEDEX EX OINT
TOPICAL_OINTMENT | CUTANEOUS | Status: AC
  Filled 2015-07-29: qty 7

## 2015-07-29 NOTE — Progress Notes (Signed)
   07/29/15 1400  PT Visit Information  Last PT Received On 07/29/15  Assistance Needed +1  History of Present Illness s/p L TKA  PT Time Calculation  PT Start Time (ACUTE ONLY) 1330  PT Stop Time (ACUTE ONLY) 1357  PT Time Calculation (min) (ACUTE ONLY) 27 min  Subjective Data  Patient Stated Goal rehab L knee and get ready to have right knee surgery  Precautions  Precautions Knee  Required Braces or Orthoses Knee Immobilizer - Left  Knee Immobilizer - Left Discontinue once straight leg raise with < 10 degree lag  Restrictions  Other Position/Activity Restrictions WBAT  Pain Assessment  Pain Assessment 0-10  Pain Score 3  Pain Location L knee  Pain Descriptors / Indicators Sore  Pain Intervention(s) Monitored during session;Limited activity within patient's tolerance;Repositioned;Ice applied  Cognition  Arousal/Alertness Awake/alert  Behavior During Therapy WFL for tasks assessed/performed  Overall Cognitive Status Within Functional Limits for tasks assessed  Bed Mobility  Overal bed mobility Needs Assistance  Bed Mobility Sit to Supine  Sit to supine Min assist  General bed mobility comments min with LLE  Transfers  Overall transfer level Needs assistance  Equipment used Rolling walker (2 wheeled)  Transfers Sit to/from Stand  Sit to Stand Min guard;Min assist  General transfer comment cues for safety, hand placement and LLE position  Ambulation/Gait  Ambulation/Gait assistance Min guard;Min assist  Ambulation Distance (Feet) 94 Feet  Assistive device Rolling walker (2 wheeled)  Gait Pattern/deviations Step-to pattern;Step-through pattern;Trunk flexed;Decreased stride length  General Gait Details incr time, cues for sequence and RW position from self and safety with turns  Total Joint Exercises  Ankle Circles/Pumps AROM;Both;10 reps  Heel Slides AAROM;Left;10 reps  PT - End of Session  Equipment Utilized During Treatment Gait belt;Left knee immobilizer  Activity  Tolerance Patient tolerated treatment well  Patient left in bed;with call bell/phone within reach;with family/visitor present  Nurse Communication Mobility status  PT - Assessment/Plan  PT Plan Current plan remains appropriate  PT Frequency (ACUTE ONLY) 7X/week  Follow Up Recommendations SNF  PT equipment None recommended by PT  PT Goal Progression  Progress towards PT goals Progressing toward goals  Acute Rehab PT Goals  PT Goal Formulation With patient  Time For Goal Achievement 08/01/15  Potential to Achieve Goals Good  PT General Charges  $$ ACUTE PT VISIT 1 Procedure  PT Treatments  $Gait Training 23-37 mins

## 2015-07-29 NOTE — Clinical Social Work Placement (Signed)
   CLINICAL SOCIAL WORK PLACEMENT  NOTE  Date:  07/29/2015  Patient Details  Name: Alyssa Jefferson MRN: [id number] Date of Birth: [birth year]  Clinical Social Work is seeking post-discharge placement for this patient at the [place] level of care (*CSW will initial, date and re-position this form in  chart as items are completed):  No   Patient/family provided with [place] Work Department's list of facilities offering this level of care within the geographic area requested by the patient (or if unable, by the patient's family).  Yes   Patient/family informed of their freedom to choose among providers that offer the needed level of care, that participate in Medicare, Medicaid or managed care program needed by the patient, have an available bed and are willing to accept the patient.  No   Patient/family informed of [facility]'s ownership interest in [facility] and [facility], as well as of the fact that they are under no obligation to receive care at these facilities.  PASRR submitted to EDS on 07/29/15     PASRR number received on 07/29/15     Existing PASRR number confirmed on       FL2 transmitted to all facilities in geographic area requested by pt/family on 07/29/15     FL2 transmitted to all facilities within larger geographic area on       Patient informed that his/her managed care company has contracts with or will negotiate with certain facilities, including the following:        Yes   Patient/family informed of bed offers received.  Patient chooses bed at [facility]     Physician recommends and patient chooses bed at [facility]    Patient to be transferred to [facility] on  .  Patient to be transferred to facility by       Patient family notified on   of transfer.  Name of family member notified:        PHYSICIAN       Additional Comment:    _______________________________________________ Luretha Rued, [place]  [phone] 07/29/2015, 2:19 PM

## 2015-07-29 NOTE — Progress Notes (Signed)
OT Cancellation Note  Patient Details Name: Alyssa Jefferson MRN: [id number] DOB: [birth year]   Cancelled Treatment:    Reason Eval/Treat Not Completed: Other (comment) See plan is for SNF. Will defer OT eval to SNF.   Jules Schick  [id number] 07/29/2015, 11:39 AM

## 2015-07-29 NOTE — Progress Notes (Signed)
   Subjective: 1 Day Post-Op Procedure(s) (LRB): LEFT TOTAL KNEE ARTHROPLASTY (Left) Patient reports pain as mild.   Patient seen in rounds with Dr. Wynelle Link. She is sitting up in bed. Patient is well, and has had no acute complaints or problems We will start therapy today.  Plan is to go Skilled nursing facility after hospital stay. She wants to look into [facility].  Objective: Vital signs in last 24 hours: Temp:  [97.4 F (36.3 C)-98.4 F (36.9 C)] 98.3 F (36.8 C) (12/13 0503) Pulse Rate:  [48-94] 54 (12/13 0503) Resp:  [11-18] 16 (12/13 0503) BP: (117-160)/(46-79) 145/66 mmHg (12/13 0503) SpO2:  [98 %-100 %] 100 % (12/13 0503) Weight:  [108.41 kg (239 lb)] 108.41 kg (239 lb) (12/12 1040)  Intake/Output from previous day:  Intake/Output Summary (Last 24 hours) at 07/29/15 0753 Last data filed at 07/29/15 0546  Gross per 24 hour  Intake 4516.25 ml  Output   3230 ml  Net 1286.25 ml    Intake/Output this shift: UOP 1600  Labs:  Recent Labs  07/29/15 0511  HGB 10.1*    Recent Labs  07/29/15 0511  WBC 10.0  RBC 3.96  HCT 31.8*  PLT 290    Recent Labs  07/29/15 0511  NA 138  K 4.1  CL 105  CO2 27  BUN 22*  CREATININE 1.34*  GLUCOSE 162*  CALCIUM 9.0   No results for input(s): LABPT, INR in the last 72 hours.  EXAM General - Patient is Alert, Appropriate and Oriented Extremity - Neurovascular intact Sensation intact distally Dorsiflexion/Plantar flexion intact Dressing - dressing C/D/I Motor Function - intact, moving foot and toes well on exam.  Hemovac pulled without difficulty. Foley already out.  Past Medical History  Diagnosis Date  . High cholesterol   . Hypertension   . Depression   . Cancer [facility]) 2004    breast  . Glaucoma   . Thyroid disease     thyroid removed  . Hypothyroidism   . GERD (gastroesophageal reflux disease)   . Arthritis     knees, back  . Heart palpitations   . Nocturia   . History of transfusion     many  years ago  . OSA (obstructive sleep apnea)     tried CPAP- but unsuccessful  . Itching in the vaginal area     Assessment/Plan: 1 Day Post-Op Procedure(s) (LRB): LEFT TOTAL KNEE ARTHROPLASTY (Left) Principal Problem:   Osteoarthritis, knee Active Problems:   OA (osteoarthritis) of knee  Estimated body mass index is 43.7 kg/(m^2) as calculated from the following:   Height as of this encounter: 5\' 2"  (1.575 m).   Weight as of this encounter: 108.41 kg (239 lb). Advance diet Up with therapy Plan for discharge tomorrow Discharge to SNF - looking into [facility]  DVT Prophylaxis - Eliquis Weight-Bearing as tolerated to left leg D/C O2 and Pulse OX and try on Room Air  Alyssa Muslim, PA-C Orthopaedic Surgery 07/29/2015, 7:53 AM

## 2015-07-29 NOTE — Discharge Summary (Signed)
Physician Discharge Summary   Patient ID: WAVA KILDOW MRN: [id number] DOB/AGE: 68/68/1947 68 y.o.  Admit date: 07/28/2015 Discharge date: 07/30/2015  Primary Diagnosis:  Osteoarthritis Left knee(s) Admission Diagnoses:  Past Medical History  Diagnosis Date  . High cholesterol   . Hypertension   . Depression   . Cancer [facility]) 2004    breast  . Glaucoma   . Thyroid disease     thyroid removed  . Hypothyroidism   . GERD (gastroesophageal reflux disease)   . Arthritis     knees, back  . Heart palpitations   . Nocturia   . History of transfusion     many years ago  . OSA (obstructive sleep apnea)     tried CPAP- but unsuccessful  . Itching in the vaginal area    Discharge Diagnoses:   Principal Problem:   Osteoarthritis, knee Active Problems:   OA (osteoarthritis) of knee  Estimated body mass index is 43.7 kg/(m^2) as calculated from the following:   Height as of this encounter: '5\' 2"'  (1.575 m).   Weight as of this encounter: 108.41 kg (239 lb).  Procedure:  Procedure(s) (LRB): LEFT TOTAL KNEE ARTHROPLASTY (Left)   Consults: None  HPI: Alyssa Jefferson is a 69 y.o. 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 She has constant pain, with activity and at rest and significant functional deficits with difficulties even with ADLs. She has had extensive non-op management including analgesics, injections of cortisone and viscosupplements, and home exercise program, but remains in significant pain with significant dysfunction. Radiographs show bone on bone arthritis medial and patellofemoral. She presents now for left Total Knee Arthroplasty.  Laboratory Data: Admission on 07/28/2015  Component Date Value Ref Range Status  . WBC 07/29/2015 10.0  4.0 - 10.5 K/uL Final  . RBC 07/29/2015 3.96  3.87 - 5.11 MIL/uL Final  . Hemoglobin 07/29/2015 10.1* 12.0 - 15.0 g/dL Final  . HCT 07/29/2015 31.8* 36.0 - 46.0 % Final  .  MCV 07/29/2015 80.3  78.0 - 100.0 fL Final  . MCH 07/29/2015 25.5* 26.0 - 34.0 pg Final  . MCHC 07/29/2015 31.8  30.0 - 36.0 g/dL Final  . RDW 07/29/2015 13.8  11.5 - 15.5 % Final  . Platelets 07/29/2015 290  150 - 400 K/uL Final  . Sodium 07/29/2015 138  135 - 145 mmol/L Final  . Potassium 07/29/2015 4.1  3.5 - 5.1 mmol/L Final  . Chloride 07/29/2015 105  101 - 111 mmol/L Final  . CO2 07/29/2015 27  22 - 32 mmol/L Final  . Glucose, Bld 07/29/2015 162* 65 - 99 mg/dL Final  . BUN 07/29/2015 22* 6 - 20 mg/dL Final  . Creatinine, Ser 07/29/2015 1.34* 0.44 - 1.00 mg/dL Final  . Calcium 07/29/2015 9.0  8.9 - 10.3 mg/dL Final  . GFR calc non Af Amer 07/29/2015 40* >60 mL/min Final  . GFR calc Af Amer 07/29/2015 46* >60 mL/min Final   Comment: (NOTE) The eGFR has been calculated using the CKD EPI equation. This calculation has not been validated in all clinical situations. eGFR's persistently <60 mL/min signify possible Chronic Kidney Disease.   Georgiann Hahn gap 07/29/2015 6  5 - 15 Final  Hospital Outpatient Visit on 07/21/2015  Component Date Value Ref Range Status  . aPTT 07/21/2015 34  24 - 37 seconds Final  . WBC 07/21/2015 5.5  4.0 - 10.5 K/uL Final  . RBC 07/21/2015 4.30  3.87 -  5.11 MIL/uL Final  . Hemoglobin 07/21/2015 11.0* 12.0 - 15.0 g/dL Final  . HCT 07/21/2015 34.4* 36.0 - 46.0 % Final  . MCV 07/21/2015 80.0  78.0 - 100.0 fL Final  . MCH 07/21/2015 25.6* 26.0 - 34.0 pg Final  . MCHC 07/21/2015 32.0  30.0 - 36.0 g/dL Final  . RDW 07/21/2015 14.4  11.5 - 15.5 % Final  . Platelets 07/21/2015 319  150 - 400 K/uL Final  . Sodium 07/21/2015 140  135 - 145 mmol/L Final  . Potassium 07/21/2015 4.2  3.5 - 5.1 mmol/L Final  . Chloride 07/21/2015 104  101 - 111 mmol/L Final  . CO2 07/21/2015 26  22 - 32 mmol/L Final  . Glucose, Bld 07/21/2015 88  65 - 99 mg/dL Final  . BUN 07/21/2015 27* 6 - 20 mg/dL Final  . Creatinine, Ser 07/21/2015 1.31* 0.44 - 1.00 mg/dL Final  . Calcium  07/21/2015 9.4  8.9 - 10.3 mg/dL Final  . Total Protein 07/21/2015 7.8  6.5 - 8.1 g/dL Final  . Albumin 07/21/2015 3.9  3.5 - 5.0 g/dL Final  . AST 07/21/2015 17  15 - 41 U/L Final  . ALT 07/21/2015 15  14 - 54 U/L Final  . Alkaline Phosphatase 07/21/2015 73  38 - 126 U/L Final  . Total Bilirubin 07/21/2015 0.7  0.3 - 1.2 mg/dL Final  . GFR calc non Af Amer 07/21/2015 41* >60 mL/min Final  . GFR calc Af Amer 07/21/2015 47* >60 mL/min Final   Comment: (NOTE) The eGFR has been calculated using the CKD EPI equation. This calculation has not been validated in all clinical situations. eGFR's persistently <60 mL/min signify possible Chronic Kidney Disease.   . Anion gap 07/21/2015 10  5 - 15 Final  . Prothrombin Time 07/21/2015 14.1  11.6 - 15.2 seconds Final  . INR 07/21/2015 1.07  0.00 - 1.49 Final  . ABO/RH(D) 07/21/2015 A POS   Final  . Antibody Screen 07/21/2015 NEG   Final  . Sample Expiration 07/21/2015 07/31/2015   Final  . Extend sample reason 07/21/2015 NO TRANSFUSIONS OR PREGNANCY IN THE PAST 3 MONTHS   Final  . Color, Urine 07/21/2015 YELLOW  YELLOW Final  . APPearance 07/21/2015 CLEAR  CLEAR Final  . Specific Gravity, Urine 07/21/2015 1.017  1.005 - 1.030 Final  . pH 07/21/2015 6.0  5.0 - 8.0 Final  . Glucose, UA 07/21/2015 NEGATIVE  NEGATIVE mg/dL Final  . Hgb urine dipstick 07/21/2015 NEGATIVE  NEGATIVE Final  . Bilirubin Urine 07/21/2015 NEGATIVE  NEGATIVE Final  . Ketones, ur 07/21/2015 NEGATIVE  NEGATIVE mg/dL Final  . Protein, ur 07/21/2015 NEGATIVE  NEGATIVE mg/dL Final  . Nitrite 07/21/2015 NEGATIVE  NEGATIVE Final  . Leukocytes, UA 07/21/2015 NEGATIVE  NEGATIVE Final   MICROSCOPIC NOT DONE ON URINES WITH NEGATIVE PROTEIN, BLOOD, LEUKOCYTES, NITRITE, OR GLUCOSE <1000 mg/dL.  Marland Kitchen MRSA, PCR 07/21/2015 NEGATIVE  NEGATIVE Final  . Staphylococcus aureus 07/21/2015 POSITIVE* NEGATIVE Final   Comment:        The Xpert SA Assay (FDA approved for NASAL specimens in patients  over 9 years of age), is one component of a comprehensive surveillance program.  Test performance has been validated by [facility] for patients greater than or equal to 35 year old. It is not intended to diagnose infection nor to guide or monitor treatment.   . ABO/RH(D) 07/21/2015 A POS   Final  Office Visit on 07/01/2015  Component Date Value Ref Range Status  .  POC Glucose 07/01/2015 91  70 - 99 mg/dl Final  . Sodium 07/01/2015 140  135 - 146 mmol/L Final  . Potassium 07/01/2015 4.0  3.5 - 5.3 mmol/L Final  . Chloride 07/01/2015 103  98 - 110 mmol/L Final  . CO2 07/01/2015 28  20 - 31 mmol/L Final  . Glucose, Bld 07/01/2015 86  65 - 99 mg/dL Final  . BUN 07/01/2015 22  7 - 25 mg/dL Final  . Creat 07/01/2015 1.20* 0.50 - 0.99 mg/dL Final  . Calcium 07/01/2015 9.5  8.6 - 10.4 mg/dL Final  . GFR, Est African American 07/01/2015 54* >=60 mL/min Final  . GFR, Est Non African American 07/01/2015 47* >=60 mL/min Final   Comment:   The estimated GFR is a calculation valid for adults (>=30 years old) that uses the CKD-EPI algorithm to adjust for age and sex. It is   not to be used for children, pregnant women, hospitalized patients,    patients on dialysis, or with rapidly changing kidney function. According to the NKDEP, eGFR >89 is normal, 60-89 shows mild impairment, 30-59 shows moderate impairment, 15-29 shows severe impairment and <15 is ESRD.     . Yeast by KOH 07/01/2015 Absent  Present, Absent Final  . Yeast by wet prep 07/01/2015 Absent  Present, Absent Final  . WBC by wet prep 07/01/2015 Few  None, Few, Too numerous to count Final  . Clue Cells Wet Prep HPF POC 07/01/2015 None  None, Too numerous to count Final  . Trich by wet prep 07/01/2015 Absent  Present, Absent Final  . Bacteria Wet Prep HPF POC 07/01/2015 None  None, Few, Too numerous to count Final  . Epithelial Cells By [employer] ([facility]) 07/01/2015 Moderate* None, Few, Too numerous to count Final  . RBC,UR,HPF,POC  07/01/2015 None  None RBC/hpf Final  Office Visit on 06/17/2015  Component Date Value Ref Range Status  . Yeast by KOH 06/17/2015 Absent  Present, Absent Final  . Yeast by wet prep 06/17/2015 Present  Present, Absent Final  . WBC by wet prep 06/17/2015 Few  None, Few, Too numerous to count Final  . Clue Cells Wet Prep HPF POC 06/17/2015 None  None, Too numerous to count Final  . Trich by wet prep 06/17/2015 Absent  Present, Absent Final  . Bacteria Wet Prep HPF POC 06/17/2015 Few  None, Few, Too numerous to count Final  . Epithelial Cells By [employer] ([facility]) 06/17/2015 Few  None, Few, Too numerous to count Final  . RBC,UR,HPF,POC 06/17/2015 None  None RBC/hpf Final     X-Rays:No results found.  EKG: Orders placed or performed in visit on 10/31/14  . EKG 12-Lead     Hospital Course: HERBERT AGUINALDO is a 69 y.o. who was admitted to [facility]. They were brought to the operating room on 07/28/2015 and underwent Procedure(s): LEFT TOTAL KNEE ARTHROPLASTY.  Patient tolerated the procedure well and was later transferred to the recovery room and then to the orthopaedic floor for postoperative care.  They were given PO and IV analgesics for pain control following their surgery.  They were given 24 hours of postoperative antibiotics of  Anti-infectives    Start     Dose/Rate Route Frequency Ordered Stop   07/28/15 1900  ceFAZolin (ANCEF) IVPB 2 g/50 mL premix     2 g 100 mL/hr over 30 Minutes Intravenous Every 6 hours 07/28/15 1539 07/29/15 0238   07/28/15 1004  ceFAZolin (ANCEF) IVPB 2 g/50 mL premix  2 g 100 mL/hr over 30 Minutes Intravenous On call to O.R. 07/28/15 1004 07/28/15 1242     and started on DVT prophylaxis in the form of Eliquis.   PT and OT were ordered for total joint protocol.  Discharge planning consulted to help with postop disposition and equipment needs. Social worker consulted to assist with placement into a SNF.  She wanted to look into [facility]. Patient  had a good night on the evening of surgery.  They started to get up OOB with therapy on day one. Hemovac drain was pulled without difficulty.  Continued to work with therapy into day two.  Dressing was changed on day two and the incision was healing well. Patient was seen in rounds and was ready to go to the SNF on POD 2.   Diet: Cardiac diet Activity:WBAT Follow-up:in 2 weeks Disposition - [facility] Discharged Condition: good   Discharge Instructions    Call MD / Call 911    Complete by:  As directed   If you experience chest pain or shortness of breath, CALL 911 and be transported to the hospital emergency room.  If you develope a fever above 101 F, pus (white drainage) or increased drainage or redness at the wound, or calf pain, call your surgeon's office.     Change dressing    Complete by:  As directed   Change dressing daily with sterile 4 x 4 inch gauze dressing and apply TED hose. Do not submerge the incision under water.     Constipation Prevention    Complete by:  As directed   Drink plenty of fluids.  Prune juice may be helpful.  You may use a stool softener, such as Colace (over the counter) 100 mg twice a day.  Use MiraLax (over the counter) for constipation as needed.     Diet - low sodium heart healthy    Complete by:  As directed      Discharge instructions    Complete by:  As directed   Pick up stool softner and laxative for home use following surgery while on pain medications. Do not submerge incision under water. Please use good hand washing techniques while changing dressing each day. May shower starting three days after surgery. Please use a clean towel to pat the incision dry following showers. Continue to use ice for pain and swelling after surgery. Do not use any lotions or creams on the incision until instructed by your surgeon.  Take Eliquis twice a day for two and a half more weeks, then discontinue Eliquis. Once the patient has  completed the blood thinner regimen, then take a Baby 81 mg Aspirin daily for three more weeks.  Postoperative Constipation Protocol  Constipation - defined medically as fewer than three stools per week and severe constipation as less than one stool per week.  One of the most common issues patients have following surgery is constipation.  Even if you have a regular bowel pattern at home, your normal regimen is likely to be disrupted due to multiple reasons following surgery.  Combination of anesthesia, postoperative narcotics, change in appetite and fluid intake all can affect your bowels.  In order to avoid complications following surgery, here are some recommendations in order to help you during your recovery period.  Colace (docusate) - Pick up an over-the-counter form of Colace or another stool softener and take twice a day as long as you are requiring postoperative pain medications.  Take  with a full glass of water daily.  If you experience loose stools or diarrhea, hold the colace until you stool forms back up.  If your symptoms do not get better within 1 week or if they get worse, check with your doctor.  Dulcolax (bisacodyl) - Pick up over-the-counter and take as directed by the product packaging as needed to assist with the movement of your bowels.  Take with a full glass of water.  Use this product as needed if not relieved by Colace only.   MiraLax (polyethylene glycol) - Pick up over-the-counter to have on hand.  MiraLax is a solution that will increase the amount of water in your bowels to assist with bowel movements.  Take as directed and can mix with a glass of water, juice, soda, coffee, or tea.  Take if you go more than two days without a movement. Do not use MiraLax more than once per day. Call your doctor if you are still constipated or irregular after using this medication for 7 days in a row.  If you continue to have problems with postoperative constipation, please contact the  office for further assistance and recommendations.  If you experience "the worst abdominal pain ever" or develop nausea or vomiting, please contact the office immediatly for further recommendations for treatment.  When discharged from the skilled rehab facility, please have the facility set up the patient's [place] prior to being released.  Please make sure this gets set up prior to release in order to avoid any lapse of therapy following the rehab stay.  Also provide the patient with their medications at time of release from the facility to include their pain medication, the muscle relaxants, and their blood thinner medication.  If the patient is still at the rehab facility at time of follow up appointment, please also assist the patient in arranging follow up appointment in our office and any transportation needs. ICE to the affected knee or hip every three hours for 30 minutes at a time and then as needed for pain and swelling.     Do not put a pillow under the knee. Place it under the heel.    Complete by:  As directed      Do not sit on low chairs, stoools or toilet seats, as it may be difficult to get up from low surfaces    Complete by:  As directed      Driving restrictions    Complete by:  As directed   No driving until released by the physician.     Increase activity slowly as tolerated    Complete by:  As directed      Lifting restrictions    Complete by:  As directed   No lifting until released by the physician.     Patient may shower    Complete by:  As directed   You may shower without a dressing once there is no drainage.  Do not wash over the wound.  If drainage remains, do not shower until drainage stops.     TED hose    Complete by:  As directed   Use stockings (TED hose) for 3 weeks on both leg(s).  You may remove them at night for sleeping.     Weight bearing as tolerated    Complete by:  As directed   Laterality:  left  Extremity:  Lower              Medication List  STOP taking these medications        mupirocin ointment 2 %  Commonly known as:  BACTROBAN      TAKE these medications        acetaminophen 325 MG tablet  Commonly known as:  TYLENOL  Take 2 tablets (650 mg total) by mouth every 6 (six) hours as needed for mild pain (or Fever >/= 101).     albuterol 108 (90 BASE) MCG/ACT inhaler  Commonly known as:  PROVENTIL HFA;VENTOLIN HFA  Inhale 2 puffs into the lungs every 6 (six) hours as needed for wheezing or shortness of breath.     apixaban 2.5 MG Tabs tablet  Commonly known as:  ELIQUIS  Take 1 tablet (2.5 mg total) by mouth every 12 (twelve) hours. Take Eliquis twice a day for two and a half more weeks, then discontinue Eliquis. Once the patient has completed the blood thinner regimen, then take a Baby 81 mg Aspirin daily for three more weeks.     Azelastine HCl 0.15 % Soln  Place 2 sprays into the nose 2 (two) times daily.     bisacodyl 10 MG suppository  Commonly known as:  DULCOLAX  Place 1 suppository (10 mg total) rectally daily as needed for moderate constipation.     clotrimazole 1 % vaginal cream  Commonly known as:  GYNE-LOTRIMIN  Place 1 Applicatorful vaginally at bedtime.     diphenhydrAMINE 12.5 MG/5ML elixir  Commonly known as:  BENADRYL  Take 5-10 mLs (12.5-25 mg total) by mouth every 4 (four) hours as needed for itching.     docusate sodium 100 MG capsule  Commonly known as:  COLACE  Take 1 capsule (100 mg total) by mouth 2 (two) times daily.     fluticasone 50 MCG/ACT nasal spray  Commonly known as:  FLONASE  Place 1 spray into both nostrils daily as needed for allergies.     latanoprost 0.005 % ophthalmic solution  Commonly known as:  XALATAN  Place 1 drop into the right eye at bedtime.     levothyroxine 100 MCG tablet  Commonly known as:  SYNTHROID  Take 1 tablet (100 mcg total) by mouth daily before breakfast.     methocarbamol 500 MG tablet  Commonly known as:  ROBAXIN   Take 1 tablet (500 mg total) by mouth every 6 (six) hours as needed for muscle spasms.     metoCLOPramide 5 MG tablet  Commonly known as:  REGLAN  Take 1 tablet (5 mg total) by mouth every 8 (eight) hours as needed for nausea (if ondansetron (ZOFRAN) ineffective.).     omeprazole 20 MG capsule  Commonly known as:  PRILOSEC  Take 1 capsule (20 mg total) by mouth daily.     ondansetron 4 MG tablet  Commonly known as:  ZOFRAN  Take 1 tablet (4 mg total) by mouth every 6 (six) hours as needed for nausea.     oxyCODONE 5 MG immediate release tablet  Commonly known as:  Oxy IR/ROXICODONE  Take 1-2 tablets (5-10 mg total) by mouth every 3 (three) hours as needed for moderate pain or severe pain.     polyethylene glycol packet  Commonly known as:  MIRALAX / GLYCOLAX  Take 17 g by mouth daily as needed for mild constipation or moderate constipation.     rosuvastatin 10 MG tablet  Commonly known as:  CRESTOR  Take 10 mg by mouth at bedtime.     sodium phosphate 7-19 GM/118ML Enem  Place  133 mLs (1 enema total) rectally once as needed for severe constipation.     traMADol 50 MG tablet  Commonly known as:  ULTRAM  Take 1-2 tablets (50-100 mg total) by mouth every 6 (six) hours as needed (mild pain).     valsartan-hydrochlorothiazide 320-12.5 MG tablet  Commonly known as:  DIOVAN-HCT  Take 1 tablet by mouth daily.           Follow-up Information    Follow up with [facility] .   Specialty:  Skilled Nursing Facility   Contact information:   [place] [phone]      Follow up with Gearlean Alf, MD. Schedule an appointment as soon as possible for a visit in 2 weeks.   Specialty:  Orthopedic Surgery   Why:  Call office ASAP at [phone] to setup appointment in two weeks.   Contact information:   [address] [place] [address] [phone]       Signed: Arlee Muslim, PA-C Orthopaedic  Surgery 07/29/2015, 8:06 PM

## 2015-07-29 NOTE — Clinical Social Work Note (Signed)
Clinical Social Work Assessment  Patient Details  Name: Alyssa Jefferson MRN: [id number] Date of Birth: May 31, 1946  Date of referral:  07/29/15               Reason for consult:  Facility Placement, Discharge Planning                Permission sought to share information with:  Chartered certified accountant granted to share information::  Yes, Verbal Permission Granted  Name::        Agency::     Relationship::     Contact Information:     Housing/Transportation Living arrangements for the past 2 months:  Single Family Home Source of Information:    Patient Interpreter Needed:  None Criminal Activity/Legal Involvement Pertinent to Current Situation/Hospitalization:  No - Comment as needed Significant Relationships:  Siblings Lives with:  Self Do you feel safe going back to the place where you live?   (ST Rehab needed.) Need for family participation in patient care:  No (Coment)  Care giving concerns:  Pt's care cannot be managed at home following hospital d/c.   Social Worker assessment / plan:  Pt hospitalized on 07/29/15 for pre planned left total knee arthroplasty.  CSW met with pt to assist with d/c planning. Pt will need ST Rehab at d/c. Pt has requested [facility]. SNF contacted, clinicals sent for review. [facility] has offered ST placement and pt has accepted offer. CSW will continue to follow to assist with d/c planning to SNF.  Employment status:  Retired Nurse, adult PT Recommendations:  [place] / Referral to community resources:  [place]  Patient/Family's Response to care:  Pt agrees with plan for [employer].  Patient/Family's Understanding of and Emotional Response to Diagnosis, Current Treatment, and Prognosis:  Pt is aware of her medical status. She is motivated to work with therapy and is pleased [facility] has an opening for her.  Emotional Assessment Appearance:  Appears  stated age Attitude/Demeanor/Rapport:  Other (cooperative) Affect (typically observed):  Calm, Pleasant Orientation:  Oriented to Self, Oriented to Place, Oriented to  Time, Oriented to Situation Alcohol / Substance use:  Not Applicable Psych involvement (Current and /or in the community):  No (Comment)  Discharge Needs  Concerns to be addressed:  Discharge Planning Concerns Readmission within the last 30 days:  No Current discharge risk:  None Barriers to Discharge:  No Barriers Identified   Luretha Rued, [place] 07/29/2015, 2:13 PM

## 2015-07-29 NOTE — Care Management Note (Signed)
Case Management Note  Patient Details  Name: ZNYA ELK MRN: [id number] Date of Birth: 01-24-1946  Subjective/Objective:    S/p Left Total Knee Arthroplasty                Action/Plan: Discharge planning per CSW  Expected Discharge Date:  07/30/15               Expected Discharge Plan:  [place]  In-House Referral:  Clinical Social Work  Discharge planning Services  CM Consult  Post Acute Care Choice:  NA Choice offered to:  NA  DME Arranged:  N/A DME Agency:  NA  HH Arranged:  NA HH Agency:  NA  Status of Service:  Completed, signed off  Medicare Important Message Given:    Date Medicare IM Given:    Medicare IM give by:    Date Additional Medicare IM Given:    Additional Medicare Important Message give by:     If discussed at [place] of Stay Meetings, dates discussed:    Additional Comments:  Guadalupe Maple, RN 07/29/2015, 11:24 AM

## 2015-07-29 NOTE — Evaluation (Signed)
Physical Therapy Evaluation Patient Details Name: OPLE ISHIBASHI MRN: [id number] DOB: 1945/12/10 Today's Date: 07/29/2015   History of Present Illness  s/p L TKA  Clinical Impression  Pt is s/p L TKA resulting in the deficits listed below (see PT Problem List).  Pt will benefit from skilled PT to increase their independence and safety with mobility to allow discharge to the venue listed below. Recommend SNF placement post acute, will continue to folllow     Follow Up Recommendations SNF    Equipment Recommendations  None recommended by PT    Recommendations for Other Services       Precautions / Restrictions Precautions Precautions: Knee Required Braces or Orthoses: Knee Immobilizer - Left Knee Immobilizer - Left: Discontinue once straight leg raise with < 10 degree lag Restrictions Weight Bearing Restrictions: No Other Position/Activity Restrictions: WBAT      Mobility  Bed Mobility               General bed mobility comments: pt in recliner  Transfers Overall transfer level: Needs assistance Equipment used: Rolling walker (2 wheeled) Transfers: Sit to/from Stand Sit to Stand: Min assist         General transfer comment: cues for safety, hand placement and LLE position  Ambulation/Gait Ambulation/Gait assistance: Min guard Ambulation Distance (Feet): 40 Feet Assistive device: Rolling walker (2 wheeled) Gait Pattern/deviations: Step-to pattern;Trunk flexed;Antalgic     General Gait Details: incr time, cues for sequence and RW position from self  Stairs            Wheelchair Mobility    Modified Rankin (Stroke Patients Only)       Balance                                             Pertinent Vitals/Pain Pain Assessment: 0-10 Pain Score: 2  Pain Location: L knee Pain Descriptors / Indicators: Sore Pain Intervention(s): Limited activity within patient's tolerance;Monitored during session;Premedicated before  session;Repositioned;Ice applied    Home Living Family/patient expects to be discharged to:: Skilled nursing facility Living Arrangements: Alone               Additional Comments: pt planning to go to [facility]    Prior Function Level of Independence: Independent;Independent with assistive device(s)         Comments: amb with cane sometimes     Hand Dominance        Extremity/Trunk Assessment   Upper Extremity Assessment: Defer to OT evaluation           Lower Extremity Assessment: LLE deficits/detail   LLE Deficits / Details: ankle WFL, knee extension and hip flexion 2+/5     Communication   Communication: No difficulties  Cognition Arousal/Alertness: Awake/alert Behavior During Therapy: WFL for tasks assessed/performed Overall Cognitive Status: Within Functional Limits for tasks assessed                      General Comments      Exercises Total Joint Exercises Ankle Circles/Pumps: AROM;Both;10 reps Quad Sets: 10 reps;Both;AROM;Strengthening Heel Slides: AAROM;Left;10 reps Hip ABduction/ADduction: 10 reps;AAROM;Left Straight Leg Raises: AAROM;Left;10 reps Goniometric ROM: grossly 10 to 55* flexion      Assessment/Plan    PT Assessment Patient needs continued PT services  PT Diagnosis Difficulty walking   PT Problem List Decreased strength;Decreased range of motion;Decreased activity tolerance;Decreased  balance;Decreased mobility;Decreased knowledge of precautions;Pain  PT Treatment Interventions DME instruction;Gait training;Functional mobility training;Therapeutic activities;Patient/family education;Therapeutic exercise   PT Goals (Current goals can be found in the Care Plan section) Acute Rehab PT Goals Patient Stated Goal: rehab L knee and get ready to have right knee surgery PT Goal Formulation: With patient Time For Goal Achievement: 08/01/15 Potential to Achieve Goals: Good    Frequency 7X/week   Barriers to discharge         Co-evaluation               End of Session Equipment Utilized During Treatment: Gait belt;Left knee immobilizer Activity Tolerance: Patient tolerated treatment well Patient left: in chair;with call bell/phone within reach Nurse Communication: Mobility status         Time: [id number] PT Time Calculation (min) (ACUTE ONLY): 25 min   Charges:   PT Evaluation $Initial PT Evaluation Tier I: 1 Procedure PT Treatments $Therapeutic Exercise: 8-22 mins   PT G Codes:        WILLIAMS,TARA 2015/08/14, 10:20 AM

## 2015-07-29 NOTE — Discharge Instructions (Addendum)

## 2015-07-29 NOTE — NC FL2 (Signed)
[place] LEVEL OF CARE SCREENING TOOL     IDENTIFICATION  Patient Name: Alyssa Jefferson Birthdate: 01-31-1946 Sex: female Admission Date (Current Location): 07/28/2015  [facility] and [place] Number:     Facility and Address:  [facility],  [place] [address] [place]      Provider Number: [id number]  Attending Physician Name and Address:  Gaynelle Arabian, MD  Relative Name and Phone Number:       Current Level of Care: Hospital Recommended Level of Care: [place] Prior Approval Number:    Date Approved/Denied:   PASRR Number:    Discharge Plan: SNF    Current Diagnoses: Patient Active Problem List   Diagnosis Date Noted  . OA (osteoarthritis) of knee 07/28/2015  . Palpitations 10/17/2014  . OSA (obstructive sleep apnea) 10/16/2012  . BMI 40.0-44.9, adult ([place]) 12/20/2011  . Hypothyroid 12/20/2011  . 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 Osteoarthritis, knee 12/20/2011  . Other and unspecified hyperlipidemia 12/20/2011  . Depressive disorder, not elsewhere classified 12/20/2011  . Unspecified essential hypertension 12/20/2011  . Anemia 12/20/2011  . Cancer ([place])     Orientation RESPIRATION BLADDER Height & Weight    Self, Time, Situation, Place  Normal Continent 5\' 2"  (157.5 cm) 239 lbs.  BEHAVIORAL SYMPTOMS/MOOD NEUROLOGICAL BOWEL NUTRITION STATUS   (no behaviors)   Continent Diet  AMBULATORY STATUS COMMUNICATION OF NEEDS Skin   Extensive Assist Verbally Surgical wounds                       Personal Care Assistance Level of Assistance  Bathing, Feeding, Dressing Bathing Assistance: Limited assistance Feeding assistance: Independent Dressing Assistance: Limited assistance     Functional Limitations Info             SPECIAL CARE FACTORS FREQUENCY  PT (By licensed PT), OT (By licensed OT)     PT Frequency: 6 - 7 x wk OT Frequency: 6-7 x wk             Contractures Contractures Info: Not present    Additional Factors Info  Code Status Code Status Info: Full Code             Current Medications (07/29/2015):  This is the current hospital active medication list Current Facility-Administered Medications  Medication Dose Route Frequency Provider Last Rate Last Dose  . acetaminophen (TYLENOL) tablet 650 mg  650 mg Oral Q6H PRN Gaynelle Arabian, MD       Or  . acetaminophen (TYLENOL) suppository 650 mg  650 mg Rectal Q6H PRN Gaynelle Arabian, MD      . acetaminophen (TYLENOL) tablet 1,000 mg  1,000 mg Oral Q6H Gaynelle Arabian, MD   1,000 mg at 07/29/15 [phone]  . albuterol (PROVENTIL) (2.5 MG/3ML) 0.083% nebulizer solution 2.5 mg  2.5 mg Nebulization Q6H PRN Gaynelle Arabian, MD      . apixaban Arne Cleveland) tablet 2.5 mg  2.5 mg Oral Q12H Gaynelle Arabian, MD      . bisacodyl (DULCOLAX) suppository 10 mg  10 mg Rectal Daily PRN Gaynelle Arabian, MD      . dexamethasone (DECADRON) injection 10 mg  10 mg Intravenous Once Gaynelle Arabian, MD      . dextrose 5 %-0.9 % sodium chloride infusion   Intravenous Continuous Arlee Muslim, PA-C 75 mL/hr at 07/29/15 0636    . diphenhydrAMINE (BENADRYL) 12.5 MG/5ML elixir 12.5-25 mg  12.5-25 mg Oral Q4H PRN Gaynelle Arabian,  MD      . docusate sodium (COLACE) capsule 100 mg  100 mg Oral BID Gaynelle Arabian, MD   100 mg at 07/28/15 2156  . fluticasone (FLONASE) 50 MCG/ACT nasal spray 1 spray  1 spray Each Nare Daily PRN Gaynelle Arabian, MD      . irbesartan (AVAPRO) tablet 300 mg  300 mg Oral Daily Gaynelle Arabian, MD       And  . hydrochlorothiazide (MICROZIDE) capsule 12.5 mg  12.5 mg Oral Daily Gaynelle Arabian, MD      . latanoprost (XALATAN) 0.005 % ophthalmic solution 1 drop  1 drop Right Eye QHS Gaynelle Arabian, MD   1 drop at 07/28/15 2157  . levothyroxine (SYNTHROID, LEVOTHROID) tablet 100 mcg  100 mcg Oral QAC breakfast Gaynelle Arabian, MD      . menthol-cetylpyridinium (CEPACOL) lozenge 3 mg  1 lozenge Oral PRN Gaynelle Arabian, MD        Or  . phenol (CHLORASEPTIC) mouth spray 1 spray  1 spray Mouth/Throat PRN Gaynelle Arabian, MD      . methocarbamol (ROBAXIN) tablet 500 mg  500 mg Oral Q6H PRN Gaynelle Arabian, MD       Or  . methocarbamol (ROBAXIN) 500 mg in dextrose 5 % 50 mL IVPB  500 mg Intravenous Q6H PRN Gaynelle Arabian, MD   500 mg at 07/28/15 1609  . metoCLOPramide (REGLAN) tablet 5-10 mg  5-10 mg Oral Q8H PRN Gaynelle Arabian, MD       Or  . metoCLOPramide (REGLAN) injection 5-10 mg  5-10 mg Intravenous Q8H PRN Gaynelle Arabian, MD      . morphine 2 MG/ML injection 1 mg  1 mg Intravenous Q2H PRN Gaynelle Arabian, MD   1 mg at 07/28/15 1555  . omeprazole (PRILOSEC) capsule 20 mg  20 mg Oral Daily Gaynelle Arabian, MD      . ondansetron [facility]) tablet 4 mg  4 mg Oral Q6H PRN Gaynelle Arabian, MD       Or  . ondansetron [facility]) injection 4 mg  4 mg Intravenous Q6H PRN Gaynelle Arabian, MD   4 mg at 07/29/15 0900  . oxyCODONE (Oxy IR/ROXICODONE) immediate release tablet 5-10 mg  5-10 mg Oral Q3H PRN Gaynelle Arabian, MD   10 mg at 07/29/15 [id number]  . polyethylene glycol (MIRALAX / GLYCOLAX) packet 17 g  17 g Oral Daily PRN Gaynelle Arabian, MD      . rosuvastatin (CRESTOR) tablet 10 mg  10 mg Oral QHS Gaynelle Arabian, MD   10 mg at 07/28/15 2157  . sodium phosphate (FLEET) 7-19 GM/118ML enema 1 enema  1 enema Rectal Once PRN Gaynelle Arabian, MD      . traMADol Veatrice Bourbon) tablet 50-100 mg  50-100 mg Oral Q6H PRN Gaynelle Arabian, MD         Discharge Medications: Please see discharge summary for a list of discharge medications.  Relevant Imaging Results:  Relevant Lab Results:   Additional Information SS # [id number]  Haidinger, Randall An, LCSW

## 2015-07-30 DIAGNOSIS — R2681 Unsteadiness on feet: Secondary | ICD-10-CM | POA: Diagnosis not present

## 2015-07-30 DIAGNOSIS — Z96652 Presence of left artificial knee joint: Secondary | ICD-10-CM | POA: Diagnosis not present

## 2015-07-30 DIAGNOSIS — M1712 Unilateral primary osteoarthritis, left knee: Secondary | ICD-10-CM | POA: Diagnosis not present

## 2015-07-30 DIAGNOSIS — R278 Other lack of coordination: Secondary | ICD-10-CM | POA: Diagnosis not present

## 2015-07-30 DIAGNOSIS — R262 Difficulty in walking, not elsewhere classified: Secondary | ICD-10-CM | POA: Diagnosis not present

## 2015-07-30 DIAGNOSIS — I10 Essential (primary) hypertension: Secondary | ICD-10-CM | POA: Diagnosis not present

## 2015-07-30 DIAGNOSIS — K219 Gastro-esophageal reflux disease without esophagitis: Secondary | ICD-10-CM | POA: Diagnosis not present

## 2015-07-30 DIAGNOSIS — K5900 Constipation, unspecified: Secondary | ICD-10-CM | POA: Diagnosis not present

## 2015-07-30 DIAGNOSIS — D509 Iron deficiency anemia, unspecified: Secondary | ICD-10-CM | POA: Diagnosis not present

## 2015-07-30 DIAGNOSIS — N289 Disorder of kidney and ureter, unspecified: Secondary | ICD-10-CM | POA: Diagnosis not present

## 2015-07-30 DIAGNOSIS — E039 Hypothyroidism, unspecified: Secondary | ICD-10-CM | POA: Diagnosis not present

## 2015-07-30 DIAGNOSIS — D72829 Elevated white blood cell count, unspecified: Secondary | ICD-10-CM | POA: Diagnosis not present

## 2015-07-30 DIAGNOSIS — M25562 Pain in left knee: Secondary | ICD-10-CM | POA: Diagnosis not present

## 2015-07-30 DIAGNOSIS — Z4789 Encounter for other orthopedic aftercare: Secondary | ICD-10-CM | POA: Diagnosis not present

## 2015-07-30 DIAGNOSIS — D62 Acute posthemorrhagic anemia: Secondary | ICD-10-CM | POA: Diagnosis not present

## 2015-07-30 DIAGNOSIS — M6281 Muscle weakness (generalized): Secondary | ICD-10-CM | POA: Diagnosis not present

## 2015-07-30 LAB — BASIC METABOLIC PANEL
Anion gap: 7 (ref 5–15)
BUN: 29 mg/dL — ABNORMAL HIGH (ref 6–20)
CO2: 26 mmol/L (ref 22–32)
Calcium: 8.8 mg/dL — ABNORMAL LOW (ref 8.9–10.3)
Chloride: 107 mmol/L (ref 101–111)
Creatinine, Ser: 1.53 mg/dL — ABNORMAL HIGH (ref 0.44–1.00)
GFR calc Af Amer: 39 mL/min — ABNORMAL LOW (ref >60)
GFR calc non Af Amer: 34 mL/min — ABNORMAL LOW (ref >60)
Glucose, Bld: 130 mg/dL — ABNORMAL HIGH (ref 65–99)
Potassium: 4.1 mmol/L (ref 3.5–5.1)
Sodium: 140 mmol/L (ref 135–145)

## 2015-07-30 LAB — CBC
HCT: 29.5 % — ABNORMAL LOW (ref 36.0–46.0)
Hemoglobin: 9.3 g/dL — ABNORMAL LOW (ref 12.0–15.0)
MCH: 25.6 pg — ABNORMAL LOW (ref 26.0–34.0)
MCHC: 31.5 g/dL (ref 30.0–36.0)
MCV: 81.3 fL (ref 78.0–100.0)
Platelets: 279 10*3/uL (ref 150–400)
RBC: 3.63 MIL/uL — ABNORMAL LOW (ref 3.87–5.11)
RDW: 14.3 % (ref 11.5–15.5)
WBC: 12.4 10*3/uL — ABNORMAL HIGH (ref 4.0–10.5)

## 2015-07-30 NOTE — Progress Notes (Signed)
   Subjective: 2 Days Post-Op Procedure(s) (LRB): LEFT TOTAL KNEE ARTHROPLASTY (Left) Patient reports pain as mild.   Patient seen in rounds by Dr. Wynelle Link. Patient is well, and has had no acute complaints or problems Patient is ready to go to the SNF  Objective: Vital signs in last 24 hours: Temp:  [97.8 F (36.6 C)-98.5 F (36.9 C)] 97.8 F (36.6 C) (12/14 0538) Pulse Rate:  [56-71] 60 (12/14 0538) Resp:  [16-18] 16 (12/14 0538) BP: (121-141)/(51-58) 139/57 mmHg (12/14 0538) SpO2:  [97 %-100 %] 99 % (12/14 0538)  Intake/Output from previous day:  Intake/Output Summary (Last 24 hours) at 07/30/15 0719 Last data filed at 07/30/15 0539  Gross per 24 hour  Intake 1123.75 ml  Output    800 ml  Net 323.75 ml    Labs:  Recent Labs  07/29/15 0511 07/30/15 0410  HGB 10.1* 9.3*    Recent Labs  07/29/15 0511 07/30/15 0410  WBC 10.0 12.4*  RBC 3.96 3.63*  HCT 31.8* 29.5*  PLT 290 279    Recent Labs  07/29/15 0511 07/30/15 0410  NA 138 140  K 4.1 4.1  CL 105 107  CO2 27 26  BUN 22* 29*  CREATININE 1.34* 1.53*  GLUCOSE 162* 130*  CALCIUM 9.0 8.8*   No results for input(s): LABPT, INR in the last 72 hours.  EXAM: General - Patient is Alert and Appropriate Extremity - Neurovascular intact Sensation intact distally Dorsiflexion/Plantar flexion intact Incision - clean, dry, no drainage Motor Function - intact, moving foot and toes well on exam.   Assessment/Plan: 2 Days Post-Op Procedure(s) (LRB): LEFT TOTAL KNEE ARTHROPLASTY (Left) Procedure(s) (LRB): LEFT TOTAL KNEE ARTHROPLASTY (Left) Past Medical History  Diagnosis Date  . High cholesterol   . Hypertension   . Depression   . Cancer [facility]) 2004    breast  . Glaucoma   . Thyroid disease     thyroid removed  . Hypothyroidism   . GERD (gastroesophageal reflux disease)   . Arthritis     knees, back  . Heart palpitations   . Nocturia   . History of transfusion     many years ago  . OSA  (obstructive sleep apnea)     tried CPAP- but unsuccessful  . Itching in the vaginal area    Principal Problem:   Osteoarthritis, knee Active Problems:   OA (osteoarthritis) of knee  Estimated body mass index is 43.7 kg/(m^2) as calculated from the following:   Height as of this encounter: 5\' 2"  (1.575 m).   Weight as of this encounter: 108.41 kg (239 lb). Up with therapy Discharge to SNF Diet - Cardiac diet Follow up - in 2 weeks Activity - WBAT Disposition - Home Condition Upon Discharge - Good D/C Meds - See DC Summary DVT Prophylaxis - Eliquis  Arlee Muslim, PA-C Orthopaedic Surgery 07/30/2015, 7:19 AM

## 2015-08-01 ENCOUNTER — Non-Acute Institutional Stay (SKILLED_NURSING_FACILITY): Payer: Medicare Other | Admitting: Internal Medicine

## 2015-08-01 DIAGNOSIS — M1712 Unilateral primary osteoarthritis, left knee: Secondary | ICD-10-CM

## 2015-08-01 DIAGNOSIS — E039 Hypothyroidism, unspecified: Secondary | ICD-10-CM | POA: Diagnosis not present

## 2015-08-01 DIAGNOSIS — N289 Disorder of kidney and ureter, unspecified: Secondary | ICD-10-CM

## 2015-08-01 DIAGNOSIS — I10 Essential (primary) hypertension: Secondary | ICD-10-CM | POA: Diagnosis not present

## 2015-08-01 DIAGNOSIS — K219 Gastro-esophageal reflux disease without esophagitis: Secondary | ICD-10-CM | POA: Diagnosis not present

## 2015-08-01 DIAGNOSIS — R2681 Unsteadiness on feet: Secondary | ICD-10-CM

## 2015-08-01 DIAGNOSIS — D62 Acute posthemorrhagic anemia: Secondary | ICD-10-CM | POA: Diagnosis not present

## 2015-08-01 DIAGNOSIS — K5900 Constipation, unspecified: Secondary | ICD-10-CM | POA: Diagnosis not present

## 2015-08-01 DIAGNOSIS — D72829 Elevated white blood cell count, unspecified: Secondary | ICD-10-CM

## 2015-08-02 NOTE — Progress Notes (Signed)
Patient ID: Alyssa Jefferson, female   DOB: April 28, 1946, 69 y.o.   MRN: [id number]     [facility]   PCP: DAUB, STEVE A, MD  Code Status: full code  Allergies  Allergen Reactions  . Codeine Other (See Comments)    confusion  . Lipitor [Atorvastatin Calcium] Itching    Chief Complaint  Patient presents with  . New Admit To SNF     HPI:  69 y.o. patient is here for short term rehabilitation post hospital admission from 07/28/15-07/30/15 with left knee OA. She underwent left total knee arthroplasty. She is seen in her room today. Her pain is under control with current pain regimen. She had a bowel movement yesterday. She has been working with therapy team.   Review of Systems:  Constitutional: Negative for fever, chills, diaphoresis.  HENT: Negative for headache, congestion, nasal discharge, difficulty swallowing.   Eyes: Negative for eye pain, blurred vision, double vision and discharge.  Respiratory: Negative for cough, shortness of breath and wheezing.   Cardiovascular: Negative for chest pain, palpitations.  Gastrointestinal: Negative for heartburn, nausea, vomiting, abdominal pain. Genitourinary: Negative for dysuria and flank pain.  Musculoskeletal: Negative for back pain, falls. Skin: Negative for itching  Neurological: Negative for dizziness, tingling, focal weakness Psychiatric/Behavioral: Negative for depression.    Past Medical History  Diagnosis Date  . High cholesterol   . Hypertension   . Depression   . Cancer [facility]) 2004    breast  . Glaucoma   . Thyroid disease     thyroid removed  . Hypothyroidism   . GERD (gastroesophageal reflux disease)   . Arthritis     knees, back  . Heart palpitations   . Nocturia   . History of transfusion     many years ago  . OSA (obstructive sleep apnea)     tried CPAP- but unsuccessful  . Itching in the vaginal area    Past Surgical History  Procedure Laterality Date  . Abdominal  hysterectomy  69    BSO  . Thyroid removed      due to knot  . Knee arthroscopy Left   . Breast surgery  2004    left breast removed due to cancer no chemo or radiation  . Mastectomy      LEFT  . Total knee arthroplasty Left 07/28/2015    Procedure: LEFT TOTAL KNEE ARTHROPLASTY;  Surgeon: Gaynelle Arabian, MD;  Location: [facility] ORS;  Service: Orthopedics;  Laterality: Left;   Social History:   reports that she quit smoking about 36 years ago. Her smoking use included Cigarettes. She quit after 20 years of use. She has never used smokeless tobacco. She reports that she does not drink alcohol or use illicit drugs.  Family History  Problem Relation Age of Onset  . Lung cancer Father   . Breast cancer Sister   . Ovarian cancer Sister     Medications:   Medication List       This list is accurate as of: 08/01/15 11:59 PM.  Always use your most recent med list.               acetaminophen 325 MG tablet  Commonly known as:  TYLENOL  Take 2 tablets (650 mg total) by mouth every 6 (six) hours as needed for mild pain (or Fever >/= 101).     albuterol 108 (90 BASE) MCG/ACT inhaler  Commonly known as:  PROVENTIL HFA;VENTOLIN HFA  Inhale 2 puffs  into the lungs every 6 (six) hours as needed for wheezing or shortness of breath.     apixaban 2.5 MG Tabs tablet  Commonly known as:  ELIQUIS  Take 1 tablet (2.5 mg total) by mouth every 12 (twelve) hours. Take Eliquis twice a day for two and a half more weeks, then discontinue Eliquis. Once the patient has completed the blood thinner regimen, then take a Baby 81 mg Aspirin daily for three more weeks.     Azelastine HCl 0.15 % Soln  Place 2 sprays into the nose 2 (two) times daily.     bisacodyl 10 MG suppository  Commonly known as:  DULCOLAX  Place 1 suppository (10 mg total) rectally daily as needed for moderate constipation.     clotrimazole 1 % vaginal cream  Commonly known as:  GYNE-LOTRIMIN  Place 1 Applicatorful vaginally at bedtime.      diphenhydrAMINE 12.5 MG/5ML elixir  Commonly known as:  BENADRYL  Take 5-10 mLs (12.5-25 mg total) by mouth every 4 (four) hours as needed for itching.     docusate sodium 100 MG capsule  Commonly known as:  COLACE  Take 1 capsule (100 mg total) by mouth 2 (two) times daily.     fluticasone 50 MCG/ACT nasal spray  Commonly known as:  FLONASE  Place 1 spray into both nostrils daily as needed for allergies.     latanoprost 0.005 % ophthalmic solution  Commonly known as:  XALATAN  Place 1 drop into the right eye at bedtime.     levothyroxine 100 MCG tablet  Commonly known as:  SYNTHROID  Take 1 tablet (100 mcg total) by mouth daily before breakfast.     methocarbamol 500 MG tablet  Commonly known as:  ROBAXIN  Take 1 tablet (500 mg total) by mouth every 6 (six) hours as needed for muscle spasms.     metoCLOPramide 5 MG tablet  Commonly known as:  REGLAN  Take 1 tablet (5 mg total) by mouth every 8 (eight) hours as needed for nausea (if ondansetron (ZOFRAN) ineffective.).     omeprazole 20 MG capsule  Commonly known as:  PRILOSEC  Take 1 capsule (20 mg total) by mouth daily.     ondansetron 4 MG tablet  Commonly known as:  ZOFRAN  Take 1 tablet (4 mg total) by mouth every 6 (six) hours as needed for nausea.     oxyCODONE 5 MG immediate release tablet  Commonly known as:  Oxy IR/ROXICODONE  Take 1-2 tablets (5-10 mg total) by mouth every 3 (three) hours as needed for moderate pain or severe pain.     polyethylene glycol packet  Commonly known as:  MIRALAX / GLYCOLAX  Take 17 g by mouth daily as needed for mild constipation or moderate constipation.     rosuvastatin 10 MG tablet  Commonly known as:  CRESTOR  Take 10 mg by mouth at bedtime.     sodium phosphate 7-19 GM/118ML Enem  Place 133 mLs (1 enema total) rectally once as needed for severe constipation.     traMADol 50 MG tablet  Commonly known as:  ULTRAM  Take 1-2 tablets (50-100 mg total) by mouth every 6  (six) hours as needed (mild pain).     valsartan-hydrochlorothiazide 320-12.5 MG tablet  Commonly known as:  DIOVAN-HCT  Take 1 tablet by mouth daily.         Physical Exam: Filed Vitals:   08/01/15 1645  BP: 130/60  Pulse: 81  Temp: 97.7  F (36.5 C)  Resp: 18  SpO2: 95%    General- elderly female, in no acute distress Head- normocephalic, atraumatic Nose- normal nasal mucosa, no maxillary or frontal sinus tenderness, no nasal discharge Throat- moist mucus membrane  Eyes- PERRLA, EOMI, no pallor, no icterus, no discharge, normal conjunctiva, normal sclera Neck- no cervical lymphadenopathy Cardiovascular- normal s1,s2, no murmurs, palpable dorsalis pedis and radial pulses, 1+ leg edema Respiratory- bilateral clear to auscultation, no wheeze, no rhonchi, no crackles, no use of accessory muscles Abdomen- bowel sounds present, soft, non tender Musculoskeletal- able to move all 4 extremities, limited left knee range of motion, has ted hose Neurological- no focal deficit, alert and oriented to person, place and time Skin- warm and dry, left knee surgical incision with steristrips in place and healing well Psychiatry- normal mood and affect    Labs reviewed: Basic Metabolic Panel:  Recent Labs  07/21/15 1110 07/29/15 0511 07/30/15 0410  NA 140 138 140  K 4.2 4.1 4.1  CL 104 105 107  CO2 26 27 26   GLUCOSE 88 162* 130*  BUN 27* 22* 29*  CREATININE 1.31* 1.34* 1.53*  CALCIUM 9.4 9.0 8.8*   Liver Function Tests:  Recent Labs  09/19/14 1130 04/28/15 1514 07/21/15 1110  AST 13 12 17   ALT 10 11 15   ALKPHOS 77 79 73  BILITOT 0.8 0.9 0.7  PROT 6.6 7.0 7.8  ALBUMIN 3.6 3.8 3.9   No results for input(s): LIPASE, AMYLASE in the last 8760 hours. No results for input(s): AMMONIA in the last 8760 hours. CBC:  Recent Labs  07/21/15 1110 07/29/15 0511 07/30/15 0410  WBC 5.5 10.0 12.4*  HGB 11.0* 10.1* 9.3*  HCT 34.4* 31.8* 29.5*  MCV 80.0 80.3 81.3  PLT 319  290 279    Assessment/Plan  Unsteady gait S/p left total knee replacement surgery. Will have patient work with PT/OT as tolerated to regain strength and restore function.  Fall precautions are in place.  Left knee OA S/p left TKA. Will have her work with physical therapy and occupational therapy team to help with gait training and muscle strengthening exercises.fall precautions. Skin care. Encourage to be out of bed. Has f/u with Dr Wynelle Link. Continue oxyIR 5 mg 1-2 tab q3h prn pain and tramadol 50 mg 1-2 tab q6h prn pain. Continue robaxin 500 mg q6h prn muscle spasm. Continue eliquis for dvt prophylaxis. WBAT. Continue ted hose for leg edema  Acute blood loss anemia Post op, monitor cbc  Leukocytosis No signs of infection. Likely reactive leukocytosis. Monitor cbc  Constipation Continue colace 100 mg bid and prn miralax and dulcolax suppository  Hypothyroidism Continue synthorid 100 mcg daily  gerd Stable on prilosec 20 mg daily  HTN Stable BP, monitor BP reading. Continue valsartan-hctz 320-12.5 mg daily.  Impaired renal function Monitor BMP   Goals of care: short term rehabilitation   Labs/tests ordered: cbc, bmp  Family/ staff Communication: reviewed care plan with patient and nursing supervisor    Blanchie Serve, MD  [facility] [phone] (Monday-Friday 8 am - 5 pm) [phone] (afterhours)

## 2015-08-07 ENCOUNTER — Non-Acute Institutional Stay (SKILLED_NURSING_FACILITY): Payer: Medicare Other | Admitting: Internal Medicine

## 2015-08-07 DIAGNOSIS — K5900 Constipation, unspecified: Secondary | ICD-10-CM

## 2015-08-07 DIAGNOSIS — I10 Essential (primary) hypertension: Secondary | ICD-10-CM

## 2015-08-07 DIAGNOSIS — D62 Acute posthemorrhagic anemia: Secondary | ICD-10-CM | POA: Diagnosis not present

## 2015-08-07 DIAGNOSIS — E039 Hypothyroidism, unspecified: Secondary | ICD-10-CM | POA: Diagnosis not present

## 2015-08-07 DIAGNOSIS — K219 Gastro-esophageal reflux disease without esophagitis: Secondary | ICD-10-CM

## 2015-08-07 DIAGNOSIS — M1712 Unilateral primary osteoarthritis, left knee: Secondary | ICD-10-CM

## 2015-08-07 NOTE — Progress Notes (Signed)
Patient ID: Alyssa Jefferson, female   DOB: [birth year], 69 y.o.   MRN: [id number]     [facility]   PCP: DAUB, STEVE A, MD  Code Status: full code  Allergies  Allergen Reactions  . Codeine Other (See Comments)    confusion  . Lipitor [Atorvastatin Calcium] Itching    Chief Complaint  Patient presents with  . Discharge Note    discharge visit     HPI:  69 y.o. patient is seen for discharge visit. She is here for short term rehabilitation post hospital admission from 07/28/15-07/30/15 with left knee OA s/p left total knee arthroplasty. She is seen in her room today. Her pain is under control with current pain regimen. She had a bowel movement today. She has been working with therapy team.   Review of Systems:  Constitutional: Negative for fever, chills, diaphoresis.  Respiratory: Negative for cough, shortness of breath and wheezing.   Cardiovascular: Negative for chest pain, palpitations.  Gastrointestinal: Negative for heartburn, nausea, vomiting, abdominal pain. Genitourinary: Negative for dysuria and flank pain.  Musculoskeletal: Negative for back pain, falls. Skin: Negative for itching     Past Medical History  Diagnosis Date  . High cholesterol   . Hypertension   . Depression   . Cancer [facility]) 2004    breast  . Glaucoma   . Thyroid disease     thyroid removed  . Hypothyroidism   . GERD (gastroesophageal reflux disease)   . Arthritis     knees, back  . Heart palpitations   . Nocturia   . History of transfusion     many years ago  . OSA (obstructive sleep apnea)     tried CPAP- but unsuccessful  . Itching in the vaginal area    Past Surgical History  Procedure Laterality Date  . Abdominal hysterectomy  82    BSO  . Thyroid removed      due to knot  . Knee arthroscopy Left   . Breast surgery  2004    left breast removed due to cancer no chemo or radiation  . Mastectomy      LEFT  . Total knee arthroplasty Left  07/28/2015    Procedure: LEFT TOTAL KNEE ARTHROPLASTY;  Surgeon: Gaynelle Arabian, MD;  Location: [facility];  Service: Orthopedics;  Laterality: Left;   Social History:   reports that she quit smoking about 37 years ago. Her smoking use included Cigarettes. She quit after 20 years of use. She has never used smokeless tobacco. She reports that she does not drink alcohol or use illicit drugs.  Family History  Problem Relation Age of Onset  . Lung cancer Father   . Breast cancer Sister   . Ovarian cancer Sister     Medications:   Medication List       This list is accurate as of: 08/07/15  5:09 PM.  Always use your most recent med list.               acetaminophen 325 MG tablet  Commonly known as:  TYLENOL  Take 2 tablets (650 mg total) by mouth every 6 (six) hours as needed for mild pain (or Fever >/= 101).     albuterol 108 (90 BASE) MCG/ACT inhaler  Commonly known as:  PROVENTIL HFA;VENTOLIN HFA  Inhale 2 puffs into the lungs every 6 (six) hours as needed for wheezing or shortness of breath.     apixaban 2.5 MG Tabs tablet  Commonly known as:  ELIQUIS  Take 1 tablet (2.5 mg total) by mouth every 12 (twelve) hours. Take Eliquis twice a day for two and a half more weeks, then discontinue Eliquis. Once the patient has completed the blood thinner regimen, then take a Baby 81 mg Aspirin daily for three more weeks.     Azelastine HCl 0.15 % Soln  Place 2 sprays into the nose 2 (two) times daily.     bisacodyl 10 MG suppository  Commonly known as:  DULCOLAX  Place 1 suppository (10 mg total) rectally daily as needed for moderate constipation.     clotrimazole 1 % vaginal cream  Commonly known as:  GYNE-LOTRIMIN  Place 1 Applicatorful vaginally at bedtime.     diphenhydrAMINE 12.5 MG/5ML elixir  Commonly known as:  BENADRYL  Take 5-10 mLs (12.5-25 mg total) by mouth every 4 (four) hours as needed for itching.     docusate sodium 100 MG capsule  Commonly known as:  COLACE  Take  1 capsule (100 mg total) by mouth 2 (two) times daily.     fluticasone 50 MCG/ACT nasal spray  Commonly known as:  FLONASE  Place 1 spray into both nostrils daily as needed for allergies.     latanoprost 0.005 % ophthalmic solution  Commonly known as:  XALATAN  Place 1 drop into the right eye at bedtime.     levothyroxine 100 MCG tablet  Commonly known as:  SYNTHROID  Take 1 tablet (100 mcg total) by mouth daily before breakfast.     methocarbamol 500 MG tablet  Commonly known as:  ROBAXIN  Take 1 tablet (500 mg total) by mouth every 6 (six) hours as needed for muscle spasms.     metoCLOPramide 5 MG tablet  Commonly known as:  REGLAN  Take 1 tablet (5 mg total) by mouth every 8 (eight) hours as needed for nausea (if ondansetron (ZOFRAN) ineffective.).     omeprazole 20 MG capsule  Commonly known as:  PRILOSEC  Take 1 capsule (20 mg total) by mouth daily.     ondansetron 4 MG tablet  Commonly known as:  ZOFRAN  Take 1 tablet (4 mg total) by mouth every 6 (six) hours as needed for nausea.     oxyCODONE 5 MG immediate release tablet  Commonly known as:  Oxy IR/ROXICODONE  Take 1-2 tablets (5-10 mg total) by mouth every 3 (three) hours as needed for moderate pain or severe pain.     polyethylene glycol packet  Commonly known as:  MIRALAX / GLYCOLAX  Take 17 g by mouth daily as needed for mild constipation or moderate constipation.     rosuvastatin 10 MG tablet  Commonly known as:  CRESTOR  Take 10 mg by mouth at bedtime.     sodium phosphate 7-19 GM/118ML Enem  Place 133 mLs (1 enema total) rectally once as needed for severe constipation.     traMADol 50 MG tablet  Commonly known as:  ULTRAM  Take 1-2 tablets (50-100 mg total) by mouth every 6 (six) hours as needed (mild pain).     valsartan-hydrochlorothiazide 320-12.5 MG tablet  Commonly known as:  DIOVAN-HCT  Take 1 tablet by mouth daily.         Physical Exam: Filed Vitals:   08/07/15 1708  BP: 135/69    Pulse: 88  Temp: 98 F (36.7 C)  Resp: 18    General- elderly female, in no acute distress Head- normocephalic, atraumatic Eyes- PERRLA, EOMI, no pallor,  no icterus Neck- no cervical lymphadenopathy Cardiovascular- normal s1,s2, no murmurs, palpable dorsalis pedis and radial pulses, trace + leg edema Respiratory- bilateral clear to auscultation, no wheeze, no rhonchi, no crackles, no use of accessory muscles Abdomen- bowel sounds present, soft, non tender Musculoskeletal- able to move all 4 extremities, limited left knee range of motion, has ted hose Neurological- no focal deficit, alert and oriented to person, place and time Skin- warm and dry, left knee surgical incision with incision open to air and healing well Psychiatry- normal mood and affect    Labs reviewed: Basic Metabolic Panel:  Recent Labs  07/21/15 1110 07/29/15 0511 07/30/15 0410  NA 140 138 140  K 4.2 4.1 4.1  CL 104 105 107  CO2 26 27 26   GLUCOSE 88 162* 130*  BUN 27* 22* 29*  CREATININE 1.31* 1.34* 1.53*  CALCIUM 9.4 9.0 8.8*   CBC:  Recent Labs  07/21/15 1110 07/29/15 0511 07/30/15 0410  WBC 5.5 10.0 12.4*  HGB 11.0* 10.1* 9.3*  HCT 34.4* 31.8* 29.5*  MCV 80.0 80.3 81.3  PLT 319 290 279   08/01/15 na 141, k 3.7, bun 23, cr 1.26, ca 8.7  Assessment/Plan  Patient is stable to be discharged home with home health services to continue PT and OT to help with gait training and strengthening exercises. She will need bedside commode and rolling walker to help with her ADLs in a safe and effective way which cannot be provided by a cane at present. Scripts for her medication provided.  Left knee OA S/p left TKA. Has unsteady gait. Will have her work with physical therapy and occupational therapy team to help with gait training and muscle strengthening exercises.fall precautions. Skin care. Encourage to be out of bed. Continue oxyIR 5 mg 1-2 tab q3h prn pain and tramadol 50 mg 1-2 tab q6h prn pain.  Continue robaxin 500 mg q6h prn muscle spasm. Continue eliquis for dvt prophylaxis until 08/17/15 and then aspirin ec 81 mg daily until 09/08/15. WBAT. Continue ted hose for leg edema  Constipation Continue colace 100 mg bid and prn miralax and dulcolax suppository  Hypothyroidism Continue synthorid 100 mcg daily  gerd Stable on prilosec 20 mg daily  HTN Continue valsartan-hctz 320-12.5 mg daily  Acute blood loss anemia Post op, cbc has not been obtained since discharge, get cbc in am prior to discharge  Leukocytosis No signs of infection. Likely reactive leukocytosis at discharge. Get cbc with diff in am to assess further   Family/ staff Communication: reviewed care plan with patient and nursing supervisor    Blanchie Serve, MD  [place] [phone] (Monday-Friday 8 am - 5 pm) [phone] (afterhours)

## 2015-08-12 DIAGNOSIS — I10 Essential (primary) hypertension: Secondary | ICD-10-CM | POA: Diagnosis not present

## 2015-08-12 DIAGNOSIS — Z471 Aftercare following joint replacement surgery: Secondary | ICD-10-CM | POA: Diagnosis not present

## 2015-08-12 DIAGNOSIS — M4690 Unspecified inflammatory spondylopathy, site unspecified: Secondary | ICD-10-CM | POA: Diagnosis not present

## 2015-08-12 DIAGNOSIS — Z96652 Presence of left artificial knee joint: Secondary | ICD-10-CM | POA: Diagnosis not present

## 2015-08-14 DIAGNOSIS — Z96652 Presence of left artificial knee joint: Secondary | ICD-10-CM | POA: Diagnosis not present

## 2015-08-14 DIAGNOSIS — M4690 Unspecified inflammatory spondylopathy, site unspecified: Secondary | ICD-10-CM | POA: Diagnosis not present

## 2015-08-14 DIAGNOSIS — Z471 Aftercare following joint replacement surgery: Secondary | ICD-10-CM | POA: Diagnosis not present

## 2015-08-14 DIAGNOSIS — I10 Essential (primary) hypertension: Secondary | ICD-10-CM | POA: Diagnosis not present

## 2015-08-16 DIAGNOSIS — M4690 Unspecified inflammatory spondylopathy, site unspecified: Secondary | ICD-10-CM | POA: Diagnosis not present

## 2015-08-16 DIAGNOSIS — Z96652 Presence of left artificial knee joint: Secondary | ICD-10-CM | POA: Diagnosis not present

## 2015-08-16 DIAGNOSIS — Z471 Aftercare following joint replacement surgery: Secondary | ICD-10-CM | POA: Diagnosis not present

## 2015-08-16 DIAGNOSIS — I10 Essential (primary) hypertension: Secondary | ICD-10-CM | POA: Diagnosis not present

## 2015-08-17 DIAGNOSIS — I10 Essential (primary) hypertension: Secondary | ICD-10-CM | POA: Diagnosis not present

## 2015-08-17 DIAGNOSIS — M4690 Unspecified inflammatory spondylopathy, site unspecified: Secondary | ICD-10-CM | POA: Diagnosis not present

## 2015-08-17 DIAGNOSIS — Z96652 Presence of left artificial knee joint: Secondary | ICD-10-CM | POA: Diagnosis not present

## 2015-08-17 DIAGNOSIS — Z471 Aftercare following joint replacement surgery: Secondary | ICD-10-CM | POA: Diagnosis not present

## 2015-08-19 DIAGNOSIS — Z96652 Presence of left artificial knee joint: Secondary | ICD-10-CM | POA: Diagnosis not present

## 2015-08-19 DIAGNOSIS — M4690 Unspecified inflammatory spondylopathy, site unspecified: Secondary | ICD-10-CM | POA: Diagnosis not present

## 2015-08-19 DIAGNOSIS — Z471 Aftercare following joint replacement surgery: Secondary | ICD-10-CM | POA: Diagnosis not present

## 2015-08-19 DIAGNOSIS — I10 Essential (primary) hypertension: Secondary | ICD-10-CM | POA: Diagnosis not present

## 2015-08-20 DIAGNOSIS — I10 Essential (primary) hypertension: Secondary | ICD-10-CM | POA: Diagnosis not present

## 2015-08-20 DIAGNOSIS — Z471 Aftercare following joint replacement surgery: Secondary | ICD-10-CM | POA: Diagnosis not present

## 2015-08-20 DIAGNOSIS — M4690 Unspecified inflammatory spondylopathy, site unspecified: Secondary | ICD-10-CM | POA: Diagnosis not present

## 2015-08-20 DIAGNOSIS — Z96652 Presence of left artificial knee joint: Secondary | ICD-10-CM | POA: Diagnosis not present

## 2015-08-21 DIAGNOSIS — Z471 Aftercare following joint replacement surgery: Secondary | ICD-10-CM | POA: Diagnosis not present

## 2015-08-21 DIAGNOSIS — M4690 Unspecified inflammatory spondylopathy, site unspecified: Secondary | ICD-10-CM | POA: Diagnosis not present

## 2015-08-21 DIAGNOSIS — I10 Essential (primary) hypertension: Secondary | ICD-10-CM | POA: Diagnosis not present

## 2015-08-21 DIAGNOSIS — Z96652 Presence of left artificial knee joint: Secondary | ICD-10-CM | POA: Diagnosis not present

## 2015-08-26 DIAGNOSIS — M1712 Unilateral primary osteoarthritis, left knee: Secondary | ICD-10-CM | POA: Diagnosis not present

## 2015-08-28 DIAGNOSIS — I10 Essential (primary) hypertension: Secondary | ICD-10-CM | POA: Diagnosis not present

## 2015-08-28 DIAGNOSIS — G4733 Obstructive sleep apnea (adult) (pediatric): Secondary | ICD-10-CM | POA: Diagnosis not present

## 2015-08-28 DIAGNOSIS — N183 Chronic kidney disease, stage 3 (moderate): Secondary | ICD-10-CM | POA: Diagnosis not present

## 2015-08-28 DIAGNOSIS — I129 Hypertensive chronic kidney disease with stage 1 through stage 4 chronic kidney disease, or unspecified chronic kidney disease: Secondary | ICD-10-CM | POA: Diagnosis not present

## 2015-09-02 DIAGNOSIS — M1712 Unilateral primary osteoarthritis, left knee: Secondary | ICD-10-CM | POA: Diagnosis not present

## 2015-09-05 DIAGNOSIS — R0602 Shortness of breath: Secondary | ICD-10-CM | POA: Diagnosis not present

## 2015-09-05 DIAGNOSIS — E782 Mixed hyperlipidemia: Secondary | ICD-10-CM | POA: Diagnosis not present

## 2015-09-09 DIAGNOSIS — M1712 Unilateral primary osteoarthritis, left knee: Secondary | ICD-10-CM | POA: Diagnosis not present

## 2015-09-09 DIAGNOSIS — Z96652 Presence of left artificial knee joint: Secondary | ICD-10-CM | POA: Diagnosis not present

## 2015-09-09 DIAGNOSIS — Z471 Aftercare following joint replacement surgery: Secondary | ICD-10-CM | POA: Diagnosis not present

## 2015-09-09 IMAGING — CR DG LUMBAR SPINE 2-3V
3 series · 3 of 3 positions shown · non-contrast
Comparison: CT abdomen of 08/02/2014

CLINICAL DATA: Back pain

EXAM:
LUMBAR SPINE - 2-3 VIEW

[lateral (1 of 2)]
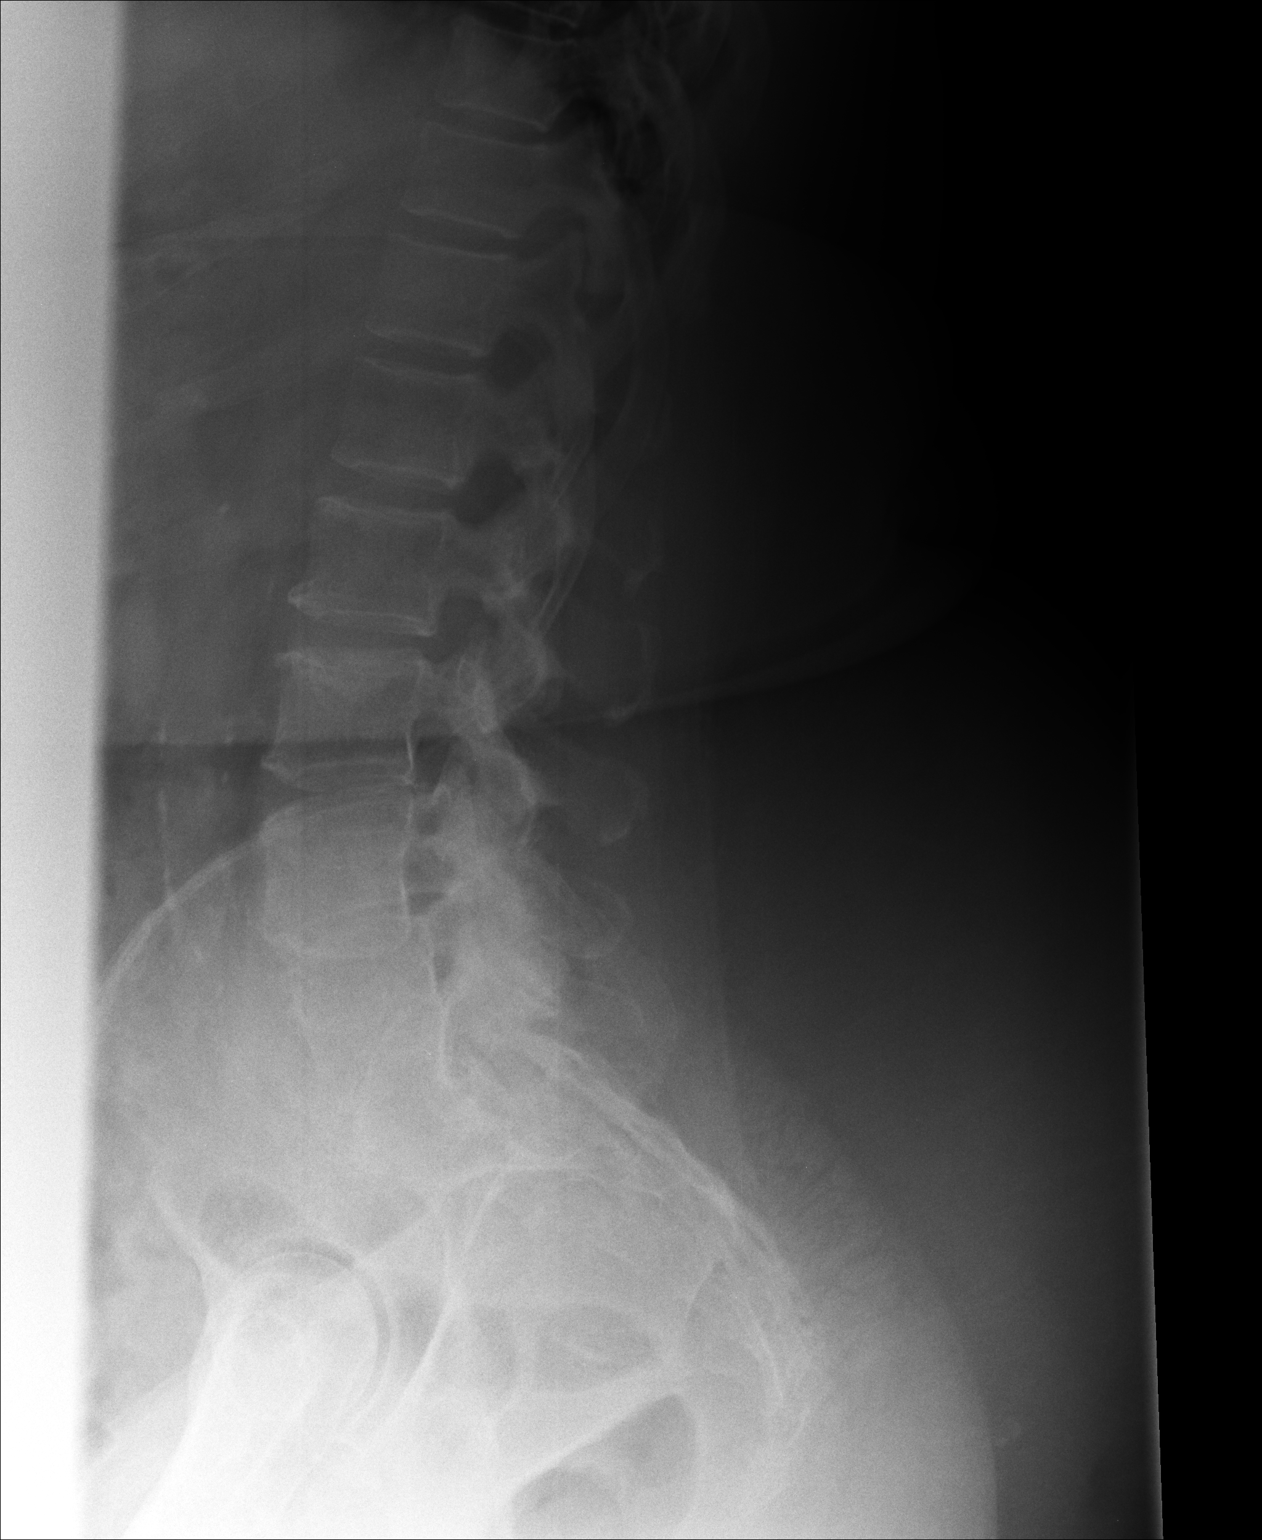

[lateral (2 of 2)]
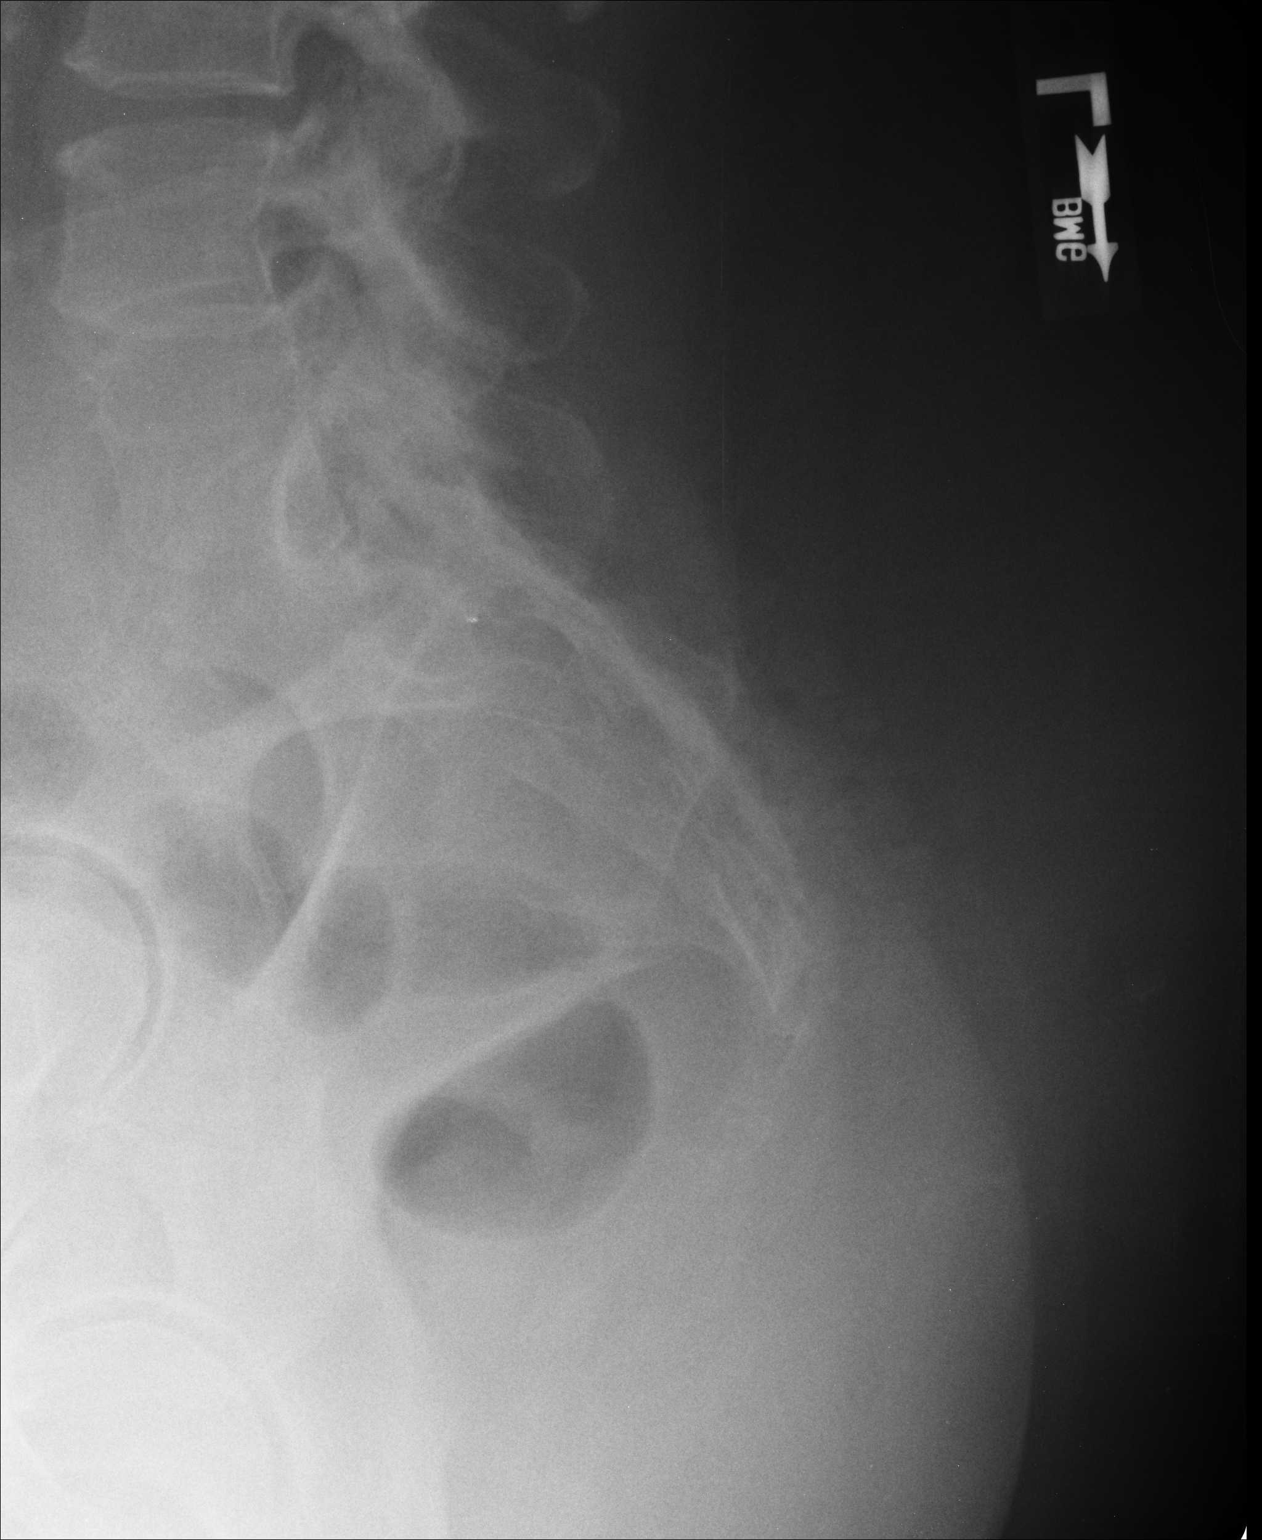

[AP]
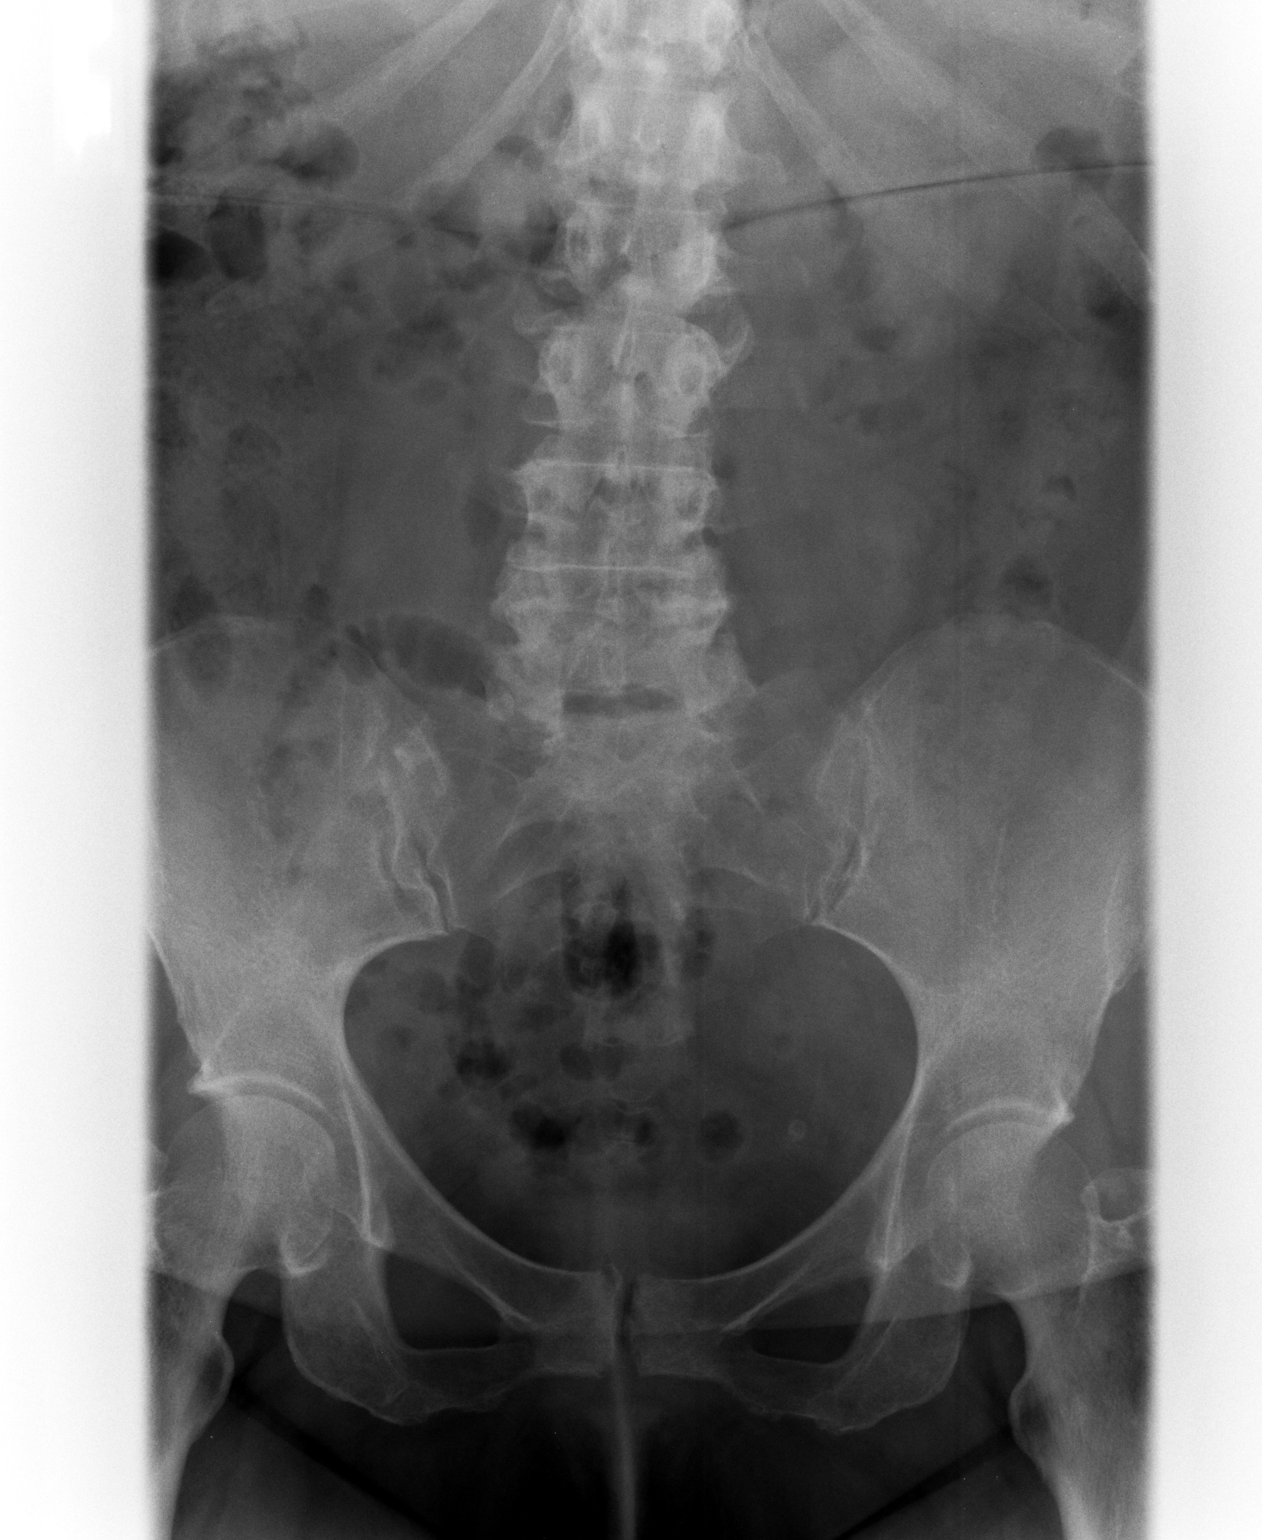

[3 of 3 positions shown; findings below may reference images not displayed]

FINDINGS: There is a mild 4 mm anterolisthesis of L4 on L5 most likely
degenerative in origin. There is mild degenerative disc disease at
L4-5 and L5-S1 levels. No compression deformity is seen. The SI
joints are corticated.
IMPRESSION: 1. No acute abnormality.
2. Mild 4 mm anterolisthesis of L4 on L5 most likely degenerative in
origin.
3. Mild degenerative disc disease at L4-5 and L5-S1.

## 2015-09-11 ENCOUNTER — Ambulatory Visit (INDEPENDENT_AMBULATORY_CARE_PROVIDER_SITE_OTHER): Payer: Medicare Other | Admitting: Family Medicine

## 2015-09-11 VITALS — BP 148/80 | HR 76 | Temp 98.6°F | Resp 18 | Ht 62.0 in | Wt 228.8 lb

## 2015-09-11 DIAGNOSIS — R109 Unspecified abdominal pain: Secondary | ICD-10-CM | POA: Diagnosis not present

## 2015-09-11 DIAGNOSIS — K5730 Diverticulosis of large intestine without perforation or abscess without bleeding: Secondary | ICD-10-CM

## 2015-09-11 DIAGNOSIS — R194 Change in bowel habit: Secondary | ICD-10-CM

## 2015-09-11 DIAGNOSIS — R198 Other specified symptoms and signs involving the digestive system and abdomen: Secondary | ICD-10-CM | POA: Diagnosis not present

## 2015-09-11 LAB — POCT CBC
Granulocyte percent: 69.1 %G (ref 37–80)
HCT, POC: 33.5 % — AB (ref 37.7–47.9)
Hemoglobin: 10.8 g/dL — AB (ref 12.2–16.2)
Lymph, poc: 1.8 (ref 0.6–3.4)
MCH, POC: 25.6 pg — AB (ref 27–31.2)
MCHC: 32.2 g/dL (ref 31.8–35.4)
MCV: 79.3 fL — AB (ref 80–97)
MID (cbc): 0.2 (ref 0–0.9)
MPV: 5.8 fL (ref 0–99.8)
POC Granulocyte: 4.5 (ref 2–6.9)
POC LYMPH PERCENT: 27.8 %L (ref 10–50)
POC MID %: 3.1 %M (ref 0–12)
Platelet Count, POC: 394 10*3/uL (ref 142–424)
RBC: 4.23 M/uL (ref 4.04–5.48)
RDW, POC: 15.4 %
WBC: 6.5 10*3/uL (ref 4.6–10.2)

## 2015-09-11 LAB — POCT URINALYSIS DIP (MANUAL ENTRY)
Bilirubin, UA: NEGATIVE
Blood, UA: NEGATIVE
Glucose, UA: NEGATIVE
Ketones, POC UA: NEGATIVE
Leukocytes, UA: NEGATIVE
Nitrite, UA: NEGATIVE
Protein Ur, POC: NEGATIVE
Spec Grav, UA: 1.025
Urobilinogen, UA: 0.2
pH, UA: 5

## 2015-09-11 LAB — POC MICROSCOPIC URINALYSIS (UMFC): Mucus: ABSENT

## 2015-09-11 NOTE — Progress Notes (Signed)
Subjective:  By signing my name below, I, Rawaa Al Rifaie, attest that this documentation has been prepared under the direction and in the presence of Merri Ray, MD.  Leandra Kern, Medical Scribe. 09/11/2015.  12:48 PM.  I personally performed the services described in this documentation, which was scribed in my presence. The recorded information has been reviewed and considered, and addended by me as needed.     Patient ID: Alyssa Jefferson, female    DOB: 1945-10-18, 70 y.o.   MRN: [id number]  Chief Complaint  Patient presents with  . Abdominal Pain    states her stomach has been hurting  . stool issue    states that when she goes to the restroom she feels as though she is not emptying her bowels    HPI HPI Comments: Alyssa Jefferson is a 70 y.o. female who presents to [facility] complaining of abdominal pain, onset for 1 month now.  Pt has recently had a knee surgery on 12/12, and followed by rehab of that knee. She was put on a course of antibiotics at the time. Pt had colonoscopy done in 07/2014 that showed Internal hemorrhoids and multiple diverticula. Pt reports that when she is experiencing increased frequency of bowel movement, about 4-5 timer per day, experienced for a bout a week now. She also reports associated bleeding of the anus due to the increased frequency. Pt notes having associated symptoms of an incomplete feeling of emptiness after a bowel movement, soft stool with some odor, and smelly breath with similar odor as the stool's. She denies diarrhea, dysruia, difficulty urinating, hematuria, fever, or vomiting.    Anemia: pt has a hx of anemia with post-op anemia, previous baseline between 10.1 and 11.3.     Patient Active Problem List   Diagnosis Date Noted  . OA (osteoarthritis) of knee 07/28/2015  . Palpitations 10/17/2014  . OSA (obstructive sleep apnea) 10/16/2012  . BMI 40.0-44.9, adult ([place]) 12/20/2011  . Hypothyroid 12/20/2011  .  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 Osteoarthritis, knee 12/20/2011  . Other and unspecified hyperlipidemia 12/20/2011  . Depressive disorder, not elsewhere classified 12/20/2011  . Unspecified essential hypertension 12/20/2011  . Anemia 12/20/2011  . Cancer [facility])    Past Medical History  Diagnosis Date  . High cholesterol   . Hypertension   . Depression   . Cancer [facility]) 2004    breast  . Glaucoma   . Thyroid disease     thyroid removed  . Hypothyroidism   . GERD (gastroesophageal reflux disease)   . Arthritis     knees, back  . Heart palpitations   . Nocturia   . History of transfusion     many years ago  . OSA (obstructive sleep apnea)     tried CPAP- but unsuccessful  . Itching in the vaginal area    Past Surgical History  Procedure Laterality Date  . Abdominal hysterectomy  82    BSO  . Thyroid removed      due to knot  . Knee arthroscopy Left   . Breast surgery  2004    left breast removed due to cancer no chemo or radiation  . Mastectomy      LEFT  . Total knee arthroplasty Left 07/28/2015    Procedure: LEFT TOTAL KNEE ARTHROPLASTY;  Surgeon: Gaynelle Arabian, MD;  Location: [facility];  Service: Orthopedics;  Laterality: Left;   Allergies  Allergen Reactions  .  Codeine Other (See Comments)    confusion  . Lipitor [Atorvastatin Calcium] Itching   Prior to Admission medications   Medication Sig Start Date End Date Taking? Authorizing Provider  albuterol (PROVENTIL HFA;VENTOLIN HFA) 108 (90 BASE) MCG/ACT inhaler Inhale 2 puffs into the lungs every 6 (six) hours as needed for wheezing or shortness of breath.   Yes Historical Provider, MD  apixaban (ELIQUIS) 2.5 MG TABS tablet Take 1 tablet (2.5 mg total) by mouth every 12 (twelve) hours. Take Eliquis twice a day for two and a half more weeks, then discontinue Eliquis. Once the patient has completed the blood thinner regimen, then take a Baby 81 mg Aspirin daily for three more weeks.  07/29/15  Yes Arlee Muslim, PA-C  Azelastine HCl 0.15 % SOLN Place 2 sprays into the nose 2 (two) times daily. 05/15/14  Yes Shawnee Knapp, MD  fluticasone (FLONASE) 50 MCG/ACT nasal spray Place 1 spray into both nostrils daily as needed for allergies.    Yes Historical Provider, MD  latanoprost (XALATAN) 0.005 % ophthalmic solution Place 1 drop into the right eye at bedtime.    Yes Historical Provider, MD  levothyroxine (SYNTHROID) 100 MCG tablet Take 1 tablet (100 mcg total) by mouth daily before breakfast. 02/06/15  Yes Darlyne Russian, MD  methocarbamol (ROBAXIN) 500 MG tablet Take 1 tablet (500 mg total) by mouth every 6 (six) hours as needed for muscle spasms. 07/29/15  Yes Arlee Muslim, PA-C  omeprazole (PRILOSEC) 20 MG capsule Take 1 capsule (20 mg total) by mouth daily. 07/01/15  Yes Darlyne Russian, MD  rosuvastatin (CRESTOR) 10 MG tablet Take 10 mg by mouth at bedtime.   Yes Historical Provider, MD  valsartan-hydrochlorothiazide (DIOVAN-HCT) 320-12.5 MG tablet Take 1 tablet by mouth daily.   Yes Historical Provider, MD  docusate sodium (COLACE) 100 MG capsule Take 1 capsule (100 mg total) by mouth 2 (two) times daily. Patient not taking: Reported on 09/11/2015 07/29/15   Arlee Muslim, PA-C  polyethylene glycol [facility] / Floria Raveling) packet Take 17 g by mouth daily as needed for mild constipation or moderate constipation. Patient not taking: Reported on 09/11/2015 07/29/15   Arlee Muslim, PA-C  traMADol (ULTRAM) 50 MG tablet Take 1-2 tablets (50-100 mg total) by mouth every 6 (six) hours as needed (mild pain). Patient not taking: Reported on 09/11/2015 07/29/15   Arlee Muslim, PA-C   Social History   Social History  . Marital Status: Widowed    Spouse Name: N/A  . Number of Children: N/A  . Years of Education: N/A   Occupational History  . retired    Social History Main Topics  . Smoking status: Former Smoker -- 20 years    Types: Cigarettes    Quit date: 08/16/1978  . Smokeless tobacco:  Never Used     Comment: 1 pack per week--10/16/12  . Alcohol Use: No  . Drug Use: No  . Sexual Activity: No   Other Topics Concern  . Not on file   Social History Narrative    Review of Systems  Constitutional: Negative for fever.  Gastrointestinal: Positive for abdominal pain and anal bleeding. Negative for vomiting, diarrhea and constipation.  Genitourinary: Negative for dysuria, hematuria and difficulty urinating.      Objective:   Physical Exam  Constitutional: She is oriented to person, place, and time. She appears well-developed and well-nourished. No distress.  HENT:  Head: Normocephalic and atraumatic.  Eyes: EOM are normal. Pupils are equal, round, and reactive to  light.  Neck: Neck supple.  Cardiovascular: Normal rate.   Pulmonary/Chest: Effort normal.  Abdominal: Bowel sounds are normal. There is no rebound, no guarding and no CVA tenderness.  Suprapubic tenderness.   Neurological: She is alert and oriented to person, place, and time. No cranial nerve deficit.  Skin: Skin is warm and dry.  Psychiatric: She has a normal mood and affect. Her behavior is normal.  Nursing note and vitals reviewed.   BP 148/80 mmHg  Pulse 76  Temp(Src) 98.6 F (37 C) (Oral)  Resp 18  Ht 5\' 2"  (1.575 m)  Wt 228 lb 12.8 oz (103.783 kg)  BMI 41.84 kg/m2  SpO2 98%   Results for orders placed or performed in visit on 09/11/15  POCT CBC  Result Value Ref Range   WBC 6.5 4.6 - 10.2 K/uL   Lymph, poc 1.8 0.6 - 3.4   POC LYMPH PERCENT 27.8 10 - 50 %L   MID (cbc) 0.2 0 - 0.9   POC MID % 3.1 0 - 12 %M   POC Granulocyte 4.5 2 - 6.9   Granulocyte percent 69.1 37 - 80 %G   RBC 4.23 4.04 - 5.48 M/uL   Hemoglobin 10.8 (A) 12.2 - 16.2 g/dL   HCT, POC 33.5 (A) 37.7 - 47.9 %   MCV 79.3 (A) 80 - 97 fL   MCH, POC 25.6 (A) 27 - 31.2 pg   MCHC 32.2 31.8 - 35.4 g/dL   RDW, POC 15.4 %   Platelet Count, POC 394 142 - 424 K/uL   MPV 5.8 0 - 99.8 fL  POCT urinalysis dipstick  Result Value  Ref Range   Color, UA yellow yellow   Clarity, UA clear clear   Glucose, UA negative negative   Bilirubin, UA negative negative   Ketones, POC UA negative negative   Spec Grav, UA 1.025    Blood, UA negative negative   pH, UA 5.0    Protein Ur, POC negative negative   Urobilinogen, UA 0.2    Nitrite, UA Negative Negative   Leukocytes, UA Negative Negative  POCT Microscopic Urinalysis ([facility])  Result Value Ref Range   WBC,UR,HPF,POC Few (A) None WBC/hpf   RBC,UR,HPF,POC None None RBC/hpf   Bacteria Few (A) None, Too numerous to count   Mucus Absent Absent   Epithelial Cells, UR Per Microscopy Few (A) None, Too numerous to count cells/hpf      Assessment & Plan:  Alyssa Jefferson is a 70 y.o. female Abdominal pain, unspecified abdominal location - Plan: POCT CBC, POCT urinalysis dipstick, POCT Microscopic Urinalysis ([facility]), Gastrointestinal Pathogen Panel PCR, CT Abdomen Pelvis W Contrast  Frequent bowel movements - Plan: Gastrointestinal Pathogen Panel PCR, CT Abdomen Pelvis W Contrast  Diverticulosis of colon without hemorrhage - Plan: POCT CBC, CT Abdomen Pelvis W Contrast  1 month  history of cramping abdominal discomfort, one week of frequent bowel movements, soft stool, but no true diarrhea. Afebrile, no nausea or vomiting. History of diverticulosis on colonoscopy, so differential diagnosis includes diverticulitis. Reassuring WBC ( chronic anemia appears overall stable) on testing today, and no apparent UTI on urinalysis.   -Will check CT abdomen pelvis outpatient tomorrow   -check GI pathogen panel as recent hospitalization, and C. Difficile possible.   Criss Rosales diet, fluids, RTC precautions discussed.  No orders of the defined types were placed in this encounter.   Patient Instructions   We will schedule a CT scan of your abdomen tomorrow. If any other tests come  back positive will let you know. If any worsening, return here or the emergency room.  in the meantime, make sure  you're drinking plenty of fluids, avoid fried or fatty foods, avoid spicy foods, bland diet as much as possible.  Abdominal Pain, Adult Many things can cause abdominal pain. Usually, abdominal pain is not caused by a disease and will improve without treatment. It can often be observed and treated at home. Your health care provider will do a physical exam and possibly order blood tests and X-rays to help determine the seriousness of your pain. However, in many cases, more time must pass before a clear cause of the pain can be found. Before that point, your health care provider may not know if you need more testing or further treatment. HOME CARE INSTRUCTIONS Monitor your abdominal pain for any changes. The following actions may help to alleviate any discomfort you are experiencing:  Only take over-the-counter or prescription medicines as directed by your health care provider.  Do not take laxatives unless directed to do so by your health care provider.  Try a clear liquid diet (broth, tea, or water) as directed by your health care provider. Slowly move to a bland diet as tolerated. SEEK MEDICAL CARE IF:  You have unexplained abdominal pain.  You have abdominal pain associated with nausea or diarrhea.  You have pain when you urinate or have a bowel movement.  You experience abdominal pain that wakes you in the night.  You have abdominal pain that is worsened or improved by eating food.  You have abdominal pain that is worsened with eating fatty foods.  You have a fever. SEEK IMMEDIATE MEDICAL CARE IF:  Your pain does not go away within 2 hours.  You keep throwing up (vomiting).  Your pain is felt only in portions of the abdomen, such as the right side or the left lower portion of the abdomen.  You pass bloody or black tarry stools. MAKE SURE YOU:  Understand these instructions.  Will watch your condition.  Will get help right away if you are not doing well or get worse.     This information is not intended to replace advice given to you by your health care provider. Make sure you discuss any questions you have with your health care provider.   Document Released: 05/12/2005 Document Revised: 04/23/2015 Document Reviewed: 04/11/2013 Elsevier Interactive Patient Education [employer].     I personally performed the services described in this documentation, which was scribed in my presence. The recorded information has been reviewed and considered, and addended by me as needed.

## 2015-09-11 NOTE — Patient Instructions (Signed)
We will schedule a CT scan of your abdomen tomorrow. If any other tests come back positive will let you know. If any worsening, return here or the emergency room.  in the meantime, make sure you're drinking plenty of fluids, avoid fried or fatty foods, avoid spicy foods, bland diet as much as possible.  Abdominal Pain, Adult Many things can cause abdominal pain. Usually, abdominal pain is not caused by a disease and will improve without treatment. It can often be observed and treated at home. Your health care provider will do a physical exam and possibly order blood tests and X-rays to help determine the seriousness of your pain. However, in many cases, more time must pass before a clear cause of the pain can be found. Before that point, your health care provider may not know if you need more testing or further treatment. HOME CARE INSTRUCTIONS Monitor your abdominal pain for any changes. The following actions may help to alleviate any discomfort you are experiencing:  Only take over-the-counter or prescription medicines as directed by your health care provider.  Do not take laxatives unless directed to do so by your health care provider.  Try a clear liquid diet (broth, tea, or water) as directed by your health care provider. Slowly move to a bland diet as tolerated. SEEK MEDICAL CARE IF:  You have unexplained abdominal pain.  You have abdominal pain associated with nausea or diarrhea.  You have pain when you urinate or have a bowel movement.  You experience abdominal pain that wakes you in the night.  You have abdominal pain that is worsened or improved by eating food.  You have abdominal pain that is worsened with eating fatty foods.  You have a fever. SEEK IMMEDIATE MEDICAL CARE IF:  Your pain does not go away within 2 hours.  You keep throwing up (vomiting).  Your pain is felt only in portions of the abdomen, such as the right side or the left lower portion of the  abdomen.  You pass bloody or black tarry stools. MAKE SURE YOU:  Understand these instructions.  Will watch your condition.  Will get help right away if you are not doing well or get worse.   This information is not intended to replace advice given to you by your health care provider. Make sure you discuss any questions you have with your health care provider.   Document Released: 05/12/2005 Document Revised: 04/23/2015 Document Reviewed: 04/11/2013 Elsevier Interactive Patient Education [employer].

## 2015-09-12 DIAGNOSIS — R198 Other specified symptoms and signs involving the digestive system and abdomen: Secondary | ICD-10-CM | POA: Diagnosis not present

## 2015-09-12 DIAGNOSIS — R109 Unspecified abdominal pain: Secondary | ICD-10-CM | POA: Diagnosis not present

## 2015-09-16 LAB — GASTROINTESTINAL PATHOGEN PANEL PCR
C. difficile Tox A/B, PCR: NEGATIVE
Campylobacter, PCR: NEGATIVE
Cryptosporidium, PCR: NEGATIVE
E coli (ETEC) LT/ST PCR: NEGATIVE
E coli (STEC) stx1/stx2, PCR: NEGATIVE
E coli 0157, PCR: NEGATIVE
Giardia lamblia, PCR: NEGATIVE
Norovirus, PCR: NEGATIVE
Rotavirus A, PCR: NEGATIVE
Salmonella, PCR: NEGATIVE
Shigella, PCR: NEGATIVE

## 2015-09-22 DIAGNOSIS — I10 Essential (primary) hypertension: Secondary | ICD-10-CM | POA: Diagnosis not present

## 2015-09-24 DIAGNOSIS — M1712 Unilateral primary osteoarthritis, left knee: Secondary | ICD-10-CM | POA: Diagnosis not present

## 2015-09-30 ENCOUNTER — Telehealth: Payer: Self-pay

## 2015-09-30 NOTE — Telephone Encounter (Signed)
Spoke to pt about lab results.

## 2015-10-01 DIAGNOSIS — M1712 Unilateral primary osteoarthritis, left knee: Secondary | ICD-10-CM | POA: Diagnosis not present

## 2015-10-17 ENCOUNTER — Inpatient Hospital Stay: Admission: RE | Admit: 2015-10-17 | Payer: Self-pay | Source: Ambulatory Visit

## 2015-11-16 ENCOUNTER — Other Ambulatory Visit: Payer: Self-pay | Admitting: Emergency Medicine

## 2015-12-03 DIAGNOSIS — I10 Essential (primary) hypertension: Secondary | ICD-10-CM | POA: Diagnosis not present

## 2015-12-17 DIAGNOSIS — H401121 Primary open-angle glaucoma, left eye, mild stage: Secondary | ICD-10-CM | POA: Diagnosis not present

## 2015-12-17 DIAGNOSIS — H401112 Primary open-angle glaucoma, right eye, moderate stage: Secondary | ICD-10-CM | POA: Diagnosis not present

## 2015-12-17 DIAGNOSIS — H04123 Dry eye syndrome of bilateral lacrimal glands: Secondary | ICD-10-CM | POA: Diagnosis not present

## 2015-12-17 DIAGNOSIS — H2513 Age-related nuclear cataract, bilateral: Secondary | ICD-10-CM | POA: Diagnosis not present

## 2016-01-06 DIAGNOSIS — I10 Essential (primary) hypertension: Secondary | ICD-10-CM | POA: Diagnosis not present

## 2016-01-11 ENCOUNTER — Encounter (HOSPITAL_COMMUNITY): Payer: Self-pay | Admitting: Emergency Medicine

## 2016-01-11 ENCOUNTER — Emergency Department (HOSPITAL_COMMUNITY)
Admission: EM | Admit: 2016-01-11 | Discharge: 2016-01-11 | Disposition: A | Discharge status: Home / Self Care | Payer: Medicare Other | Attending: Emergency Medicine | Admitting: Emergency Medicine

## 2016-01-11 ENCOUNTER — Emergency Department (HOSPITAL_COMMUNITY): Payer: Medicare Other

## 2016-01-11 DIAGNOSIS — E039 Hypothyroidism, unspecified: Secondary | ICD-10-CM | POA: Insufficient documentation

## 2016-01-11 DIAGNOSIS — Z7901 Long term (current) use of anticoagulants: Secondary | ICD-10-CM | POA: Diagnosis not present

## 2016-01-11 DIAGNOSIS — F329 Major depressive disorder, single episode, unspecified: Secondary | ICD-10-CM | POA: Insufficient documentation

## 2016-01-11 DIAGNOSIS — Z96652 Presence of left artificial knee joint: Secondary | ICD-10-CM | POA: Insufficient documentation

## 2016-01-11 DIAGNOSIS — I10 Essential (primary) hypertension: Secondary | ICD-10-CM | POA: Insufficient documentation

## 2016-01-11 DIAGNOSIS — R519 Headache, unspecified: Secondary | ICD-10-CM

## 2016-01-11 DIAGNOSIS — Z87891 Personal history of nicotine dependence: Secondary | ICD-10-CM | POA: Insufficient documentation

## 2016-01-11 DIAGNOSIS — R51 Headache: Secondary | ICD-10-CM | POA: Insufficient documentation

## 2016-01-11 HISTORY — DX: Disorder of kidney and ureter, unspecified: N28.9

## 2016-01-11 LAB — BASIC METABOLIC PANEL
Anion gap: 8 (ref 5–15)
BUN: 21 mg/dL — ABNORMAL HIGH (ref 6–20)
CO2: 26 mmol/L (ref 22–32)
Calcium: 9.4 mg/dL (ref 8.9–10.3)
Chloride: 107 mmol/L (ref 101–111)
Creatinine, Ser: 1.13 mg/dL — ABNORMAL HIGH (ref 0.44–1.00)
GFR calc Af Amer: 56 mL/min — ABNORMAL LOW (ref >60)
GFR calc non Af Amer: 48 mL/min — ABNORMAL LOW (ref >60)
Glucose, Bld: 94 mg/dL (ref 65–99)
Potassium: 4.4 mmol/L (ref 3.5–5.1)
Sodium: 141 mmol/L (ref 135–145)

## 2016-01-11 LAB — CBC WITH DIFFERENTIAL/PLATELET
Basophils Absolute: 0 10*3/uL (ref 0.0–0.1)
Basophils Relative: 0 %
Eosinophils Absolute: 0.3 10*3/uL (ref 0.0–0.7)
Eosinophils Relative: 4 %
HCT: 34.7 % — ABNORMAL LOW (ref 36.0–46.0)
Hemoglobin: 11.2 g/dL — ABNORMAL LOW (ref 12.0–15.0)
Lymphocytes Relative: 24 %
Lymphs Abs: 1.6 10*3/uL (ref 0.7–4.0)
MCH: 25.5 pg — ABNORMAL LOW (ref 26.0–34.0)
MCHC: 32.3 g/dL (ref 30.0–36.0)
MCV: 79 fL (ref 78.0–100.0)
Monocytes Absolute: 0.4 10*3/uL (ref 0.1–1.0)
Monocytes Relative: 6 %
Neutro Abs: 4.3 10*3/uL (ref 1.7–7.7)
Neutrophils Relative %: 66 %
Platelets: 299 10*3/uL (ref 150–400)
RBC: 4.39 MIL/uL (ref 3.87–5.11)
RDW: 14.9 % (ref 11.5–15.5)
WBC: 6.6 10*3/uL (ref 4.0–10.5)

## 2016-01-11 LAB — URINALYSIS, ROUTINE W REFLEX MICROSCOPIC
Bilirubin Urine: NEGATIVE
Glucose, UA: NEGATIVE mg/dL
Hgb urine dipstick: NEGATIVE
Ketones, ur: NEGATIVE mg/dL
Leukocytes, UA: NEGATIVE
Nitrite: NEGATIVE
Protein, ur: NEGATIVE mg/dL
Specific Gravity, Urine: 1.019 (ref 1.005–1.030)
pH: 5 (ref 5.0–8.0)

## 2016-01-11 MED ORDER — METOCLOPRAMIDE HCL 5 MG/ML IJ SOLN
10.0000 mg | Freq: Once | INTRAMUSCULAR | Status: AC
  Administered 2016-01-11: 10 mg via INTRAVENOUS
  Filled 2016-01-11: qty 2

## 2016-01-11 MED ORDER — CLONIDINE HCL 0.1 MG PO TABS: 0.1000 mg | ORAL_TABLET | Freq: Two times a day (BID) | ORAL | Status: DC | PRN

## 2016-01-11 NOTE — ED Provider Notes (Signed)
CSN: [id number]     Arrival date & time 01/11/16  1004 History   First MD Initiated Contact with Patient 01/11/16 1012     Chief Complaint  Patient presents with  . Hypertension     The history is provided by the patient. No language interpreter was used.   Alyssa Jefferson is a 70 y.o. female who presents to the Emergency Department complaining of HTN. This morning she woke up with a headache at 7am.  It is described as bad, posterior, uncomfortable headache but not he worst headache in her life.  She describes associated blurry vision.  No fever, chest pain, sob, leg swelling, numbness, weakness.  She recently has been undergoing medications changes.  She was previously on toprol for HTN but it was discontinued four days ago due to cough, sob and lower extremity edema.  Her blood pressures at that time were well controlled.  Three days ago she was started on coreg 6.5 mg bid.  Since then her sob and leg swelling have resolved but her BP tends to remain elevated near 0000000 sytolic.  Today her BP was  177-194/79-89.  Given the elevated BP and headache she presented to the ED for evaluation.    Past Medical History  Diagnosis Date  . High cholesterol   . Hypertension   . Depression   . Cancer [facility]) 2004    breast  . Glaucoma   . Thyroid disease     thyroid removed  . Hypothyroidism   . GERD (gastroesophageal reflux disease)   . Arthritis     knees, back  . Heart palpitations   . Nocturia   . History of transfusion     many years ago  . OSA (obstructive sleep apnea)     tried CPAP- but unsuccessful  . Itching in the vaginal area   . Renal disorder    Past Surgical History  Procedure Laterality Date  . Abdominal hysterectomy  83    BSO  . Thyroid removed      due to knot  . Knee arthroscopy Left   . Breast surgery  2004    left breast removed due to cancer no chemo or radiation  . Mastectomy      LEFT  . Total knee arthroplasty Left 07/28/2015    Procedure: LEFT TOTAL KNEE  ARTHROPLASTY;  Surgeon: Gaynelle Arabian, MD;  Location: [facility];  Service: Orthopedics;  Laterality: Left;   Family History  Problem Relation Age of Onset  . Lung cancer Father   . Breast cancer Sister   . Ovarian cancer Sister    Social History  Substance Use Topics  . Smoking status: Former Smoker -- 20 years    Types: Cigarettes    Quit date: 08/16/1978  . Smokeless tobacco: Never Used     Comment: 1 pack per week--10/16/12  . Alcohol Use: No   OB History    Gravida Para Term Preterm AB TAB SAB Ectopic Multiple Living   0              Review of Systems  All other systems reviewed and are negative.     Allergies  Codeine and Lipitor  Home Medications   Prior to Admission medications   Medication Sig Start Date End Date Taking? Authorizing Provider  albuterol (PROVENTIL HFA;VENTOLIN HFA) 108 (90 BASE) MCG/ACT inhaler Inhale 2 puffs into the lungs every 6 (six) hours as needed for wheezing or shortness of breath.    Historical Provider,  MD  apixaban (ELIQUIS) 2.5 MG TABS tablet Take 1 tablet (2.5 mg total) by mouth every 12 (twelve) hours. Take Eliquis twice a day for two and a half more weeks, then discontinue Eliquis. Once the patient has completed the blood thinner regimen, then take a Baby 81 mg Aspirin daily for three more weeks. 07/29/15   Arlee Muslim, PA-C  Azelastine HCl 0.15 % SOLN Place 2 sprays into the nose 2 (two) times daily. 05/15/14   Shawnee Knapp, MD  cloNIDine (CATAPRES) 0.1 MG tablet Take 1 tablet (0.1 mg total) by mouth 2 (two) times daily as needed (Take one tablet if your blood pressure is greater than 99991111 systolic.). [id number]   Quintella Reichert, MD  fluticasone [facility]) 50 MCG/ACT nasal spray Place 1 spray into both nostrils daily as needed for allergies.     Historical Provider, MD  latanoprost (XALATAN) 0.005 % ophthalmic solution Place 1 drop into the right eye at bedtime.     Historical Provider, MD  levothyroxine (SYNTHROID) 100 MCG tablet Take 1 tablet  (100 mcg total) by mouth daily before breakfast. 02/06/15   Darlyne Russian, MD  methocarbamol (ROBAXIN) 500 MG tablet Take 1 tablet (500 mg total) by mouth every 6 (six) hours as needed for muscle spasms. 07/29/15   Arlee Muslim, PA-C  omeprazole (PRILOSEC) 20 MG capsule take 1 capsule by mouth once daily 11/16/15   Chelle Jeffery, PA-C  rosuvastatin (CRESTOR) 10 MG tablet Take 10 mg by mouth at bedtime.    Historical Provider, MD  valsartan-hydrochlorothiazide (DIOVAN-HCT) 320-12.5 MG tablet Take 1 tablet by mouth daily.    Historical Provider, MD   BP 191/68 mmHg  Pulse 53  Temp(Src) 98.5 F (36.9 C) (Oral)  Resp 19  SpO2 98% Physical Exam  Constitutional: She is oriented to person, place, and time. She appears well-developed and well-nourished.  HENT:  Head: Normocephalic and atraumatic.  Eyes: EOM are normal.  Neck: Neck supple.  Cardiovascular: Normal rate and regular rhythm.   No murmur heard. Pulmonary/Chest: Effort normal and breath sounds normal. No respiratory distress.  Abdominal: Soft. There is no tenderness. There is no rebound and no guarding.  Musculoskeletal: She exhibits no edema or tenderness.  Neurological: She is alert and oriented to person, place, and time.  5/5 strength in all four extremities.  No facial asymmetry.    Skin: Skin is warm and dry.  Psychiatric: She has a normal mood and affect. Her behavior is normal.  Nursing note and vitals reviewed.   ED Course  Procedures (including critical care time) Labs Review Labs Reviewed  BASIC METABOLIC PANEL - Abnormal; Notable for the following:    BUN 21 (*)    Creatinine, Ser 1.13 (*)    GFR calc non Af Amer 48 (*)    GFR calc Af Amer 56 (*)    All other components within normal limits  CBC WITH DIFFERENTIAL/PLATELET - Abnormal; Notable for the following:    Hemoglobin 11.2 (*)    HCT 34.7 (*)    MCH 25.5 (*)    All other components within normal limits  URINALYSIS, ROUTINE W REFLEX MICROSCOPIC (NOT AT  [facility])    Imaging Review Ct Head Wo Contrast  01/11/2016  CLINICAL DATA:  Headache and hypertension. Remote history of breast cancer. EXAM: CT HEAD WITHOUT CONTRAST TECHNIQUE: Contiguous axial images were obtained from the base of the skull through the vertex without intravenous contrast. COMPARISON:  02/01/2014 head CT. FINDINGS: No evidence of parenchymal hemorrhage or  extra-axial fluid collection. No mass lesion, mass effect, or midline shift. No CT evidence of acute infarction. Mild intracranial atherosclerosis. Cerebral volume is age appropriate. No ventriculomegaly. The visualized paranasal sinuses are essentially clear. The mastoid air cells are unopacified. No evidence of calvarial fracture. Asymmetric osteoarthritis in the left temporomandibular joint with probable interval progression. IMPRESSION: 1.  No evidence of acute intracranial abnormality. 2. 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 Electronically Signed   By: Ilona Sorrel M.D.   On: 01/11/2016 11:15   I have personally reviewed and evaluated these images and lab results as part of my medical decision-making.   EKG Interpretation   Date/Time:  Sunday Jan 11 2016 10:55:24 EDT Ventricular Rate:  59 PR Interval:  168 QRS Duration: 95 QT Interval:  416 QTC Calculation: 412 R Axis:   60 Text Interpretation:  Sinus rhythm Borderline T wave abnormalities  Confirmed by Hazle Coca [phone]) on 01/11/2016 11:25:17 AM      MDM   Final diagnoses:  Essential hypertension  Bad headache    Pt here for evaluation of headache, HTN.  Pt's headache resolved in the ED following reglan and BP on recheck was [id number] systolic.  Presentation not c/w hypertensive urgency, SAH, CVA, meningitis.  D/w pt home care for headache as well as hypertension.  Providing Rx for clonidine if she has recurrent HTN.  On last recheck in the ED pt with BP 150.  Unaware of  BP 191/68 at time of discharge.      Quintella Reichert, MD 01/11/16  [phone]

## 2016-01-11 NOTE — ED Notes (Signed)
MD at bedside. 

## 2016-01-11 NOTE — Discharge Instructions (Signed)
You can take tylenol, available over the counter if you develop headache.  Get rechecked immediately if you develop new or concerning symptoms such as chest pain, numbness, weakness, or severe headache.    General Headache Without Cause A headache is pain or discomfort felt around the head or neck area. The specific cause of a headache may not be found. There are many causes and types of headaches. A few common ones are:  Tension headaches.  Migraine headaches.  Cluster headaches.  Chronic daily headaches. HOME CARE INSTRUCTIONS  Watch your condition for any changes. Take these steps to help with your condition: Managing Pain  Take over-the-counter and prescription medicines only as told by your health care provider.  Lie down in a dark, quiet room when you have a headache.  If directed, apply ice to the head and neck area:  Put ice in a plastic bag.  Place a towel between your skin and the bag.  Leave the ice on for 20 minutes, 2-3 times per day.  Use a heating pad or hot shower to apply heat to the head and neck area as told by your health care provider.  Keep lights dim if bright lights bother you or make your headaches worse. Eating and Drinking  Eat meals on a regular schedule.  Limit alcohol use.  Decrease the amount of caffeine you drink, or stop drinking caffeine. General Instructions  Keep all follow-up visits as told by your health care provider. This is important.  Keep a headache journal to help find out what may trigger your headaches. For example, write down:  What you eat and drink.  How much sleep you get.  Any change to your diet or medicines.  Try massage or other relaxation techniques.  Limit stress.  Sit up straight, and do not tense your muscles.  Do not use tobacco products, including cigarettes, chewing tobacco, or e-cigarettes. If you need help quitting, ask your health care provider.  Exercise regularly as told by your health care  provider.  Sleep on a regular schedule. Get 7-9 hours of sleep, or the amount recommended by your health care provider. SEEK MEDICAL CARE IF:   Your symptoms are not helped by medicine.  You have a headache that is different from the usual headache.  You have nausea or you vomit.  You have a fever. SEEK IMMEDIATE MEDICAL CARE IF:   Your headache becomes severe.  You have repeated vomiting.  You have a stiff neck.  You have a loss of vision.  You have problems with speech.  You have pain in the eye or ear.  You have muscular weakness or loss of muscle control.  You lose your balance or have trouble walking.  You feel faint or pass out.  You have confusion.   This information is not intended to replace advice given to you by your health care provider. Make sure you discuss any questions you have with your health care provider.   Document Released: 08/02/2005 Document Revised: 04/23/2015 Document Reviewed: 11/25/2014 Elsevier Interactive Patient Education 2016 [employer].  Hypertension Hypertension, commonly called high blood pressure, is when the force of blood pumping through your arteries is too strong. Your arteries are the blood vessels that carry blood from your heart throughout your body. A blood pressure reading consists of a higher number over a lower number, such as 110/72. The higher number (systolic) is the pressure inside your arteries when your heart pumps. The lower number (diastolic) is the pressure  inside your arteries when your heart relaxes. Ideally you want your blood pressure below 120/80. Hypertension forces your heart to work harder to pump blood. Your arteries may become narrow or stiff. Having untreated or uncontrolled hypertension can cause heart attack, stroke, kidney disease, and other problems. RISK FACTORS Some risk factors for high blood pressure are controllable. Others are not.  Risk factors you cannot control include:   Race. You may be  at higher risk if you are African American.  Age. Risk increases with age.  Gender. Men are at higher risk than women before age 32 years. After age 57, women are at higher risk than men. Risk factors you can control include:  Not getting enough exercise or physical activity.  Being overweight.  Getting too much fat, sugar, calories, or salt in your diet.  Drinking too much alcohol. SIGNS AND SYMPTOMS Hypertension does not usually cause signs or symptoms. Extremely high blood pressure (hypertensive crisis) may cause headache, anxiety, shortness of breath, and nosebleed. DIAGNOSIS To check if you have hypertension, your health care provider will measure your blood pressure while you are seated, with your arm held at the level of your heart. It should be measured at least twice using the same arm. Certain conditions can cause a difference in blood pressure between your right and left arms. A blood pressure reading that is higher than normal on one occasion does not mean that you need treatment. If it is not clear whether you have high blood pressure, you may be asked to return on a different day to have your blood pressure checked again. Or, you may be asked to monitor your blood pressure at home for 1 or more weeks. TREATMENT Treating high blood pressure includes making lifestyle changes and possibly taking medicine. Living a healthy lifestyle can help lower high blood pressure. You may need to change some of your habits. Lifestyle changes may include:  Following the DASH diet. This diet is high in fruits, vegetables, and whole grains. It is low in salt, red meat, and added sugars.  Keep your sodium intake below 2,300 mg per day.  Getting at least 30-45 minutes of aerobic exercise at least 4 times per week.  Losing weight if necessary.  Not smoking.  Limiting alcoholic beverages.  Learning ways to reduce stress. Your health care provider may prescribe medicine if lifestyle changes  are not enough to get your blood pressure under control, and if one of the following is true:  You are 25-46 years of age and your systolic blood pressure is above 140.  You are 42 years of age or older, and your systolic blood pressure is above 150.  Your diastolic blood pressure is above 90.  You have diabetes, and your systolic blood pressure is over [id number] or your diastolic blood pressure is over 90.  You have kidney disease and your blood pressure is above 140/90.  You have heart disease and your blood pressure is above 140/90. Your personal target blood pressure may vary depending on your medical conditions, your age, and other factors. HOME CARE INSTRUCTIONS  Have your blood pressure rechecked as directed by your health care provider.   Take medicines only as directed by your health care provider. Follow the directions carefully. Blood pressure medicines must be taken as prescribed. The medicine does not work as well when you skip doses. Skipping doses also puts you at risk for problems.  Do not smoke.   Monitor your blood pressure at home as directed  by your health care provider. SEEK MEDICAL CARE IF:   You think you are having a reaction to medicines taken.  You have recurrent headaches or feel dizzy.  You have swelling in your ankles.  You have trouble with your vision. SEEK IMMEDIATE MEDICAL CARE IF:  You develop a severe headache or confusion.  You have unusual weakness, numbness, or feel faint.  You have severe chest or abdominal pain.  You vomit repeatedly.  You have trouble breathing. MAKE SURE YOU:   Understand these instructions.  Will watch your condition.  Will get help right away if you are not doing well or get worse.   This information is not intended to replace advice given to you by your health care provider. Make sure you discuss any questions you have with your health care provider.   Document Released: 08/02/2005 Document Revised:  12/17/2014 Document Reviewed: 05/25/2013 Elsevier Interactive Patient Education [employer].

## 2016-01-11 NOTE — ED Notes (Signed)
Per pt, states her BP meds were changed on Thursday-states she woke up with headache this am and took BP-states it was high

## 2016-01-13 ENCOUNTER — Ambulatory Visit (INDEPENDENT_AMBULATORY_CARE_PROVIDER_SITE_OTHER): Payer: Medicare Other | Admitting: Emergency Medicine

## 2016-01-13 VITALS — BP 160/98 | HR 61 | Temp 97.9°F | Resp 16 | Ht 62.0 in | Wt 228.6 lb

## 2016-01-13 DIAGNOSIS — G44039 Episodic paroxysmal hemicrania, not intractable: Secondary | ICD-10-CM | POA: Diagnosis not present

## 2016-01-13 DIAGNOSIS — I10 Essential (primary) hypertension: Secondary | ICD-10-CM

## 2016-01-13 MED ORDER — CARVEDILOL 3.125 MG PO TABS: ORAL_TABLET | ORAL | Status: DC

## 2016-01-13 NOTE — Progress Notes (Signed)
By signing my name below, I, Alyssa Jefferson, attest that this documentation has been prepared under the direction and in the presence of Alyssa Queen, Jefferson. Electronically Signed: Moises Jefferson, [place]. 01/13/2016 , 1:30 PM .  Patient was seen in room 2 .  Chief Complaint:  Chief Complaint  Patient presents with  . Hypertension    fluctuating BP. Pt is on medication for HTN.     HPI: Alyssa Jefferson is a 70 y.o. female who has h/o HTN reports to Alyssa Jefferson today complaining of fluctuating BP. Patient states she went to Alyssa Jefferson office 5 days ago, saw the NP and was prescribed carvedilol. She noticed her BP was about 200/100 and was seen the ER 2 days ago. She was given catapres. Her BP was about 191/68 when she was discharged, still feeling dizzy. She had CT head done while in the ER, and it showed no evidence of acute intracranial abnormality. She also had lab work done, and her labs also showed improving kidney functions.   She informs her BP is usually higher in the morning and in the afternoon around 5:00PM. She recently noticed some pain in the back of her knees. She previously had a headache but this has resolved. She's currently taking carvedilol bid and has only taken catapres once 2 evenings ago. She denies any complications with her medications.   Past Medical History  Diagnosis Date  . High cholesterol   . Hypertension   . Depression   . Cancer [facility]) 2004    breast  . Glaucoma   . Thyroid disease     thyroid removed  . Hypothyroidism   . GERD (gastroesophageal reflux disease)   . Arthritis     knees, back  . Heart palpitations   . Nocturia   . History of transfusion     many years ago  . OSA (obstructive sleep apnea)     tried CPAP- but unsuccessful  . Itching in the vaginal area   . Renal disorder    Past Surgical History  Procedure Laterality Date  . Abdominal hysterectomy  83    BSO  . Thyroid removed      due to knot  . Knee arthroscopy Left   . Breast  surgery  2004    left breast removed due to cancer no chemo or radiation  . Mastectomy      LEFT  . Total knee arthroplasty Left 07/28/2015    Procedure: LEFT TOTAL KNEE ARTHROPLASTY;  Surgeon: Alyssa Arabian, Jefferson;  Location: [facility];  Service: Orthopedics;  Laterality: Left;   Social History   Social History  . Marital Status: Widowed    Spouse Name: N/A  . Number of Children: N/A  . Years of Education: N/A   Occupational History  . retired    Social History Main Topics  . Smoking status: Former Smoker -- 20 years    Types: Cigarettes    Quit date: 08/16/1978  . Smokeless tobacco: Never Used     Comment: 1 pack per week--10/16/12  . Alcohol Use: No  . Drug Use: No  . Sexual Activity: No   Other Topics Concern  . None   Social History Narrative   Family History  Problem Relation Age of Onset  . Lung cancer Father   . Breast cancer Sister   . Ovarian cancer Sister    Allergies  Allergen Reactions  . Codeine Other (See Comments)    confusion  . Lipitor [Atorvastatin Calcium]  Itching   Prior to Admission medications   Medication Sig Start Date End Date Taking? Authorizing Provider  albuterol (PROVENTIL HFA;VENTOLIN HFA) 108 (90 BASE) MCG/ACT inhaler Inhale 2 puffs into the lungs every 6 (six) hours as needed for wheezing or shortness of breath.   Yes Historical Provider, Jefferson  carvedilol (COREG) 3.125 MG tablet Take 3.125 mg by mouth 2 (two) times daily with a meal.   Yes Historical Provider, Jefferson  cloNIDine (CATAPRES) 0.1 MG tablet Take 1 tablet (0.1 mg total) by mouth 2 (two) times daily as needed (Take one tablet if your Jefferson pressure is greater than 99991111 systolic.). [id number]  Yes Alyssa Jefferson  fluticasone [facility]) 50 MCG/ACT nasal spray Place 1 spray into both nostrils daily as needed for allergies.    Yes Historical Provider, Jefferson  latanoprost (XALATAN) 0.005 % ophthalmic solution Place 1 drop into the right eye at bedtime.    Yes Historical Provider, Jefferson    levothyroxine (SYNTHROID) 100 MCG tablet Take 1 tablet (100 mcg total) by mouth daily before breakfast. 02/06/15  Yes Alyssa Russian, Jefferson  methocarbamol (ROBAXIN) 500 MG tablet Take 1 tablet (500 mg total) by mouth every 6 (six) hours as needed for muscle spasms. 07/29/15  Yes Alyssa Muslim, [place]  omeprazole (PRILOSEC) 20 MG capsule take 1 capsule by mouth once daily 11/16/15  Yes Alyssa Jefferson  rosuvastatin (CRESTOR) 10 MG tablet Take 10 mg by mouth at bedtime.   Yes Historical Provider, Jefferson  valsartan-hydrochlorothiazide (DIOVAN-HCT) 320-12.5 MG tablet Take 1 tablet by mouth daily.   Yes Historical Provider, Jefferson     ROS:  Constitutional: negative for fever, chills, night sweats, weight changes, or fatigue  HEENT: negative for vision changes, hearing loss, congestion, rhinorrhea, ST, epistaxis, or sinus pressure Cardiovascular: negative for chest pain or palpitations Respiratory: negative for hemoptysis, wheezing, shortness of breath, or cough Abdominal: negative for abdominal pain, nausea, vomiting, diarrhea, or constipation Dermatological: negative for rash Neurologic: negative for dizziness, or syncope; positive for headache All other systems reviewed and are otherwise negative with the exception to those above and in the HPI.  PHYSICAL EXAM: Filed Vitals:   01/13/16 1205  BP: 160/98  Pulse: 61  Temp: 97.9 F (36.6 C)  Resp: 16   Body mass index is 41.8 kg/(m^2).   General: Alert, no acute distress HEENT:  Normocephalic, atraumatic, oropharynx patent. Eye: Alyssa Jefferson Cardiovascular:  Regular rate and rhythm, no rubs murmurs or gallops.  No Carotid bruits, radial pulse intact. No pedal edema.  Respiratory: Clear to auscultation bilaterally.  No wheezes, rales, or rhonchi.  No cyanosis, no use of accessory musculature Abdominal: No organomegaly, abdomen is soft and non-tender, positive bowel sounds. No masses. Musculoskeletal: Gait intact. No edema, tenderness Skin: No  rashes. Neurologic: Facial musculature symmetric. Psychiatric: Patient acts appropriately throughout our interaction.  Lymphatic: No cervical or submandibular lymphadenopathy Genitourinary/Anorectal: No acute findings  BP recheck in room, right arm (sitting): 170/90  LABS:   EKG/XRAY:     ASSESSMENT/PLAN:  She has Coreg 3.125. I increased the Coreg so she will be on a dosage of 9.375. She does have clonidine 0.1 for backup. Her heart rate was 60 in the office. She will keep [place] updated regarding her Jefferson pressure.I personally performed the services described in this documentation, which was scribed in my presence. The recorded information has been reviewed and is accurate.  Gross sideeffects, risk and benefits, and alternatives of medications d/w patient. Patient is aware that all medications have  potential sideeffects and we are unable to predict every sideeffect or drug-drug interaction that may occur.  Alyssa Queen Jefferson 01/13/2016 1:37 PM

## 2016-01-13 NOTE — Patient Instructions (Addendum)
IF you received an x-ray today, you will receive an invoice from [facility]. Please contact [facility] at [phone] with questions or concerns regarding your invoice.   IF you received labwork today, you will receive an invoice from [employer]. Please contact [facility] at [phone] with questions or concerns regarding your invoice.   Our billing staff will not be able to assist you with questions regarding bills from these companies.  You will be contacted with the lab results as soon as they are available. The fastest way to get your results is to activate your My Chart account. Instructions are located on the last page of this paperwork. If you have not heard from [place] regarding the results in 2 weeks, please contact this office.     hyperteHypertension Hypertension, commonly called high blood pressure, is when the force of blood pumping through your arteries is too strong. Your arteries are the blood vessels that carry blood from your heart throughout your body. A blood pressure reading consists of a higher number over a lower number, such as 110/72. The higher number (systolic) is the pressure inside your arteries when your heart pumps. The lower number (diastolic) is the pressure inside your arteries when your heart relaxes. Ideally you want your blood pressure below 120/80. Hypertension forces your heart to work harder to pump blood. Your arteries may become narrow or stiff. Having untreated or uncontrolled hypertension can cause heart attack, stroke, kidney disease, and other problems. RISK FACTORS Some risk factors for high blood pressure are controllable. Others are not.  Risk factors you cannot control include:   Race. You may be at higher risk if you are African American.  Age. Risk increases with age.  Gender. Men are at higher risk than women before age 87 years. After age 31, women are at higher risk than men. Risk factors  you can control include:  Not getting enough exercise or physical activity.  Being overweight.  Getting too much fat, sugar, calories, or salt in your diet.  Drinking too much alcohol. SIGNS AND SYMPTOMS Hypertension does not usually cause signs or symptoms. Extremely high blood pressure (hypertensive crisis) may cause headache, anxiety, shortness of breath, and nosebleed. DIAGNOSIS To check if you have hypertension, your health care provider will measure your blood pressure while you are seated, with your arm held at the level of your heart. It should be measured at least twice using the same arm. Certain conditions can cause a difference in blood pressure between your right and left arms. A blood pressure reading that is higher than normal on one occasion does not mean that you need treatment. If it is not clear whether you have high blood pressure, you may be asked to return on a different day to have your blood pressure checked again. Or, you may be asked to monitor your blood pressure at home for 1 or more weeks. TREATMENT Treating high blood pressure includes making lifestyle changes and possibly taking medicine. Living a healthy lifestyle can help lower high blood pressure. You may need to change some of your habits. Lifestyle changes may include:  Following the DASH diet. This diet is high in fruits, vegetables, and whole grains. It is low in salt, red meat, and added sugars.  Keep your sodium intake below 2,300 mg per day.  Getting at least 30-45 minutes of aerobic exercise at least 4 times per week.  Losing weight if necessary.  Not smoking.  Limiting alcoholic beverages.  Learning ways to reduce stress. Your health care provider may prescribe medicine if lifestyle changes are not enough to get your blood pressure under control, and if one of the following is true:  You are 32-71 years of age and your systolic blood pressure is above 140.  You are 3 years of age or older,  and your systolic blood pressure is above 150.  Your diastolic blood pressure is above 90.  You have diabetes, and your systolic blood pressure is over [id number] or your diastolic blood pressure is over 90.  You have kidney disease and your blood pressure is above 140/90.  You have heart disease and your blood pressure is above 140/90. Your personal target blood pressure may vary depending on your medical conditions, your age, and other factors. HOME CARE INSTRUCTIONS  Have your blood pressure rechecked as directed by your health care provider.   Take medicines only as directed by your health care provider. Follow the directions carefully. Blood pressure medicines must be taken as prescribed. The medicine does not work as well when you skip doses. Skipping doses also puts you at risk for problems.  Do not smoke.   Monitor your blood pressure at home as directed by your health care provider. SEEK MEDICAL CARE IF:   You think you are having a reaction to medicines taken.  You have recurrent headaches or feel dizzy.  You have swelling in your ankles.  You have trouble with your vision. SEEK IMMEDIATE MEDICAL CARE IF:  You develop a severe headache or confusion.  You have unusual weakness, numbness, or feel faint.  You have severe chest or abdominal pain.  You vomit repeatedly.  You have trouble breathing. MAKE SURE YOU:   Understand these instructions.  Will watch your condition.  Will get help right away if you are not doing well or get worse.   This information is not intended to replace advice given to you by your health care provider. Make sure you discuss any questions you have with your health care provider.   Document Released: 08/02/2005 Document Revised: 12/17/2014 Document Reviewed: 05/25/2013 Elsevier Interactive Patient Education [employer].

## 2016-01-26 ENCOUNTER — Ambulatory Visit (INDEPENDENT_AMBULATORY_CARE_PROVIDER_SITE_OTHER): Payer: Medicare Other | Admitting: Emergency Medicine

## 2016-01-26 VITALS — BP 146/80 | HR 57 | Temp 97.9°F | Resp 16 | Ht 62.0 in | Wt 227.0 lb

## 2016-01-26 DIAGNOSIS — M545 Low back pain, unspecified: Secondary | ICD-10-CM

## 2016-01-26 DIAGNOSIS — J209 Acute bronchitis, unspecified: Secondary | ICD-10-CM | POA: Diagnosis not present

## 2016-01-26 DIAGNOSIS — R05 Cough: Secondary | ICD-10-CM | POA: Diagnosis not present

## 2016-01-26 DIAGNOSIS — R059 Cough, unspecified: Secondary | ICD-10-CM

## 2016-01-26 MED ORDER — DOXYCYCLINE HYCLATE 100 MG PO TABS: 100.0000 mg | ORAL_TABLET | Freq: Two times a day (BID) | ORAL | Status: DC

## 2016-01-26 MED ORDER — BENZONATATE 100 MG PO CAPS: 100.0000 mg | ORAL_CAPSULE | Freq: Three times a day (TID) | ORAL | Status: DC | PRN

## 2016-01-26 NOTE — Progress Notes (Signed)
Subjective:  This chart was scribed for Alyssa Jefferson, by [name], at [facility].  This patient was seen in room 4 and the patient's care was started at 2:13 PM.   Chief Complaint  Patient presents with   Cough    x 3 days, pt. says coughing up yellowish color   Back Pain    lower, x 5 days     Patient ID: MAREESA Jefferson, female    DOB: 1945-10-07, 70 y.o.   MRN: [id number]  HPI HPI Comments: Alyssa Jefferson is a 70 y.o. female who presents to the [facility] complaining of a productive cough onset two days ago (yellow mucous).  She had some wheezing this morning but states that it is now gone.  Her symptoms first started with a dry cough three days ago and states that it progressed into a productive one.  Denies fever/chills, congestion, shortness of breath. Patient was a smoker in the 80's but no longer smokes anymore. She thinks she may have some allergies so pollen. She has no history of asthma.    She is also complaining of back pain onset 5 days ago.   Patient is complaint with her blood pressure medication which she was started on a little over a week ago.  Patient did not have her symptoms before starting this medication.    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 12/20/2011   Osteoarthritis, knee 12/20/2011   Other and unspecified hyperlipidemia 12/20/2011   Depressive disorder, not elsewhere classified 12/20/2011   Unspecified essential hypertension 12/20/2011   Anemia 12/20/2011   Cancer ([place])    Past Medical History  Diagnosis Date   High cholesterol    Hypertension    Depression    Cancer ([place]) 2004    breast   Glaucoma    Thyroid disease    thyroid removed   Hypothyroidism    GERD (gastroesophageal reflux disease)    Arthritis     knees, back   Heart palpitations    Nocturia    History of transfusion     many years ago   OSA (obstructive sleep apnea)     tried CPAP- but unsuccessful   Itching in the vaginal area    Renal disorder    Past Surgical History  Procedure Laterality Date   Abdominal hysterectomy  82    BSO   Thyroid removed      due to knot   Knee arthroscopy Left    Breast surgery  2004    left breast removed due to cancer no chemo or radiation   Mastectomy      LEFT   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 Authorizing Provider  albuterol (PROVENTIL HFA;VENTOLIN HFA) 108 (90 BASE) MCG/ACT inhaler Inhale 2 puffs into the lungs every 6 (six) hours as needed for wheezing or shortness of breath.  Yes Historical Provider, Jefferson  fluticasone (FLONASE) 50 MCG/ACT nasal spray Place 1 spray into both nostrils daily as needed for allergies.    Yes Historical Provider, Jefferson  latanoprost (XALATAN) 0.005 % ophthalmic solution Place 1 drop into the right eye at bedtime.    Yes Historical Provider, Jefferson  levothyroxine (SYNTHROID) 100 MCG tablet Take 1 tablet (100 mcg total) by mouth daily before breakfast. 02/06/15  Yes Darlyne Russian, Jefferson  nebivolol (BYSTOLIC) 10 MG tablet Take 10 mg by mouth daily.   Yes Historical Provider, Jefferson  omeprazole (PRILOSEC) 20 MG capsule take 1 capsule by mouth once daily 11/16/15  Yes Chelle Jeffery, PA-C  rosuvastatin (CRESTOR) 10 MG tablet Take 10 mg by mouth at bedtime.   Yes Historical Provider, Jefferson  cloNIDine (CATAPRES) 0.1 MG tablet Take 1 tablet  (0.1 mg total) by mouth 2 (two) times daily as needed (Take one tablet if your blood pressure is greater than 99991111 systolic.). Patient not taking: Reported on 01/26/2016 01/11/16   Quintella Reichert, Jefferson  methocarbamol (ROBAXIN) 500 MG tablet Take 1 tablet (500 mg total) by mouth every 6 (six) hours as needed for muscle spasms. Patient not taking: Reported on 01/26/2016 07/29/15   Arlee Muslim, PA-C  valsartan-hydrochlorothiazide (DIOVAN-HCT) 320-12.5 MG tablet Take 1 tablet by mouth daily. Reported on 01/26/2016    Historical Provider, Jefferson   Social History   Social History   Marital Status: Widowed    Spouse Name: N/A   Number of Children: N/A   Years of Education: N/A   Occupational History   retired    Social History Main Topics   Smoking status: Former Smoker -- 20 years    Types: Cigarettes    Quit date: 08/16/1978   Smokeless tobacco: Never Used     Comment: 1 pack per week--10/16/12   Alcohol Use: No   Drug Use: No   Sexual Activity: No   Other Topics Concern   Not on file   Social History Narrative       Review of Systems  Constitutional: Negative for fever and chills.  Eyes: Negative for pain, redness and itching.  Respiratory: Positive for cough. Negative for choking and shortness of breath.   Cardiovascular: Negative for chest pain.  Gastrointestinal: Negative for nausea and vomiting.  Musculoskeletal: Positive for back pain. Negative for gait problem, neck pain and neck stiffness.  Skin: Negative for color change.  Neurological: Negative for seizures, syncope and speech difficulty.       Objective:   Physical Exam  Filed Vitals:   01/26/16 1227  BP: 146/80  Pulse: 57  Temp: 97.9 F (36.6 C)  TempSrc: Oral  Resp: 16  Height: 5\' 2"  (1.575 m)  Weight: 227 lb (102.967 kg)  SpO2: 96%    CONSTITUTIONAL: Well developed/well nourished HEAD: Normocephalic/atraumatic EYES: EOMI/PERRL ENMT: unremarkable.  NECK: supple no meningeal  signs SPINE/BACK:entire spine nontender CV: irregular LUNGS:Occasional rhonchi on exam  NEURO: Pt is awake/alert/appropriate, moves all extremitiesx4.  No facial droop.   SKIN: warm, color normal PSYCH: no abnormalities of mood noted, alert and oriented to situation      Assessment & Plan:  She has been in multiple offices over the last week. She has been our office the emergency room and to see Dr. Einar Gip . I suspect she picked up some type of viral illness and now has a secondary bronchitis. She is coughing up yellow-green phlegm. She has some low back discomfort. Will treat as a bronchitis with doxycycline  and Mucinex and Tessalon Perles. She will follow-up in 48-72 hours if not improving.I personally performed the services described in this documentation, which was scribed in my presence. The recorded information has been reviewed and is accurate.

## 2016-01-26 NOTE — Patient Instructions (Addendum)
Take antibiotics twice a day. If not better in 48-72 hours return to clinic for x-ray. Take Mucinex twice a day along with your medications    IF you received an x-ray today, you will receive an invoice from [facility]. Please contact [facility] at [phone] with questions or concerns regarding your invoice.   IF you received labwork today, you will receive an invoice from [employer]. Please contact [facility] at [phone] with questions or concerns regarding your invoice.   Our billing staff will not be able to assist you with questions regarding bills from these companies.  You will be contacted with the lab results as soon as they are available. The fastest way to get your results is to activate your My Chart account. Instructions are located on the last page of this paperwork. If you have not heard from [place] regarding the results in 2 weeks, please contact this office.    Acute Bronchitis Bronchitis is inflammation of the airways that extend from the windpipe into the lungs (bronchi). The inflammation often causes mucus to develop. This leads to a cough, which is the most common symptom of bronchitis.  In acute bronchitis, the condition usually develops suddenly and goes away over time, usually in a couple weeks. Smoking, allergies, and asthma can make bronchitis worse. Repeated episodes of bronchitis may cause further lung problems.  CAUSES Acute bronchitis is most often caused by the same virus that causes a cold. The virus can spread from person to person (contagious) through coughing, sneezing, and touching contaminated objects. SIGNS AND SYMPTOMS   Cough.   Fever.   Coughing up mucus.   Body aches.   Chest congestion.   Chills.   Shortness of breath.   Sore throat.  DIAGNOSIS  Acute bronchitis is usually diagnosed through a physical exam. Your health care provider will also ask you questions about your medical  history. Tests, such as chest X-rays, are sometimes done to rule out other conditions.  TREATMENT  Acute bronchitis usually goes away in a couple weeks. Oftentimes, no medical treatment is necessary. Medicines are sometimes given for relief of fever or cough. Antibiotic medicines are usually not needed but may be prescribed in certain situations. In some cases, an inhaler may be recommended to help reduce shortness of breath and control the cough. A cool mist vaporizer may also be used to help thin bronchial secretions and make it easier to clear the chest.  HOME CARE INSTRUCTIONS  Get plenty of rest.   Drink enough fluids to keep your urine clear or pale yellow (unless you have a medical condition that requires fluid restriction). Increasing fluids may help thin your respiratory secretions (sputum) and reduce chest congestion, and it will prevent dehydration.   Take medicines only as directed by your health care provider.  If you were prescribed an antibiotic medicine, finish it all even if you start to feel better.  Avoid smoking and secondhand smoke. Exposure to cigarette smoke or irritating chemicals will make bronchitis worse. If you are a smoker, consider using nicotine gum or skin patches to help control withdrawal symptoms. Quitting smoking will help your lungs heal faster.   Reduce the chances of another bout of acute bronchitis by washing your hands frequently, avoiding people with cold symptoms, and trying not to touch your hands to your mouth, nose, or eyes.   Keep all follow-up visits as directed by your health care provider.  SEEK MEDICAL CARE IF: Your symptoms do not improve after  1 week of treatment.  SEEK IMMEDIATE MEDICAL CARE IF:  You develop an increased fever or chills.   You have chest pain.   You have severe shortness of breath.  You have bloody sputum.   You develop dehydration.  You faint or repeatedly feel like you are going to pass out.  You  develop repeated vomiting.  You develop a severe headache. MAKE SURE YOU:   Understand these instructions.  Will watch your condition.  Will get help right away if you are not doing well or get worse.   This information is not intended to replace advice given to you by your health care provider. Make sure you discuss any questions you have with your health care provider.   Document Released: 09/09/2004 Document Revised: 08/23/2014 Document Reviewed: 01/23/2013 Elsevier Interactive Patient Education [employer].

## 2016-02-04 DIAGNOSIS — I10 Essential (primary) hypertension: Secondary | ICD-10-CM | POA: Diagnosis not present

## 2016-02-11 DIAGNOSIS — I10 Essential (primary) hypertension: Secondary | ICD-10-CM | POA: Diagnosis not present

## 2016-03-03 DIAGNOSIS — I129 Hypertensive chronic kidney disease with stage 1 through stage 4 chronic kidney disease, or unspecified chronic kidney disease: Secondary | ICD-10-CM | POA: Diagnosis not present

## 2016-03-03 DIAGNOSIS — N183 Chronic kidney disease, stage 3 (moderate): Secondary | ICD-10-CM | POA: Diagnosis not present

## 2016-03-03 DIAGNOSIS — I10 Essential (primary) hypertension: Secondary | ICD-10-CM | POA: Diagnosis not present

## 2016-03-10 ENCOUNTER — Other Ambulatory Visit: Payer: Self-pay

## 2016-03-10 DIAGNOSIS — E038 Other specified hypothyroidism: Secondary | ICD-10-CM

## 2016-03-10 MED ORDER — LEVOTHYROXINE SODIUM 100 MCG PO TABS: 100.0000 ug | ORAL_TABLET | Freq: Every day | ORAL | 0 refills | Status: DC

## 2016-03-15 DIAGNOSIS — D631 Anemia in chronic kidney disease: Secondary | ICD-10-CM | POA: Diagnosis not present

## 2016-03-15 DIAGNOSIS — N189 Chronic kidney disease, unspecified: Secondary | ICD-10-CM | POA: Diagnosis not present

## 2016-03-15 DIAGNOSIS — N2581 Secondary hyperparathyroidism of renal origin: Secondary | ICD-10-CM | POA: Diagnosis not present

## 2016-03-15 DIAGNOSIS — N183 Chronic kidney disease, stage 3 (moderate): Secondary | ICD-10-CM | POA: Diagnosis not present

## 2016-03-15 DIAGNOSIS — I10 Essential (primary) hypertension: Secondary | ICD-10-CM | POA: Diagnosis not present

## 2016-03-17 ENCOUNTER — Other Ambulatory Visit: Payer: Self-pay | Admitting: Nephrology

## 2016-03-17 DIAGNOSIS — N183 Chronic kidney disease, stage 3 unspecified: Secondary | ICD-10-CM

## 2016-03-19 ENCOUNTER — Ambulatory Visit
Admission: RE | Admit: 2016-03-19 | Discharge: 2016-03-19 | Disposition: A | Discharge status: Home / Self Care | Payer: Medicare Other | Source: Ambulatory Visit | Attending: Nephrology | Admitting: Nephrology

## 2016-03-19 DIAGNOSIS — N183 Chronic kidney disease, stage 3 unspecified: Secondary | ICD-10-CM

## 2016-03-19 DIAGNOSIS — N189 Chronic kidney disease, unspecified: Secondary | ICD-10-CM | POA: Diagnosis not present

## 2016-03-29 ENCOUNTER — Other Ambulatory Visit: Payer: Self-pay | Admitting: Pharmacist

## 2016-03-29 NOTE — Patient Outreach (Signed)
Outreach call to Alyssa Jefferson regarding her request for follow up from the [facility] Medication Adherence Campaign. Left a HIPAA compliant message on the patient's voicemail.   Harlow Asa, PharmD Clinical Pharmacist [facility] [phone]

## 2016-04-28 DIAGNOSIS — I10 Essential (primary) hypertension: Secondary | ICD-10-CM | POA: Diagnosis not present

## 2016-04-28 DIAGNOSIS — I129 Hypertensive chronic kidney disease with stage 1 through stage 4 chronic kidney disease, or unspecified chronic kidney disease: Secondary | ICD-10-CM | POA: Diagnosis not present

## 2016-04-28 DIAGNOSIS — N183 Chronic kidney disease, stage 3 (moderate): Secondary | ICD-10-CM | POA: Diagnosis not present

## 2016-04-28 DIAGNOSIS — E782 Mixed hyperlipidemia: Secondary | ICD-10-CM | POA: Diagnosis not present

## 2016-05-07 ENCOUNTER — Other Ambulatory Visit: Payer: Self-pay | Admitting: Emergency Medicine

## 2016-05-07 DIAGNOSIS — E038 Other specified hypothyroidism: Secondary | ICD-10-CM

## 2016-05-13 DIAGNOSIS — N183 Chronic kidney disease, stage 3 (moderate): Secondary | ICD-10-CM | POA: Diagnosis not present

## 2016-05-13 DIAGNOSIS — I10 Essential (primary) hypertension: Secondary | ICD-10-CM | POA: Diagnosis not present

## 2016-05-13 DIAGNOSIS — N2581 Secondary hyperparathyroidism of renal origin: Secondary | ICD-10-CM | POA: Diagnosis not present

## 2016-05-13 DIAGNOSIS — D631 Anemia in chronic kidney disease: Secondary | ICD-10-CM | POA: Diagnosis not present

## 2016-06-02 ENCOUNTER — Other Ambulatory Visit: Payer: Self-pay | Admitting: Emergency Medicine

## 2016-06-02 DIAGNOSIS — E038 Other specified hypothyroidism: Secondary | ICD-10-CM

## 2016-06-06 ENCOUNTER — Other Ambulatory Visit: Payer: Self-pay | Admitting: Emergency Medicine

## 2016-06-06 DIAGNOSIS — E038 Other specified hypothyroidism: Secondary | ICD-10-CM

## 2016-06-17 ENCOUNTER — Other Ambulatory Visit: Payer: Self-pay | Admitting: Emergency Medicine

## 2016-06-17 DIAGNOSIS — E038 Other specified hypothyroidism: Secondary | ICD-10-CM

## 2016-06-18 ENCOUNTER — Other Ambulatory Visit: Payer: Self-pay | Admitting: Emergency Medicine

## 2016-06-18 DIAGNOSIS — H401112 Primary open-angle glaucoma, right eye, moderate stage: Secondary | ICD-10-CM | POA: Diagnosis not present

## 2016-06-18 DIAGNOSIS — H401121 Primary open-angle glaucoma, left eye, mild stage: Secondary | ICD-10-CM | POA: Diagnosis not present

## 2016-06-18 DIAGNOSIS — H04123 Dry eye syndrome of bilateral lacrimal glands: Secondary | ICD-10-CM | POA: Diagnosis not present

## 2016-06-18 DIAGNOSIS — E038 Other specified hypothyroidism: Secondary | ICD-10-CM

## 2016-06-18 DIAGNOSIS — H25813 Combined forms of age-related cataract, bilateral: Secondary | ICD-10-CM | POA: Diagnosis not present

## 2016-06-21 ENCOUNTER — Other Ambulatory Visit: Payer: Self-pay | Admitting: Emergency Medicine

## 2016-06-21 DIAGNOSIS — E038 Other specified hypothyroidism: Secondary | ICD-10-CM

## 2016-06-23 NOTE — Telephone Encounter (Signed)
01/2016 last ov 12/2015 last labs 09/2014 last nl tsh

## 2016-06-23 NOTE — Telephone Encounter (Signed)
Pt advised.

## 2016-06-23 NOTE — Telephone Encounter (Signed)
It has been over a year since she had thyroid labs.  We will need to see her back.  Philis Fendt, MS, PA-C 11:49 AM, 06/23/2016

## 2016-07-30 ENCOUNTER — Encounter: Payer: Self-pay | Admitting: Family Medicine

## 2016-07-30 DIAGNOSIS — Z1231 Encounter for screening mammogram for malignant neoplasm of breast: Secondary | ICD-10-CM | POA: Diagnosis not present

## 2016-07-30 DIAGNOSIS — Z853 Personal history of malignant neoplasm of breast: Secondary | ICD-10-CM | POA: Diagnosis not present

## 2016-08-02 DIAGNOSIS — N183 Chronic kidney disease, stage 3 (moderate): Secondary | ICD-10-CM | POA: Diagnosis not present

## 2016-08-02 DIAGNOSIS — I10 Essential (primary) hypertension: Secondary | ICD-10-CM | POA: Diagnosis not present

## 2016-08-02 DIAGNOSIS — E782 Mixed hyperlipidemia: Secondary | ICD-10-CM | POA: Diagnosis not present

## 2016-08-02 DIAGNOSIS — I129 Hypertensive chronic kidney disease with stage 1 through stage 4 chronic kidney disease, or unspecified chronic kidney disease: Secondary | ICD-10-CM | POA: Diagnosis not present

## 2016-08-13 DIAGNOSIS — I10 Essential (primary) hypertension: Secondary | ICD-10-CM | POA: Diagnosis not present

## 2016-08-13 DIAGNOSIS — E782 Mixed hyperlipidemia: Secondary | ICD-10-CM | POA: Diagnosis not present

## 2016-08-19 ENCOUNTER — Ambulatory Visit (INDEPENDENT_AMBULATORY_CARE_PROVIDER_SITE_OTHER): Payer: Medicare Other | Admitting: Physician Assistant

## 2016-08-19 VITALS — BP 152/80 | HR 68 | Temp 97.4°F | Resp 16 | Ht 62.0 in | Wt 232.0 lb

## 2016-08-19 DIAGNOSIS — K5900 Constipation, unspecified: Secondary | ICD-10-CM

## 2016-08-19 DIAGNOSIS — R109 Unspecified abdominal pain: Secondary | ICD-10-CM

## 2016-08-19 DIAGNOSIS — Z96652 Presence of left artificial knee joint: Secondary | ICD-10-CM | POA: Diagnosis not present

## 2016-08-19 DIAGNOSIS — Z471 Aftercare following joint replacement surgery: Secondary | ICD-10-CM | POA: Diagnosis not present

## 2016-08-19 LAB — POCT URINALYSIS DIP (MANUAL ENTRY)
Bilirubin, UA: NEGATIVE
Blood, UA: NEGATIVE
Glucose, UA: NEGATIVE
Ketones, POC UA: NEGATIVE
Leukocytes, UA: NEGATIVE
Nitrite, UA: NEGATIVE
Protein Ur, POC: 100 — AB
Spec Grav, UA: >=1.03
Urobilinogen, UA: 0.2
pH, UA: 6

## 2016-08-19 LAB — POC MICROSCOPIC URINALYSIS (UMFC): Mucus: ABSENT

## 2016-08-19 MED ORDER — POLYETHYLENE GLYCOL 3350 17 GM/SCOOP PO POWD: 17.0000 g | Freq: Two times a day (BID) | ORAL | 1 refills | Status: DC | PRN

## 2016-08-19 NOTE — Progress Notes (Signed)
[facility] [address], [place] [address] [phone]  Date:  08/19/2016   Name:  Alyssa Jefferson   DOB:  11-Feb-1946   MRN:  [id number]  PCP:  Jenny Reichmann, MD    History of Present Illness:  Alyssa Jefferson is a 71 y.o. female patient who presents to [facility] for cc of abdominal pain, nausea, and constipation.   -- about 10 days, of abdominal pain.  Hard stools wit 3-4 small bowel episodes.  Mouth has a bad taste.  She feels very gassy.  Abdominal pain was severe for 2 days and then dissipates.  Pain is mid-abdomen.  Nausea without vomiting.  No blood or black stool.   No dysuria, or hematuria.        Patient Active Problem List   Diagnosis Date Noted  . OA (osteoarthritis) of knee 07/28/2015  . Palpitations 10/17/2014  . OSA (obstructive sleep apnea) 10/16/2012  . BMI 40.0-44.9, adult ([place]) 12/20/2011  . Hypothyroid 12/20/2011  . 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 Osteoarthritis, knee 12/20/2011  . Other and unspecified hyperlipidemia 12/20/2011  . Depressive disorder, not elsewhere classified 12/20/2011  . Unspecified essential hypertension 12/20/2011  . Anemia 12/20/2011  . Cancer [facility])     Past Medical History:  Diagnosis Date  . Arthritis    knees, back  . Cancer [facility]) 2004   breast  . Depression   . GERD (gastroesophageal reflux disease)   . Glaucoma   . Heart palpitations   . High cholesterol   . History of transfusion    many years ago  . Hypertension   . Hypothyroidism   . Itching in the vaginal area   . Nocturia   . OSA (obstructive sleep apnea)    tried CPAP- but unsuccessful  . Renal disorder   . Thyroid disease    thyroid removed    Past Surgical History:  Procedure Laterality Date  . ABDOMINAL HYSTERECTOMY  82   BSO  . BREAST SURGERY  2004   left breast removed due to cancer no chemo or radiation  . KNEE ARTHROSCOPY Left   . MASTECTOMY     LEFT  . thyroid removed     due to knot   . TOTAL KNEE ARTHROPLASTY Left 07/28/2015   Procedure: LEFT TOTAL KNEE ARTHROPLASTY;  Surgeon: Gaynelle Arabian, MD;  Location: [facility];  Service: Orthopedics;  Laterality: Left;    Social History  Substance Use Topics  . Smoking status: Former Smoker    Years: 20.00    Types: Cigarettes    Quit date: 08/16/1978  . Smokeless tobacco: Never Used     Comment: 1 pack per week--10/16/12  . Alcohol use No    Family History  Problem Relation Age of Onset  . Lung cancer Father   . Breast cancer Sister   . Ovarian cancer Sister     Allergies  Allergen Reactions  . Codeine Other (See Comments)    confusion  . Lipitor [Atorvastatin Calcium] Itching    Medication list has been reviewed and updated.  Current Outpatient Prescriptions on File Prior to Visit  Medication Sig Dispense Refill  . fluticasone (FLONASE) 50 MCG/ACT nasal spray Place 1 spray into both nostrils daily as needed for allergies.     Marland Kitchen levothyroxine (SYNTHROID, LEVOTHROID) 100 MCG tablet take 1 tablet by mouth every morning ON AN EMPTY STOMACH.NEED LABS FOR MORE REFILLS 30 tablet 0  . omeprazole (PRILOSEC) 20  MG capsule take 1 capsule by mouth once daily 30 capsule 0  . rosuvastatin (CRESTOR) 10 MG tablet Take 10 mg by mouth at bedtime.     No current facility-administered medications on file prior to visit.     ROS ROS otherwise unremarkable unless listed above.   Physical Examination: BP (!) 150/66   Pulse 68   Temp 97.4 F (36.3 C) (Oral)   Resp 16   Ht '5\' 2"'  (1.575 m)   Wt 232 lb (105.2 kg)   SpO2 97%   BMI 42.43 kg/m  Ideal Body Weight: Weight in (lb) to have BMI = 25: 136.4  Physical Exam  Constitutional: She is oriented to person, place, and time. She appears well-developed and well-nourished. No distress.  HENT:  Head: Normocephalic and atraumatic.  Right Ear: External ear normal.  Left Ear: External ear normal.  Eyes: Conjunctivae and EOM are normal. Pupils are equal, round, and reactive to  light.  Cardiovascular: Normal rate.   Pulmonary/Chest: Effort normal. No respiratory distress.  Abdominal: Soft. Normal appearance and bowel sounds are normal. There is no hepatosplenomegaly. There is tenderness in the periumbilical area, suprapubic area and left lower quadrant. There is no tenderness at McBurney's point and negative Murphy's sign.  Percussion consistent with bowel   Neurological: She is alert and oriented to person, place, and time.  Skin: She is not diaphoretic.  Psychiatric: She has a normal mood and affect. Her behavior is normal.     Assessment and Plan: Alyssa Jefferson is a 71 y.o. female who is here today for cc of abdominal pain, nausea, and constipation.  Advised to start miralax regimen.   Call home with results Constipation, unspecified constipation type - Plan: polyethylene glycol powder (GLYCOLAX/MIRALAX) powder  Abdominal pain, unspecified abdominal location - Plan: CMP14+EGFR, TSH, POCT urinalysis dipstick, POCT Microscopic Urinalysis ([facility]), polyethylene glycol powder (GLYCOLAX/MIRALAX) powder  Ivar Drape, PA-C [facility] 1/7/20188:13 PM

## 2016-08-19 NOTE — Patient Instructions (Addendum)
I would like you to hydrate well with water, 64 oz of water per day, almost 4 regular sized water bottles. Please return if your symptoms do not improve.  Please take the miralax drink, twice per day for no longer than one week.  You may take it once per week thereafter if needed.   Constipation, Adult Constipation is when a person:  Poops (has a bowel movement) fewer times in a week than normal.  Has a hard time pooping.  Has poop that is dry, hard, or bigger than normal. Follow these instructions at home: Eating and drinking  Eat foods that have a lot of fiber, such as:  Fresh fruits and vegetables.  Whole grains.  Beans.  Eat less of foods that are high in fat, low in fiber, or overly processed, such as:  [place] fries.  Hamburgers.  Cookies.  Candy.  Soda.  Drink enough fluid to keep your pee (urine) clear or pale yellow. General instructions  Exercise regularly or as told by your doctor.  Go to the restroom when you feel like you need to poop. Do not hold it in.  Take over-the-counter and prescription medicines only as told by your doctor. These include any fiber supplements.  Do pelvic floor retraining exercises, such as:  Doing deep breathing while relaxing your lower belly (abdomen).  Relaxing your pelvic floor while pooping.  Watch your condition for any changes.  Keep all follow-up visits as told by your doctor. This is important. Contact a doctor if:  You have pain that gets worse.  You have a fever.  You have not pooped for 4 days.  You throw up (vomit).  You are not hungry.  You lose weight.  You are bleeding from the anus.  You have thin, pencil-like poop (stool). Get help right away if:  You have a fever, and your symptoms suddenly get worse.  You leak poop or have blood in your poop.  Your belly feels hard or bigger than normal (is bloated).  You have very bad belly pain.  You feel dizzy or you faint. This information is  not intended to replace advice given to you by your health care provider. Make sure you discuss any questions you have with your health care provider. Document Released: 01/19/2008 Document Revised: 02/20/2016 Document Reviewed: 01/21/2016 Elsevier Interactive Patient Education  2017 [employer].    IF you received an x-ray today, you will receive an invoice from [facility]. Please contact [facility] at [phone] with questions or concerns regarding your invoice.   IF you received labwork today, you will receive an invoice from [facility]. Please contact [facility] at [phone] with questions or concerns regarding your invoice.   Our billing staff will not be able to assist you with questions regarding bills from these companies.  You will be contacted with the lab results as soon as they are available. The fastest way to get your results is to activate your My Chart account. Instructions are located on the last page of this paperwork. If you have not heard from [place] regarding the results in 2 weeks, please contact this office.

## 2016-08-20 LAB — CMP14+EGFR
ALT: 14 IU/L (ref 0–32)
AST: 14 IU/L (ref 0–40)
Albumin/Globulin Ratio: 1.3 (ref 1.2–2.2)
Albumin: 4.1 g/dL (ref 3.6–4.8)
Alkaline Phosphatase: 93 IU/L (ref 39–117)
BUN/Creatinine Ratio: 14 (ref 12–28)
BUN: 15 mg/dL (ref 8–27)
Bilirubin Total: 0.5 mg/dL (ref 0.0–1.2)
CO2: 25 mmol/L (ref 18–29)
Calcium: 9.5 mg/dL (ref 8.7–10.3)
Chloride: 100 mmol/L (ref 96–106)
Creatinine, Ser: 1.1 mg/dL — ABNORMAL HIGH (ref 0.57–1.00)
GFR calc Af Amer: 59 mL/min/{1.73_m2} — ABNORMAL LOW (ref >59)
GFR calc non Af Amer: 51 mL/min/{1.73_m2} — ABNORMAL LOW (ref >59)
Globulin, Total: 3.2 g/dL (ref 1.5–4.5)
Glucose: 103 mg/dL — ABNORMAL HIGH (ref 65–99)
Potassium: 3.9 mmol/L (ref 3.5–5.2)
Sodium: 141 mmol/L (ref 134–144)
Total Protein: 7.3 g/dL (ref 6.0–8.5)

## 2016-08-20 LAB — TSH: TSH: 0.178 u[IU]/mL — ABNORMAL LOW (ref 0.450–4.500)

## 2016-09-13 ENCOUNTER — Other Ambulatory Visit: Payer: Self-pay | Admitting: Physician Assistant

## 2016-09-13 DIAGNOSIS — E039 Hypothyroidism, unspecified: Secondary | ICD-10-CM

## 2016-09-13 MED ORDER — LEVOTHYROXINE SODIUM 75 MCG PO TABS: 75.0000 ug | ORAL_TABLET | Freq: Every day | ORAL | 5 refills | Status: DC

## 2016-09-20 ENCOUNTER — Ambulatory Visit (INDEPENDENT_AMBULATORY_CARE_PROVIDER_SITE_OTHER): Payer: Medicare Other | Admitting: Urgent Care

## 2016-09-20 VITALS — BP 152/78 | HR 70 | Temp 98.6°F | Resp 18 | Ht 62.0 in | Wt 233.0 lb

## 2016-09-20 DIAGNOSIS — R1013 Epigastric pain: Secondary | ICD-10-CM

## 2016-09-20 DIAGNOSIS — R350 Frequency of micturition: Secondary | ICD-10-CM

## 2016-09-20 DIAGNOSIS — Z8719 Personal history of other diseases of the digestive system: Secondary | ICD-10-CM

## 2016-09-20 DIAGNOSIS — J3089 Other allergic rhinitis: Secondary | ICD-10-CM | POA: Diagnosis not present

## 2016-09-20 DIAGNOSIS — E669 Obesity, unspecified: Secondary | ICD-10-CM

## 2016-09-20 DIAGNOSIS — D649 Anemia, unspecified: Secondary | ICD-10-CM

## 2016-09-20 MED ORDER — ESOMEPRAZOLE MAGNESIUM 40 MG PO CPDR: 40.0000 mg | DELAYED_RELEASE_CAPSULE | Freq: Every day | ORAL | 3 refills | Status: DC

## 2016-09-20 MED ORDER — CETIRIZINE HCL 10 MG PO TABS
10.0000 mg | ORAL_TABLET | Freq: Every day | ORAL | 11 refills | Status: DC
Start: 2016-09-20 — End: 2017-08-29

## 2016-09-20 MED ORDER — RANITIDINE HCL 150 MG PO TABS: 150.0000 mg | ORAL_TABLET | Freq: Two times a day (BID) | ORAL | 0 refills | Status: DC

## 2016-09-20 NOTE — Patient Instructions (Signed)
Food Choices for Gastroesophageal Reflux Disease, Adult When you have gastroesophageal reflux disease (GERD), the foods you eat and your eating habits are very important. Choosing the right foods can help ease your discomfort. What guidelines do I need to follow?  Choose fruits, vegetables, whole grains, and low-fat dairy products.  Choose low-fat meat, fish, and poultry.  Limit fats such as oils, salad dressings, butter, nuts, and avocado.  Keep a food diary. This helps you identify foods that cause symptoms.  Avoid foods that cause symptoms. These may be different for everyone.  Eat small meals often instead of 3 large meals a day.  Eat your meals slowly, in a place where you are relaxed.  Limit fried foods.  Cook foods using methods other than frying.  Avoid drinking alcohol.  Avoid drinking large amounts of liquids with your meals.  Avoid bending over or lying down until 2-3 hours after eating. What foods are not recommended? These are some foods and drinks that may make your symptoms worse: Vegetables  Tomatoes. Tomato juice. Tomato and spaghetti sauce. Chili peppers. Onion and garlic. Horseradish. Fruits  Oranges, grapefruit, and lemon (fruit and juice). Meats  High-fat meats, fish, and poultry. This includes hot dogs, ribs, ham, sausage, salami, and bacon. Dairy  Whole milk and chocolate milk. Sour cream. Cream. Butter. Ice cream. Cream cheese. Drinks  Coffee and tea. Bubbly (carbonated) drinks or energy drinks. Condiments  Hot sauce. Barbecue sauce. Sweets/Desserts  Chocolate and cocoa. Donuts. Peppermint and spearmint. Fats and Oils  High-fat foods. This includes French fries and potato chips. Other  Vinegar. Strong spices. This includes black pepper, white pepper, red pepper, cayenne, curry powder, cloves, ginger, and chili powder. The items listed above may not be a complete list of foods and drinks to avoid. Contact your dietitian for more information.    This information is not intended to replace advice given to you by your health care provider. Make sure you discuss any questions you have with your health care provider. Document Released: 02/01/2012 Document Revised: 01/08/2016 Document Reviewed: 06/06/2013 Elsevier Interactive Patient Education  2017 [employer] Inc.  

## 2016-09-20 NOTE — Progress Notes (Signed)
  MRN: [id number] DOB: May 01, 1946  Subjective:   Alyssa Jefferson is a 71 y.o. female presenting for chief complaint of Abdominal Pain and Epistaxis (Sinus infection )  Reports 1 day history of recurrent epigastric pain, feels sour brash then. Has associated nausea without vomiting and pain is elicited after eating. Has had this problem intermittently for years. Denies fevers, sore throat, diarrhea, loose stools, bloody stools. She also would like to address intermittent nasal congestion and blowing her nose with bloody mucus. She has a history of allergies but is not using allergy medications now. She has used nasal steroid in the past but stopped this due to intermittent nasal bleeding.   Alyssa Jefferson has a current medication list which includes the following prescription(s): diltiazem, fluticasone, levothyroxine, omeprazole, polyethylene glycol powder, rosuvastatin, and spironolactone. Also is allergic to codeine and lipitor [atorvastatin calcium].  Alyssa Jefferson  has a past medical history of Arthritis; Cancer [facility]) (2004); Depression; GERD (gastroesophageal reflux disease); Glaucoma; Heart palpitations; High cholesterol; History of transfusion; Hypertension; Hypothyroidism; Itching in the vaginal area; Nocturia; OSA (obstructive sleep apnea); Renal disorder; and Thyroid disease. 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  Objective:   Vitals: BP (!) 152/78   Pulse 70   Temp 98.6 F (37 C) (Oral)   Resp 18   Ht 5\' 2"  (1.575 m)   Wt 233 lb (105.7 kg)   SpO2 97%   BMI 42.62 kg/m   Physical Exam  Constitutional: She is oriented to person, place, and time. She appears well-developed and well-nourished.  HENT:  TM's flat but intact bilaterally, no effusions or erythema. Nasal turbinates boggy and edematous, nasal passages patent. No sinus  tenderness. Oropharynx with moderate post-nasal drainage, mucous membranes moist, dentition in good repair.  Cardiovascular: Normal rate, regular rhythm and intact distal pulses.  Exam reveals no gallop and no friction rub.   No murmur heard. Pulmonary/Chest: No respiratory distress. She has no wheezes. She has no rales.  Abdominal: Soft. Bowel sounds are normal. She exhibits no distension and no mass. There is tenderness (epigastric). There is no guarding.  Neurological: She is alert and oriented to person, place, and time.  Skin: Skin is warm and dry.   Assessment and Plan :   1. Abdominal pain, epigastric 2. Anemia, unspecified type 3. History of pancreatitis - Labs pending. Differential includes H. Pylori, GERD, PUD, GI bleed. Patient will start Nexium with Zantac. Follow up with labs.  4. Environmental and seasonal allergies - Start Zyrtec for better allergy management.  5. Urinary frequency 6. Obesity, unspecified classification, unspecified obesity type, unspecified whether serious comorbidity present - A1c pending.  Alyssa Eagles, PA-C [facility] [phone] 09/20/2016  3:24 PM

## 2016-09-21 LAB — CBC
Hematocrit: 38.5 % (ref 34.0–46.6)
Hemoglobin: 11.8 g/dL (ref 11.1–15.9)
MCH: 24.8 pg — ABNORMAL LOW (ref 26.6–33.0)
MCHC: 30.6 g/dL — ABNORMAL LOW (ref 31.5–35.7)
MCV: 81 fL (ref 79–97)
Platelets: 352 10*3/uL (ref 150–379)
RBC: 4.76 x10E6/uL (ref 3.77–5.28)
RDW: 15.9 % — ABNORMAL HIGH (ref 12.3–15.4)
WBC: 7 10*3/uL (ref 3.4–10.8)

## 2016-09-21 LAB — COMPREHENSIVE METABOLIC PANEL
ALT: 110 IU/L — ABNORMAL HIGH (ref 0–32)
AST: 169 IU/L — ABNORMAL HIGH (ref 0–40)
Albumin/Globulin Ratio: 1.3 (ref 1.2–2.2)
Albumin: 4.1 g/dL (ref 3.6–4.8)
Alkaline Phosphatase: 227 IU/L — ABNORMAL HIGH (ref 39–117)
BUN/Creatinine Ratio: 17 (ref 12–28)
BUN: 20 mg/dL (ref 8–27)
Bilirubin Total: 0.9 mg/dL (ref 0.0–1.2)
CO2: 24 mmol/L (ref 18–29)
Calcium: 9.4 mg/dL (ref 8.7–10.3)
Chloride: 100 mmol/L (ref 96–106)
Creatinine, Ser: 1.19 mg/dL — ABNORMAL HIGH (ref 0.57–1.00)
GFR calc Af Amer: 54 mL/min/{1.73_m2} — ABNORMAL LOW (ref >59)
GFR calc non Af Amer: 47 mL/min/{1.73_m2} — ABNORMAL LOW (ref >59)
Globulin, Total: 3.2 g/dL (ref 1.5–4.5)
Glucose: 93 mg/dL (ref 65–99)
Potassium: 4.2 mmol/L (ref 3.5–5.2)
Sodium: 143 mmol/L (ref 134–144)
Total Protein: 7.3 g/dL (ref 6.0–8.5)

## 2016-09-21 LAB — FERRITIN: Ferritin: 148 ng/mL (ref 15–150)

## 2016-09-21 LAB — LIPASE: Lipase: 26 U/L (ref 14–72)

## 2016-09-22 ENCOUNTER — Other Ambulatory Visit: Payer: Self-pay | Admitting: Urgent Care

## 2016-09-22 DIAGNOSIS — R1013 Epigastric pain: Secondary | ICD-10-CM

## 2016-09-22 DIAGNOSIS — R945 Abnormal results of liver function studies: Secondary | ICD-10-CM

## 2016-09-22 DIAGNOSIS — R7989 Other specified abnormal findings of blood chemistry: Secondary | ICD-10-CM

## 2016-09-23 ENCOUNTER — Ambulatory Visit
Admission: RE | Admit: 2016-09-23 | Discharge: 2016-09-23 | Disposition: A | Discharge status: Home / Self Care | Payer: Medicare Other | Source: Ambulatory Visit | Attending: Urgent Care | Admitting: Urgent Care

## 2016-09-23 DIAGNOSIS — R1013 Epigastric pain: Secondary | ICD-10-CM

## 2016-09-23 DIAGNOSIS — R7989 Other specified abnormal findings of blood chemistry: Secondary | ICD-10-CM

## 2016-09-23 DIAGNOSIS — R945 Abnormal results of liver function studies: Secondary | ICD-10-CM

## 2016-09-23 LAB — H.PYLORI BREATH TEST (REFLEX): H. pylori Breath Test: NEGATIVE

## 2016-09-23 LAB — H. PYLORI BREATH TEST

## 2016-09-23 NOTE — Progress Notes (Addendum)
U/s scheduled today at GI 240 pm [address] Npo now til appt. Pt advised

## 2016-09-24 LAB — SPECIMEN STATUS REPORT

## 2016-09-24 LAB — HEMOGLOBIN A1C
Est. average glucose Bld gHb Est-mCnc: 117 mg/dL
Hgb A1c MFr Bld: 5.7 % — ABNORMAL HIGH (ref 4.8–5.6)

## 2016-09-24 LAB — HEPATITIS B SURFACE ANTIGEN: Hepatitis B Surface Ag: NEGATIVE

## 2016-09-24 LAB — HEPATITIS B SURFACE ANTIBODY, QUANTITATIVE: Hepatitis B Surf Ab Quant: <3.1 m[IU]/mL — ABNORMAL LOW (ref >9.9)

## 2016-09-24 LAB — HEPATITIS C ANTIBODY: Hep C Virus Ab: <0.1 s/co ratio (ref 0.0–0.9)

## 2016-09-25 LAB — PATHOLOGIST SMEAR REVIEW
Basophils Absolute: 0 10*3/uL (ref 0.0–0.2)
Basos: 0 %
EOS (ABSOLUTE): 0.2 10*3/uL (ref 0.0–0.4)
Eos: 3 %
Hematocrit: 37.1 % (ref 34.0–46.6)
Hemoglobin: 12.1 g/dL (ref 11.1–15.9)
Immature Grans (Abs): 0 10*3/uL (ref 0.0–0.1)
Immature Granulocytes: 0 %
Lymphocytes Absolute: 1.7 10*3/uL (ref 0.7–3.1)
Lymphs: 23 %
MCH: 25.5 pg — ABNORMAL LOW (ref 26.6–33.0)
MCHC: 32.6 g/dL (ref 31.5–35.7)
MCV: 78 fL — ABNORMAL LOW (ref 79–97)
Monocytes Absolute: 0.5 10*3/uL (ref 0.1–0.9)
Monocytes: 6 %
Neutrophils Absolute: 4.9 10*3/uL (ref 1.4–7.0)
Neutrophils: 68 %
Path Rev PLTs: NORMAL
Path Rev RBC: NORMAL
Path Rev WBC: NORMAL
Platelets: 351 10*3/uL (ref 150–379)
RBC: 4.75 x10E6/uL (ref 3.77–5.28)
RDW: 14.9 % (ref 12.3–15.4)
WBC: 7.3 10*3/uL (ref 3.4–10.8)

## 2016-09-27 ENCOUNTER — Other Ambulatory Visit: Payer: Self-pay | Admitting: Urgent Care

## 2016-09-27 DIAGNOSIS — R1013 Epigastric pain: Secondary | ICD-10-CM

## 2016-09-27 DIAGNOSIS — R945 Abnormal results of liver function studies: Secondary | ICD-10-CM

## 2016-09-27 DIAGNOSIS — R7989 Other specified abnormal findings of blood chemistry: Secondary | ICD-10-CM

## 2016-10-29 ENCOUNTER — Encounter: Payer: Self-pay | Admitting: Family Medicine

## 2016-10-29 DIAGNOSIS — N2581 Secondary hyperparathyroidism of renal origin: Secondary | ICD-10-CM | POA: Diagnosis not present

## 2016-10-29 DIAGNOSIS — N183 Chronic kidney disease, stage 3 (moderate): Secondary | ICD-10-CM | POA: Diagnosis not present

## 2016-11-04 DIAGNOSIS — N183 Chronic kidney disease, stage 3 (moderate): Secondary | ICD-10-CM | POA: Diagnosis not present

## 2016-11-04 DIAGNOSIS — D631 Anemia in chronic kidney disease: Secondary | ICD-10-CM | POA: Diagnosis not present

## 2016-11-04 DIAGNOSIS — N2581 Secondary hyperparathyroidism of renal origin: Secondary | ICD-10-CM | POA: Diagnosis not present

## 2016-11-04 DIAGNOSIS — I10 Essential (primary) hypertension: Secondary | ICD-10-CM | POA: Diagnosis not present

## 2016-11-15 ENCOUNTER — Other Ambulatory Visit: Payer: Self-pay | Admitting: Physician Assistant

## 2016-11-15 ENCOUNTER — Other Ambulatory Visit: Payer: Self-pay | Admitting: Urgent Care

## 2016-11-16 ENCOUNTER — Telehealth: Payer: Self-pay | Admitting: Physician Assistant

## 2016-11-16 ENCOUNTER — Ambulatory Visit (INDEPENDENT_AMBULATORY_CARE_PROVIDER_SITE_OTHER): Payer: Medicare Other | Admitting: Physician Assistant

## 2016-11-16 VITALS — BP 132/70 | HR 65 | Temp 98.1°F | Resp 16 | Ht 61.5 in | Wt 232.4 lb

## 2016-11-16 DIAGNOSIS — F331 Major depressive disorder, recurrent, moderate: Secondary | ICD-10-CM

## 2016-11-16 MED ORDER — FLUOXETINE HCL 20 MG PO TABS: 20.0000 mg | ORAL_TABLET | Freq: Every day | ORAL | 3 refills | Status: DC

## 2016-11-16 NOTE — Telephone Encounter (Signed)
Called switched to capsules

## 2016-11-16 NOTE — Telephone Encounter (Signed)
Can you call and advise that the patient can have capsules or tabs, which ever is cheaper. Philis Fendt, MS, PA-C 6:47 PM, 11/16/2016

## 2016-11-16 NOTE — Progress Notes (Signed)
11/16/2016 2:38 PM   DOB: [birth year] / MRN: [id number]  SUBJECTIVE:  Alyssa Jefferson is a 71 y.o. female presenting for sadness.  This started three years ago when she lost her brother, husband and another loved one.  She goes to sleep without difficulty but wakes up to pee and is able to go back to sleep. Has difficulty with concentration.  Eating more.  Cries a few minutes per month, however used to be every day. Feels sad most of times.  She used to enjoy walking and going places but stays at home most often now. Denies any suicidal thoughts.   Depression screen PHQ 2/9 11/16/2016  Decreased Interest 3  Down, Depressed, Hopeless 3  PHQ - 2 Score 6  Altered sleeping 3  Tired, decreased energy 3  Change in appetite 2  Feeling bad or failure about yourself  1  Trouble concentrating 3  Moving slowly or fidgety/restless 1  Suicidal thoughts 0  PHQ-9 Score 19  Difficult doing work/chores Very difficult     She is allergic to codeine and lipitor [atorvastatin calcium].   She  has a past medical history of Arthritis; Cancer [facility]) (2004); Depression; GERD (gastroesophageal reflux disease); Glaucoma; Heart palpitations; High cholesterol; History of transfusion; Hypertension; Hypothyroidism; Itching in the vaginal area; Nocturia; OSA (obstructive sleep apnea); Renal disorder; and Thyroid disease.    She  reports that she quit smoking about 38 years ago. Her smoking use included Cigarettes. She quit after 20.00 years of use. She has never used smokeless tobacco. She reports that she does not drink alcohol or use drugs. She  reports that she does not engage in sexual activity. The patient  has a past surgical history that includes Abdominal hysterectomy (83); thyroid removed; Knee arthroscopy (Left); Breast surgery (2004); Mastectomy; and Total knee arthroplasty (Left, 07/28/2015).  Her family history includes Breast cancer in her sister; Lung cancer in her father; Ovarian cancer in her  sister.  Review of Systems  Constitutional: Negative for chills, diaphoresis and fever.  Gastrointestinal: Negative for nausea.  Skin: Negative for rash.  Neurological: Negative for dizziness.  Psychiatric/Behavioral: Positive for depression. Negative for hallucinations, memory loss, substance abuse and suicidal ideas. The patient is nervous/anxious. The patient does not have insomnia.     The problem list and medications were reviewed and updated by myself where necessary and exist elsewhere in the encounter.   OBJECTIVE:  BP 132/70   Pulse 65   Temp 98.1 F (36.7 C) (Oral)   Resp 16   Ht 5' 1.5" (1.562 m)   Wt 232 lb 6.4 oz (105.4 kg)   SpO2 100%   BMI 43.20 kg/m   Physical Exam  Constitutional: She is active.  Non-toxic appearance.  Cardiovascular: Normal rate.   Pulmonary/Chest: Effort normal. No tachypnea.  Neurological: She is alert.  Skin: Skin is warm and dry. She is not diaphoretic. No pallor.    Lab Results  Component Value Date   WBC 7.0 09/20/2016   WBC 7.3 09/20/2016   HGB 11.2 (L) 01/11/2016   HCT 38.5 09/20/2016   HCT 37.1 09/20/2016   MCV 81 09/20/2016   MCV 78 (L) 09/20/2016   PLT 352 09/20/2016   PLT 351 09/20/2016    Lab Results  Component Value Date   NA 143 09/20/2016   K 4.2 09/20/2016   CL 100 09/20/2016   CO2 24 09/20/2016    Lab Results  Component Value Date   CREATININE 1.19 (H) 09/20/2016  Lab Results  Component Value Date   ALT 110 (H) 09/20/2016   AST 169 (H) 09/20/2016   ALKPHOS 227 (H) 09/20/2016   BILITOT 0.9 09/20/2016    Lab Results  Component Value Date   TSH 0.178 (L) 08/19/2016    Lab Results  Component Value Date   HGBA1C 5.7 (H) 09/20/2016      No results found for this or any previous visit (from the past 72 hour(s)).  No results found.  ASSESSMENT AND PLAN:  Alyssa Jefferson was seen today for trouble concentrating and depression.  Diagnoses and all orders for this visit:  Moderate recurrent major  depression ([place]) Comments: She does not want couseling.  will start prozac and see her back in 4 weeks for medication titriation. Stressed importance of medication compliance. She will move her spironolactone to the evening as there is about a 7 hour delay between taking the tab and her onset of diuresis.  Orders: -     FLUoxetine (PROZAC) 20 MG tablet; Take 1 tablet (20 mg total) by mouth daily.    The patient is advised to call or return to clinic if she does not see an improvement in symptoms, or to seek the care of the closest emergency department if she worsens with the above plan.   Philis Fendt, MHS, PA-C [facility] 11/16/2016 2:38 PM

## 2016-11-16 NOTE — Patient Instructions (Signed)
     IF you received an x-ray today, you will receive an invoice from [facility]. Please contact [facility] at [phone] with questions or concerns regarding your invoice.   IF you received labwork today, you will receive an invoice from [facility]. Please contact [facility] at [phone] with questions or concerns regarding your invoice.   Our billing staff will not be able to assist you with questions regarding bills from these companies.  You will be contacted with the lab results as soon as they are available. The fastest way to get your results is to activate your My Chart account. Instructions are located on the last page of this paperwork. If you have not heard from us regarding the results in 2 weeks, please contact this office.     

## 2016-11-16 NOTE — Telephone Encounter (Signed)
Anti depressant rx today is to expensive, can you send in a cheaper one today?

## 2016-11-26 ENCOUNTER — Encounter: Payer: Self-pay | Admitting: Urgent Care

## 2016-12-04 ENCOUNTER — Other Ambulatory Visit: Payer: Self-pay | Admitting: Physician Assistant

## 2016-12-13 ENCOUNTER — Ambulatory Visit: Payer: Self-pay | Admitting: Physician Assistant

## 2016-12-15 ENCOUNTER — Ambulatory Visit (INDEPENDENT_AMBULATORY_CARE_PROVIDER_SITE_OTHER): Payer: Medicare Other | Admitting: Family Medicine

## 2016-12-15 ENCOUNTER — Encounter: Payer: Self-pay | Admitting: Family Medicine

## 2016-12-15 VITALS — BP 140/84 | HR 65 | Temp 98.3°F | Resp 17 | Ht 61.5 in | Wt 230.0 lb

## 2016-12-15 DIAGNOSIS — R7989 Other specified abnormal findings of blood chemistry: Secondary | ICD-10-CM

## 2016-12-15 DIAGNOSIS — I10 Essential (primary) hypertension: Secondary | ICD-10-CM | POA: Diagnosis not present

## 2016-12-15 DIAGNOSIS — F329 Major depressive disorder, single episode, unspecified: Secondary | ICD-10-CM | POA: Diagnosis not present

## 2016-12-15 DIAGNOSIS — E785 Hyperlipidemia, unspecified: Secondary | ICD-10-CM

## 2016-12-15 DIAGNOSIS — R945 Abnormal results of liver function studies: Secondary | ICD-10-CM

## 2016-12-15 DIAGNOSIS — E039 Hypothyroidism, unspecified: Secondary | ICD-10-CM | POA: Diagnosis not present

## 2016-12-15 DIAGNOSIS — F32A Depression, unspecified: Secondary | ICD-10-CM

## 2016-12-15 MED ORDER — ROSUVASTATIN CALCIUM 10 MG PO TABS: 10.0000 mg | ORAL_TABLET | Freq: Every day | ORAL | 1 refills | Status: DC

## 2016-12-15 MED ORDER — DILTIAZEM HCL ER COATED BEADS 120 MG PO CP24: 120.0000 mg | ORAL_CAPSULE | Freq: Every day | ORAL | 1 refills | Status: DC

## 2016-12-15 MED ORDER — SPIRONOLACTONE 50 MG PO TABS: 50.0000 mg | ORAL_TABLET | Freq: Every day | ORAL | 1 refills | Status: DC

## 2016-12-15 MED ORDER — CITALOPRAM HYDROBROMIDE 20 MG PO TABS: 20.0000 mg | ORAL_TABLET | Freq: Every day | ORAL | 1 refills | Status: DC

## 2016-12-15 MED ORDER — LEVOTHYROXINE SODIUM 88 MCG PO TABS: 88.0000 ug | ORAL_TABLET | Freq: Every day | ORAL | 1 refills | Status: DC

## 2016-12-15 NOTE — Patient Instructions (Addendum)
  I will check your thyroid test again today. If it is normal, may need to return to 100 g dose. However based on your last test, I will start you on a slightly lower dose of 88 g once per day and that medication was sent to your pharmacy. Either way, would recommend repeat thyroid testing in the next 6 weeks.  For depression, start Celexa once per day. If still having side effects after the first week or 2, could consider different medication, but it is not common to feel a little different when first taking any new SSRI (antidepressant) medication.  Recheck depression in 3-4 weeks.  I will repeat your liver tests today, but due to the elevation seen previously, it is recommended you follow-up with gastroenterology. I will refer you again, so make sure you return their calls when they do call to try to schedule that appointment.  Blood pressure improved on recheck here in the office. It is still borderline elevated, but would like to remain on same dose of medications for now until recheck in 3-4 weeks. If you have home readings that are over 160/100, return sooner for possible change in medication.  Headaches may be related to thyroid or blood pressure or depression. With change of medications as above, would like to see if that helps your symptoms. If those headaches do not improve within the next week or 2, or any worsening of headache sooner, return for recheck here or emergency room if needed.  Continue Crestor for now, I will recheck your cholesterol tests today. It is possible we may need to hold that medication if liver tests still remain elevated.  Return to the clinic or go to the nearest emergency room if any of your symptoms worsen or new symptoms occur.    IF you received an x-ray today, you will receive an invoice from [facility]. Please contact [facility] at [phone] with questions or concerns regarding your invoice.   IF you received labwork today, you  will receive an invoice from [facility]. Please contact [facility] at [phone] with questions or concerns regarding your invoice.   Our billing staff will not be able to assist you with questions regarding bills from these companies.  You will be contacted with the lab results as soon as they are available. The fastest way to get your results is to activate your My Chart account. Instructions are located on the last page of this paperwork. If you have not heard from [place] regarding the results in 2 weeks, please contact this office.
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
## 2016-12-16 ENCOUNTER — Encounter: Payer: Self-pay | Admitting: Gastroenterology

## 2016-12-16 LAB — COMPREHENSIVE METABOLIC PANEL
ALT: 11 IU/L (ref 0–32)
AST: 10 IU/L (ref 0–40)
Albumin/Globulin Ratio: 1.3 (ref 1.2–2.2)
Albumin: 4.2 g/dL (ref 3.5–4.8)
Alkaline Phosphatase: 88 IU/L (ref 39–117)
BUN/Creatinine Ratio: 18 (ref 12–28)
BUN: 25 mg/dL (ref 8–27)
Bilirubin Total: 0.6 mg/dL (ref 0.0–1.2)
CO2: 25 mmol/L (ref 18–29)
Calcium: 9.7 mg/dL (ref 8.7–10.3)
Chloride: 100 mmol/L (ref 96–106)
Creatinine, Ser: 1.37 mg/dL — ABNORMAL HIGH (ref 0.57–1.00)
GFR calc Af Amer: 45 mL/min/{1.73_m2} — ABNORMAL LOW (ref >59)
GFR calc non Af Amer: 39 mL/min/{1.73_m2} — ABNORMAL LOW (ref >59)
Globulin, Total: 3.2 g/dL (ref 1.5–4.5)
Glucose: 86 mg/dL (ref 65–99)
Potassium: 4.2 mmol/L (ref 3.5–5.2)
Sodium: 138 mmol/L (ref 134–144)
Total Protein: 7.4 g/dL (ref 6.0–8.5)

## 2016-12-16 LAB — LIPID PANEL
Chol/HDL Ratio: 3 ratio (ref 0.0–4.4)
Cholesterol, Total: 216 mg/dL — ABNORMAL HIGH (ref 100–199)
HDL: 72 mg/dL (ref >39)
LDL Calculated: 125 mg/dL — ABNORMAL HIGH (ref 0–99)
Triglycerides: 96 mg/dL (ref 0–149)
VLDL Cholesterol Cal: 19 mg/dL (ref 5–40)

## 2016-12-16 LAB — TSH: TSH: 0.469 u[IU]/mL (ref 0.450–4.500)

## 2017-01-17 ENCOUNTER — Other Ambulatory Visit: Payer: Self-pay | Admitting: Urgent Care

## 2017-01-18 NOTE — Telephone Encounter (Signed)
On both

## 2017-01-19 ENCOUNTER — Encounter: Payer: Self-pay | Admitting: Gastroenterology

## 2017-01-19 ENCOUNTER — Ambulatory Visit (INDEPENDENT_AMBULATORY_CARE_PROVIDER_SITE_OTHER): Payer: Medicare Other | Admitting: Gastroenterology

## 2017-01-19 ENCOUNTER — Encounter (INDEPENDENT_AMBULATORY_CARE_PROVIDER_SITE_OTHER): Payer: Self-pay

## 2017-01-19 VITALS — BP 130/80 | HR 64 | Ht 62.0 in | Wt 229.0 lb

## 2017-01-19 DIAGNOSIS — R1013 Epigastric pain: Secondary | ICD-10-CM | POA: Diagnosis not present

## 2017-01-19 DIAGNOSIS — K5909 Other constipation: Secondary | ICD-10-CM

## 2017-01-19 DIAGNOSIS — R945 Abnormal results of liver function studies: Secondary | ICD-10-CM

## 2017-01-19 DIAGNOSIS — K5730 Diverticulosis of large intestine without perforation or abscess without bleeding: Secondary | ICD-10-CM

## 2017-01-19 DIAGNOSIS — G8929 Other chronic pain: Secondary | ICD-10-CM

## 2017-01-19 DIAGNOSIS — R7989 Other specified abnormal findings of blood chemistry: Secondary | ICD-10-CM

## 2017-01-19 NOTE — Patient Instructions (Addendum)
Stop taking ranitidine (zantac) and esomeprazole (nexium)  We have schedule you a gallbladder ultrasound.  If you are age 70 or older, your body mass index should be between 23-30. Your Body mass index is 41.88 kg/m. If this is out of the aforementioned range listed, please consider follow up with your Primary Care Provider.  If you are age 43 or younger, your body mass index should be between 19-25. Your Body mass index is 41.88 kg/m. If this is out of the aformentioned range listed, please consider follow up with your Primary Care Provider.   You have been scheduled for an abdominal ultrasound at [facility] (1st floor of hospital) on 01-25-2017 at 930am. Please arrive 15 minutes prior to your appointment for registration. Make certain not to have anything to eat or drink 6 hours prior to your appointment. Should you need to reschedule your appointment, please contact radiology at [phone]. This test typically takes about 30 minutes to perform.  Thank you for choosing [facility]  Dr Wilfrid Lund III

## 2017-01-19 NOTE — Progress Notes (Signed)
[facility] Consult Note:  History: Alyssa Jefferson 01/19/2017  Referring physician: Merri Ray M.D.  Reason for consult/chief complaint: Abdominal Pain (crampy lower abdominal pain x several hours at a time; has been occuring x several months; does have some constipation "small balls" at times) and Gastroesophageal Reflux (belching and bloating as well as heartburn; this has been relieved since she has started Nexium and Zantac)   Subjective  HPI:  This is a 71 year old woman referred by the primary care noted above for a constellation of GI symptoms. She has chronic symptoms of reflux with belching bloating and heartburn, but that seems somewhat better after starting accommodation of Nexium and Zantac. She has many years of chronic constipation where she passes little pellets or stool balls. This is only partially relieved with a stool softener and as needed MiraLAX. She had a colonoscopy by Dr. Evette Doffing schooler in December 2015 revealing left-sided diverticulosis and internal hemorrhoids. With seems to be bothering her most is an intermittent sharp epigastric pain which was occurring several months ago. She is a limited and somewhat inconsistent historian. It seems from her description and from a few available primary care notes, that starting in about January she was having some epigastric pain. She recalls it being sharp and in the mid upper abdomen with no clear radiation. She does not recall whether it came on with meals, position, time of day or bowel movements. There is no dysphagia or vomiting. Tessia tells me that she went to the [place] [facility] for it sometime in the last several months, but there are no [facility] ED visits for her since a May 2017 ED visit for hypertension and chest pain. She did have a primary care appointment January 2017 for abdominal pain. She also seems to recall having had an ultrasound several months ago, but in fact her last ultrasound was  in late 2015. She feels as if the antacid medicine she received has made her stomach feel better, but she is not sure if she should still take it" I don't know what they were giving it to me for".  ROS:  Review of Systems  Constitutional: Negative for appetite change and unexpected weight change.  HENT: Negative for mouth sores and voice change.   Eyes: Negative for pain and redness.  Respiratory: Negative for cough and shortness of breath.   Cardiovascular: Negative for chest pain and palpitations.  Genitourinary: Negative for dysuria and hematuria.  Musculoskeletal: Negative for arthralgias and myalgias.  Skin: Negative for pallor and rash.  Neurological: Negative for weakness and headaches.  Hematological: Negative for adenopathy.     Past Medical History: Past Medical History:  Diagnosis Date  . Arthritis    knees, back  . Cancer [facility]) 2004   breast  . Depression   . GERD (gastroesophageal reflux disease)   . Glaucoma   . Heart palpitations   . High cholesterol   . History of transfusion    many years ago  . Hypertension   . Hypothyroidism   . Itching in the vaginal area   . Nocturia   . OSA (obstructive sleep apnea)    tried CPAP- but unsuccessful  . Renal disorder   . Thyroid disease    thyroid removed     Past Surgical History: Past Surgical History:  Procedure Laterality Date  . ABDOMINAL HYSTERECTOMY  83   BSO  . BREAST SURGERY  2004   left breast removed due to cancer no chemo or radiation  .  MASTECTOMY Left   . THYROIDECTOMY     due to knot  . TOTAL KNEE ARTHROPLASTY Left 07/28/2015   Procedure: LEFT TOTAL KNEE ARTHROPLASTY;  Surgeon: Gaynelle Arabian, MD;  Location: [facility];  Service: Orthopedics;  Laterality: Left;     Family History: Family History  Problem Relation Age of Onset  . Lung cancer Father   . Breast cancer Sister   . Ovarian cancer Sister   . Colon cancer Neg Hx   . Esophageal cancer Neg Hx   . Stomach cancer Neg Hx   .  Pancreatic cancer Neg Hx   . Liver disease Neg Hx     Social History: Social History   Social History  . Marital status: Widowed    Spouse name: N/A  . Number of children: N/A  . Years of education: N/A   Occupational History  . retired Retired   Social History Main Topics  . Smoking status: Former Smoker    Years: 20.00    Types: Cigarettes    Quit date: 08/16/1978  . Smokeless tobacco: Never Used     Comment: 1 pack per week--10/16/12  . Alcohol use No  . Drug use: No  . Sexual activity: No   Other Topics Concern  . None   Social History Narrative  . None    Allergies: Allergies  Allergen Reactions  . Codeine Other (See Comments)    confusion  . Lipitor [Atorvastatin Calcium] Itching    Outpatient Meds: Current Outpatient Prescriptions  Medication Sig Dispense Refill  . cetirizine (ZYRTEC) 10 MG tablet Take 1 tablet (10 mg total) by mouth daily. 30 tablet 11  . citalopram (CELEXA) 20 MG tablet Take 1 tablet (20 mg total) by mouth daily. 30 tablet 1  . diltiazem (CARDIZEM CD) 120 MG 24 hr capsule Take 1 capsule (120 mg total) by mouth daily. 90 capsule 1  . esomeprazole (NEXIUM) 40 MG capsule take 1 capsule by mouth once daily 30 capsule 3  . fluticasone (FLONASE) 50 MCG/ACT nasal spray Place 1 spray into both nostrils daily as needed for allergies.     Marland Kitchen latanoprost (XALATAN) 0.005 % ophthalmic solution     . levothyroxine (SYNTHROID, LEVOTHROID) 88 MCG tablet Take 1 tablet (88 mcg total) by mouth daily. 90 tablet 1  . polyethylene glycol powder (GLYCOLAX/MIRALAX) powder Take 17 g by mouth 2 (two) times daily as needed. 289 g 1  . ranitidine (ZANTAC) 150 MG tablet take 1 tablet by mouth twice a day 60 tablet 0  . rosuvastatin (CRESTOR) 10 MG tablet Take 1 tablet (10 mg total) by mouth at bedtime. 90 tablet 1  . spironolactone (ALDACTONE) 50 MG tablet Take 1 tablet (50 mg total) by mouth daily. 90 tablet 1   No current facility-administered medications for this  visit.       ___________________________________________________________________ Objective   Exam:  BP 130/80   Pulse 64   Ht 5\' 2"  (1.575 m)   Wt 229 lb (103.9 kg)   BMI 41.88 kg/m    General: this is a(n) Obese and otherwise well-appearing elderly woman   Eyes: sclera anicteric, no redness  ENT: oral mucosa moist without lesions, no cervical or supraclavicular lymphadenopathy, good dentition  CV: RRR without murmur, S1/S2, no JVD, no peripheral edema  Resp: clear to auscultation bilaterally, normal RR and effort noted  GI: soft, no tenderness, with active bowel sounds. No guarding or palpable organomegaly noted.  Skin; warm and dry, no rash or jaundice noted Neuro:  awake, alert and oriented x 3. Normal gross motor function and fluent speech, antalgic gait   Labs:  CBC Latest Ref Rng & Units 09/20/2016 09/20/2016 01/11/2016  WBC 3.4 - 10.8 x10E3/uL 7.3 7.0 6.6  Hemoglobin 11.1 - 15.9 g/dL 12.1 11.8 11.2(L)  Hematocrit 34.0 - 46.6 % 37.1 38.5 34.7(L)  Platelets 150 - 379 x10E3/uL 351 352 299    CMP Latest Ref Rng & Units 12/15/2016 09/20/2016 08/19/2016  Glucose 65 - 99 mg/dL 86 93 103(H)  BUN 8 - 27 mg/dL 25 20 15   Creatinine 0.57 - 1.00 mg/dL 1.37(H) 1.19(H) 1.10(H)  Sodium 134 - 144 mmol/L 138 143 141  Potassium 3.5 - 5.2 mmol/L 4.2 4.2 3.9  Chloride 96 - 106 mmol/L 100 100 100  CO2 18 - 29 mmol/L 25 24 25   Calcium 8.7 - 10.3 mg/dL 9.7 9.4 9.5  Total Protein 6.0 - 8.5 g/dL 7.4 7.3 7.3  Total Bilirubin 0.0 - 1.2 mg/dL 0.6 0.9 0.5  Alkaline Phos 39 - 117 IU/L 88 227(H) 93  AST 0 - 40 IU/L 10 169(H) 14  ALT 0 - 32 IU/L 11 110(H) 14    Radiologic Studies:  Ultrasound in 2015 shows 2 small filling defects that is thought to be either stones or polyps. Colonoscopy report from 2015 as noted above, available in Epic procedure section  Assessment: Encounter Diagnoses  Name Primary?  . Abdominal pain, chronic, epigastric Yes  . Other constipation   . Diverticulosis  of colon without hemorrhage   . LFTs abnormal     It is somewhat difficult to characterize this upper abdominal pain, especially since it does not seem to be bothering her anymore. She seems to feel that the acid suppression was helpful for the pain, but it is not clear if it truly was. Her chronic constipation seems related to diverticulosis, decreased mobility and possibly side effect of calcium channel blocker. The single incidence of elevated LFTs is also of unclear cause. It should be noted that it occurred at an office visit where she was seen for this upper abdominal pain. It therefore raises the suspicion for an acute hepatobiliary problems such as passing a common bile duct stone. Her more recent LFTs are normal.  Plan:  Gallbladder ultrasound Stop Zantac and Nexium for now because they are of uncertain benefit at this juncture. She can certainly resume the Zantac as needed of heartburn recurs.  Thank you for the courtesy of this consult.  Please call me with any questions or concerns.  Nelida Meuse III  CC: Merri Ray M.D.

## 2017-01-25 ENCOUNTER — Ambulatory Visit (HOSPITAL_COMMUNITY)
Admission: RE | Admit: 2017-01-25 | Discharge: 2017-01-25 | Disposition: A | Discharge status: Home / Self Care | Payer: Medicare Other | Source: Ambulatory Visit | Attending: Gastroenterology | Admitting: Gastroenterology

## 2017-01-25 DIAGNOSIS — R1013 Epigastric pain: Secondary | ICD-10-CM | POA: Diagnosis not present

## 2017-01-25 DIAGNOSIS — K5909 Other constipation: Secondary | ICD-10-CM | POA: Diagnosis not present

## 2017-01-25 DIAGNOSIS — G8929 Other chronic pain: Secondary | ICD-10-CM | POA: Insufficient documentation

## 2017-01-25 DIAGNOSIS — R7989 Other specified abnormal findings of blood chemistry: Secondary | ICD-10-CM | POA: Diagnosis not present

## 2017-01-25 DIAGNOSIS — K5730 Diverticulosis of large intestine without perforation or abscess without bleeding: Secondary | ICD-10-CM | POA: Insufficient documentation

## 2017-01-25 DIAGNOSIS — R945 Abnormal results of liver function studies: Secondary | ICD-10-CM

## 2017-01-25 DIAGNOSIS — K824 Cholesterolosis of gallbladder: Secondary | ICD-10-CM | POA: Diagnosis not present

## 2017-02-03 DIAGNOSIS — N2581 Secondary hyperparathyroidism of renal origin: Secondary | ICD-10-CM | POA: Diagnosis not present

## 2017-02-03 DIAGNOSIS — I10 Essential (primary) hypertension: Secondary | ICD-10-CM | POA: Diagnosis not present

## 2017-02-03 DIAGNOSIS — D631 Anemia in chronic kidney disease: Secondary | ICD-10-CM | POA: Diagnosis not present

## 2017-02-03 DIAGNOSIS — N189 Chronic kidney disease, unspecified: Secondary | ICD-10-CM | POA: Diagnosis not present

## 2017-02-03 DIAGNOSIS — N183 Chronic kidney disease, stage 3 (moderate): Secondary | ICD-10-CM | POA: Diagnosis not present

## 2017-03-08 ENCOUNTER — Encounter: Payer: Self-pay | Admitting: Family Medicine

## 2017-03-08 ENCOUNTER — Ambulatory Visit (INDEPENDENT_AMBULATORY_CARE_PROVIDER_SITE_OTHER): Payer: Medicare Other | Admitting: Family Medicine

## 2017-03-08 VITALS — BP 112/63 | HR 65 | Temp 98.6°F | Resp 17 | Ht 62.0 in | Wt 230.2 lb

## 2017-03-08 DIAGNOSIS — I10 Essential (primary) hypertension: Secondary | ICD-10-CM

## 2017-03-08 DIAGNOSIS — N289 Disorder of kidney and ureter, unspecified: Secondary | ICD-10-CM

## 2017-03-08 DIAGNOSIS — E039 Hypothyroidism, unspecified: Secondary | ICD-10-CM

## 2017-03-08 DIAGNOSIS — F411 Generalized anxiety disorder: Secondary | ICD-10-CM | POA: Diagnosis not present

## 2017-03-08 MED ORDER — VENLAFAXINE HCL 25 MG PO TABS: ORAL_TABLET | ORAL | 0 refills | Status: DC

## 2017-03-08 NOTE — Progress Notes (Signed)
Chief Complaint  Patient presents with  . Anxiety    follow up.  Per patient gittery all the time can hardly drive    HPI  Her last office visit was 12/15/2016 for anxiety Pt reports that she started Celexa for anxiety and felt jittery from day 1 She only took one dose since her appointment She reports that she has worsening anxiety She states that she feels more nervous and has more shakes Today while driving she got so nervous she wanted to pull over and stop driving  She also felt nauseous She previously tried lexapro, ativan, zoloft, klonopin, cymbalta She reports that if she is turning to the right she gets more nervous.  She gets a panic attack with driving and usually has to have someone drive her to her appts  Impaired renal function She only takes tylenol for pain She does not take ibuprofen or alleve She states that she is compliant with her bp medications  Essential Hypertension She reports that she has been doing better regarding her blood pressure She does not check it at home She denies headaches, chest pains or palpitations      Past Medical History:  Diagnosis Date  . Arthritis    knees, back  . Cancer [facility]) 2004   breast  . Depression   . GERD (gastroesophageal reflux disease)   . Glaucoma   . Heart palpitations   . High cholesterol   . History of transfusion    many years ago  . Hypertension   . Hypothyroidism   . Itching in the vaginal area   . Nocturia   . OSA (obstructive sleep apnea)    tried CPAP- but unsuccessful  . Renal disorder   . Thyroid disease    thyroid removed    Current Outpatient Prescriptions  Medication Sig Dispense Refill  . cetirizine (ZYRTEC) 10 MG tablet Take 1 tablet (10 mg total) by mouth daily. 30 tablet 11  . diltiazem (CARDIZEM CD) 120 MG 24 hr capsule Take 1 capsule (120 mg total) by mouth daily. 90 capsule 1  . esomeprazole (NEXIUM) 40 MG capsule take 1 capsule by mouth once daily 30 capsule 3  . fluticasone  (FLONASE) 50 MCG/ACT nasal spray Place 1 spray into both nostrils daily as needed for allergies.     Marland Kitchen latanoprost (XALATAN) 0.005 % ophthalmic solution     . levothyroxine (SYNTHROID, LEVOTHROID) 88 MCG tablet Take 1 tablet (88 mcg total) by mouth daily. 90 tablet 1  . polyethylene glycol powder (GLYCOLAX/MIRALAX) powder Take 17 g by mouth 2 (two) times daily as needed. 289 g 1  . rosuvastatin (CRESTOR) 10 MG tablet Take 1 tablet (10 mg total) by mouth at bedtime. 90 tablet 1  . spironolactone (ALDACTONE) 50 MG tablet Take 1 tablet (50 mg total) by mouth daily. 90 tablet 1  . venlafaxine (EFFEXOR) 25 MG tablet Take one tablet by mouth with dinner for one week then increase to one tablet by mouth twice a day with meals. 30 tablet 0   No current facility-administered medications for this visit.     Allergies:  Allergies  Allergen Reactions  . Codeine Other (See Comments)    confusion  . Lipitor [Atorvastatin Calcium] Itching    Past Surgical History:  Procedure Laterality Date  . ABDOMINAL HYSTERECTOMY  83   BSO  . BREAST SURGERY  2004   left breast removed due to cancer no chemo or radiation  . MASTECTOMY Left   . THYROIDECTOMY  due to knot  . TOTAL KNEE ARTHROPLASTY Left 07/28/2015   Procedure: LEFT TOTAL KNEE ARTHROPLASTY;  Surgeon: Alyssa Arabian, MD;  Location: [facility];  Service: Orthopedics;  Laterality: Left;    Social History   Social History  . Marital status: Widowed    Spouse name: N/A  . Number of children: N/A  . Years of education: N/A   Occupational History  . retired Retired   Social History Main Topics  . Smoking status: Former Smoker    Years: 20.00    Types: Cigarettes    Quit date: 08/16/1978  . Smokeless tobacco: Never Used     Comment: 1 pack per week--10/16/12  . Alcohol use No  . Drug use: No  . Sexual activity: No   Other Topics Concern  . None   Social History Narrative  . None    ROS See hpi  Objective: Vitals:   03/08/17 1344  03/08/17 1350  BP: (!) 145/73 112/63  Pulse: 68 65  Resp: 17   Temp: 98.6 F (37 C)   TempSrc: Oral   SpO2: 100%   Weight: 230 lb 3.2 oz (104.4 kg)   Height: 5\' 2"  (1.575 m)     Physical Exam  Constitutional: She is oriented to person, place, and time. She appears well-developed and well-nourished.  HENT:  Head: Normocephalic and atraumatic.  Eyes: Conjunctivae and EOM are normal.  Neck: Normal range of motion.  Cardiovascular: Normal rate, regular rhythm and normal heart sounds.   Pulmonary/Chest: Effort normal and breath sounds normal. No respiratory distress. She has no wheezes.  Lymphadenopathy:    She has no cervical adenopathy.  Neurological: She is alert and oriented to person, place, and time.  Psychiatric: She has a normal mood and affect. Her behavior is normal. Judgment and thought content normal.     Assessment and Plan Alyssa Jefferson was seen today for anxiety.  Diagnoses and all orders for this visit:  Generalized anxiety disorder- reviewed meds tried in the past and discussed effexor Advised to make appts for Saturday mornings Discussed she should slowly increase her dose in a stepwise fashion  Essential hypertension- bp improved  Impaired renal function- will reassess today as her trend has been increasing in the past 5 months -     Basic metabolic panel  Acquired hypothyroidism- will check levels today Goal is between 1 and 4 -     TSH  Other orders -     venlafaxine (EFFEXOR) 25 MG tablet; Take one tablet by mouth with dinner for one week then increase to one tablet by mouth twice a day with meals.   A total of 40 minutes were spent face-to-face with the patient during this encounter and over half of that time was spent on counseling and coordination of care.   [place]

## 2017-03-08 NOTE — Patient Instructions (Addendum)
  Follow up on a Saturday in one month   IF you received an x-ray today, you will receive an invoice from [facility]. Please contact [facility] at [phone] with questions or concerns regarding your invoice.   IF you received labwork today, you will receive an invoice from [place]. Please contact [facility] at [phone] with questions or concerns regarding your invoice.   Our billing staff will not be able to assist you with questions regarding bills from these companies.  You will be contacted with the lab results as soon as they are available. The fastest way to get your results is to activate your My Chart account. Instructions are located on the last page of this paperwork. If you have not heard from [place] regarding the results in 2 weeks, please contact this office.

## 2017-03-09 ENCOUNTER — Other Ambulatory Visit: Payer: Self-pay | Admitting: Family Medicine

## 2017-03-09 LAB — BASIC METABOLIC PANEL
BUN/Creatinine Ratio: 17 (ref 12–28)
BUN: 26 mg/dL (ref 8–27)
CO2: 21 mmol/L (ref 20–29)
Calcium: 9.6 mg/dL (ref 8.7–10.3)
Chloride: 103 mmol/L (ref 96–106)
Creatinine, Ser: 1.49 mg/dL — ABNORMAL HIGH (ref 0.57–1.00)
GFR calc Af Amer: 41 mL/min/{1.73_m2} — ABNORMAL LOW (ref >59)
GFR calc non Af Amer: 35 mL/min/{1.73_m2} — ABNORMAL LOW (ref >59)
Glucose: 100 mg/dL — ABNORMAL HIGH (ref 65–99)
Potassium: 4.9 mmol/L (ref 3.5–5.2)
Sodium: 142 mmol/L (ref 134–144)

## 2017-03-09 LAB — TSH: TSH: 0.083 u[IU]/mL — ABNORMAL LOW (ref 0.450–4.500)

## 2017-03-09 MED ORDER — LEVOTHYROXINE SODIUM 50 MCG PO TABS
50.0000 ug | ORAL_TABLET | Freq: Every day | ORAL | 0 refills | Status: DC
Start: 2017-03-09 — End: 2017-06-10

## 2017-04-05 ENCOUNTER — Encounter: Payer: Self-pay | Admitting: Family Medicine

## 2017-04-05 ENCOUNTER — Ambulatory Visit (INDEPENDENT_AMBULATORY_CARE_PROVIDER_SITE_OTHER): Payer: Medicare Other | Admitting: Family Medicine

## 2017-04-05 VITALS — BP 143/70 | HR 70 | Temp 97.8°F | Resp 16 | Ht 62.0 in | Wt 233.6 lb

## 2017-04-05 DIAGNOSIS — F411 Generalized anxiety disorder: Secondary | ICD-10-CM | POA: Diagnosis not present

## 2017-04-05 DIAGNOSIS — E0580 Other thyrotoxicosis without thyrotoxic crisis or storm: Secondary | ICD-10-CM

## 2017-04-05 MED ORDER — VENLAFAXINE HCL 25 MG PO TABS: 25.0000 mg | ORAL_TABLET | Freq: Two times a day (BID) | ORAL | 3 refills | Status: DC

## 2017-04-05 NOTE — Patient Instructions (Addendum)
   IF you received an x-ray today, you will receive an invoice from [facility]. Please contact [facility] at [phone] with questions or concerns regarding your invoice.   IF you received labwork today, you will receive an invoice from [facility]. Please contact [facility] at [phone] with questions or concerns regarding your invoice.   Our billing staff will not be able to assist you with questions regarding bills from these companies.  You will be contacted with the lab results as soon as they are available. The fastest way to get your results is to activate your My Chart account. Instructions are located on the last page of this paperwork. If you have not heard from us regarding the results in 2 weeks, please contact this office.     Living With Anxiety After being diagnosed with an anxiety disorder, you may be relieved to know why you have felt or behaved a certain way. It is natural to also feel overwhelmed about the treatment ahead and what it will mean for your life. With care and support, you can manage this condition and recover from it. How to cope with anxiety Dealing with stress Stress is your body's reaction to life changes and events, both good and bad. Stress can last just a few hours or it can be ongoing. Stress can play a major role in anxiety, so it is important to learn both how to cope with stress and how to think about it differently. Talk with your health care provider or a counselor to learn more about stress reduction. He or she may suggest some stress reduction techniques, such as:  Music therapy. This can include creating or listening to music that you enjoy and that inspires you.  Mindfulness-based meditation. This involves being aware of your normal breaths, rather than trying to control your breathing. It can be done while sitting or walking.  Centering prayer. This is a kind of meditation that involves focusing on a word, phrase, or  sacred image that is meaningful to you and that brings you peace.  Deep breathing. To do this, expand your stomach and inhale slowly through your nose. Hold your breath for 3-5 seconds. Then exhale slowly, allowing your stomach muscles to relax.  Self-talk. This is a skill where you identify thought patterns that lead to anxiety reactions and correct those thoughts.  Muscle relaxation. This involves tensing muscles then relaxing them.  Choose a stress reduction technique that fits your lifestyle and personality. Stress reduction techniques take time and practice. Set aside 5-15 minutes a day to do them. Therapists can offer training in these techniques. The training may be covered by some insurance plans. Other things you can do to manage stress include:  Keeping a stress diary. This can help you learn what triggers your stress and ways to control your response.  Thinking about how you respond to certain situations. You may not be able to control everything, but you can control your reaction.  Making time for activities that help you relax, and not feeling guilty about spending your time in this way.  Therapy combined with coping and stress-reduction skills provides the best chance for successful treatment. Medicines Medicines can help ease symptoms. Medicines for anxiety include:  Anti-anxiety drugs.  Antidepressants.  Beta-blockers.  Medicines may be used as the main treatment for anxiety disorder, along with therapy, or if other treatments are not working. Medicines should be prescribed by a health care provider. Relationships Relationships can play a big part in   helping you recover. Try to spend more time connecting with trusted friends and family members. Consider going to couples counseling, taking family education classes, or going to family therapy. Therapy can help you and others better understand the condition. How to recognize changes in your condition Everyone has a  different response to treatment for anxiety. Recovery from anxiety happens when symptoms decrease and stop interfering with your daily activities at home or work. This may mean that you will start to:  Have better concentration and focus.  Sleep better.  Be less irritable.  Have more energy.  Have improved memory.  It is important to recognize when your condition is getting worse. Contact your health care provider if your symptoms interfere with home or work and you do not feel like your condition is improving. Where to find help and support: You can get help and support from these sources:  Self-help groups.  Online and community organizations.  A trusted spiritual leader.  Couples counseling.  Family education classes.  Family therapy.  Follow these instructions at home:  Eat a healthy diet that includes plenty of vegetables, fruits, whole grains, low-fat dairy products, and lean protein. Do not eat a lot of foods that are high in solid fats, added sugars, or salt.  Exercise. Most adults should do the following: ? Exercise for at least 150 minutes each week. The exercise should increase your heart rate and make you sweat (moderate-intensity exercise). ? Strengthening exercises at least twice a week.  Cut down on caffeine, tobacco, alcohol, and other potentially harmful substances.  Get the right amount and quality of sleep. Most adults need 7-9 hours of sleep each night.  Make choices that simplify your life.  Take over-the-counter and prescription medicines only as told by your health care provider.  Avoid caffeine, alcohol, and certain over-the-counter cold medicines. These may make you feel worse. Ask your pharmacist which medicines to avoid.  Keep all follow-up visits as told by your health care provider. This is important. Questions to ask your health care provider  Would I benefit from therapy?  How often should I follow up with a health care  provider?  How long do I need to take medicine?  Are there any long-term side effects of my medicine?  Are there any alternatives to taking medicine? Contact a health care provider if:  You have a hard time staying focused or finishing daily tasks.  You spend many hours a day feeling worried about everyday life.  You become exhausted by worry.  You start to have headaches, feel tense, or have nausea.  You urinate more than normal.  You have diarrhea. Get help right away if:  You have a racing heart and shortness of breath.  You have thoughts of hurting yourself or others. If you ever feel like you may hurt yourself or others, or have thoughts about taking your own life, get help right away. You can go to your nearest emergency department or call:  Your local emergency services (911 in the [place]  A suicide crisis helpline, such as the National Suicide Prevention Lifeline at 1-800-273-8255. This is open 24-hours a day.  Summary  Taking steps to deal with stress can help calm you.  Medicines cannot cure anxiety disorders, but they can help ease symptoms.  Family, friends, and partners can play a big part in helping you recover from an anxiety disorder. This information is not intended to replace advice given to you by your health care provider. Make   sure you discuss any questions you have with your health care provider. Document Released: 07/27/2016 Document Revised: 07/27/2016 Document Reviewed: 07/27/2016 Elsevier Interactive Patient Education  2018 Elsevier Inc.  

## 2017-04-05 NOTE — Progress Notes (Signed)
Chief Complaint  Patient presents with  . new medication     follow up on venlafaxine hcl, pt reports doing pretty good on new medication    HPI  Generalized Anxiety Disorder Pt reports that she is doing well on the venlafaxine She takes venlafaxine 25mg  at 11am  She sleeps well at night but in the past couple of nights she has not slept well She states that she wakes up  She admits that she is stress eating ever when she is not hungry She tries to get out of the house daily and goes to church. She talks to friends She does not exercise May 30, 2023 is when he husband died so it is a stressful time for her but she will keep busy.  Morbid obesity She reports that she is not exercising lately She does not eat late night snacks She states that she has been eating more food Body mass index is 42.73 kg/m. Wt Readings from Last 3 Encounters:  04/05/17 233 lb 9.6 oz (106 kg)  03/08/17 230 lb 3.2 oz (104.4 kg)  01/19/17 229 lb (103.9 kg)   Iatrogenic Hyperthyroidism She is taking her thyroid medication daily She does not feel any difference since her dose was changed She denies palpitations or skin changes or bowel changes Her levothyroxine dose was decreased Lab Results  Component Value Date   TSH 0.083 (L) 03/08/2017    Past Medical History:  Diagnosis Date  . Arthritis    knees, back  . Cancer [facility]) 2004   breast  . Depression   . GERD (gastroesophageal reflux disease)   . Glaucoma   . Heart palpitations   . High cholesterol   . History of transfusion    many years ago  . Hypertension   . Hypothyroidism   . Itching in the vaginal area   . Nocturia   . OSA (obstructive sleep apnea)    tried CPAP- but unsuccessful  . Renal disorder   . Thyroid disease    thyroid removed    Current Outpatient Prescriptions  Medication Sig Dispense Refill  . cetirizine (ZYRTEC) 10 MG tablet Take 1 tablet (10 mg total) by mouth daily. 30 tablet 11  . diltiazem (CARDIZEM CD) 120  MG 24 hr capsule Take 1 capsule (120 mg total) by mouth daily. 90 capsule 1  . esomeprazole (NEXIUM) 40 MG capsule take 1 capsule by mouth once daily 30 capsule 3  . fluticasone (FLONASE) 50 MCG/ACT nasal spray Place 1 spray into both nostrils daily as needed for allergies.     Marland Kitchen latanoprost (XALATAN) 0.005 % ophthalmic solution     . levothyroxine (SYNTHROID, LEVOTHROID) 50 MCG tablet Take 1 tablet (50 mcg total) by mouth daily. 90 tablet 0  . rosuvastatin (CRESTOR) 10 MG tablet Take 1 tablet (10 mg total) by mouth at bedtime. 90 tablet 1  . spironolactone (ALDACTONE) 50 MG tablet Take 1 tablet (50 mg total) by mouth daily. 90 tablet 1  . venlafaxine (EFFEXOR) 25 MG tablet Take 1 tablet (25 mg total) by mouth 2 (two) times daily with a meal. 60 tablet 3  . polyethylene glycol powder (GLYCOLAX/MIRALAX) powder Take 17 g by mouth 2 (two) times daily as needed. (Patient not taking: Reported on 04/05/2017) 289 g 1   No current facility-administered medications for this visit.     Allergies:  Allergies  Allergen Reactions  . Codeine Other (See Comments)    confusion  . Lipitor [Atorvastatin Calcium] Itching    Past Surgical  History:  Procedure Laterality Date  . ABDOMINAL HYSTERECTOMY  83   BSO  . BREAST SURGERY  2004   left breast removed due to cancer no chemo or radiation  . MASTECTOMY Left   . THYROIDECTOMY     due to knot  . TOTAL KNEE ARTHROPLASTY Left 07/28/2015   Procedure: LEFT TOTAL KNEE ARTHROPLASTY;  Surgeon: Gaynelle Arabian, MD;  Location: [facility];  Service: Orthopedics;  Laterality: Left;    Social History   Social History  . Marital status: Widowed    Spouse name: N/A  . Number of children: N/A  . Years of education: N/A   Occupational History  . retired Retired   Social History Main Topics  . Smoking status: Former Smoker    Years: 20.00    Types: Cigarettes    Quit date: 08/16/1978  . Smokeless tobacco: Never Used     Comment: 1 pack per week--10/16/12  .  Alcohol use No  . Drug use: No  . Sexual activity: No   Other Topics Concern  . None   Social History Narrative  . None    ROS Review of Systems See HPI Constitution: No fevers or chills No malaise No diaphoresis Skin: No rash or itching Eyes: no blurry vision, no double vision GI: no diarrhea, constipation or dyspepsia GU: no dysuria or hematuria Neuro: no dizziness or headaches  Objective: Vitals:   04/05/17 0937  BP: (!) 143/70  Pulse: 70  Resp: 16  Temp: 97.8 F (36.6 C)  TempSrc: Oral  SpO2: 96%  Weight: 233 lb 9.6 oz (106 kg)  Height: 5\' 2"  (1.575 m)    Physical Exam  Constitutional: She is oriented to person, place, and time. She appears well-developed and well-nourished.  HENT:  Head: Normocephalic and atraumatic.  Eyes: Conjunctivae and EOM are normal.  Cardiovascular: Normal rate, regular rhythm and normal heart sounds.   No murmur heard. Pulmonary/Chest: Effort normal and breath sounds normal. No respiratory distress. She has no wheezes.  Neurological: She is alert and oriented to person, place, and time.  Psychiatric: She has a normal mood and affect. Her behavior is normal. Judgment and thought content normal.    Assessment and Plan Taisa was seen today for new medication.  Diagnoses and all orders for this visit:  Generalized anxiety disorder-  Advised pt to increase velanfaxine to bid dosing since that can help with the stress eating which is caused by anxiety  Iatrogenic hyperthyroidism- too early to check but discussed with the patient why the dose change was necessary She is asymptomatic  Morbid obesity ([place])- deteriorated due to stress eating Advised more exercise  Other orders -     venlafaxine (EFFEXOR) 25 MG tablet; Take 1 tablet (25 mg total) by mouth 2 (two) times daily with a meal.     Zoe A Stallings

## 2017-04-09 IMAGING — US US ABDOMEN LIMITED
1 series · 14 of 25 positions shown · non-contrast
Comparison: None.

CLINICAL DATA: Patient with 3 days of epigastric pain. Elevated
liver function test.

EXAM:
US ABDOMEN LIMITED - RIGHT UPPER QUADRANT

[Series 1: us abdomen limited · 0.20mm/px · 14 of 51 slices shown]
[im 1/51]
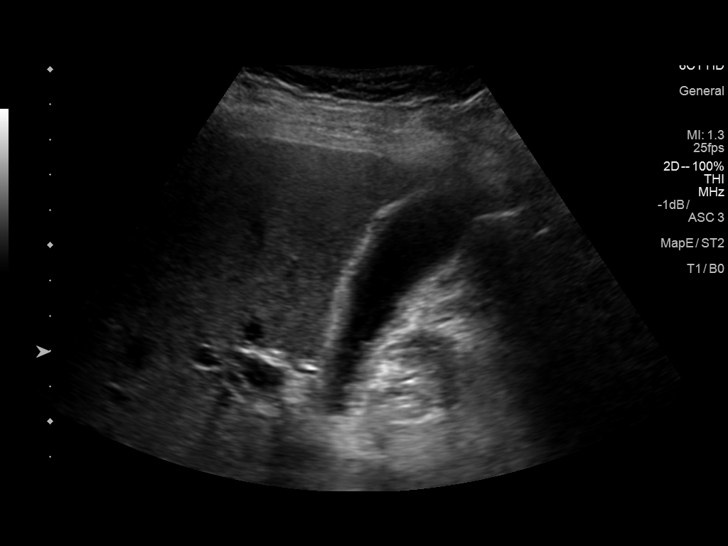
[im 5/51]
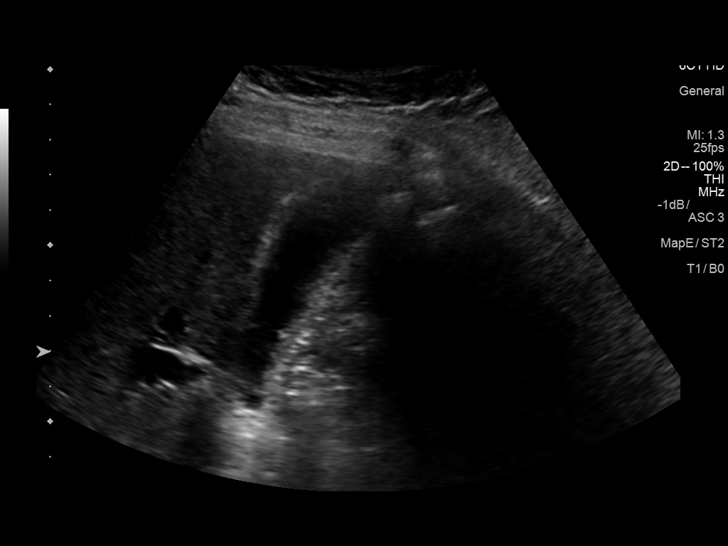
[im 9/51]
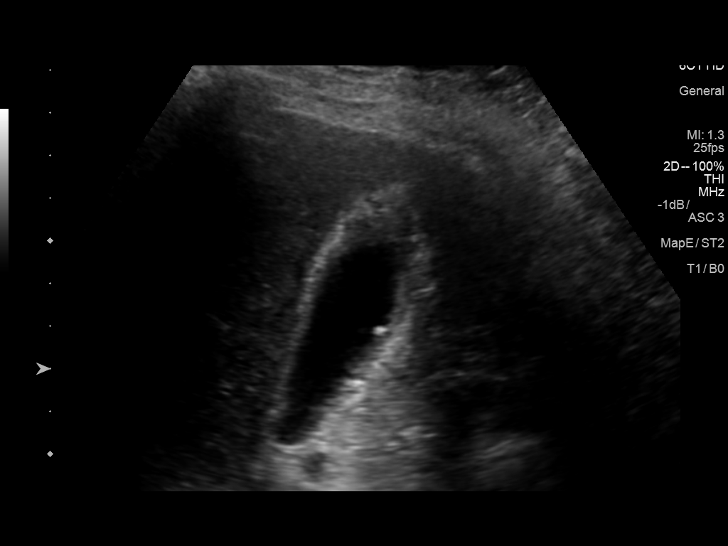
[im 13/51]
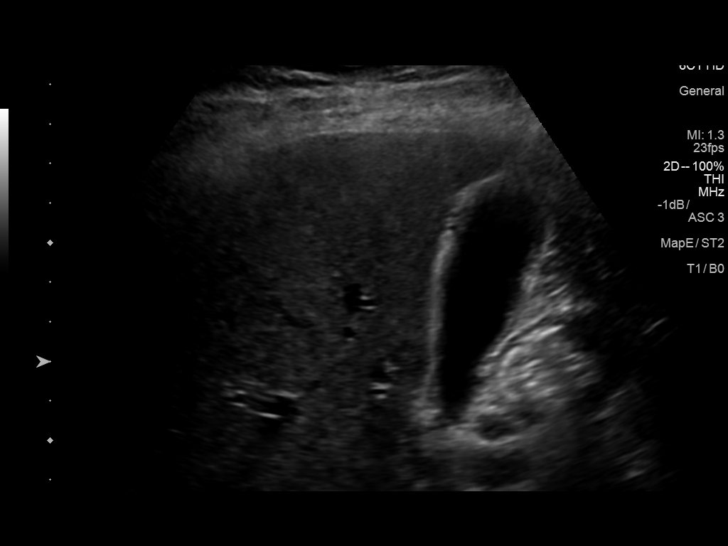
[im 17/51]
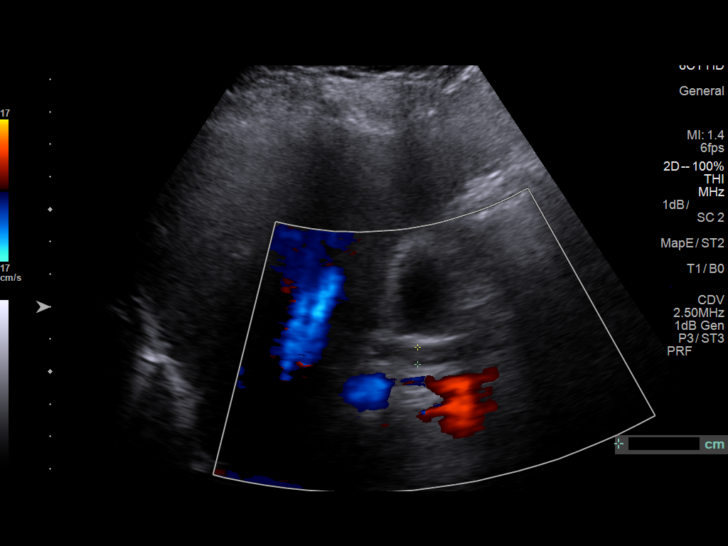
[im 19/51]
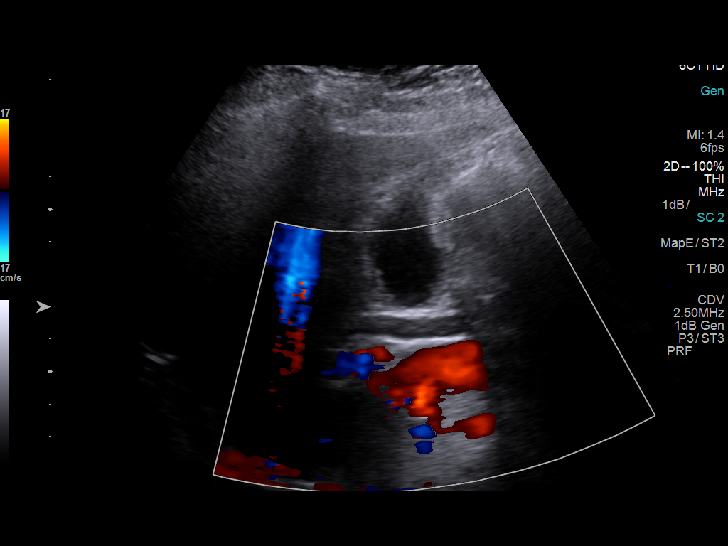
[im 23/51]
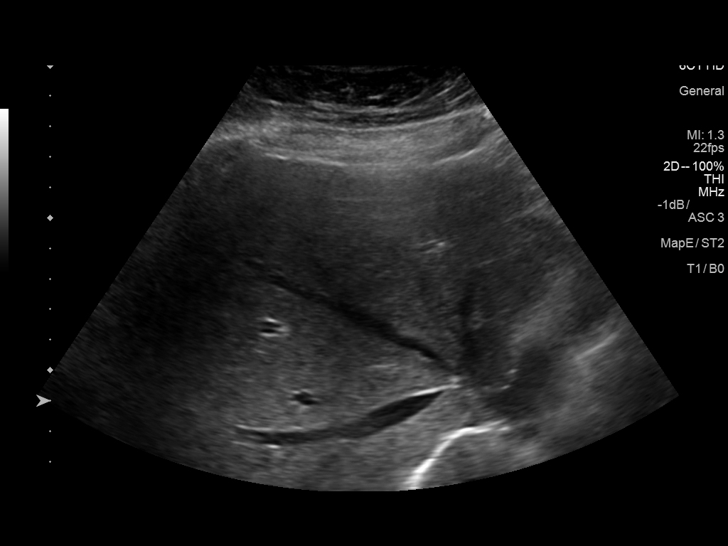
[im 28/51]
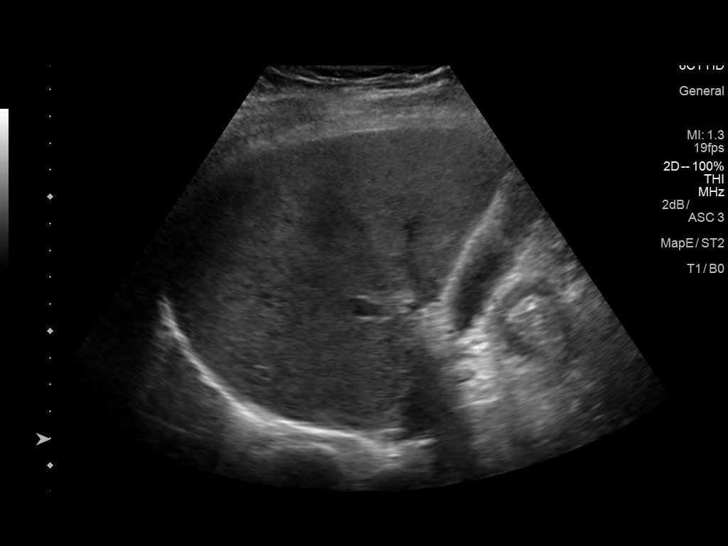
[im 32/51]
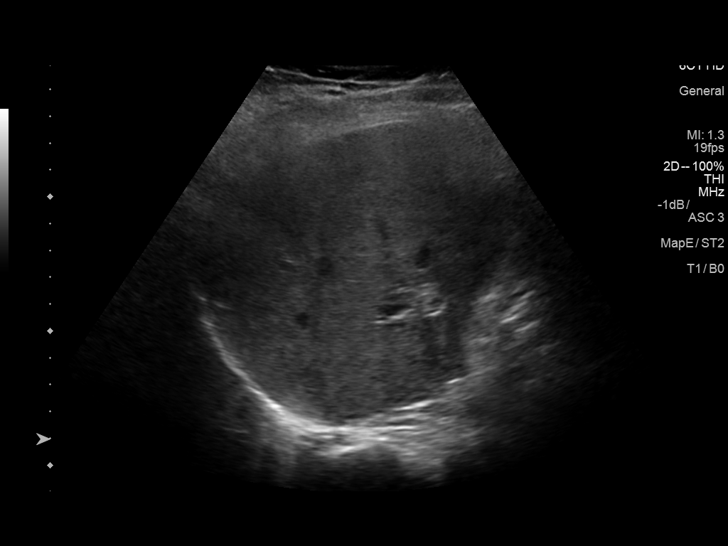
[im 34/51]
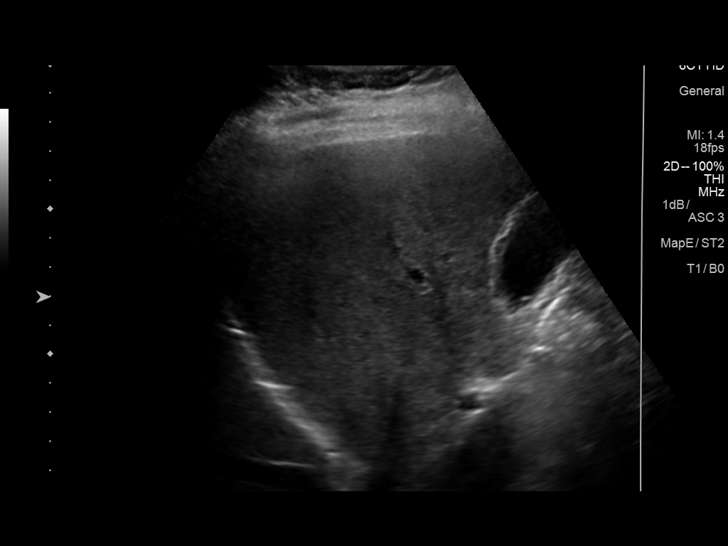
[im 38/51]
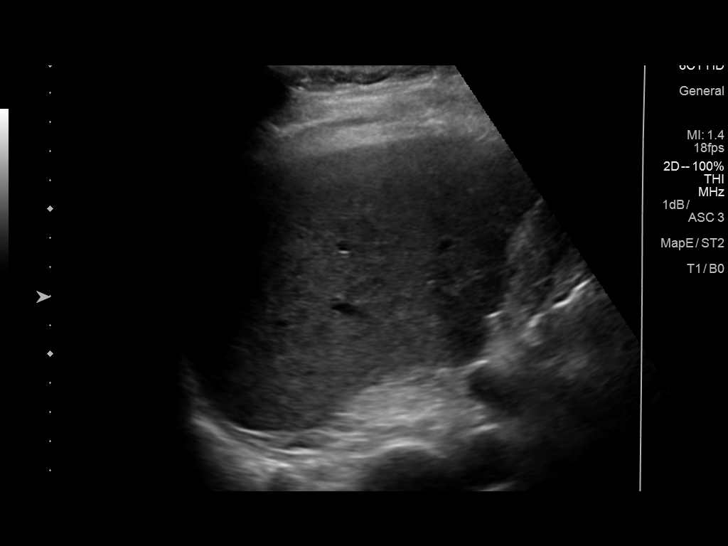
[im 42/51]
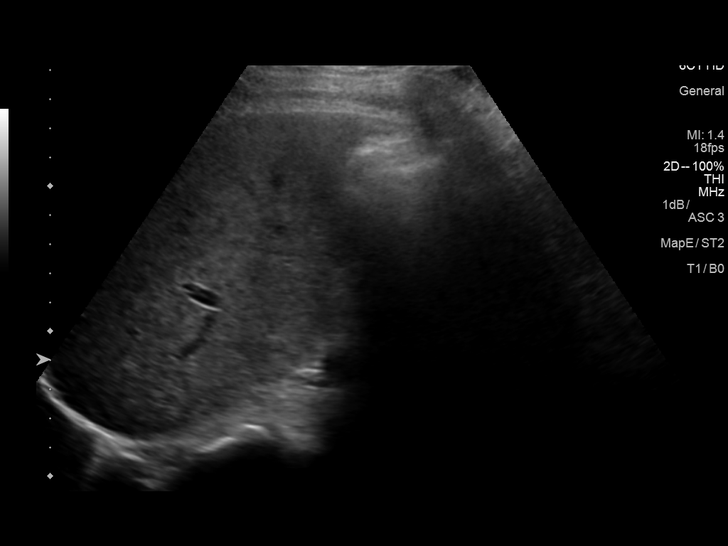
[im 46/51]
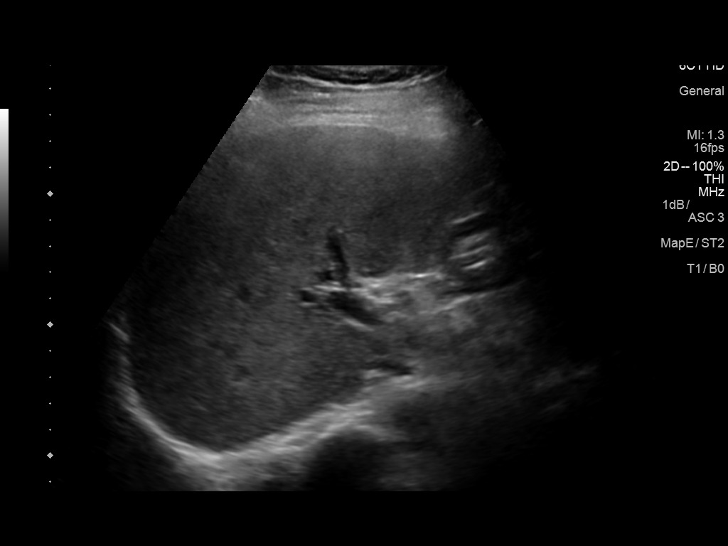
[im 51/51]
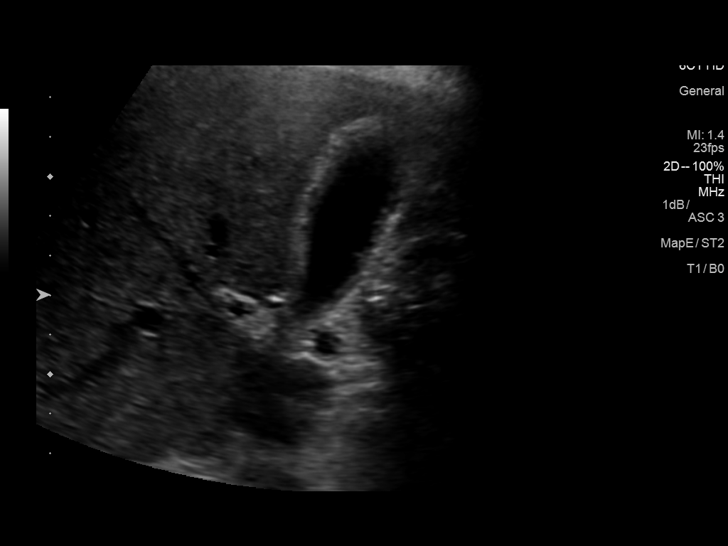

[14 of 25 positions shown; findings below may reference images not displayed]

FINDINGS: Gallbladder:

Gallbladder is mildly contracted. No gallbladder wall thickening or
pericholecystic fluid. Negative sonographic Murphy's sign. 3 mm
polyp.

Common bile duct:

Diameter: 6 mm

Liver:

No focal lesion identified. Within normal limits in parenchymal
echogenicity.
IMPRESSION: No cholelithiasis or sonographic evidence for acute cholecystitis.

## 2017-04-20 DIAGNOSIS — I129 Hypertensive chronic kidney disease with stage 1 through stage 4 chronic kidney disease, or unspecified chronic kidney disease: Secondary | ICD-10-CM | POA: Diagnosis not present

## 2017-04-20 DIAGNOSIS — E782 Mixed hyperlipidemia: Secondary | ICD-10-CM | POA: Diagnosis not present

## 2017-04-20 DIAGNOSIS — N183 Chronic kidney disease, stage 3 (moderate): Secondary | ICD-10-CM | POA: Diagnosis not present

## 2017-04-20 DIAGNOSIS — I10 Essential (primary) hypertension: Secondary | ICD-10-CM | POA: Diagnosis not present

## 2017-05-23 ENCOUNTER — Telehealth: Payer: Self-pay

## 2017-05-23 NOTE — Telephone Encounter (Signed)
Called pt to schedule Medicare Annual Wellness Visit with nurse health advisor. Patient expressed that she prefers to do this visit with Dr. Nolon Rod and will schedule when she is in the office for her next visit.    Alyssa Jefferson, B.A.  Care Guide [phone]

## 2017-06-06 ENCOUNTER — Ambulatory Visit: Payer: Medicare Other | Admitting: Family Medicine

## 2017-06-06 NOTE — Progress Notes (Deleted)
No chief complaint on file.   HPI  4 review of systems  Past Medical History:  Diagnosis Date  . Arthritis    knees, back  . Cancer [facility]) 2004   breast  . Depression   . GERD (gastroesophageal reflux disease)   . Glaucoma   . Heart palpitations   . High cholesterol   . History of transfusion    many years ago  . Hypertension   . Hypothyroidism   . Itching in the vaginal area   . Nocturia   . OSA (obstructive sleep apnea)    tried CPAP- but unsuccessful  . Renal disorder   . Thyroid disease    thyroid removed    Current Outpatient Prescriptions  Medication Sig Dispense Refill  . cetirizine (ZYRTEC) 10 MG tablet Take 1 tablet (10 mg total) by mouth daily. 30 tablet 11  . diltiazem (CARDIZEM CD) 120 MG 24 hr capsule Take 1 capsule (120 mg total) by mouth daily. 90 capsule 1  . esomeprazole (NEXIUM) 40 MG capsule take 1 capsule by mouth once daily 30 capsule 3  . fluticasone (FLONASE) 50 MCG/ACT nasal spray Place 1 spray into both nostrils daily as needed for allergies.     Marland Kitchen latanoprost (XALATAN) 0.005 % ophthalmic solution     . levothyroxine (SYNTHROID, LEVOTHROID) 50 MCG tablet Take 1 tablet (50 mcg total) by mouth daily. 90 tablet 0  . polyethylene glycol powder (GLYCOLAX/MIRALAX) powder Take 17 g by mouth 2 (two) times daily as needed. (Patient not taking: Reported on 04/05/2017) 289 g 1  . rosuvastatin (CRESTOR) 10 MG tablet Take 1 tablet (10 mg total) by mouth at bedtime. 90 tablet 1  . spironolactone (ALDACTONE) 50 MG tablet Take 1 tablet (50 mg total) by mouth daily. 90 tablet 1  . venlafaxine (EFFEXOR) 25 MG tablet Take 1 tablet (25 mg total) by mouth 2 (two) times daily with a meal. 60 tablet 3   No current facility-administered medications for this visit.     Allergies:  Allergies  Allergen Reactions  . Codeine Other (See Comments)    confusion  . Lipitor [Atorvastatin Calcium] Itching    Past Surgical History:  Procedure Laterality Date  . ABDOMINAL  HYSTERECTOMY  83   BSO  . BREAST SURGERY  2004   left breast removed due to cancer no chemo or radiation  . MASTECTOMY Left   . THYROIDECTOMY     due to knot  . TOTAL KNEE ARTHROPLASTY Left 07/28/2015   Procedure: LEFT TOTAL KNEE ARTHROPLASTY;  Surgeon: Gaynelle Arabian, MD;  Location: [facility];  Service: Orthopedics;  Laterality: Left;    Social History   Social History  . Marital status: Widowed    Spouse name: N/A  . Number of children: N/A  . Years of education: N/A   Occupational History  . retired Retired   Social History Main Topics  . Smoking status: Former Smoker    Years: 20.00    Types: Cigarettes    Quit date: 08/16/1978  . Smokeless tobacco: Never Used     Comment: 1 pack per week--10/16/12  . Alcohol use No  . Drug use: No  . Sexual activity: No   Other Topics Concern  . Not on file   Social History Narrative  . No narrative on file    Family History  Problem Relation Age of Onset  . Lung cancer Father   . Breast cancer Sister   . Ovarian cancer Sister   . Colon  cancer Neg Hx   . Esophageal cancer Neg Hx   . Stomach cancer Neg Hx   . Pancreatic cancer Neg Hx   . Liver disease Neg Hx      ROS Review of Systems See HPI Constitution: No fevers or chills No malaise No diaphoresis Skin: No rash or itching Eyes: no blurry vision, no double vision GU: no dysuria or hematuria Neuro: no dizziness or headaches * all others reviewed and negative   Objective: There were no vitals filed for this visit.  Physical Exam  Assessment and Plan There are no diagnoses linked to this encounter.   [name] P [employer]

## 2017-06-08 NOTE — Progress Notes (Signed)
Chief Complaint  Patient presents with  . recheck thyroid and anxiety  . Medication Refill    nexium, synthroid, crestor, and effexor    HPI   Hypothyroidism: Patient presents for evaluation of thyroid function. Symptoms consist of denies fatigue, weight changes, heat/cold intolerance, bowel/skin changes or CVS symptoms.  The problem has been unchanged.  Previous thyroid studies include TSH. Her levels in the past two years have been in the hyperthyroid ranges.  Lab Results  Component Value Date   TSH 0.083 (L) 03/08/2017   Back pain She reports that she was lifting her sister and transfering her from wheel chair to the bed and back 3 years ago  She continues to have intermittent back pain  She states that it is occurring 1-2 times a month If she bednds down she will feel the pain and it is so severe can cannot straighten up The pain is not radiating She gets pain on the right side that seems to stop at the buttocks She takes tylenol for the pain   Chart review Lumbar spine xray in chart in 2015 and 2016  Showed moderate degerenate disease of the L4-L5 and L5-S1   CKD stage 3 Essential hypertension  Patient here for follow-up of elevated blood pressure. She is not exercising and is adherent to low salt diet.  Blood pressure is well controlled at home. Cardiac symptoms none. Patient denies chest pain, chest pressure/discomfort, exertional chest pressure/discomfort, fatigue and lower extremity edema.  Cardiovascular risk factors: advanced age (older than 60 for men, 35 for women), hypertension, obesity (BMI >= 30 kg/m2) and sedentary lifestyle. Use of agents associated with hypertension: none. History of target organ damage: none. BP Readings from Last 3 Encounters:  06/09/17 140/70  04/05/17 (!) 143/70  03/08/17 112/63   Lab Results  Component Value Date   CREATININE 1.49 (H) 03/08/2017   Anxiety and Depression Feels better on the increase dose but would like to see more  improvement She denies any suicidal thoughts Feels like she is about 50% better with the medication increase Depression screen [facility] 2/9 06/09/2017 04/05/2017 03/08/2017 12/15/2016 11/16/2016  Decreased Interest 0 0 1 0 3  Down, Depressed, Hopeless 0 0 1 0 3  PHQ - 2 Score 0 0 2 0 6  Altered sleeping - - 0 - 3  Tired, decreased energy - - 3 - 3  Change in appetite - - 3 - 2  Feeling bad or failure about yourself  - - 1 - 1  Trouble concentrating - - 1 - 3  Moving slowly or fidgety/restless - - 1 - 1  Suicidal thoughts - - 0 - 0  PHQ-9 Score - - 11 - 19  Difficult doing work/chores - - Somewhat difficult - Very difficult       Past Medical History:  Diagnosis Date  . Arthritis    knees, back  . Cancer [facility]) 2004   breast  . Depression   . GERD (gastroesophageal reflux disease)   . Glaucoma   . Heart palpitations   . High cholesterol   . History of transfusion    many years ago  . Hypertension   . Hypothyroidism   . Itching in the vaginal area   . Nocturia   . OSA (obstructive sleep apnea)    tried CPAP- but unsuccessful  . Renal disorder   . Thyroid disease    thyroid removed    Current Outpatient Prescriptions  Medication Sig Dispense Refill  . cetirizine (ZYRTEC)  10 MG tablet Take 1 tablet (10 mg total) by mouth daily. 30 tablet 11  . diltiazem (CARDIZEM CD) 120 MG 24 hr capsule Take 1 capsule (120 mg total) by mouth daily. 90 capsule 1  . esomeprazole (NEXIUM) 40 MG capsule Take 1 capsule (40 mg total) by mouth daily. 90 capsule 3  . fluticasone (FLONASE) 50 MCG/ACT nasal spray Place 1 spray into both nostrils daily as needed for allergies.     Marland Kitchen latanoprost (XALATAN) 0.005 % ophthalmic solution     . levothyroxine (SYNTHROID, LEVOTHROID) 50 MCG tablet Take 1 tablet (50 mcg total) by mouth daily. 90 tablet 0  . rosuvastatin (CRESTOR) 10 MG tablet Take 1 tablet (10 mg total) by mouth at bedtime. 90 tablet 1  . spironolactone (ALDACTONE) 50 MG tablet Take 1 tablet (50 mg  total) by mouth daily. 90 tablet 1  . venlafaxine (EFFEXOR) 50 MG tablet Take 1 tablet (50 mg total) by mouth 2 (two) times daily with a meal. 180 tablet 0  . polyethylene glycol powder (GLYCOLAX/MIRALAX) powder Take 17 g by mouth 2 (two) times daily as needed. (Patient not taking: Reported on 06/09/2017) 289 g 1   No current facility-administered medications for this visit.     Allergies:  Allergies  Allergen Reactions  . Codeine Other (See Comments)    confusion  . Lipitor [Atorvastatin Calcium] Itching    Past Surgical History:  Procedure Laterality Date  . ABDOMINAL HYSTERECTOMY  83   BSO  . BREAST SURGERY  2004   left breast removed due to cancer no chemo or radiation  . MASTECTOMY Left   . THYROIDECTOMY     due to knot  . TOTAL KNEE ARTHROPLASTY Left 07/28/2015   Procedure: LEFT TOTAL KNEE ARTHROPLASTY;  Surgeon: Gaynelle Arabian, MD;  Location: [facility];  Service: Orthopedics;  Laterality: Left;    Social History   Social History  . Marital status: Widowed    Spouse name: N/A  . Number of children: N/A  . Years of education: N/A   Occupational History  . retired Retired   Social History Main Topics  . Smoking status: Former Smoker    Years: 20.00    Types: Cigarettes    Quit date: 08/16/1978  . Smokeless tobacco: Never Used     Comment: 1 pack per week--10/16/12  . Alcohol use No  . Drug use: No  . Sexual activity: No   Other Topics Concern  . None   Social History Narrative  . None    Family History  Problem Relation Age of Onset  . Lung cancer Father   . Breast cancer Sister   . Ovarian cancer Sister   . Colon cancer Neg Hx   . Esophageal cancer Neg Hx   . Stomach cancer Neg Hx   . Pancreatic cancer Neg Hx   . Liver disease Neg Hx      ROS Review of Systems See HPI Constitution: No fevers or chills No malaise No diaphoresis Skin: No rash or itching Eyes: no blurry vision, no double vision GU: no dysuria or hematuria Neuro: no dizziness or  headaches  Objective: Vitals:   06/09/17 0918  BP: 140/70  Pulse: 69  Resp: 18  Temp: 98.7 F (37.1 C)  TempSrc: Oral  SpO2: 97%  Weight: 240 lb 6.4 oz (109 kg)  Height: 5\' 2"  (1.575 m)    Physical Exam  Constitutional: She is oriented to person, place, and time. She appears well-developed and well-nourished.  Neck: Normal range of motion. Neck supple. No thyromegaly present.  Cardiovascular: Normal rate, regular rhythm and normal heart sounds.   Musculoskeletal: Normal range of motion. She exhibits no deformity.  Only noted abnormality on exam of the lumbar region is tenderness over the right SI joint.  Neurological: She is alert and oriented to person, place, and time.  Skin: Skin is warm. Capillary refill takes less than 2 seconds.  Psychiatric: She has a normal mood and affect. Her behavior is normal. Judgment and thought content normal.    Assessment and Plan Lattie was seen today for recheck thyroid and anxiety and medication refill.  Diagnoses and all orders for this visit:  Encounter for medication monitoring -     Basic metabolic panel  Generalized anxiety disorder Moderate recurrent major depression ([place]) -  increased venlafaxine to 50mg  bid  Iatrogenic hyperthyroidism- will check levels and adjust medications -     TSH   CKD (chronic kidney disease) stage 3, GFR 30-59 ml/min (HCC)- continue with nephrology Avoid nsaids Control bp  Will follow up  Arthritis of lumbar spine- discussed topical voltaren, stretches and ice  Pt declined flu vaccination -     Cancel: Flu Vaccine QUAD 36+ mos IM  Other orders -     esomeprazole (NEXIUM) 40 MG capsule; Take 1 capsule (40 mg total) by mouth daily. -     rosuvastatin (CRESTOR) 10 MG tablet; Take 1 tablet (10 mg total) by mouth at bedtime. -     venlafaxine (EFFEXOR) 50 MG tablet; Take 1 tablet (50 mg total) by mouth 2 (two) times daily with a meal.     Alyssa Jefferson

## 2017-06-09 ENCOUNTER — Ambulatory Visit (INDEPENDENT_AMBULATORY_CARE_PROVIDER_SITE_OTHER): Payer: Medicare Other | Admitting: Family Medicine

## 2017-06-09 ENCOUNTER — Encounter: Payer: Self-pay | Admitting: Family Medicine

## 2017-06-09 VITALS — BP 140/70 | HR 69 | Temp 98.7°F | Resp 18 | Ht 62.0 in | Wt 240.4 lb

## 2017-06-09 DIAGNOSIS — Z5181 Encounter for therapeutic drug level monitoring: Secondary | ICD-10-CM | POA: Diagnosis not present

## 2017-06-09 DIAGNOSIS — N183 Chronic kidney disease, stage 3 unspecified: Secondary | ICD-10-CM

## 2017-06-09 DIAGNOSIS — F331 Major depressive disorder, recurrent, moderate: Secondary | ICD-10-CM

## 2017-06-09 DIAGNOSIS — F411 Generalized anxiety disorder: Secondary | ICD-10-CM

## 2017-06-09 DIAGNOSIS — E0580 Other thyrotoxicosis without thyrotoxic crisis or storm: Secondary | ICD-10-CM

## 2017-06-09 DIAGNOSIS — M47816 Spondylosis without myelopathy or radiculopathy, lumbar region: Secondary | ICD-10-CM

## 2017-06-09 MED ORDER — ROSUVASTATIN CALCIUM 10 MG PO TABS: 10.0000 mg | ORAL_TABLET | Freq: Every day | ORAL | 1 refills | Status: DC

## 2017-06-09 MED ORDER — VENLAFAXINE HCL 50 MG PO TABS: 50.0000 mg | ORAL_TABLET | Freq: Two times a day (BID) | ORAL | 0 refills | Status: DC

## 2017-06-09 MED ORDER — ESOMEPRAZOLE MAGNESIUM 40 MG PO CPDR: 40.0000 mg | DELAYED_RELEASE_CAPSULE | Freq: Every day | ORAL | 3 refills | Status: DC

## 2017-06-09 NOTE — Patient Instructions (Addendum)
   IF you received an x-ray today, you will receive an invoice from [facility]. Please contact  Radiology at [phone] with questions or concerns regarding your invoice.   IF you received labwork today, you will receive an invoice from [facility]. Please contact [facility] at [phone] with questions or concerns regarding your invoice.   Our billing staff will not be able to assist you with questions regarding bills from these companies.  You will be contacted with the lab results as soon as they are available. The fastest way to get your results is to activate your My Chart account. Instructions are located on the last page of this paperwork. If you have not heard from us regarding the results in 2 weeks, please contact this office.    Degenerative Disk Disease Degenerative disk disease is a condition caused by the changes that occur in spinal disks as you grow older. Spinal disks are soft and compressible disks located between the bones of your spine (vertebrae). These disks act like shock absorbers. Degenerative disk disease can affect the whole spine. However, the neck and lower back are most commonly affected. Many changes can occur in the spinal disks with aging, such as:  The spinal disks may dry and shrink.  Small tears may occur in the tough, outer covering of the disk (annulus).  The disk space may become smaller due to loss of water.  Abnormal growths in the bone (spurs) may occur. This can put pressure on the nerve roots exiting the spinal canal, causing pain.  The spinal canal may become narrowed.  What increases the risk?  Being overweight.  Having a family history of degenerative disk disease.  Smoking.  There is increased risk if you are doing heavy lifting or have a sudden injury. What are the signs or symptoms? Symptoms vary from person to person and may include:  Pain that varies in intensity. Some people have no pain, while others have  severe pain. The location of the pain depends on the part of your backbone that is affected. ? You will have neck or arm pain if a disk in the neck area is affected. ? You will have pain in your back, buttocks, or legs if a disk in the lower back is affected.  Pain that becomes worse while bending, reaching up, or with twisting movements.  Pain that may start gradually and then get worse as time passes. It may also start after a major or minor injury.  Numbness or tingling in the arms or legs.  How is this diagnosed? Your health care provider will ask you about your symptoms and about activities or habits that may cause the pain. He or she may also ask about any injuries, diseases, or treatments you have had. Your health care provider will examine you to check for the range of movement that is possible in the affected area, to check for strength in your extremities, and to check for sensation in the areas of the arms and legs supplied by different nerve roots. You may also have:  An X-ray of the spine.  Other imaging tests, such as MRI.  How is this treated? Your health care provider will advise you on the best plan for treatment. Treatment may include:  Medicines.  Rehabilitation exercises.  Follow these instructions at home:  Follow proper lifting and walking techniques as advised by your health care provider.  Maintain good posture.  Exercise regularly as advised by your health care provider.  Perform   relaxation exercises.  Change your sitting, standing, and sleeping habits as advised by your health care provider.  Change positions frequently.  Lose weight or maintain a healthy weight as advised by your health care provider.  Do not use any tobacco products, including cigarettes, chewing tobacco, or electronic cigarettes. If you need help quitting, ask your health care provider.  Wear supportive footwear.  Take medicines only as directed by your health care  provider. Contact a health care provider if:  Your pain does not go away within 1-4 weeks.  You have significant appetite or weight loss. Get help right away if:  Your pain is severe.  You notice weakness in your arms, hands, or legs.  You begin to lose control of your bladder or bowel movements.  You have fevers or night sweats. This information is not intended to replace advice given to you by your health care provider. Make sure you discuss any questions you have with your health care provider. Document Released: 05/30/2007 Document Revised: 01/08/2016 Document Reviewed: 12/04/2013 Elsevier Interactive Patient Education  2018 [employer]vier Inc.  

## 2017-06-10 LAB — BASIC METABOLIC PANEL
BUN/Creatinine Ratio: 14 (ref 12–28)
BUN: 18 mg/dL (ref 8–27)
CO2: 23 mmol/L (ref 20–29)
Calcium: 9 mg/dL (ref 8.7–10.3)
Chloride: 101 mmol/L (ref 96–106)
Creatinine, Ser: 1.32 mg/dL — ABNORMAL HIGH (ref 0.57–1.00)
GFR calc Af Amer: 47 mL/min/{1.73_m2} — ABNORMAL LOW (ref >59)
GFR calc non Af Amer: 41 mL/min/{1.73_m2} — ABNORMAL LOW (ref >59)
Glucose: 89 mg/dL (ref 65–99)
Potassium: 4.3 mmol/L (ref 3.5–5.2)
Sodium: 140 mmol/L (ref 134–144)

## 2017-06-10 LAB — TSH: TSH: 1.37 u[IU]/mL (ref 0.450–4.500)

## 2017-06-10 MED ORDER — LEVOTHYROXINE SODIUM 50 MCG PO TABS: 50.0000 ug | ORAL_TABLET | Freq: Every day | ORAL | 1 refills | Status: DC

## 2017-06-10 NOTE — Addendum Note (Signed)
Addended by: Delia Chimes A on: 06/10/2017 09:26 AM   Modules accepted: Orders

## 2017-06-22 ENCOUNTER — Other Ambulatory Visit: Payer: Self-pay | Admitting: Family Medicine

## 2017-07-06 ENCOUNTER — Other Ambulatory Visit: Payer: Self-pay

## 2017-07-06 ENCOUNTER — Ambulatory Visit (INDEPENDENT_AMBULATORY_CARE_PROVIDER_SITE_OTHER): Payer: Medicare Other | Admitting: Family Medicine

## 2017-07-06 ENCOUNTER — Encounter: Payer: Self-pay | Admitting: Family Medicine

## 2017-07-06 VITALS — BP 138/88 | HR 77 | Temp 99.3°F | Resp 17 | Ht 62.0 in | Wt 237.8 lb

## 2017-07-06 DIAGNOSIS — M25572 Pain in left ankle and joints of left foot: Secondary | ICD-10-CM | POA: Diagnosis not present

## 2017-07-06 DIAGNOSIS — S96912A Strain of unspecified muscle and tendon at ankle and foot level, left foot, initial encounter: Secondary | ICD-10-CM | POA: Diagnosis not present

## 2017-07-06 NOTE — Progress Notes (Signed)
Chief Complaint  Patient presents with  . left ankle sprain    x 1 month     HPI  Pt had left ankle injury when she twisted her ankle when she stopped in a small trench She states that she has some soreness She reports that she has not iced it or wrapped it The pain is still present She denies any swelling or toe numbness She is not limping but it hurts  4 review of systems  Past Medical History:  Diagnosis Date  . Arthritis    knees, back  . Cancer [facility]) 2004   breast  . Depression   . GERD (gastroesophageal reflux disease)   . Glaucoma   . Heart palpitations   . High cholesterol   . History of transfusion    many years ago  . Hypertension   . Hypothyroidism   . Itching in the vaginal area   . Nocturia   . OSA (obstructive sleep apnea)    tried CPAP- but unsuccessful  . Renal disorder   . Thyroid disease    thyroid removed    Current Outpatient Medications  Medication Sig Dispense Refill  . cetirizine (ZYRTEC) 10 MG tablet Take 1 tablet (10 mg total) by mouth daily. 30 tablet 11  . diltiazem (CARDIZEM CD) 120 MG 24 hr capsule Take 1 capsule (120 mg total) by mouth daily. 90 capsule 1  . esomeprazole (NEXIUM) 40 MG capsule Take 1 capsule (40 mg total) by mouth daily. 90 capsule 3  . fluticasone (FLONASE) 50 MCG/ACT nasal spray Place 1 spray into both nostrils daily as needed for allergies.     Marland Kitchen latanoprost (XALATAN) 0.005 % ophthalmic solution     . levothyroxine (SYNTHROID, LEVOTHROID) 50 MCG tablet Take 1 tablet (50 mcg total) by mouth daily. 90 tablet 1  . polyethylene glycol powder (GLYCOLAX/MIRALAX) powder Take 17 g by mouth 2 (two) times daily as needed. 289 g 1  . rosuvastatin (CRESTOR) 10 MG tablet Take 1 tablet (10 mg total) by mouth at bedtime. 90 tablet 1  . spironolactone (ALDACTONE) 50 MG tablet take 1 tablet by mouth once daily 90 tablet 1  . venlafaxine (EFFEXOR) 50 MG tablet Take 1 tablet (50 mg total) by mouth 2 (two) times daily with a meal.  180 tablet 0   No current facility-administered medications for this visit.     Allergies:  Allergies  Allergen Reactions  . Codeine Other (See Comments)    confusion  . Lipitor [Atorvastatin Calcium] Itching    Past Surgical History:  Procedure Laterality Date  . ABDOMINAL HYSTERECTOMY  83   BSO  . BREAST SURGERY  2004   left breast removed due to cancer no chemo or radiation  . MASTECTOMY Left   . THYROIDECTOMY     due to knot  . 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 Marital status: Widowed    Spouse name: None  . Number of children: None  . Years of education: None  . Highest education level: None  Social Needs  . Financial resource strain: None  . Food insecurity - worry: None  . Food insecurity - inability: None  . Transportation needs - medical: None  . Transportation needs - non-medical: None  Occupational History  . Occupation: retired    Fish farm manager: RETIRED  Tobacco Use  . Smoking status: Former Smoker    Years: 20.00    Types: Cigarettes    Last attempt to quit: 08/16/1978    Years since quitting: 38.9  . Smokeless tobacco: Never Used  . Tobacco comment: 1 pack per week--10/16/12  Substance and Sexual Activity  . Alcohol use: No  . Drug use: No  . Sexual activity: No  Other Topics Concern  . None  Social History Narrative  . None    Family History  Problem Relation Age of Onset  . Lung cancer Father   . Breast cancer Sister   . Ovarian cancer Sister   . Colon cancer Neg Hx   . Esophageal cancer Neg Hx   . Stomach cancer Neg Hx   . Pancreatic cancer Neg Hx   . Liver disease Neg Hx      ROS Review of Systems See HPI Constitution: No fevers or chills No malaise No diaphoresis Skin: No rash or itching Eyes: no blurry vision, no double vision GU: no dysuria or  hematuria Neuro: no dizziness or headaches    Objective: Vitals:   07/06/17 0958  BP: 138/88  Pulse: 77  Resp: 17  Temp: 99.3 F (37.4 C)  TempSrc: Oral  SpO2: 100%  Weight: 237 lb 12.8 oz (107.9 kg)  Height: 5\' 2"  (1.575 m)    Physical Exam  Constitutional: She is oriented to person, place, and time. She appears well-developed and well-nourished.  HENT:  Head: Normocephalic and atraumatic.  Eyes: Conjunctivae and EOM are normal.  Cardiovascular: Normal rate, regular rhythm and normal heart sounds.  Pulmonary/Chest: Effort normal and breath sounds normal. No stridor. No respiratory distress.  Musculoskeletal:       Left ankle: She exhibits decreased range of motion. She exhibits no swelling, no ecchymosis, no deformity, no laceration and normal pulse. No tenderness. No lateral malleolus and no medial malleolus tenderness found. Achilles tendon exhibits no pain, no defect and normal Thompson's test results.  Neurological: She is alert and oriented to person, place, and time.    Assessment and Plan Binta was seen today for left ankle sprain.  Diagnoses and all orders for this visit:  Strain of left ankle, initial encounter Left ankle pain -  Advised pt to apply ace wrap and ice ankle as needed -     Apply ace wrap    Zoe A Nolon Rod

## 2017-07-06 NOTE — Patient Instructions (Addendum)
IF you received an x-ray today, you will receive an invoice from [facility]. Please contact [facility] at [phone] with questions or concerns regarding your invoice.   IF you received labwork today, you will receive an invoice from [place]. Please contact [facility] at [phone] with questions or concerns regarding your invoice.   Our billing staff will not be able to assist you with questions regarding bills from these companies.  You will be contacted with the lab results as soon as they are available. The fastest way to get your results is to activate your My Chart account. Instructions are located on the last page of this paperwork. If you have not heard from [place] regarding the results in 2 weeks, please contact this office.     Ankle Sprain An ankle sprain is a stretch or tear in one of the tough, fiber-like tissues (ligaments) in the ankle. The ligaments in your ankle help to hold the bones of the ankle together. What are the causes? This condition is often caused by stepping on or falling on the outer edge of the foot. What increases the risk? This condition is more likely to develop in people who play sports. What are the signs or symptoms? Symptoms of this condition include:  Pain in your ankle.  Swelling.  Bruising. Bruising may develop right after you sprain your ankle or 1-2 days later.  Trouble standing or walking, especially when you turn or change directions.  How is this diagnosed? This condition is diagnosed with a physical exam. During the exam, your health care provider will press on certain parts of your foot and ankle and try to move them in certain ways. X-rays may be taken to see how severe the sprain is and to check for broken bones. How is this treated? This condition may be treated with:  A brace. This is used to keep the ankle from moving until it heals.  An elastic bandage. This is used to support the  ankle.  Crutches.  Pain medicine.  Surgery. This may be needed if the sprain is severe.  Physical therapy. This may help to improve the range of motion in the ankle.  Follow these instructions at home:  Rest your ankle.  Take over-the-counter and prescription medicines only as told by your health care provider.  For 2-3 days, keep your ankle raised (elevated) above the level of your heart as much as possible.  If directed, apply ice to the area: ? Put ice in a plastic bag. ? Place a towel between your skin and the bag. ? Leave the ice on for 20 minutes, 2-3 times a day.  If you were given a brace: ? Wear it as directed. ? Remove it to shower or bathe. ? Try not to move your ankle much, but wiggle your toes from time to time. This helps to prevent swelling.  If you were given an elastic bandage (dressing): ? Remove it to shower or bathe. ? Try not to move your ankle much, but wiggle your toes from time to time. This helps to prevent swelling. ? Adjust the dressing to make it more comfortable if it feels too tight. ? Loosen the dressing if you have numbness or tingling in your foot, or if your foot becomes cold and blue.  If you have crutches, use them as told by your health care provider. Continue to use them until you can walk without feeling pain in your ankle. Contact a health care provider  if:  You have rapidly increasing bruising or swelling.  Your pain is not relieved with medicine. Get help right away if:  Your toes or foot becomes numb or blue.  You have severe pain that gets worse. This information is not intended to replace advice given to you by your health care provider. Make sure you discuss any questions you have with your health care provider. Document Released: 08/02/2005 Document Revised: 12/10/2015 Document Reviewed: 03/04/2015 Elsevier Interactive Patient Education  2017 [employer].

## 2017-07-21 ENCOUNTER — Other Ambulatory Visit: Payer: Self-pay

## 2017-07-21 ENCOUNTER — Ambulatory Visit (INDEPENDENT_AMBULATORY_CARE_PROVIDER_SITE_OTHER): Payer: Medicare Other | Admitting: Family Medicine

## 2017-07-21 ENCOUNTER — Encounter: Payer: Self-pay | Admitting: Family Medicine

## 2017-07-21 VITALS — BP 130/68 | HR 70 | Temp 98.4°F | Resp 16 | Ht 62.0 in | Wt 238.4 lb

## 2017-07-21 DIAGNOSIS — N898 Other specified noninflammatory disorders of vagina: Secondary | ICD-10-CM | POA: Diagnosis not present

## 2017-07-21 DIAGNOSIS — R300 Dysuria: Secondary | ICD-10-CM

## 2017-07-21 LAB — POCT WET + KOH PREP
Trich by wet prep: ABSENT
Yeast by KOH: ABSENT
Yeast by wet prep: ABSENT

## 2017-07-21 LAB — POCT URINALYSIS DIP (MANUAL ENTRY)
Bilirubin, UA: NEGATIVE
Blood, UA: NEGATIVE
Glucose, UA: NEGATIVE mg/dL
Ketones, POC UA: NEGATIVE mg/dL
Leukocytes, UA: NEGATIVE
Nitrite, UA: NEGATIVE
Protein Ur, POC: NEGATIVE mg/dL
Spec Grav, UA: 1.02 (ref 1.010–1.025)
Urobilinogen, UA: 0.2 E.U./dL
pH, UA: 5 (ref 5.0–8.0)

## 2017-07-21 MED ORDER — CLOTRIMAZOLE-BETAMETHASONE 1-0.05 % EX CREA: 1.0000 "application " | TOPICAL_CREAM | Freq: Two times a day (BID) | CUTANEOUS | 0 refills | Status: DC

## 2017-07-21 NOTE — Patient Instructions (Addendum)

## 2017-07-21 NOTE — Progress Notes (Signed)
Chief Complaint  Patient presents with  . Vaginal Itching    onset a couple weeks ago, has worsened. Tried OTC creams. Burns with urination. No vaginal discharge, odor, bleeding.     HPI  Pt reports that she had vaginal itching a couple weeks ago that has returned  She scratches herself and states that it is causing her to having burning with urination.  No vaginal discharge odor or bleeding    Past Medical History:  Diagnosis Date  . Arthritis    knees, back  . Cancer [facility]) 2004   breast  . Depression   . GERD (gastroesophageal reflux disease)   . Glaucoma   . Heart palpitations   . High cholesterol   . History of transfusion    many years ago  . Hypertension   . Hypothyroidism   . Itching in the vaginal area   . Nocturia   . OSA (obstructive sleep apnea)    tried CPAP- but unsuccessful  . Renal disorder   . Thyroid disease    thyroid removed    Current Outpatient Medications  Medication Sig Dispense Refill  . cetirizine (ZYRTEC) 10 MG tablet Take 1 tablet (10 mg total) by mouth daily. 30 tablet 11  . diltiazem (CARDIZEM CD) 120 MG 24 hr capsule Take 1 capsule (120 mg total) by mouth daily. 90 capsule 1  . esomeprazole (NEXIUM) 40 MG capsule Take 1 capsule (40 mg total) by mouth daily. 90 capsule 3  . fluticasone (FLONASE) 50 MCG/ACT nasal spray Place 1 spray into both nostrils daily as needed for allergies.     Marland Kitchen latanoprost (XALATAN) 0.005 % ophthalmic solution     . levothyroxine (SYNTHROID, LEVOTHROID) 50 MCG tablet Take 1 tablet (50 mcg total) by mouth daily. 90 tablet 1  . polyethylene glycol powder (GLYCOLAX/MIRALAX) powder Take 17 g by mouth 2 (two) times daily as needed. 289 g 1  . rosuvastatin (CRESTOR) 10 MG tablet Take 1 tablet (10 mg total) by mouth at bedtime. 90 tablet 1  . spironolactone (ALDACTONE) 50 MG tablet take 1 tablet by mouth once daily 90 tablet 1  . venlafaxine (EFFEXOR) 50 MG tablet Take 1 tablet (50 mg total) by mouth 2 (two) times  daily with a meal. 180 tablet 0  . clotrimazole-betamethasone (LOTRISONE) cream Apply 1 application topically 2 (two) times daily. 30 g 0   No current facility-administered medications for this visit.     Allergies:  Allergies  Allergen Reactions  . Codeine Other (See Comments)    confusion  . Lipitor [Atorvastatin Calcium] Itching    Past Surgical History:  Procedure Laterality Date  . ABDOMINAL HYSTERECTOMY  83   BSO  . BREAST SURGERY  2004   left breast removed due to cancer no chemo or radiation  . MASTECTOMY Left   . THYROIDECTOMY     due to knot  . 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 Marital status: Widowed    Spouse name: None  . Number of children: None  . Years of education: None  . Highest education level: None  Social Needs  . Financial resource strain: None  . Food insecurity - worry: None  . Food insecurity - inability: None  . Transportation needs - medical: None  . Transportation needs - non-medical: None  Occupational History  .  Occupation: retired    Fish farm manager: RETIRED  Tobacco Use  . Smoking status: Former Smoker    Years: 20.00    Types: Cigarettes    Last attempt to quit: 08/16/1978    Years since quitting: 38.9  . Smokeless tobacco: Never Used  . Tobacco comment: 1 pack per week--10/16/12  Substance and Sexual Activity  . Alcohol use: No  . Drug use: No  . Sexual activity: No  Other Topics Concern  . None  Social History Narrative  . None    Family History  Problem Relation Age of Onset  . Lung cancer Father   . Breast cancer Sister   . Ovarian cancer Sister   . Colon cancer Neg Hx   . Esophageal cancer Neg Hx   . Stomach cancer Neg Hx   . Pancreatic cancer Neg Hx   . Liver disease Neg Hx      ROS Review of Systems See HPI Constitution: No  fevers or chills No malaise No diaphoresis Skin: No rash or itching Eyes: no blurry vision, no double vision GU: see hpi Neuro: no dizziness or headaches  all others reviewed and negative   Objective: Vitals:   07/21/17 1337  BP: 130/68  Pulse: 70  Resp: 16  Temp: 98.4 F (36.9 C)  SpO2: 98%  Weight: 238 lb 6.4 oz (108.1 kg)  Height: 5\' 2"  (1.575 m)    Physical Exam  Constitutional: She appears well-developed and well-nourished.  HENT:  Head: Normocephalic and atraumatic.  Eyes: Conjunctivae and EOM are normal.   Chaperone present Vaginal exam Labia normal bilaterally without skin lesions Urethral meatus normal appearing without erythema Vagina without discharge No CMT, ovaries small and not palpable Uterus midline, nontender   Wet prep- moderate epithelial cells, no yeast, no clue cells, no wbc, no trich UA neg blood, ketones, nitrites, leukocytes  Assessment and Plan Terrisa was seen today for vaginal itching.  Diagnoses and all orders for this visit:  Vaginal itching- no yeast vaginitis Advised to follow up with gynecology as she may have other causes of vaginal itching  Pt agreed to follow up -     POCT Wet + KOH Prep  Dysuria- no uti Advised pt to use lotrisone and continue hydration -     POCT urinalysis dipstick  Other orders -     clotrimazole-betamethasone (LOTRISONE) cream; Apply 1 application topically 2 (two) times daily.     [place]

## 2017-08-15 LAB — VITAMIN D 25 HYDROXY (VIT D DEFICIENCY, FRACTURES): Vit D, 25-Hydroxy: 21.3

## 2017-08-15 LAB — BASIC METABOLIC PANEL: Creatinine: 1.4 — AB (ref <=1.1)

## 2017-08-29 ENCOUNTER — Ambulatory Visit: Payer: Medicare Other | Admitting: Family Medicine

## 2017-08-29 ENCOUNTER — Other Ambulatory Visit: Payer: Self-pay

## 2017-08-29 ENCOUNTER — Encounter: Payer: Self-pay | Admitting: Family Medicine

## 2017-08-29 VITALS — BP 140/70 | HR 75 | Temp 98.6°F | Resp 17 | Ht 62.0 in | Wt 237.0 lb

## 2017-08-29 DIAGNOSIS — J0100 Acute maxillary sinusitis, unspecified: Secondary | ICD-10-CM | POA: Diagnosis not present

## 2017-08-29 MED ORDER — AMOXICILLIN-POT CLAVULANATE 875-125 MG PO TABS: 1.0000 | ORAL_TABLET | Freq: Two times a day (BID) | ORAL | 0 refills | Status: DC

## 2017-08-29 MED ORDER — CETIRIZINE HCL 10 MG PO TABS: 10.0000 mg | ORAL_TABLET | Freq: Every day | ORAL | 11 refills | Status: DC

## 2017-08-29 MED ORDER — FLUCONAZOLE 150 MG PO TABS: 150.0000 mg | ORAL_TABLET | Freq: Once | ORAL | 0 refills | Status: AC

## 2017-08-29 NOTE — Progress Notes (Signed)
Chief Complaint  Patient presents with  . Sinusitis    x couple weeks but has worsened over the past week.  Yellow drainage with blood and bad breath    HPI   Pt reports that she has been having frontal sinus headache, puffy eyes, as well as yellow discharge from the nose with occasional blood in it She reports that she has tried zyrtec, sinus decongestant med as well as increasing her fluids She stopped flonase as it makes her nose bleed as well She denies fevers  She reports that she is coughing especially early mornings when the mucus drains out of her sinuses into her throat She reports that she has dry  Mouth from all the congestion She reports that onset was 2 weeks ago  She has been getting worse daily     Past Medical History:  Diagnosis Date  . Arthritis    knees, back  . Cancer [facility]) 2004   breast  . Depression   . GERD (gastroesophageal reflux disease)   . Glaucoma   . Heart palpitations   . High cholesterol   . History of transfusion    many years ago  . Hypertension   . Hypothyroidism   . Itching in the vaginal area   . Nocturia   . OSA (obstructive sleep apnea)    tried CPAP- but unsuccessful  . Renal disorder   . Thyroid disease    thyroid removed    Current Outpatient Medications  Medication Sig Dispense Refill  . cetirizine (ZYRTEC) 10 MG tablet Take 1 tablet (10 mg total) by mouth daily. 30 tablet 11  . diltiazem (CARDIZEM CD) 120 MG 24 hr capsule Take 1 capsule (120 mg total) by mouth daily. 90 capsule 1  . esomeprazole (NEXIUM) 40 MG capsule Take 1 capsule (40 mg total) by mouth daily. 90 capsule 3  . latanoprost (XALATAN) 0.005 % ophthalmic solution     . levothyroxine (SYNTHROID, LEVOTHROID) 50 MCG tablet Take 1 tablet (50 mcg total) by mouth daily. 90 tablet 1  . polyethylene glycol powder (GLYCOLAX/MIRALAX) powder Take 17 g by mouth 2 (two) times daily as needed. 289 g 1  . rosuvastatin (CRESTOR) 10 MG tablet Take 1 tablet (10 mg total) by  mouth at bedtime. 90 tablet 1  . spironolactone (ALDACTONE) 50 MG tablet take 1 tablet by mouth once daily 90 tablet 1  . venlafaxine (EFFEXOR) 50 MG tablet Take 1 tablet (50 mg total) by mouth 2 (two) times daily with a meal. 180 tablet 0  . amoxicillin-clavulanate (AUGMENTIN) 875-125 MG tablet Take 1 tablet by mouth 2 (two) times daily. 20 tablet 0  . fluconazole (DIFLUCAN) 150 MG tablet Take 1 tablet (150 mg total) by mouth once for 1 dose. Take 2nd dose in 3 days 2 tablet 0   No current facility-administered medications for this visit.     Allergies:  Allergies  Allergen Reactions  . Codeine Other (See Comments)    confusion  . Lipitor [Atorvastatin Calcium] Itching    Past Surgical History:  Procedure Laterality Date  . ABDOMINAL HYSTERECTOMY  83   BSO  . BREAST SURGERY  2004   left breast removed due to cancer no chemo or radiation  . MASTECTOMY Left   . THYROIDECTOMY     due to knot  . TOTAL KNEE ARTHROPLASTY Left 07/28/2015   Procedure: LEFT TOTAL KNEE ARTHROPLASTY;  Surgeon: Gaynelle Arabian, MD;  Location: [facility];  Service: Orthopedics;  Laterality: Left;  Social History   Socioeconomic History  . Marital status: Widowed    Spouse name: None  . Number of children: None  . Years of education: None  . Highest education level: None  Social Needs  . Financial resource strain: None  . Food insecurity - worry: None  . Food insecurity - inability: None  . Transportation needs - medical: None  . Transportation needs - non-medical: None  Occupational History  . Occupation: retired    Fish farm manager: RETIRED  Tobacco Use  . Smoking status: Former Smoker    Years: 20.00    Types: Cigarettes    Last attempt to quit: 08/16/1978    Years since quitting: 39.0  . Smokeless tobacco: Never Used  . Tobacco comment: 1 pack per week--10/16/12  Substance and Sexual Activity  . Alcohol use: No  . Drug use: No  . Sexual activity: No  Other Topics Concern  . None  Social History  Narrative  . None    Family History  Problem Relation Age of Onset  . Lung cancer Father   . Breast cancer Sister   . Ovarian cancer Sister   . Colon cancer Neg Hx   . Esophageal cancer Neg Hx   . Stomach cancer Neg Hx   . Pancreatic cancer Neg Hx   . Liver disease Neg Hx      ROS Review of Systems See HPI Constitution: No fevers or chills No malaise No diaphoresis Skin: No rash or itching Eyes: no blurry vision, no double vision GU: no dysuria or hematuria Neuro: no dizziness or headaches all others reviewed and negative   Objective: Vitals:   08/29/17 1549  BP: 140/70  Pulse: 75  Resp: 17  Temp: 98.6 F (37 C)  TempSrc: Oral  SpO2: 99%  Weight: 237 lb (107.5 kg)  Height: 5\' 2"  (1.575 m)    Physical Exam General: alert, oriented, in NAD Head: normocephalic, atraumatic, + frontal and maxillary sinus tenderness Eyes: EOM intact, no scleral icterus or conjunctival injection Ears: TM clear bilaterally Nose: mucosa +erythematous, +edematous Throat: no pharyngeal exudate or erythema Lymph: no posterior auricular, submental or cervical lymph adenopathy Heart: normal rate, normal sinus rhythm, no murmurs Lungs: clear to auscultation bilaterally, no wheezing   Assessment and Plan Alyssa Jefferson was seen today for sinusitis.  Diagnoses and all orders for this visit:  Acute non-recurrent maxillary sinusitis  Discussed antibiotic Gave diflucan for yeast prophylaxis Antihistamine Sinus rinse Return to clinic if no improvement -     amoxicillin-clavulanate (AUGMENTIN) 875-125 MG tablet; Take 1 tablet by mouth 2 (two) times daily. -     fluconazole (DIFLUCAN) 150 MG tablet; Take 1 tablet (150 mg total) by mouth once for 1 dose. Take 2nd dose in 3 days -     cetirizine (ZYRTEC) 10 MG tablet; Take 1 tablet (10 mg total) by mouth daily.     [place]

## 2017-08-29 NOTE — Patient Instructions (Addendum)
     IF you received an x-ray today, you will receive an invoice from [facility]. Please contact [facility] at [phone] with questions or concerns regarding your invoice.   IF you received labwork today, you will receive an invoice from [place]. Please contact [facility] at [phone] with questions or concerns regarding your invoice.   Our billing staff will not be able to assist you with questions regarding bills from these companies.  You will be contacted with the lab results as soon as they are available. The fastest way to get your results is to activate your My Chart account. Instructions are located on the last page of this paperwork. If you have not heard from [place] regarding the results in 2 weeks, please contact this office.    Sinus Rinse What is a sinus rinse? A sinus rinse is a simple home treatment that is used to rinse your sinuses with a sterile mixture of salt and water (saline solution). Sinuses are air-filled spaces in your skull behind the bones of your face and forehead that open into your nasal cavity. You will use the following:  Saline solution.  Neti pot or spray bottle. This releases the saline solution into your nose and through your sinuses. Neti pots and spray bottles can be purchased at [employer], a health food store, or online.  When would I do a sinus rinse? A sinus rinse can help to clear mucus, dirt, dust, or pollen from the nasal cavity. You may do a sinus rinse when you have a cold, a virus, nasal allergy symptoms, a sinus infection, or stuffiness in the nose or sinuses. If you are considering a sinus rinse:  Ask your child's health care provider before performing a sinus rinse on your child.  Do not do a sinus rinse if you have had ear or nasal surgery, ear infection, or blocked ears.  How do I do a sinus rinse?  Wash your hands.  Disinfect your device according to the directions provided and then dry it.  Use  the solution that comes with your device or one that is sold separately in stores. Follow the mixing directions on the package.  Fill your device with the amount of saline solution as directed by the device instructions.  Stand over a sink and tilt your head sideways over the sink.  Place the spout of the device in your upper nostril (the one closer to the ceiling).  Gently pour or squeeze the saline solution into the nasal cavity. The liquid should drain to the lower nostril if you are not overly congested.  Gently blow your nose. Blowing too hard may cause ear pain.  Repeat in the other nostril.  Clean and rinse your device with clean water and then air-dry it. Are there risks of a sinus rinse? Sinus rinse is generally very safe and effective. However, there are a few risks, which include:  A burning sensation in the sinuses. This may happen if you do not make the saline solution as directed. Make sure to follow all directions when making the saline solution.  Infection from contaminated water. This is rare, but possible.  Nasal irritation.  This information is not intended to replace advice given to you by your health care provider. Make sure you discuss any questions you have with your health care provider. Document Released: 02/27/2014 Document Revised: 06/29/2016 Document Reviewed: 12/18/2013 Elsevier Interactive Patient Education  2017 [employer].

## 2017-09-12 ENCOUNTER — Encounter: Payer: Self-pay | Admitting: Family Medicine

## 2017-09-12 DIAGNOSIS — N2581 Secondary hyperparathyroidism of renal origin: Secondary | ICD-10-CM | POA: Insufficient documentation

## 2017-09-12 DIAGNOSIS — N183 Chronic kidney disease, stage 3 unspecified: Secondary | ICD-10-CM | POA: Insufficient documentation

## 2017-10-17 ENCOUNTER — Ambulatory Visit: Payer: Self-pay | Admitting: *Deleted

## 2017-10-17 NOTE — Telephone Encounter (Signed)
Pt called stating that her BP has been elevated over the weekend 160-170/70s; she is also having blurred vision; nurse triage initiated and recommendations made to include pt going to ED; unable to assign disposition because pt disconnected before verbalizing understanding; will route to office for notification of this encounter; spoke with [facility].    Reason for Disposition . [5] Systolic BP  >= 329 OR Diastolic >= 924 AND [2] cardiac or neurologic symptoms (e.g., chest pain, difficulty breathing, unsteady gait, blurred vision)  Answer Assessment - Initial Assessment Questions 1. BLOOD PRESSURE: "What is the blood pressure?" "Did you take at least two measurements 5 minutes apart?"     10/17/17 at 0800 168/70 2. ONSET: "When did you take your blood pressure?"     0800 10/17/17 3. HOW: "How did you obtain the blood pressure?" (e.g., visiting nurse, automatic home BP monitor)     Automatic blood pressure cuff on left arm  4. HISTORY: "Do you have a history of high blood pressure?"     yes 5. MEDICATIONS: "Are you taking any medications for blood pressure?" "Have you missed any doses recently?"     Diltiazem 6. OTHER SYMPTOMS: "Do you have any symptoms?" (e.g., headache, chest pain, blurred vision, difficulty breathing, weakness)     Blurred vision, , slight headache rated 3 out of 10  7. PREGNANCY: "Is there any chance you are pregnant?" "When was your last menstrual period?"     no  Protocols used: HIGH BLOOD PRESSURE-A-AH

## 2017-10-19 ENCOUNTER — Encounter: Payer: Self-pay | Admitting: Family Medicine

## 2017-10-19 ENCOUNTER — Ambulatory Visit (INDEPENDENT_AMBULATORY_CARE_PROVIDER_SITE_OTHER): Payer: Medicare Other | Admitting: Family Medicine

## 2017-10-19 ENCOUNTER — Other Ambulatory Visit: Payer: Self-pay

## 2017-10-19 VITALS — BP 138/72 | HR 62 | Temp 98.7°F | Resp 16 | Ht 62.0 in | Wt 232.4 lb

## 2017-10-19 DIAGNOSIS — M545 Low back pain, unspecified: Secondary | ICD-10-CM

## 2017-10-19 DIAGNOSIS — I10 Essential (primary) hypertension: Secondary | ICD-10-CM | POA: Diagnosis not present

## 2017-10-19 DIAGNOSIS — H538 Other visual disturbances: Secondary | ICD-10-CM

## 2017-10-19 LAB — POCT URINALYSIS DIP (MANUAL ENTRY)
Bilirubin, UA: NEGATIVE
Glucose, UA: NEGATIVE mg/dL
Ketones, POC UA: NEGATIVE mg/dL
Leukocytes, UA: NEGATIVE
Nitrite, UA: NEGATIVE
Protein Ur, POC: 30 mg/dL — AB
Spec Grav, UA: >=1.03 — AB (ref 1.010–1.025)
Urobilinogen, UA: 0.2 E.U./dL
pH, UA: 5.5 (ref 5.0–8.0)

## 2017-10-19 NOTE — Progress Notes (Signed)
Chief Complaint  Patient presents with  . Back Pain    x few weeks, maybe from taking care of sick sister but the pain is intermittent but it is really bad today.  Pain level 7/10.  Taking tylenol with no relief    HPI   Pt states that she has been having right side back pain that started a few weeks ago bending at the waist and leaning forward has been painful for her She has a hard time straightening up She states that she used to pick up her sister to help her out of the bed She states that the pain stays in the buttocks but does not radiate to the thigh She can find a comfortable position and sleeps well at night She does not walk for exercise She denies numbness of the groin  Hypertension She reports that her home bp monitor was 160/90 She states that she also has blurry vision  She checks her bp because of her vision and then gets high readings Her last eye exam 2018  Past Medical History:  Diagnosis Date  . Arthritis    knees, back  . Cancer [facility]) 2004   breast  . Depression   . GERD (gastroesophageal reflux disease)   . Glaucoma   . Heart palpitations   . High cholesterol   . History of transfusion    many years ago  . Hypertension   . Hypothyroidism   . Itching in the vaginal area   . Nocturia   . OSA (obstructive sleep apnea)    tried CPAP- but unsuccessful  . Renal disorder   . Thyroid disease    thyroid removed    Current Outpatient Medications  Medication Sig Dispense Refill  . cetirizine (ZYRTEC) 10 MG tablet Take 1 tablet (10 mg total) by mouth daily. 30 tablet 11  . diltiazem (CARDIZEM CD) 120 MG 24 hr capsule Take 1 capsule (120 mg total) by mouth daily. 90 capsule 1  . latanoprost (XALATAN) 0.005 % ophthalmic solution     . levothyroxine (SYNTHROID, LEVOTHROID) 50 MCG tablet Take 1 tablet (50 mcg total) by mouth daily. 90 tablet 1  . rosuvastatin (CRESTOR) 10 MG tablet Take 1 tablet (10 mg total) by mouth at bedtime. 90 tablet 1  .  spironolactone (ALDACTONE) 50 MG tablet take 1 tablet by mouth once daily 90 tablet 1   No current facility-administered medications for this visit.     Allergies:  Allergies  Allergen Reactions  . Codeine Other (See Comments)    confusion  . Lipitor [Atorvastatin Calcium] Itching    Past Surgical History:  Procedure Laterality Date  . ABDOMINAL HYSTERECTOMY  83   BSO  . BREAST SURGERY  2004   left breast removed due to cancer no chemo or radiation  . MASTECTOMY Left   . THYROIDECTOMY     due to knot  . 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 Marital status: Widowed    Spouse name: None  . Number of children: None  . Years of education: None  . Highest education level: None  Social Needs  . Financial resource strain: None  . Food insecurity - worry: None  . Food insecurity - inability: None  . Transportation needs - medical: None  . Transportation needs - non-medical: None  Occupational  History  . Occupation: retired    Fish farm manager: RETIRED  Tobacco Use  . Smoking status: Former Smoker    Years: 20.00    Types: Cigarettes    Last attempt to quit: 08/16/1978    Years since quitting: 39.2  . Smokeless tobacco: Never Used  . Tobacco comment: 1 pack per week--10/16/12  Substance and Sexual Activity  . Alcohol use: No  . Drug use: No  . Sexual activity: No  Other Topics Concern  . None  Social History Narrative  . None    Family History  Problem Relation Age of Onset  . Lung cancer Father   . Breast cancer Sister   . Ovarian cancer Sister   . Colon cancer Neg Hx   . Esophageal cancer Neg Hx   . Stomach cancer Neg Hx   . Pancreatic cancer Neg Hx   . Liver disease Neg Hx      ROS Review of Systems See HPI Constitution: No fevers or chills No malaise No diaphoresis Skin: No  rash or itching Eyes: no blurry vision, no double vision GU: no dysuria or hematuria Neuro: no dizziness or headaches all others reviewed and negative   Objective: Vitals:   10/19/17 1624  BP: 138/72  Pulse: 62  Resp: 16  Temp: 98.7 F (37.1 C)  TempSrc: Oral  SpO2: 99%  Weight: 232 lb 6.4 oz (105.4 kg)  Height: 5\' 2"  (1.575 m)   BP Readings from Last 3 Encounters:  10/19/17 138/72  08/29/17 140/70  07/21/17 130/68     Physical Exam  Constitutional: She is oriented to person, place, and time. She appears well-developed and well-nourished.  HENT:  Head: Normocephalic and atraumatic.  Eyes: Conjunctivae and EOM are normal.  Cardiovascular: Normal rate, regular rhythm and normal heart sounds.  No murmur heard. Pulmonary/Chest: Effort normal and breath sounds normal. No stridor. No respiratory distress.  Musculoskeletal:       Lumbar back: She exhibits normal range of motion, no tenderness, no bony tenderness, no swelling, no edema, no deformity, no laceration, no pain, no spasm and normal pulse.  Straight leg raise negative. Log rolling negative.   Neurological: She is alert and oriented to person, place, and time.  Psychiatric: She has a normal mood and affect. Her behavior is normal. Judgment and thought content normal.     Assessment and Plan Hiya was seen today for back pain.  Diagnoses and all orders for this visit:  Acute low back pain, unspecified back pain laterality, with sciatica presence unspecified -     POCT urinalysis dipstick -     Ambulatory referral to Physical Therapy  Acute right-sided low back pain without sciatica- referred to PT for exercises and pain relief Gave some home instructions -     Ambulatory referral to Physical Therapy  Essential hypertension- bp in good range, advised to get a new bp cuff.  Blurry vision- advised eye exam for hypertension     Alyssa Jefferson

## 2017-10-19 NOTE — Patient Instructions (Signed)
     IF you received an x-ray today, you will receive an invoice from [facility]. Please contact [facility] at [phone] with questions or concerns regarding your invoice.   IF you received labwork today, you will receive an invoice from [facility]. Please contact [facility] at [phone] with questions or concerns regarding your invoice.   Our billing staff will not be able to assist you with questions regarding bills from these companies.  You will be contacted with the lab results as soon as they are available. The fastest way to get your results is to activate your My Chart account. Instructions are located on the last page of this paperwork. If you have not heard from us regarding the results in 2 weeks, please contact this office.     

## 2017-10-24 ENCOUNTER — Other Ambulatory Visit: Payer: Self-pay

## 2017-10-24 ENCOUNTER — Ambulatory Visit: Payer: Medicare Other | Admitting: Family Medicine

## 2017-10-24 ENCOUNTER — Encounter: Payer: Self-pay | Admitting: Family Medicine

## 2017-10-24 VITALS — BP 135/77 | HR 87 | Temp 100.1°F | Resp 16 | Ht 62.0 in | Wt 229.4 lb

## 2017-10-24 DIAGNOSIS — J0100 Acute maxillary sinusitis, unspecified: Secondary | ICD-10-CM

## 2017-10-24 MED ORDER — FLUCONAZOLE 150 MG PO TABS: 150.0000 mg | ORAL_TABLET | Freq: Once | ORAL | 0 refills | Status: AC

## 2017-10-24 MED ORDER — AMOXICILLIN-POT CLAVULANATE 875-125 MG PO TABS
1.0000 | ORAL_TABLET | Freq: Two times a day (BID) | ORAL | 0 refills | Status: DC
Start: 2017-10-24 — End: 2017-12-08

## 2017-10-24 NOTE — Progress Notes (Signed)
Chief Complaint  Patient presents with  . ? allergies    since Friday, no appetite, low grade temp 99.1, nasal congestion, cough    HPI    Pt reports that she has been having facial pain, elevated temperatures, congestion of the nose and cough Cough is productive of yellow sputum Feels very tired  No dizziness, no nausea or vomiting No diarrhea    Past Medical History:  Diagnosis Date  . Arthritis    knees, back  . Cancer [facility]) 2004   breast  . Depression   . GERD (gastroesophageal reflux disease)   . Glaucoma   . Heart palpitations   . High cholesterol   . History of transfusion    many years ago  . Hypertension   . Hypothyroidism   . Itching in the vaginal area   . Nocturia   . OSA (obstructive sleep apnea)    tried CPAP- but unsuccessful  . Renal disorder   . Thyroid disease    thyroid removed    Current Outpatient Medications  Medication Sig Dispense Refill  . cetirizine (ZYRTEC) 10 MG tablet Take 1 tablet (10 mg total) by mouth daily. 30 tablet 11  . latanoprost (XALATAN) 0.005 % ophthalmic solution     . levothyroxine (SYNTHROID, LEVOTHROID) 50 MCG tablet Take 1 tablet (50 mcg total) by mouth daily. 90 tablet 1  . rosuvastatin (CRESTOR) 10 MG tablet Take 1 tablet (10 mg total) by mouth at bedtime. 90 tablet 1  . spironolactone (ALDACTONE) 50 MG tablet take 1 tablet by mouth once daily 90 tablet 1  . amoxicillin-clavulanate (AUGMENTIN) 875-125 MG tablet Take 1 tablet by mouth 2 (two) times daily. 20 tablet 0  . diltiazem (CARDIZEM CD) 120 MG 24 hr capsule Take 1 capsule (120 mg total) by mouth daily. 90 capsule 1   No current facility-administered medications for this visit.     Allergies:  Allergies  Allergen Reactions  . Codeine Other (See Comments)    confusion  . Lipitor [Atorvastatin Calcium] Itching    Past Surgical History:  Procedure Laterality Date  . ABDOMINAL HYSTERECTOMY  83   BSO  . BREAST SURGERY  2004   left breast removed due  to cancer no chemo or radiation  . MASTECTOMY Left   . THYROIDECTOMY     due to knot  . 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 Marital status: Widowed    Spouse name: Not on file  . Number of children: Not on file  . Years of education: Not on file  . Highest education level: Not on file  Occupational History  . Occupation: retired    Fish farm manager: RETIRED  Social Needs  . Financial resource strain: Not on file  . Food insecurity:    Worry: Not on file    Inability: Not on file  . Transportation needs:    Medical: Not on file    Non-medical: Not on file  Tobacco Use  . Smoking status: Former Smoker    Years: 20.00    Types: Cigarettes    Last attempt to quit: 08/16/1978    Years since quitting: 39.2  . Smokeless tobacco: Never Used  . Tobacco comment: 1 pack per week--10/16/12  Substance and Sexual Activity  . Alcohol use: No  . Drug use: No  .  Sexual activity: Never  Lifestyle  . Physical activity:    Days per week: Not on file    Minutes per session: Not on file  . Stress: Not on file  Relationships  . Social connections:    Talks on phone: Not on file    Gets together: Not on file    Attends religious service: Not on file    Active member of club or organization: Not on file    Attends meetings of clubs or organizations: Not on file    Relationship status: Not on file  Other Topics Concern  . Not on file  Social History Narrative  . Not on file    Family History  Problem Relation Age of Onset  . Lung cancer Father   . Breast cancer Sister   . Ovarian cancer Sister   . Colon cancer Neg Hx   . Esophageal cancer Neg Hx   . Stomach cancer Neg Hx   . Pancreatic cancer Neg Hx   . Liver disease Neg Hx      ROS Review of Systems See HPI Constitution: No fevers or  chills +malaise No diaphoresis Skin: No rash or itching Eyes: no blurry vision, no double vision GU: no dysuria or hematuria Neuro: no dizziness or headaches  all others reviewed and negative   Objective: Vitals:   10/24/17 1503  BP: 135/77  Pulse: 87  Resp: 16  Temp: 100.1 F (37.8 C)  TempSrc: Oral  SpO2: 96%  Weight: 229 lb 6.4 oz (104.1 kg)  Height: 5\' 2"  (1.575 m)    Physical Exam General: alert, oriented, in NAD Head: normocephalic, atraumatic, +frontal and maxillary sinus tenderness Eyes: EOM intact, no scleral icterus or conjunctival injection Ears: TM clear bilaterally Nose: mucosa nonerythematous, nonedematous Throat: no pharyngeal exudate or erythema Lymph: no posterior auricular, submental or cervical lymph adenopathy Heart: normal rate, normal sinus rhythm, no murmurs Lungs: clear to auscultation bilaterally, no wheezing   Assessment and Plan [name] was seen today for ? allergies.  Diagnoses and all orders for this visit:  Acute non-recurrent maxillary sinusitis  Continue otc cough meds Added abx Diflucan in case she gets vaginitis -     amoxicillin-clavulanate (AUGMENTIN) 875-125 MG tablet; Take 1 tablet by mouth 2 (two) times daily. -     fluconazole (DIFLUCAN) 150 MG tablet; Take 1 tablet (150 mg total) by mouth once for 1 dose. Repeat second dose in 3 days     Alyssa Jefferson

## 2017-10-24 NOTE — Patient Instructions (Addendum)
     IF you received an x-ray today, you will receive an invoice from [facility]. Please contact [facility] at [phone] with questions or concerns regarding your invoice.   IF you received labwork today, you will receive an invoice from [facility]. Please contact [facility] at [phone] with questions or concerns regarding your invoice.   Our billing staff will not be able to assist you with questions regarding bills from these companies.  You will be contacted with the lab results as soon as they are available. The fastest way to get your results is to activate your My Chart account. Instructions are located on the last page of this paperwork. If you have not heard from us regarding the results in 2 weeks, please contact this office.     Sinusitis, Adult Sinusitis is soreness and inflammation of your sinuses. Sinuses are hollow spaces in the bones around your face. They are located:  Around your eyes.  In the middle of your forehead.  Behind your nose.  In your cheekbones.  Your sinuses and nasal passages are lined with a stringy fluid (mucus). Mucus normally drains out of your sinuses. When your nasal tissues get inflamed or swollen, the mucus can get trapped or blocked so air cannot flow through your sinuses. This lets bacteria, viruses, and funguses grow, and that leads to infection. Follow these instructions at home: Medicines  Take, use, or apply over-the-counter and prescription medicines only as told by your doctor. These may include nasal sprays.  If you were prescribed an antibiotic medicine, take it as told by your doctor. Do not stop taking the antibiotic even if you start to feel better. Hydrate and Humidify  Drink enough water to keep your pee (urine) clear or pale yellow.  Use a cool mist humidifier to keep the humidity level in your home above 50%.  Breathe in steam for 10-15 minutes, 3-4 times a day or as told by your doctor. You can do  this in the bathroom while a hot shower is running.  Try not to spend time in cool or dry air. Rest  Rest as much as possible.  Sleep with your head raised (elevated).  Make sure to get enough sleep each night. General instructions  Put a warm, moist washcloth on your face 3-4 times a day or as told by your doctor. This will help with discomfort.  Wash your hands often with soap and water. If there is no soap and water, use hand sanitizer.  Do not smoke. Avoid being around people who are smoking (secondhand smoke).  Keep all follow-up visits as told by your doctor. This is important. Contact a doctor if:  You have a fever.  Your symptoms get worse.  Your symptoms do not get better within 10 days. Get help right away if:  You have a very bad headache.  You cannot stop throwing up (vomiting).  You have pain or swelling around your face or eyes.  You have trouble seeing.  You feel confused.  Your neck is stiff.  You have trouble breathing. This information is not intended to replace advice given to you by your health care provider. Make sure you discuss any questions you have with your health care provider. Document Released: 01/19/2008 Document Revised: 03/28/2016 Document Reviewed: 05/28/2015 Elsevier Interactive Patient Education  2018 Elsevier Inc.  

## 2017-10-25 ENCOUNTER — Telehealth: Payer: Self-pay | Admitting: Family Medicine

## 2017-10-25 NOTE — Telephone Encounter (Signed)
Copied from [place] [id number]. Topic: Quick Communication - Rx Refill/Question >> Oct 25, 2017  6:33 PM Waylan Rocher, [place] L wrote: Medication: diltiazem (CARDIZEM CD) 120 MG 24 hr capsule   Has the patient contacted their pharmacy? Yes.    (Agent: If no, request that the patient contact the pharmacy for the refill.)  Preferred Pharmacy (with phone number or street name): [facility] [place] [address] [place] [address] Phone: [phone] Fax: [phone]  Agent: Please be advised that RX refills may take up to 3 business days. We ask that you follow-up with your pharmacy.

## 2017-10-26 MED ORDER — DILTIAZEM HCL ER COATED BEADS 120 MG PO CP24: 120.0000 mg | ORAL_CAPSULE | Freq: Every day | ORAL | 1 refills | Status: DC

## 2017-10-26 NOTE — Telephone Encounter (Signed)
Pt states that the rx for diltiazem (CARDIZEM CD) 120 MG  Is not what she normally gets. She states usually it is for 180mg  and she would like to see if this could be changed.

## 2017-10-26 NOTE — Telephone Encounter (Signed)
Checking status, call back [phone]. Patient is out of meds.

## 2017-10-26 NOTE — Telephone Encounter (Signed)
Last office visit:  10/19/17 PCP: Dr. Nolon Rod Pharmacy: [facility] [address].  Diltiazem 120 mg. Capsule refill per protocol.  Pt. Notified per phone of refill authorized.

## 2017-10-28 NOTE — Telephone Encounter (Signed)
Pt states the 120mg  isnt the same strength, she was given 180mg  on her last refill, but pt stated it was given by cardiologist, pt was asked to contact that office, contact pt if needed

## 2017-11-02 NOTE — Telephone Encounter (Signed)
Phone call to patient. She confirms she was able to contact cardiology regarding diltiazem. Has no further questions.

## 2017-11-07 ENCOUNTER — Ambulatory Visit: Payer: Medicare Other | Attending: Family Medicine | Admitting: Physical Therapy

## 2017-11-07 DIAGNOSIS — R262 Difficulty in walking, not elsewhere classified: Secondary | ICD-10-CM | POA: Insufficient documentation

## 2017-11-07 DIAGNOSIS — M5441 Lumbago with sciatica, right side: Secondary | ICD-10-CM | POA: Insufficient documentation

## 2017-11-07 DIAGNOSIS — M6281 Muscle weakness (generalized): Secondary | ICD-10-CM | POA: Insufficient documentation

## 2017-11-07 DIAGNOSIS — G8929 Other chronic pain: Secondary | ICD-10-CM | POA: Insufficient documentation

## 2017-11-07 DIAGNOSIS — M62830 Muscle spasm of back: Secondary | ICD-10-CM | POA: Insufficient documentation

## 2017-11-10 ENCOUNTER — Encounter: Payer: Self-pay | Admitting: Physical Therapy

## 2017-11-10 ENCOUNTER — Other Ambulatory Visit: Payer: Self-pay

## 2017-11-10 ENCOUNTER — Ambulatory Visit: Payer: Medicare Other | Admitting: Physical Therapy

## 2017-11-10 DIAGNOSIS — M5441 Lumbago with sciatica, right side: Principal | ICD-10-CM

## 2017-11-10 DIAGNOSIS — R262 Difficulty in walking, not elsewhere classified: Secondary | ICD-10-CM

## 2017-11-10 DIAGNOSIS — M62830 Muscle spasm of back: Secondary | ICD-10-CM | POA: Diagnosis not present

## 2017-11-10 DIAGNOSIS — G8929 Other chronic pain: Secondary | ICD-10-CM

## 2017-11-10 DIAGNOSIS — M6281 Muscle weakness (generalized): Secondary | ICD-10-CM

## 2017-11-10 NOTE — Therapy (Addendum)
[place], [place], [address] Phone: [phone]   Fax:  [phone]  Physical Therapy Evaluation  Patient Details  Name: Alyssa Jefferson MRN: [id number] Date of Birth: 1946/01/28 Referring Provider: Delia Chimes   Encounter Date: 11/10/2017  PT End of Session - 11/10/17 0950    Visit Number  1    Number of Visits  16    Date for PT Re-Evaluation  01/05/18    Authorization Type  [employer] MCR $40.00 Co-Pay    PT Start Time  6387    PT Stop Time  0935    PT Time Calculation (min)  48 min    Activity Tolerance  Patient tolerated treatment well    Behavior During Therapy  [facility] for tasks assessed/performed       Past Medical History:  Diagnosis Date  . Arthritis    knees, back  . Cancer [facility]) 2004   breast  . Depression   . GERD (gastroesophageal reflux disease)   . Glaucoma   . Heart palpitations   . High cholesterol   . History of transfusion    many years ago  . Hypertension   . Hypothyroidism   . Itching in the vaginal area   . Nocturia   . OSA (obstructive sleep apnea)    tried CPAP- but unsuccessful  . Renal disorder   . Thyroid disease    thyroid removed    Past Surgical History:  Procedure Laterality Date  . ABDOMINAL HYSTERECTOMY  82   BSO  . BREAST SURGERY  2004   left breast removed due to cancer no chemo or radiation  . MASTECTOMY Left   . THYROIDECTOMY     due to knot  . TOTAL KNEE ARTHROPLASTY Left 07/28/2015   Procedure: LEFT TOTAL KNEE ARTHROPLASTY;  Surgeon: Gaynelle Arabian, MD;  Location: [facility];  Service: Orthopedics;  Laterality: Left;    There were no vitals filed for this visit.   Subjective Assessment - 11/10/17 0943    Subjective  Pt reports that she is having right side lower back pain which extends into her buttock. Pt denies numbness and tingling. Pt reports pain started 3 years ago when taking care of her sister but has increased over the last 3 weeks. Pt is  unable to walk for extended periods of time or ride her exercise bike due to pain. Pt reports pain worse in the bottock when bending over and returning to standing upright and when lifitng objects from the ground.    Limitations  Lifting;Walking    How long can you walk comfortably?  <30 mins     Patient Stated Goals  Exercise bike; walking without pain     Currently in Pain?  Yes    Pain Score  8     Pain Location  Back    Pain Orientation  Lower;Right    Pain Descriptors / Indicators  Aching    Pain Type  Chronic pain    Pain Radiating Towards  Right buttock     Pain Onset  More than a month ago    Pain Frequency  Intermittent    Aggravating Factors   bending over; squating; lifting objects from ground     Pain Relieving Factors  Warm shower         [facility] PT Assessment - 11/10/17 0001      Assessment   Medical Diagnosis  Low back pain     Referring Provider  Zoe Stallings    Onset Date/Surgical Date  11/10/20    Hand Dominance  Right    Prior Therapy  knee      Precautions   Precautions  None      Restrictions   Weight Bearing Restrictions  No      Balance Screen   Has the patient fallen in the past 6 months  No    Has the patient had a decrease in activity level because of a fear of falling?   No    Is the patient reluctant to leave their home because of a fear of falling?   No      Home Environment   Living Environment  Private residence    Living Arrangements  Alone    Type of [place]  Two level    Additional Comments  16 stairs; pain going up and down stairs      Prior Function   Level of Independence  Independent    Vocation  Retired      Associate Professor   Overall Cognitive Status  Within Functional Limits for tasks assessed    Attention  Focused    Focused Attention  Appears intact    Memory  Appears intact    Awareness  Appears intact    Problem Solving  Appears intact      AROM   AROM Assessment Site  Lumbar     Lumbar Flexion  70 degrees pain coming up    Lumbar Extension  15 degrees  pain at end range     Lumbar - Right Side Bend  painful       PROM   PROM Assessment Site  Knee;Hip      Strength   Right Hip Flexion  4/5    Right Hip ABduction  5/5    Right Hip ADduction  5/5    Left Hip Flexion  4+/5    Left Hip ABduction  5/5    Left Hip ADduction  5/5    Right/Left Knee  Right;Left    Right Knee Flexion  5/5    Right Knee Extension  5/5    Left Knee Flexion  4+/5    Left Knee Extension  4+/5      Right Hip   Right Hip Flexion  90 limited by pain       Palpation   Palpation comment  muscle spasming in her right glute med and lumbar paraspinals      Special Tests   Other special tests  apperent right leg length descrepency.               No data recorded  Objective measurements completed on examination: See above findings.      [place] Adult PT Treatment/Exercise - 11/10/17 0001      Ambulation/Gait   Gait Comments  Pt shifting to the right side; decreased hip mobility; added heel wedge. Improved movement but still lateral movement with gait       Lumbar Exercises: Stretches   Lower Trunk Rotation Limitations  x10     Other Lumbar Stretch Exercise  2x20 sec hold       Lumbar Exercises: Standing   Other Standing Lumbar Exercises  tennis ball tirgger point release       Lumbar Exercises: Supine   AB Set Limitations  with moderate cuing  x10  PT Education - 11/10/17 0949    Education provided  Yes    Education Details  Exercise technique; HEP     Person(s) Educated  Patient    Methods  Explanation;Demonstration;Tactile cues    Comprehension  Verbalized understanding;Returned demonstration;Need further instruction       PT Short Term Goals - 11/10/17 1339      PT SHORT TERM GOAL #1   Title  Pt will demonstrate bilateral gross hip strength to a 5/5.    Time  3    Period  Weeks    Status  New    Target Date  12/01/17      PT SHORT  TERM GOAL #2   Title  Patient will report no tenderness to palpation to the R lumbar paraspinals or R upper gluteals.     Time  3    Period  Weeks    Status  New    Target Date  12/01/17      PT SHORT TERM GOAL #3   Title  Patient will increase R side passive hip flexion by 25% with no pain.     Time  3    Period  Weeks    Status  New    Target Date  12/01/17      PT SHORT TERM GOAL #4   Title  Patient will demonstrate full lumbar motion without pain at end range.     Time  3    Period  Weeks    Status  New    Target Date  12/01/17        PT Long Term Goals - 11/10/17 1334      PT LONG TERM GOAL #1   Title  Patient will  report <2/10 pain in her right lower back and hip when ambulating longer than 30 mins.    Time  8    Period  Weeks    Status  New    Target Date  01/05/18      PT LONG TERM GOAL #2   Title  Patient will ride her exercise bike longer than 20 mins without an increase in pain in her right hip.     Time  8    Period  Weeks    Target Date  01/05/18      PT LONG TERM GOAL #3   Title  Patient will climb 10 stairs without an increase in pain in her lower back in order to move to the second level of her home.     Time  8    Period  Weeks    Status  New    Target Date  01/05/18             Plan - 11/10/17 1751    Clinical Impression Statement  Patient is a 72 year old female reporting to physical therapy with right side low back pain that extends into her buttock. Pt displays pain at end range lumbar flexion, extension, and right side bending. Pt has decreased right hip mobility with painful hip flexion over pressure. Pt also has decreased bilateral hip strength with flexion. Pt has a leg length discrepency with the right leg appearing shorter than the left. Skill palaption identified muscle spasming of the right lower back and upper gluteal muscle. Pt will benefit from skilled therapy in order to increase hip mobility and strength, decrease lower back  spasming, and decrease pain in order for patient to complete functional daily activites.  History and Personal Factors relevant to plan of care:  Hypertension, DVT, Knee OA, Cancer    Clinical Presentation  Stable    Clinical Decision Making  Low    Rehab Potential  Good    PT Duration  8 weeks    PT Treatment/Interventions  ADLs/Self Care Home Management;Electrical Stimulation;Cryotherapy;Iontophoresis 4mg /ml Dexamethasone;Moist Heat;Traction;Ultrasound;Gait training;Stair training;Therapeutic activities;Therapeutic exercise;Patient/family education;Orthotic Fit/Training;Passive range of motion;Dry needling;Taping;Vasopneumatic Device    PT Next Visit Plan  Hip ROM and strengthening, Trigger point release, Core stability exercises, orthotic fitting/review; consdier PPT; supine march; clamshell;  Posteriro hip mob to increase flexion; assess tolerance to heel lift. Add another lefel if tolerated. Waunita Schooner has the other level.     PT Home Exercise Plan  Piriformis stretch, Lumbar rocking, TrA breathing/Core stability; Tennis ball trigger point release     Consulted and Agree with Plan of Care  Patient       Patient will benefit from skilled therapeutic intervention in order to improve the following deficits and impairments:  Abnormal gait, Pain, Increased muscle spasms, Decreased range of motion, Decreased strength, Difficulty walking  Visit Diagnosis: Chronic right-sided low back pain with right-sided sciatica  Muscle weakness (generalized)  Muscle spasm of back  Difficulty in walking, not elsewhere classified     Problem List Patient Active Problem List   Diagnosis Date Noted  . CKD (chronic kidney disease), stage III ([place]) 09/12/2017  . Secondary hyperparathyroidism ([place]) 09/12/2017  . OA (osteoarthritis) of knee 07/28/2015  . Palpitations 10/17/2014  . OSA (obstructive sleep apnea) 10/16/2012  . BMI 40.0-44.9, adult ([place]) 12/20/2011  . Hypothyroid 12/20/2011  . 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 Osteoarthritis, knee 12/20/2011  . Other and unspecified hyperlipidemia 12/20/2011  . Depressive disorder, not elsewhere classified 12/20/2011  . Unspecified essential hypertension 12/20/2011  . Anemia 12/20/2011  . Cancer [facility])     Carney Living 11/10/2017, 2:58 PM  Cooper Render SPT 3/28/ 20219   During this treatment session, the therapist was present, participating in and directing the treatment.   [place], [place], [address] Phone: [phone]   Fax:  [phone]  Name: Alyssa Jefferson MRN: [id number] Date of Birth: November 30, 1945

## 2017-11-15 ENCOUNTER — Encounter: Payer: Self-pay | Admitting: Physician Assistant

## 2017-11-16 ENCOUNTER — Encounter: Payer: Self-pay | Admitting: Physical Therapy

## 2017-11-16 ENCOUNTER — Ambulatory Visit: Payer: Medicare Other | Attending: Family Medicine | Admitting: Physical Therapy

## 2017-11-16 DIAGNOSIS — M6281 Muscle weakness (generalized): Secondary | ICD-10-CM | POA: Diagnosis not present

## 2017-11-16 DIAGNOSIS — G8929 Other chronic pain: Secondary | ICD-10-CM | POA: Diagnosis not present

## 2017-11-16 DIAGNOSIS — R262 Difficulty in walking, not elsewhere classified: Secondary | ICD-10-CM

## 2017-11-16 DIAGNOSIS — M5441 Lumbago with sciatica, right side: Secondary | ICD-10-CM | POA: Insufficient documentation

## 2017-11-16 DIAGNOSIS — M62830 Muscle spasm of back: Secondary | ICD-10-CM | POA: Diagnosis not present

## 2017-11-16 NOTE — Therapy (Signed)
[place], [place], [address] Phone: [phone]   Fax:  [phone]  Physical Therapy Treatment  Patient Details  Name: Alyssa Jefferson MRN: [id number] Date of Birth: [birth year] Referring Provider: Delia Chimes   Encounter Date: 11/16/2017  PT End of Session - 11/16/17 1028    Visit Number  2    Number of Visits  16    Date for PT Re-Evaluation  01/05/18    Authorization Type  [employer] MCR $40.00 Co-Pay    PT Start Time  1017    PT Stop Time  1108    PT Time Calculation (min)  51 min    Activity Tolerance  Patient tolerated treatment well    Behavior During Therapy  [facility] for tasks assessed/performed       Past Medical History:  Diagnosis Date  . Arthritis    knees, back  . Cancer [facility]) 2004   breast  . Depression   . GERD (gastroesophageal reflux disease)   . Glaucoma   . Heart palpitations   . High cholesterol   . History of transfusion    many years ago  . Hypertension   . Hypothyroidism   . Itching in the vaginal area   . Nocturia   . OSA (obstructive sleep apnea)    tried CPAP- but unsuccessful  . Renal disorder   . Thyroid disease    thyroid removed    Past Surgical History:  Procedure Laterality Date  . ABDOMINAL HYSTERECTOMY  82   BSO  . BREAST SURGERY  2004   left breast removed due to cancer no chemo or radiation  . MASTECTOMY Left   . THYROIDECTOMY     due to knot  . TOTAL KNEE ARTHROPLASTY Left 07/28/2015   Procedure: LEFT TOTAL KNEE ARTHROPLASTY;  Surgeon: Gaynelle Arabian, MD;  [facility];  Service: Orthopedics;  Laterality: Left;    There were no vitals filed for this visit.  Subjective Assessment - 11/16/17 1025    Subjective  Pt arriving to therpay reporting doing her HEP issued at initial evaluation. Pt reporting 3/10 low back pain radiating down R buttock.     Limitations  Lifting;Walking    Patient Stated Goals  Exercise bike; walking without pain     Currently in Pain?  Yes    Pain Score  3     Pain Location  Back    Pain Orientation  Right;Lower    Pain Type  Chronic pain    Pain Onset  More than a month ago    Pain Frequency  Intermittent    Multiple Pain Sites  No                       [facility] Adult PT Treatment/Exercise - 11/16/17 0001      Lumbar Exercises: Stretches   Lower Trunk Rotation Limitations  x10     Other Lumbar Stretch Exercise  2x20 sec hold       Lumbar Exercises: Standing   Heel Raises  10 reps;2 seconds    Other Standing Lumbar Exercises  Hip abd, hip flexion, hip extension, and toe raises all x 10 2 sets      Lumbar Exercises: Supine   AB Set Limitations  with moderate cuing  x10     Pelvic Tilt  3 reps moderate cuing required    Glut Set  10 reps;5 seconds    Clam  10 reps;3 seconds  Straight Leg Raise  10 reps;2 seconds 2 sets             PT Education - 11/16/17 1027    Education provided  Yes    Education Details  Exercises techniques    Person(s) Educated  Patient    Methods  Explanation;Demonstration    Comprehension  Verbalized understanding       PT Short Term Goals - 11/16/17 1210      PT SHORT TERM GOAL #1   Title  Pt will demonstrate bilateral gross hip strength to a 5/5.    Status  Unable to assess      PT SHORT TERM GOAL #2   Title  Patient will report no tenderness to palpation to the R lumbar paraspinals or R upper gluteals.     Status  Unable to assess      PT SHORT TERM GOAL #4   Title  Patient will demonstrate full lumbar motion without pain at end range.     Time  3    Period  Weeks    Status  On-going        PT Long Term Goals - 11/10/17 1334      PT LONG TERM GOAL #1   Title  Patient will  report <2/10 pain in her right lower back and hip when ambulating longer than 30 mins.    Time  8    Period  Weeks    Status  New    Target Date  01/05/18      PT LONG TERM GOAL #2   Title  Patient will ride her exercise bike longer than 20 mins  without an increase in pain in her right hip.     Time  8    Period  Weeks    Target Date  01/05/18      PT LONG TERM GOAL #3   Title  Patient will climb 10 stairs without an increase in pain in her lower back in order to move to the second level of her home.     Time  8    Period  Weeks    Status  New    Target Date  01/05/18            Plan - 11/16/17 1029    Clinical Impression Statement  Pt tolerated all exercises well today limited by fatigue and required rest breaks between exercisese. Pt reporting as she started exercising her pain decreased to 0/10. Pt still complaining of increased pain with bending and flexion. Reviewed verbally pt's HEP. Continue skilled PT as pt tolerates.   (Pended)     Rehab Potential  Good  (Pended)     PT Duration  8 weeks  (Pended)     PT Treatment/Interventions  ADLs/Self Care Home Management;Electrical Stimulation;Cryotherapy;Iontophoresis 4mg /ml Dexamethasone;Moist Heat;Traction;Ultrasound;Gait training;Stair training;Therapeutic activities;Therapeutic exercise;Patient/family education;Orthotic Fit/Training;Passive range of motion;Dry needling;Taping;Vasopneumatic Device  (Pended)     PT Next Visit Plan  Hip ROM and strengthening, Trigger point release, Core stability exercises, orthotic fitting/review; consdier PPT; supine march; clamshell;  Posteriro hip mob to increase flexion; assess tolerance to heel lift. Add another lefel if tolerated. Waunita Schooner has the other level.   (Pended)     PT Home Exercise Plan  Piriformis stretch, Lumbar rocking, TrA breathing/Core stability; Tennis ball trigger point release   (Pended)        Patient will benefit from skilled therapeutic intervention in order to improve the following deficits and impairments:  (P)  Abnormal gait, Pain, Increased muscle spasms, Decreased range of motion, Decreased strength, Difficulty walking  Visit Diagnosis: Chronic right-sided low back pain with right-sided sciatica  Muscle weakness  (generalized)  Muscle spasm of back  Difficulty in walking, not elsewhere classified     Problem List Patient Active Problem List   Diagnosis Date Noted  . CKD (chronic kidney disease), stage III ([place]) 09/12/2017  . Secondary hyperparathyroidism ([place]) 09/12/2017  . OA (osteoarthritis) of knee 07/28/2015  . Palpitations 10/17/2014  . OSA (obstructive sleep apnea) 10/16/2012  . BMI 40.0-44.9, adult ([place]) 12/20/2011  . Hypothyroid 12/20/2011  . 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 Osteoarthritis, knee 12/20/2011  . Other and unspecified hyperlipidemia 12/20/2011  . Depressive disorder, not elsewhere classified 12/20/2011  . Unspecified essential hypertension 12/20/2011  . Anemia 12/20/2011  . Cancer [facility])     Oretha Caprice, MPT 11/16/2017, 12:23 PM  [facility] [address] [place], [place], [address] Phone: [phone]   Fax:  [phone]  Name: MAKYNLEE KRESSIN MRN: [id number] Date of Birth: 04-27-1946

## 2017-11-23 ENCOUNTER — Ambulatory Visit: Payer: Medicare Other | Admitting: Physical Therapy

## 2017-11-23 ENCOUNTER — Encounter: Payer: Self-pay | Admitting: Physical Therapy

## 2017-11-23 DIAGNOSIS — M5441 Lumbago with sciatica, right side: Secondary | ICD-10-CM | POA: Diagnosis not present

## 2017-11-23 DIAGNOSIS — G8929 Other chronic pain: Secondary | ICD-10-CM

## 2017-11-23 DIAGNOSIS — M6281 Muscle weakness (generalized): Secondary | ICD-10-CM

## 2017-11-23 DIAGNOSIS — M62830 Muscle spasm of back: Secondary | ICD-10-CM | POA: Diagnosis not present

## 2017-11-23 DIAGNOSIS — R262 Difficulty in walking, not elsewhere classified: Secondary | ICD-10-CM | POA: Diagnosis not present

## 2017-11-23 NOTE — Therapy (Addendum)
[place], [place], [address] Phone: [phone]   Fax:  [phone]  Physical Therapy Treatment  Patient Details  Name: Alyssa Jefferson MRN: [id number] Date of Birth: 08-04-1946 Referring Provider: Delia Chimes   Encounter Date: 11/23/2017  PT End of Session - 11/23/17 1019    Visit Number  3    Number of Visits  16    Date for PT Re-Evaluation  01/05/18    Authorization Type  [employer] MCR $40.00 Co-Pay    PT Start Time  1016    PT Stop Time  1105    PT Time Calculation (min)  49 min    Activity Tolerance  Patient tolerated treatment well    Behavior During Therapy  [facility] for tasks assessed/performed       Past Medical History:  Diagnosis Date  . Arthritis    knees, back  . Cancer [facility]) 2004   breast  . Depression   . GERD (gastroesophageal reflux disease)   . Glaucoma   . Heart palpitations   . High cholesterol   . History of transfusion    many years ago  . Hypertension   . Hypothyroidism   . Itching in the vaginal area   . Nocturia   . OSA (obstructive sleep apnea)    tried CPAP- but unsuccessful  . Renal disorder   . Thyroid disease    thyroid removed    Past Surgical History:  Procedure Laterality Date  . ABDOMINAL HYSTERECTOMY  82   BSO  . BREAST SURGERY  2004   left breast removed due to cancer no chemo or radiation  . MASTECTOMY Left   . THYROIDECTOMY     due to knot  . TOTAL KNEE ARTHROPLASTY Left 07/28/2015   Procedure: LEFT TOTAL KNEE ARTHROPLASTY;  Surgeon: Gaynelle Arabian, MD;  [facility];  Service: Orthopedics;  Laterality: Left;    There were no vitals filed for this visit.  Subjective Assessment - 11/23/17 1017    Subjective  Patient reports that she isnt having any back pain today. Patient states that she notices her back pain after standing for long periords of time. Patient continues to use her HEP.     Limitations  Lifting;Walking    How long can you walk  comfortably?  <30 mins     Patient Stated Goals  Exercise bike; walking without pain                        [facility] Adult PT Treatment/Exercise - 11/23/17 0001      Lumbar Exercises: Stretches   Active Hamstring Stretch  2 reps;20 seconds    Lower Trunk Rotation Limitations  2x10    Piriformis Stretch  2 reps;20 seconds      Lumbar Exercises: Aerobic   Nustep  L3 x 5 mins       Lumbar Exercises: Standing   Heel Raises  --    Other Standing Lumbar Exercises  Hip abd, hip flexion, hip extension, and toe raises all x 10 2 sets      Lumbar Exercises: Supine   Clam  10 reps;3 seconds;Limitations    Clam Limitations  yellow band; added to HEP    Other Supine Lumbar Exercises  March with TrA activation 2x10      Modalities   Modalities  Moist Heat      Moist Heat Therapy   Number Minutes Moist Heat  10 Minutes  Moist Heat Location  Lumbar Spine             PT Education - 11/23/17 1309    Education provided  Yes    Education Details  Reviewed new exercises that were added to HEP    Person(s) Educated  Patient    Methods  Explanation;Demonstration;Tactile cues;Verbal cues;Handout    Comprehension  Verbalized understanding;Returned demonstration       PT Short Term Goals - 11/16/17 1210      PT SHORT TERM GOAL #1   Title  Pt will demonstrate bilateral gross hip strength to a 5/5.    Status  Unable to assess      PT SHORT TERM GOAL #2   Title  Patient will report no tenderness to palpation to the R lumbar paraspinals or R upper gluteals.     Status  Unable to assess      PT SHORT TERM GOAL #4   Title  Patient will demonstrate full lumbar motion without pain at end range.     Time  3    Period  Weeks    Status  On-going        PT Long Term Goals - 11/10/17 1334      PT LONG TERM GOAL #1   Title  Patient will  report <2/10 pain in her right lower back and hip when ambulating longer than 30 mins.    Time  8    Period  Weeks    Status  New     Target Date  01/05/18      PT LONG TERM GOAL #2   Title  Patient will ride her exercise bike longer than 20 mins without an increase in pain in her right hip.     Time  8    Period  Weeks    Target Date  01/05/18      PT LONG TERM GOAL #3   Title  Patient will climb 10 stairs without an increase in pain in her lower back in order to move to the second level of her home.     Time  8    Period  Weeks    Status  New    Target Date  01/05/18            Plan - 11/23/17 1311    Clinical Impression Statement  Therapy reviewed the importance of core and hip strengthening in order for the patient to stand longer without an increase in her back pain. Therapy added TrA marches in supine and supine hip abduction with theraband to patients HEP. Patient tolerated all exercises well today and reported no increase in pain after the therapy session. Therapy will continue with hip strengthen with a goal to increase patients endurance in order to stand longer without an increase in lower back pain.     History and Personal Factors relevant to plan of care:  Hypertension, DVT, Knee OA, Cancer    Clinical Presentation  Stable    Clinical Decision Making  Low    Rehab Potential  Good    PT Duration  8 weeks    PT Treatment/Interventions  ADLs/Self Care Home Management;Electrical Stimulation;Cryotherapy;Iontophoresis 4mg /ml Dexamethasone;Moist Heat;Traction;Ultrasound;Gait training;Stair training;Therapeutic activities;Therapeutic exercise;Patient/family education;Orthotic Fit/Training;Passive range of motion;Dry needling;Taping;Vasopneumatic Device    PT Home Exercise Plan  Piriformis stretch, Lumbar rocking, TrA breathing/Core stability; Tennis ball trigger point release     Consulted and Agree with Plan of Care  Patient  Patient will benefit from skilled therapeutic intervention in order to improve the following deficits and impairments:  Abnormal gait, Pain, Increased muscle spasms, Decreased  range of motion, Decreased strength, Difficulty walking  Visit Diagnosis: Chronic right-sided low back pain with right-sided sciatica  Muscle weakness (generalized)  Muscle spasm of back  Difficulty in walking, not elsewhere classified     Problem List Patient Active Problem List   Diagnosis Date Noted  . CKD (chronic kidney disease), stage III ([place]) 09/12/2017  . Secondary hyperparathyroidism ([place]) 09/12/2017  . OA (osteoarthritis) of knee 07/28/2015  . Palpitations 10/17/2014  . OSA (obstructive sleep apnea) 10/16/2012  . BMI 40.0-44.9, adult ([place]) 12/20/2011  . Hypothyroid 12/20/2011  . 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 Osteoarthritis, knee 12/20/2011  . Other and unspecified hyperlipidemia 12/20/2011  . Depressive disorder, not elsewhere classified 12/20/2011  . Unspecified essential hypertension 12/20/2011  . Anemia 12/20/2011  . Cancer [facility])    Carolyne Littles PT DPT  11/23/2017 1:17PM   Cooper Render SPT  11/23/2017, 1:17 PM   During this treatment session, the therapist was present, participating in and directing the treatment.  [place], [place], [address] Phone: [phone]   Fax:  [phone]  Name: Alyssa Jefferson MRN: [id number] Date of Birth: 11-16-1945

## 2017-11-30 ENCOUNTER — Ambulatory Visit: Payer: Medicare Other | Admitting: Physical Therapy

## 2017-12-08 ENCOUNTER — Encounter: Payer: Self-pay | Admitting: Family Medicine

## 2017-12-08 ENCOUNTER — Ambulatory Visit (INDEPENDENT_AMBULATORY_CARE_PROVIDER_SITE_OTHER): Payer: Medicare Other | Admitting: Family Medicine

## 2017-12-08 ENCOUNTER — Other Ambulatory Visit: Payer: Self-pay

## 2017-12-08 ENCOUNTER — Ambulatory Visit (INDEPENDENT_AMBULATORY_CARE_PROVIDER_SITE_OTHER): Payer: Medicare Other

## 2017-12-08 VITALS — BP 133/71 | HR 71 | Temp 98.3°F | Resp 17 | Ht 62.0 in | Wt 229.8 lb

## 2017-12-08 DIAGNOSIS — M25512 Pain in left shoulder: Secondary | ICD-10-CM

## 2017-12-08 DIAGNOSIS — R0781 Pleurodynia: Secondary | ICD-10-CM

## 2017-12-08 DIAGNOSIS — M19012 Primary osteoarthritis, left shoulder: Secondary | ICD-10-CM | POA: Diagnosis not present

## 2017-12-08 NOTE — Progress Notes (Signed)
Chief Complaint  Patient presents with  . left shoulder pain    pain under left shoulder blade x 2 weeks, taking tylenol for pain but no relief, pain level 10/10.  Pain when sitting upward but no pain when bending, pt c/o knot under left breast when bending and cramping in legs    HPI  Left shoulder blade pain Patient reports that she that started having left shoulder pain localized below the shoulder blade 2 weeks ago that was unprovoked She reports that it feels like it is a sharp pain that sometimes feels like a knot She states that when she sits still it does not hurt but certain movements the pain escalates to 10/10 She has not left hand weakness Pain does not radiate Her strength is normal.   Rib pain She also has a new pain underneath her right rib cage that is also sore  She feels like it is a knot or an area of fat over the rib She states that the knot is only noticeable if she takes a deep breath and rubs her hand over it.  She denies falls, trauma or bruising of the overlying skin. Does not affect her breathing    She states that she was going to physical therapy for her acute low back and is still going on Wednesdays She states that she also developed some muscle cramps      Past Medical History:  Diagnosis Date  . Arthritis    knees, back  . Cancer [facility]) 2004   breast  . Depression   . GERD (gastroesophageal reflux disease)   . Glaucoma   . Heart palpitations   . High cholesterol   . History of transfusion    many years ago  . Hypertension   . Hypothyroidism   . Itching in the vaginal area   . Nocturia   . OSA (obstructive sleep apnea)    tried CPAP- but unsuccessful  . Renal disorder   . Thyroid disease    thyroid removed    Current Outpatient Medications  Medication Sig Dispense Refill  . cetirizine (ZYRTEC) 10 MG tablet Take 1 tablet (10 mg total) by mouth daily. 30 tablet 11  . diltiazem (CARDIZEM CD) 120 MG 24 hr capsule Take 1 capsule (120  mg total) by mouth daily. 90 capsule 1  . latanoprost (XALATAN) 0.005 % ophthalmic solution     . levothyroxine (SYNTHROID, LEVOTHROID) 50 MCG tablet Take 1 tablet (50 mcg total) by mouth daily. 90 tablet 1  . rosuvastatin (CRESTOR) 10 MG tablet Take 1 tablet (10 mg total) by mouth at bedtime. 90 tablet 1  . spironolactone (ALDACTONE) 50 MG tablet take 1 tablet by mouth once daily 90 tablet 1   No current facility-administered medications for this visit.     Allergies:  Allergies  Allergen Reactions  . Codeine Other (See Comments)    confusion  . Lipitor [Atorvastatin Calcium] Itching    Past Surgical History:  Procedure Laterality Date  . ABDOMINAL HYSTERECTOMY  83   BSO  . BREAST SURGERY  2004   left breast removed due to cancer no chemo or radiation  . MASTECTOMY Left   . THYROIDECTOMY     due to knot  . 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 Marital status: Widowed  Spouse name: Not on file  . Number of children: Not on file  . Years of education: Not on file  . Highest education level: Not on file  Occupational History  . Occupation: retired    Fish farm manager: RETIRED  Social Needs  . Financial resource strain: Not on file  . Food insecurity:    Worry: Not on file    Inability: Not on file  . Transportation needs:    Medical: Not on file    Non-medical: Not on file  Tobacco Use  . Smoking status: Former Smoker    Years: 20.00    Types: Cigarettes    Last attempt to quit: 08/16/1978    Years since quitting: 39.3  . Smokeless tobacco: Never Used  . Tobacco comment: 1 pack per week--10/16/12  Substance and Sexual Activity  . Alcohol use: No  . Drug use: No  . Sexual activity: Never  Lifestyle  . Physical activity:    Days per week: Not on file    Minutes per session: Not on file  .  Stress: Not on file  Relationships  . Social connections:    Talks on phone: Not on file    Gets together: Not on file    Attends religious service: Not on file    Active member of club or organization: Not on file    Attends meetings of clubs or organizations: Not on file    Relationship status: Not on file  Other Topics Concern  . Not on file  Social History Narrative  . Not on file    Family History  Problem Relation Age of Onset  . Lung cancer Father   . Breast cancer Sister   . Ovarian cancer Sister   . Colon cancer Neg Hx   . Esophageal cancer Neg Hx   . Stomach cancer Neg Hx   . Pancreatic cancer Neg Hx   . Liver disease Neg Hx      ROS Review of Systems See HPI Constitution: No fevers or chills No malaise No diaphoresis Skin: No rash or itching Eyes: no blurry vision, no double vision GU: no dysuria or hematuria Neuro: no dizziness or headaches all others reviewed and negative   Objective: Vitals:   12/08/17 0820  BP: 133/71  Pulse: 71  Resp: 17  Temp: 98.3 F (36.8 C)  TempSrc: Oral  SpO2: 98%  Weight: 229 lb 12.8 oz (104.2 kg)  Height: 5\' 2"  (1.575 m)    Physical Exam  Constitutional: She is oriented to person, place, and time. She appears well-developed and well-nourished.  HENT:  Head: Normocephalic and atraumatic.  Eyes: Conjunctivae and EOM are normal.  Cardiovascular: Normal rate, regular rhythm and normal heart sounds.  No murmur heard. Pulmonary/Chest: Effort normal and breath sounds normal. No stridor. No respiratory distress. She has no wheezes.    Musculoskeletal:       Back:  Neurological: She is alert and oriented to person, place, and time.  Psychiatric: She has a normal mood and affect. Her behavior is normal. Judgment and thought content normal.    CLINICAL DATA:  Acute left shoulder pain  EXAM: LEFT SHOULDER - 2+ VIEW  COMPARISON:  Chest x-ray 12/08/2017  FINDINGS: The left humeral head is in normal position  and the glenohumeral joint space is unremarkable. Minimal bony spurring is noted from the inferior glenoid rim. There is mild degenerative change of the left AC joint. No acute abnormality is seen. Surgical clips overlie the left axilla.  IMPRESSION: Only mild degenerative joint disease of the left shoulder. No acute abnormality.   Electronically Signed   By: Ivar Drape M.D.   On: 12/08/2017 09:27  CLINICAL DATA:  Right rib pain  EXAM: BILATERAL RIBS AND CHEST - 4+ VIEW  COMPARISON:  09/19/2014  FINDINGS: No visible rib fracture. Lungs are clear. Heart is normal size. No effusions or pneumothorax.  IMPRESSION: No visible rib fracture.  No acute cardiopulmonary disease.   Electronically Signed   By: Rolm Baptise M.D.   On: 12/08/2017 09:27    Assessment and Plan Taniqua was seen today for left shoulder pain.  Diagnoses and all orders for this visit:  Acute pain of left shoulder- no acute fracture, arthritis noted on xray Advised to add on shoulder to her PT routine -     DG Shoulder Left; Future -     Ambulatory referral to Physical Therapy  Rib pain on right side- pt to add on some PT to her routine -     DG Ribs Bilateral W/Chest; Future -     Ambulatory referral to Physical Therapy     Alyssa Jefferson

## 2017-12-08 NOTE — Patient Instructions (Addendum)
     IF you received an x-ray today, you will receive an invoice from [facility]. Please contact [facility] at [phone] with questions or concerns regarding your invoice.   IF you received labwork today, you will receive an invoice from [facility]. Please contact [facility] at [phone] with questions or concerns regarding your invoice.   Our billing staff will not be able to assist you with questions regarding bills from these companies.  You will be contacted with the lab results as soon as they are available. The fastest way to get your results is to activate your My Chart account. Instructions are located on the last page of this paperwork. If you have not heard from us regarding the results in 2 weeks, please contact this office.     Shoulder Pain Many things can cause shoulder pain, including:  An injury.  Moving the arm in the same way again and again (overuse).  Joint pain (arthritis).  Follow these instructions at home: Take these actions to help with your pain:  Squeeze a soft ball or a foam pad as much as you can. This helps to prevent swelling. It also makes the arm stronger.  Take over-the-counter and prescription medicines only as told by your doctor.  If told, put ice on the area: ? Put ice in a plastic bag. ? Place a towel between your skin and the bag. ? Leave the ice on for 20 minutes, 2-3 times per day. Stop putting on ice if it does not help with the pain.  If you were given a shoulder sling or immobilizer: ? Wear it as told. ? Remove it to shower or bathe. ? Move your arm as little as possible. ? Keep your hand moving. This helps prevent swelling.  Contact a doctor if:  Your pain gets worse.  Medicine does not help your pain.  You have new pain in your arm, hand, or fingers. Get help right away if:  Your arm, hand, or fingers: ? Tingle. ? Are numb. ? Are swollen. ? Are painful. ? Turn white or blue. This information is  not intended to replace advice given to you by your health care provider. Make sure you discuss any questions you have with your health care provider. Document Released: 01/19/2008 Document Revised: 03/28/2016 Document Reviewed: 11/25/2014 Elsevier Interactive Patient Education  2018 Elsevier Inc.  

## 2017-12-10 ENCOUNTER — Other Ambulatory Visit: Payer: Self-pay | Admitting: Family Medicine

## 2017-12-10 DIAGNOSIS — E0580 Other thyrotoxicosis without thyrotoxic crisis or storm: Secondary | ICD-10-CM

## 2017-12-13 ENCOUNTER — Other Ambulatory Visit: Payer: Self-pay | Admitting: Family Medicine

## 2017-12-13 NOTE — Telephone Encounter (Signed)
Aldactone 50 mg refill request  LOV 06/09/17 with Dr. Carlota Raspberry  Where this was addressed.  Needs appt.  [facility] [place], [place] [employer].

## 2017-12-14 ENCOUNTER — Ambulatory Visit: Payer: Medicare Other | Attending: Family Medicine | Admitting: Physical Therapy

## 2017-12-14 ENCOUNTER — Encounter: Payer: Self-pay | Admitting: Physical Therapy

## 2017-12-14 DIAGNOSIS — M6281 Muscle weakness (generalized): Secondary | ICD-10-CM | POA: Diagnosis not present

## 2017-12-14 DIAGNOSIS — G8929 Other chronic pain: Secondary | ICD-10-CM | POA: Diagnosis not present

## 2017-12-14 DIAGNOSIS — M5441 Lumbago with sciatica, right side: Secondary | ICD-10-CM | POA: Diagnosis not present

## 2017-12-14 DIAGNOSIS — M62830 Muscle spasm of back: Secondary | ICD-10-CM | POA: Diagnosis not present

## 2017-12-14 DIAGNOSIS — R262 Difficulty in walking, not elsewhere classified: Secondary | ICD-10-CM | POA: Diagnosis not present

## 2017-12-14 NOTE — Therapy (Signed)
[place], [place], [address] Phone: [phone]   Fax:  [phone]  Physical Therapy Treatment  Patient Details  Name: Alyssa Jefferson MRN: [id number] Date of Birth: 31-Dec-1945 Referring Provider: Delia Chimes   Encounter Date: 12/14/2017  PT End of Session - 12/14/17 1008    Visit Number  4    Number of Visits  16    Date for PT Re-Evaluation  01/05/18    Authorization Type  [employer] MCR $40.00 Co-Pay    PT Start Time  0930    PT Stop Time  1008    PT Time Calculation (min)  38 min    Activity Tolerance  Patient tolerated treatment well    Behavior During Therapy  [facility] for tasks assessed/performed       Past Medical History:  Diagnosis Date  . Arthritis    knees, back  . Cancer [facility]) 2004   breast  . Depression   . GERD (gastroesophageal reflux disease)   . Glaucoma   . Heart palpitations   . High cholesterol   . History of transfusion    many years ago  . Hypertension   . Hypothyroidism   . Itching in the vaginal area   . Nocturia   . OSA (obstructive sleep apnea)    tried CPAP- but unsuccessful  . Renal disorder   . Thyroid disease    thyroid removed    Past Surgical History:  Procedure Laterality Date  . ABDOMINAL HYSTERECTOMY  83   BSO  . BREAST SURGERY  2004   left breast removed due to cancer no chemo or radiation  . MASTECTOMY Left   . THYROIDECTOMY     due to knot  . TOTAL KNEE ARTHROPLASTY Left 07/28/2015   Procedure: LEFT TOTAL KNEE ARTHROPLASTY;  Surgeon: Gaynelle Arabian, MD;  Location: [facility];  Service: Orthopedics;  Laterality: Left;    There were no vitals filed for this visit.  Subjective Assessment - 12/14/17 0933    Subjective  I felt good after last session, the heat really seemed to help. Nothing else going on this morning.     Currently in Pain?  Yes    Pain Score  4     Pain Location  Back    Pain Orientation  Right;Lower    Pain Descriptors / Indicators   Discomfort    Pain Type  Chronic pain    Pain Radiating Towards  into R hip     Pain Onset  More than a month ago    Pain Frequency  Constant    Aggravating Factors   bending, straightening in standing     Pain Relieving Factors  heat, exercises     Effect of Pain on Daily Activities  moderate                        [facility] Adult PT Treatment/Exercise - 12/14/17 0001      Lumbar Exercises: Stretches   Active Hamstring Stretch  2 reps;30 seconds    Single Knee to Chest Stretch  5 reps;10 seconds    Lower Trunk Rotation  5 reps;10 seconds    Piriformis Stretch  2 reps;30 seconds    Other Lumbar Stretch Exercise  frog stretch 1x30 seconds      Lumbar Exercises: Standing   Other Standing Lumbar Exercises  hip hikes with and without swing 2x10 B       Lumbar Exercises:  Supine   Ab Set  20 reps;3 seconds    Clam  10 reps    Clam Limitations  yellow band     Bent Knee Raise  10 reps;Other (comment) yellow band     Bridge with clamshell  15 reps;Other (comment) yellow TB     Straight Leg Raise  10 reps B, eccentric lower              PT Education - 12/14/17 1008    Education provided  Yes    Education Details  exercise form thorughout session     Person(s) Educated  Patient    Methods  Explanation    Comprehension  Need further instruction       PT Short Term Goals - 11/16/17 1210      PT SHORT TERM GOAL #1   Title  Pt will demonstrate bilateral gross hip strength to a 5/5.    Status  Unable to assess      PT SHORT TERM GOAL #2   Title  Patient will report no tenderness to palpation to the R lumbar paraspinals or R upper gluteals.     Status  Unable to assess      PT SHORT TERM GOAL #4   Title  Patient will demonstrate full lumbar motion without pain at end range.     Time  3    Period  Weeks    Status  On-going        PT Long Term Goals - 11/10/17 1334      PT LONG TERM GOAL #1   Title  Patient will  report <2/10 pain in her right lower back  and hip when ambulating longer than 30 mins.    Time  8    Period  Weeks    Status  New    Target Date  01/05/18      PT LONG TERM GOAL #2   Title  Patient will ride her exercise bike longer than 20 mins without an increase in pain in her right hip.     Time  8    Period  Weeks    Target Date  01/05/18      PT LONG TERM GOAL #3   Title  Patient will climb 10 stairs without an increase in pain in her lower back in order to move to the second level of her home.     Time  8    Period  Weeks    Status  New    Target Date  01/05/18            Plan - 12/14/17 1009    Clinical Impression Statement  Continued with functional strengthening focus today, patient continues to demonstrate gross weakness and poor muscle coordination in core and hip musculature, cues provided as needed for correct exercise form. Patient tends to move very quickly and requires repetitive cues for slow, smooth coordinated movements; she is with complaints of muscle fatigue throughout session. She was placed on the heat pack at the end of session, not included in billing.     Rehab Potential  Good    PT Duration  8 weeks    PT Treatment/Interventions  ADLs/Self Care Home Management;Electrical Stimulation;Cryotherapy;Iontophoresis 4mg /ml Dexamethasone;Moist Heat;Traction;Ultrasound;Gait training;Stair training;Therapeutic activities;Therapeutic exercise;Patient/family education;Orthotic Fit/Training;Passive range of motion;Dry needling;Taping;Vasopneumatic Device    PT Next Visit Plan  Hip ROM and strengthening, Trigger point release, Core stability exercises, orthotic fitting/review; consdier PPT; supine march; clamshell;  Posteriro hip mob  to increase flexion; assess tolerance to heel lift. Add another lefel if tolerated. Waunita Schooner has the other level.     PT Home Exercise Plan  Piriformis stretch, Lumbar rocking, TrA breathing/Core stability; Tennis ball trigger point release     Consulted and Agree with Plan of Care   Patient       Patient will benefit from skilled therapeutic intervention in order to improve the following deficits and impairments:  Abnormal gait, Pain, Increased muscle spasms, Decreased range of motion, Decreased strength, Difficulty walking  Visit Diagnosis: Chronic right-sided low back pain with right-sided sciatica  Muscle weakness (generalized)  Muscle spasm of back  Difficulty in walking, not elsewhere classified     Problem List Patient Active Problem List   Diagnosis Date Noted  . CKD (chronic kidney disease), stage III ([place]) 09/12/2017  . Secondary hyperparathyroidism ([place]) 09/12/2017  . OA (osteoarthritis) of knee 07/28/2015  . Palpitations 10/17/2014  . OSA (obstructive sleep apnea) 10/16/2012  . BMI 40.0-44.9, adult ([place]) 12/20/2011  . Hypothyroid 12/20/2011  . 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 Osteoarthritis, knee 12/20/2011  . Other and unspecified hyperlipidemia 12/20/2011  . Depressive disorder, not elsewhere classified 12/20/2011  . Unspecified essential hypertension 12/20/2011  . Anemia 12/20/2011  . Cancer [facility])     Deniece Ree PT, DPT, [place]  Supplemental Physical Therapist [place]   Pager [facility] [address] [place], [place], [address] Phone: [phone]   Fax:  [phone]  Name: Alyssa Jefferson MRN: [id number] Date of Birth: September 26, 1945

## 2017-12-16 ENCOUNTER — Telehealth: Payer: Self-pay | Admitting: Family Medicine

## 2017-12-16 NOTE — Telephone Encounter (Signed)
Result note read to patient; verbalizes understanding. Result note was not routed to [facility]. 

## 2017-12-19 ENCOUNTER — Ambulatory Visit (INDEPENDENT_AMBULATORY_CARE_PROVIDER_SITE_OTHER): Payer: Medicare Other | Admitting: Family Medicine

## 2017-12-19 ENCOUNTER — Other Ambulatory Visit: Payer: Self-pay

## 2017-12-19 ENCOUNTER — Encounter: Payer: Self-pay | Admitting: Family Medicine

## 2017-12-19 VITALS — BP 137/72 | HR 74 | Temp 99.2°F | Resp 17 | Ht 62.0 in | Wt 227.8 lb

## 2017-12-19 DIAGNOSIS — K5901 Slow transit constipation: Secondary | ICD-10-CM

## 2017-12-19 DIAGNOSIS — K644 Residual hemorrhoidal skin tags: Secondary | ICD-10-CM | POA: Diagnosis not present

## 2017-12-19 DIAGNOSIS — I10 Essential (primary) hypertension: Secondary | ICD-10-CM | POA: Diagnosis not present

## 2017-12-19 DIAGNOSIS — K625 Hemorrhage of anus and rectum: Secondary | ICD-10-CM | POA: Diagnosis not present

## 2017-12-19 DIAGNOSIS — R1013 Epigastric pain: Secondary | ICD-10-CM | POA: Diagnosis not present

## 2017-12-19 MED ORDER — SPIRONOLACTONE 50 MG PO TABS: 50.0000 mg | ORAL_TABLET | Freq: Every day | ORAL | 1 refills | Status: DC

## 2017-12-19 NOTE — Patient Instructions (Addendum)
Take mineral oil as instructed on the bottle first thing in the morning 1 hour before eating for the next 5-7 days  Take metamucil as instructed on the container BEFORE breakfast and BEFORE dinner After drinking metamucil drink plenty of water   IF you received an x-ray today, you will receive an invoice from [facility]. Please contact [facility] at [phone] with questions or concerns regarding your invoice.   IF you received labwork today, you will receive an invoice from [facility]. Please contact [facility] at [phone] with questions or concerns regarding your invoice.   Our billing staff will not be able to assist you with questions regarding bills from these companies.  You will be contacted with the lab results as soon as they are available. The fastest way to get your results is to activate your My Chart account. Instructions are located on the last page of this paperwork. If you have not heard from [place] regarding the results in 2 weeks, please contact this office.     Constipation, Adult Constipation is when a person has fewer bowel movements in a week than normal, has difficulty having a bowel movement, or has stools that are dry, hard, or larger than normal. Constipation may be caused by an underlying condition. It may become worse with age if a person takes certain medicines and does not take in enough fluids. Follow these instructions at home: Eating and drinking   Eat foods that have a lot of fiber, such as fresh fruits and vegetables, whole grains, and beans.  Limit foods that are high in fat, low in fiber, or overly processed, such as french fries, hamburgers, cookies, candies, and soda.  Drink enough fluid to keep your urine clear or pale yellow. General instructions  Exercise regularly or as told by your health care provider.  Go to the restroom when you have the urge to go. Do not hold it in.  Take over-the-counter and prescription medicines  only as told by your health care provider. These include any fiber supplements.  Practice pelvic floor retraining exercises, such as deep breathing while relaxing the lower abdomen and pelvic floor relaxation during bowel movements.  Watch your condition for any changes.  Keep all follow-up visits as told by your health care provider. This is important. Contact a health care provider if:  You have pain that gets worse.  You have a fever.  You do not have a bowel movement after 4 days.  You vomit.  You are not hungry.  You lose weight.  You are bleeding from the anus.  You have thin, pencil-like stools. Get help right away if:  You have a fever and your symptoms suddenly get worse.  You leak stool or have blood in your stool.  Your abdomen is bloated.  You have severe pain in your abdomen.  You feel dizzy or you faint. This information is not intended to replace advice given to you by your health care provider. Make sure you discuss any questions you have with your health care provider. Document Released: 04/30/2004 Document Revised: 02/20/2016 Document Reviewed: 01/21/2016 Elsevier Interactive Patient Education  2018 [employer].

## 2017-12-19 NOTE — Progress Notes (Signed)
Chief Complaint  Patient presents with  . Abdominal Pain    mid abdomen and under sternum, bm's 2-3 times daily some are hard and sometimes loose.  Per pt she thinks it is a sore back there causing the bleeding but was concerned after seeing blood twice  . Rectal Bleeding  . Medication Refill    spironolactone    HPI  Constipation and Blood per rectum Patient reports that she has been having constipation and a sensation that she is not fully empty after BM She reports that she has to go to the toilet 3-4 times daily for BM and only small bits of stool comes out She reports that her abdominal pain is in the epigastrium and is associated with bloating She reports that she has not seen any blood mixed in with the stool  Only when she wipes No unexplained weight loss No fatigue Colonoscopy in 2015 showed diverticula of the colon as well as hemorrhoids  Hypertension: Patient here for follow-up of elevated blood pressure. She is exercising and is adherent to low salt diet.  Blood pressure is well controlled at home. Cardiac symptoms none. Patient denies chest pain, chest pressure/discomfort, claudication, dyspnea, exertional chest pressure/discomfort, fatigue, irregular heart beat, lower extremity edema, near-syncope and orthopnea.  Cardiovascular risk factors: advanced age (older than 64 for men, 78 for women), hypertension and obesity (BMI >= 30 kg/m2). Use of agents associated with hypertension: none. History of target organ damage: none. BP Readings from Last 3 Encounters:  12/19/17 137/72  12/08/17 133/71  10/24/17 135/77     Past Medical History:  Diagnosis Date  . Arthritis    knees, back  . Cancer [facility]) 2004   breast  . Depression   . GERD (gastroesophageal reflux disease)   . Glaucoma   . Heart palpitations   . High cholesterol   . History of transfusion    many years ago  . Hypertension   . Hypothyroidism   . Itching in the vaginal area   . Nocturia   . OSA  (obstructive sleep apnea)    tried CPAP- but unsuccessful  . Renal disorder   . Thyroid disease    thyroid removed    Current Outpatient Medications  Medication Sig Dispense Refill  . cetirizine (ZYRTEC) 10 MG tablet Take 1 tablet (10 mg total) by mouth daily. 30 tablet 11  . diltiazem (CARDIZEM CD) 120 MG 24 hr capsule Take 1 capsule (120 mg total) by mouth daily. 90 capsule 1  . latanoprost (XALATAN) 0.005 % ophthalmic solution     . levothyroxine (SYNTHROID, LEVOTHROID) 50 MCG tablet Take 1 tablet (50 mcg total) by mouth daily. OFFICE VISIT NEEDED 90 tablet 0  . rosuvastatin (CRESTOR) 10 MG tablet Take 1 tablet (10 mg total) by mouth at bedtime. 90 tablet 1  . spironolactone (ALDACTONE) 50 MG tablet Take 1 tablet (50 mg total) by mouth daily. 90 tablet 1   No current facility-administered medications for this visit.     Allergies:  Allergies  Allergen Reactions  . Codeine Other (See Comments)    confusion  . Lipitor [Atorvastatin Calcium] Itching    Past Surgical History:  Procedure Laterality Date  . ABDOMINAL HYSTERECTOMY  83   BSO  . BREAST SURGERY  2004   left breast removed due to cancer no chemo or radiation  . MASTECTOMY Left   . THYROIDECTOMY     due to knot  . TOTAL KNEE ARTHROPLASTY Left 07/28/2015   Procedure: LEFT TOTAL  KNEE ARTHROPLASTY;  Surgeon: Gaynelle Arabian, MD;  Location: [facility];  Service: Orthopedics;  Laterality: Left;    Social History   Socioeconomic History  . Marital status: Widowed    Spouse name: Not on file  . Number of children: Not on file  . Years of education: Not on file  . Highest education level: Not on file  Occupational History  . Occupation: retired    Fish farm manager: RETIRED  Social Needs  . Financial resource strain: Not on file  . Food insecurity:    Worry: Not on file    Inability: Not on file  . Transportation needs:    Medical: Not on file    Non-medical: Not on file  Tobacco Use  . Smoking status: Former Smoker     Years: 20.00    Types: Cigarettes    Last attempt to quit: 08/16/1978    Years since quitting: 39.3  . Smokeless tobacco: Never Used  . Tobacco comment: 1 pack per week--10/16/12  Substance and Sexual Activity  . Alcohol use: No  . Drug use: No  . Sexual activity: Never  Lifestyle  . Physical activity:    Days per week: Not on file    Minutes per session: Not on file  . Stress: Not on file  Relationships  . Social connections:    Talks on phone: Not on file    Gets together: Not on file    Attends religious service: Not on file    Active member of club or organization: Not on file    Attends meetings of clubs or organizations: Not on file    Relationship status: Not on file  Other Topics Concern  . Not on file  Social History Narrative  . Not on file    Family History  Problem Relation Age of Onset  . Lung cancer Father   . Breast cancer Sister   . Ovarian cancer Sister   . Colon cancer Neg Hx   . Esophageal cancer Neg Hx   . Stomach cancer Neg Hx   . Pancreatic cancer Neg Hx   . Liver disease Neg Hx      ROS Review of Systems See HPI Constitution: No fevers or chills No malaise No diaphoresis Skin: No rash or itching Eyes: no blurry vision, no double vision GU: no dysuria or hematuria Neuro: no dizziness or headaches all others reviewed and negative   Objective: Vitals:   12/19/17 1641  BP: 137/72  Pulse: 74  Resp: 17  Temp: 99.2 F (37.3 C)  TempSrc: Oral  SpO2: 97%  Weight: 227 lb 12.8 oz (103.3 kg)  Height: 5\' 2"  (1.575 m)    Physical Exam  Constitutional: She is oriented to person, place, and time. She appears well-developed and well-nourished.  HENT:  Head: Normocephalic and atraumatic.  Eyes: Pupils are equal, round, and reactive to light. EOM are normal.  Pulmonary/Chest: Effort normal.  Abdominal: Soft. Normal appearance and bowel sounds are normal. She exhibits distension. She exhibits no shifting dullness, no pulsatile liver, no fluid  wave, no abdominal bruit, no ascites, no pulsatile midline mass and no mass. There is no hepatosplenomegaly, splenomegaly or hepatomegaly. There is tenderness in the epigastric area. There is no rigidity, no rebound, no guarding, no CVA tenderness, no tenderness at McBurney's point and negative Murphy's sign. No hernia.  Mild epigastric tenderness  Neurological: She is alert and oriented to person, place, and time.  Skin: Skin is warm. Capillary refill takes less than 2  seconds.  Psychiatric: She has a normal mood and affect. Her behavior is normal.    Assessment and Plan Donicia was seen today for abdominal pain, rectal bleeding and medication refill.  Diagnoses and all orders for this visit:  Blood per rectum Abdominal pain, epigastric Slow transit constipation External hemorrhoid -     Ambulatory referral to Gastroenterology  Advised pt to follow up with GI to see if a repeat colonoscopy would be warranted At this point her symptoms and physical are suggestive of constipation and this is aggravating her hemorrhoids Gave a bowel regimen of fiber and mineral oil She should drink plenty of water  Essential hypertension Refilled blood pressure medication spironolactone -     spironolactone (ALDACTONE) 50 MG tablet; Take 1 tablet (50 mg total) by mouth daily.     [place]

## 2017-12-20 ENCOUNTER — Encounter: Payer: Self-pay | Admitting: Physical Therapy

## 2017-12-26 ENCOUNTER — Ambulatory Visit: Payer: Medicare Other | Admitting: Physical Therapy

## 2017-12-26 ENCOUNTER — Telehealth: Payer: Self-pay | Admitting: Physical Therapy

## 2017-12-26 NOTE — Telephone Encounter (Signed)
Called patient regarding no-show visit. Message left. Phone number left in case she cant make next visit.

## 2017-12-27 DIAGNOSIS — N183 Chronic kidney disease, stage 3 (moderate): Secondary | ICD-10-CM | POA: Diagnosis not present

## 2017-12-28 ENCOUNTER — Ambulatory Visit: Payer: Medicare Other | Admitting: Physical Therapy

## 2018-01-02 ENCOUNTER — Ambulatory Visit: Payer: Medicare Other | Admitting: Physical Therapy

## 2018-01-04 ENCOUNTER — Ambulatory Visit: Payer: Medicare Other | Admitting: Physical Therapy

## 2018-01-10 ENCOUNTER — Encounter: Payer: Self-pay | Admitting: Physical Therapy

## 2018-01-10 ENCOUNTER — Encounter: Payer: Self-pay | Admitting: Family Medicine

## 2018-01-12 ENCOUNTER — Encounter: Payer: Self-pay | Admitting: Physical Therapy

## 2018-01-16 ENCOUNTER — Encounter: Payer: Self-pay | Admitting: Physical Therapy

## 2018-01-18 ENCOUNTER — Telehealth: Payer: Self-pay | Admitting: Family Medicine

## 2018-01-18 ENCOUNTER — Encounter: Payer: Self-pay | Admitting: Physical Therapy

## 2018-01-18 DIAGNOSIS — Z5181 Encounter for therapeutic drug level monitoring: Secondary | ICD-10-CM

## 2018-01-18 DIAGNOSIS — E785 Hyperlipidemia, unspecified: Secondary | ICD-10-CM

## 2018-01-26 MED ORDER — ROSUVASTATIN CALCIUM 10 MG PO TABS: 10.0000 mg | ORAL_TABLET | Freq: Every day | ORAL | 0 refills | Status: DC

## 2018-01-26 NOTE — Telephone Encounter (Signed)
Medication likely denied due to hyperlipidemia not being addressed for more than a year - last discussed at 12/15/16 visit with Dr. Carlota Raspberry. 12/15/16 was last date cholesterol was checked as well.  Provider, if medication was addressed at last visit, please provide refills or recommend office visit.

## 2018-01-26 NOTE — Telephone Encounter (Signed)
I sent in 3 months' worth but we need to check her cholesterol levels.  She does not need to see me. She only needs to come in to get a cholesterol check one morning and I can send in a year's worth of her meds.  Thank you.

## 2018-01-26 NOTE — Telephone Encounter (Signed)
Pt states she saw Dr Bridget Hartshorn on 12/19/2017 and went over her meds.  Pt would liek to know why her rosuvastatin (CRESTOR) 10 MG tablet Was refused. Please call back.  [facility] [place] [address] [facility] [address] [phone] (Phone) [phone] (Fax)

## 2018-01-26 NOTE — Addendum Note (Signed)
Addended by: Delia Chimes A on: 01/26/2018 03:00 PM   Modules accepted: Orders

## 2018-01-26 NOTE — Telephone Encounter (Signed)
Phone call to patient. Relayed message to patient. She verbalizes understanding. Will come to get blood drawn.

## 2018-01-30 ENCOUNTER — Ambulatory Visit (INDEPENDENT_AMBULATORY_CARE_PROVIDER_SITE_OTHER): Payer: Medicare Other | Admitting: Family Medicine

## 2018-01-30 DIAGNOSIS — Z5181 Encounter for therapeutic drug level monitoring: Secondary | ICD-10-CM

## 2018-01-30 DIAGNOSIS — E785 Hyperlipidemia, unspecified: Secondary | ICD-10-CM

## 2018-01-30 LAB — COMPREHENSIVE METABOLIC PANEL
ALT: 13 IU/L (ref 0–32)
AST: 13 IU/L (ref 0–40)
Albumin/Globulin Ratio: 1.3 (ref 1.2–2.2)
Albumin: 4 g/dL (ref 3.5–4.8)
Alkaline Phosphatase: 92 IU/L (ref 39–117)
BUN/Creatinine Ratio: 20 (ref 12–28)
BUN: 29 mg/dL — ABNORMAL HIGH (ref 8–27)
Bilirubin Total: 0.4 mg/dL (ref 0.0–1.2)
CO2: 23 mmol/L (ref 20–29)
Calcium: 9.7 mg/dL (ref 8.7–10.3)
Chloride: 101 mmol/L (ref 96–106)
Creatinine, Ser: 1.43 mg/dL — ABNORMAL HIGH (ref 0.57–1.00)
GFR calc Af Amer: 43 mL/min/{1.73_m2} — ABNORMAL LOW (ref >59)
GFR calc non Af Amer: 37 mL/min/{1.73_m2} — ABNORMAL LOW (ref >59)
Globulin, Total: 3.2 g/dL (ref 1.5–4.5)
Glucose: 87 mg/dL (ref 65–99)
Potassium: 4.7 mmol/L (ref 3.5–5.2)
Sodium: 138 mmol/L (ref 134–144)
Total Protein: 7.2 g/dL (ref 6.0–8.5)

## 2018-01-30 LAB — LIPID PANEL
Chol/HDL Ratio: 4.1 ratio (ref 0.0–4.4)
Cholesterol, Total: 276 mg/dL — ABNORMAL HIGH (ref 100–199)
HDL: 67 mg/dL (ref >39)
LDL Calculated: 189 mg/dL — ABNORMAL HIGH (ref 0–99)
Triglycerides: 101 mg/dL (ref 0–149)
VLDL Cholesterol Cal: 20 mg/dL (ref 5–40)

## 2018-01-30 NOTE — Progress Notes (Signed)
Lab only visit 

## 2018-02-06 ENCOUNTER — Encounter: Payer: Self-pay | Admitting: Physical Therapy

## 2018-02-06 NOTE — Therapy (Signed)
[facility] [place], [place], [address] Phone: [phone]   Fax:  [phone]  Patient Details  Name: Alyssa Jefferson MRN: [id number] Date of Birth: June 09, 1946 Referring Provider:  No ref. provider found  Encounter Date: 02/06/2018  PHYSICAL THERAPY DISCHARGE SUMMARY  Visits from Start of Care: 4  Current functional level related to goals / functional outcomes: Has not returned since last scheduled session- DC    Remaining deficits: Unable to assess    Education / Equipment: N/A  Plan: Patient agrees to discharge.  Patient goals were not met. Patient is being discharged due to not returning since the last visit.  ?????       Deniece Ree PT, DPT, CBIS  Supplemental Physical Therapist [place]   Pager [facility] [address] [place], [place], [address] Phone: [phone]   Fax:  [phone]

## 2018-02-08 MED ORDER — ROSUVASTATIN CALCIUM 20 MG PO TABS: 20.0000 mg | ORAL_TABLET | Freq: Every day | ORAL | 1 refills | Status: DC

## 2018-02-28 ENCOUNTER — Other Ambulatory Visit: Payer: Self-pay

## 2018-02-28 NOTE — Patient Outreach (Signed)
[facility]) Care Management  02/28/2018  MARILUZ CRESPO Mar 03, 1946 [id number]   Medication Adherence call to Mrs. Salomon Mast patient did not answer Mrs. Stamos is due on Rosuvatatin 10 mg [facility] said patient pick up 90 days supply on 02/26/18. Patient is showing past due under [place].  [place] Management Direct Dial [phone]  Fax [phone] [email or web address]

## 2018-03-14 ENCOUNTER — Ambulatory Visit: Payer: Medicare Other | Admitting: Family Medicine

## 2018-03-14 ENCOUNTER — Ambulatory Visit (INDEPENDENT_AMBULATORY_CARE_PROVIDER_SITE_OTHER): Payer: Medicare Other

## 2018-03-14 ENCOUNTER — Encounter: Payer: Self-pay | Admitting: Family Medicine

## 2018-03-14 VITALS — BP 136/73 | HR 66 | Temp 97.7°F | Resp 17 | Ht 62.0 in | Wt 231.0 lb

## 2018-03-14 DIAGNOSIS — R110 Nausea: Secondary | ICD-10-CM

## 2018-03-14 DIAGNOSIS — R1013 Epigastric pain: Secondary | ICD-10-CM | POA: Diagnosis not present

## 2018-03-14 DIAGNOSIS — Z113 Encounter for screening for infections with a predominantly sexual mode of transmission: Secondary | ICD-10-CM | POA: Diagnosis not present

## 2018-03-14 NOTE — Progress Notes (Signed)
Chief Complaint  Patient presents with  . Nausea    x 2 weeks     HPI  Pt reports that she has been having 2 weeks of nausea She reports some postnasal drip She reports that she has a lot of mucus The mucus usually comes up and she spits it out She states that the lump in the RUQ that is like a big ball pops up especially if she leans over to the right on the right side   She reports that she goes to the bathroom and has 2-3 bowel movements a day She states that she is drinking plenty of water When she eats the food does not seem to go down like it should She gets pain over the stomach Her referral to [facility] did not happen due to previous unpaid bills     Past Medical History:  Diagnosis Date  . Arthritis    knees, back  . Cancer [facility]) 2004   breast  . Depression   . GERD (gastroesophageal reflux disease)   . Glaucoma   . Heart palpitations   . High cholesterol   . History of transfusion    many years ago  . Hypertension   . Hypothyroidism   . Itching in the vaginal area   . Nocturia   . OSA (obstructive sleep apnea)    tried CPAP- but unsuccessful  . Renal disorder   . Thyroid disease    thyroid removed    Current Outpatient Medications  Medication Sig Dispense Refill  . cetirizine (ZYRTEC) 10 MG tablet Take 1 tablet (10 mg total) by mouth daily. 30 tablet 11  . diltiazem (CARDIZEM CD) 120 MG 24 hr capsule Take 1 capsule (120 mg total) by mouth daily. 90 capsule 1  . latanoprost (XALATAN) 0.005 % ophthalmic solution     . levothyroxine (SYNTHROID, LEVOTHROID) 50 MCG tablet Take 1 tablet (50 mcg total) by mouth daily. OFFICE VISIT NEEDED 90 tablet 0  . rosuvastatin (CRESTOR) 20 MG tablet Take 1 tablet (20 mg total) by mouth at bedtime. 90 tablet 1  . spironolactone (ALDACTONE) 50 MG tablet Take 1 tablet (50 mg total) by mouth daily. 90 tablet 1   No current facility-administered medications for this visit.     Allergies:  Allergies  Allergen  Reactions  . Codeine Other (See Comments)    confusion  . Lipitor [Atorvastatin Calcium] Itching    Past Surgical History:  Procedure Laterality Date  . ABDOMINAL HYSTERECTOMY  83   BSO  . BREAST SURGERY  2004   left breast removed due to cancer no chemo or radiation  . MASTECTOMY Left   . THYROIDECTOMY     due to knot  . 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 Marital status: Widowed    Spouse name: Not on file  . Number of children: Not on file  . Years of education: Not on file  . Highest education level: Not on file  Occupational History  . Occupation: retired    Fish farm manager: RETIRED  Social Needs  . Financial resource strain: Not on file  . Food insecurity:    Worry: Not on file    Inability: Not on file  . Transportation needs:    Medical: Not on file    Non-medical: Not on file  Tobacco Use  . Smoking status: Former Smoker    Years: 20.00    Types: Cigarettes    Last attempt to quit: 08/16/1978    Years since quitting: 39.6  . Smokeless tobacco: Never Used  . Tobacco comment: 1 pack per week--10/16/12  Substance and Sexual Activity  . Alcohol use: No  . Drug use: No  . Sexual activity: Never  Lifestyle  . Physical activity:    Days per week: Not on file    Minutes per session: Not on file  . Stress: Not on file  Relationships  . Social connections:    Talks on phone: Not on file    Gets together: Not on file    Attends religious service: Not on file    Active member of club or organization: Not on file    Attends meetings of clubs or organizations: Not on file    Relationship status: Not on file  Other Topics Concern  . Not on file  Social History Narrative  . Not on file    Family History  Problem Relation Age of Onset  . Lung cancer Father   . Breast cancer Sister   .  Ovarian cancer Sister   . Colon cancer Neg Hx   . Esophageal cancer Neg Hx   . Stomach cancer Neg Hx   . Pancreatic cancer Neg Hx   . Liver disease Neg Hx      ROS Review of Systems See HPI Constitution: No fevers or chills No malaise No diaphoresis Skin: No rash or itching Eyes: no blurry vision, no double vision GU: no dysuria or hematuria Neuro: no dizziness or headaches all others reviewed and negative   Objective: Vitals:   03/14/18 0937  BP: 136/73  Pulse: 66  Resp: 17  Temp: 97.7 F (36.5 C)  TempSrc: Oral  SpO2: 98%  Weight: 231 lb (104.8 kg)  Height: 5\' 2"  (1.575 m)    Physical Exam  Constitutional: She is oriented to person, place, and time. She appears well-developed and well-nourished.  HENT:  Head: Normocephalic and atraumatic.  Eyes: Conjunctivae and EOM are normal.  Neck: Normal range of motion. Neck supple.  Cardiovascular: Normal rate, regular rhythm and normal heart sounds.  No murmur heard. Pulmonary/Chest: Effort normal and breath sounds normal. No stridor. No respiratory distress. She has no wheezes.  Abdominal: Soft. Bowel sounds are normal. She exhibits no distension and no mass. There is no tenderness. There is no rebound and no guarding. No hernia.  Neurological: She is alert and oriented to person, place, and time.  Skin: Skin is warm. Capillary refill takes less than 2 seconds.  Psychiatric: She has a normal mood and affect. Her behavior is normal. Judgment and thought content normal.   EXAM: ABDOMEN - 1 VIEW  COMPARISON:  Ultrasound 01/25/2017.  FINDINGS: Soft tissue structures are unremarkable. Stool noted throughout the colon. No bowel distention or free air. Diffuse degenerative changes lumbar spine and both hips. Pelvic calcifications consistent phleboliths. Buttock calcifications consistent and calcified injection granulomas.  IMPRESSION: Stool noted throughout the colon. Constipation cannot be excluded. No bowel  distention. No free air.   Electronically Signed   By: Marcello Moores  Register   On: 03/14/2018 10:17 Assessment and Plan Lavida was seen today for nausea.  Diagnoses and all orders for this visit:  Abdominal pain, epigastric-  Discussed referral to gastroenterology Pt was unable to go to previous referred appt due to previous bill Likely an abdominal wall defect  Causing  a reducible hernia Xray showed stool throughout the colon Advised 1/2 bottle of magnesium citrate for 2 days -     Ambulatory referral to Gastroenterology -     DG Abd 1 View; Future  Nausea without vomiting- could be reflux related or postnasal drip -     Ambulatory referral to Gastroenterology -     DG Abd 1 View; Future  Screening for STDs (sexually transmitted diseases)- will screen today -     GC/Chlamydia Probe Amp -     Hepatitis B surface antigen -     HIV antibody -     RPR     Alyssa Jefferson

## 2018-03-14 NOTE — Patient Instructions (Addendum)
Take 1/2 bottle of magnesium citrate today and tomorrow to flush out the colon Follow up with Gastroenterology    IF you received an x-ray today, you will receive an invoice from [facility]. Please contact [facility] at [phone] with questions or concerns regarding your invoice.   IF you received labwork today, you will receive an invoice from [facility]. Please contact [facility] at [phone] with questions or concerns regarding your invoice.   Our billing staff will not be able to assist you with questions regarding bills from these companies.  You will be contacted with the lab results as soon as they are available. The fastest way to get your results is to activate your My Chart account. Instructions are located on the last page of this paperwork. If you have not heard from [place] regarding the results in 2 weeks, please contact this office.      Constipation, Adult Constipation is when a person has fewer bowel movements in a week than normal, has difficulty having a bowel movement, or has stools that are dry, hard, or larger than normal. Constipation may be caused by an underlying condition. It may become worse with age if a person takes certain medicines and does not take in enough fluids. Follow these instructions at home: Eating and drinking   Eat foods that have a lot of fiber, such as fresh fruits and vegetables, whole grains, and beans.  Limit foods that are high in fat, low in fiber, or overly processed, such as french fries, hamburgers, cookies, candies, and soda.  Drink enough fluid to keep your urine clear or pale yellow. General instructions  Exercise regularly or as told by your health care provider.  Go to the restroom when you have the urge to go. Do not hold it in.  Take over-the-counter and prescription medicines only as told by your health care provider. These include any fiber supplements.  Practice pelvic floor retraining exercises, such  as deep breathing while relaxing the lower abdomen and pelvic floor relaxation during bowel movements.  Watch your condition for any changes.  Keep all follow-up visits as told by your health care provider. This is important. Contact a health care provider if:  You have pain that gets worse.  You have a fever.  You do not have a bowel movement after 4 days.  You vomit.  You are not hungry.  You lose weight.  You are bleeding from the anus.  You have thin, pencil-like stools. Get help right away if:  You have a fever and your symptoms suddenly get worse.  You leak stool or have blood in your stool.  Your abdomen is bloated.  You have severe pain in your abdomen.  You feel dizzy or you faint. This information is not intended to replace advice given to you by your health care provider. Make sure you discuss any questions you have with your health care provider. Document Released: 04/30/2004 Document Revised: 02/20/2016 Document Reviewed: 01/21/2016 Elsevier Interactive Patient Education  2018 [employer].

## 2018-03-15 LAB — RPR: RPR Ser Ql: NONREACTIVE

## 2018-03-15 LAB — GC/CHLAMYDIA PROBE AMP
Chlamydia trachomatis, NAA: NEGATIVE
Neisseria gonorrhoeae by PCR: NEGATIVE

## 2018-03-15 LAB — HIV ANTIBODY (ROUTINE TESTING W REFLEX): HIV Screen 4th Generation wRfx: NONREACTIVE

## 2018-03-15 LAB — HEPATITIS B SURFACE ANTIGEN: Hepatitis B Surface Ag: NEGATIVE

## 2018-03-16 ENCOUNTER — Other Ambulatory Visit: Payer: Self-pay | Admitting: Family Medicine

## 2018-03-16 DIAGNOSIS — E0580 Other thyrotoxicosis without thyrotoxic crisis or storm: Secondary | ICD-10-CM

## 2018-03-17 ENCOUNTER — Telehealth: Payer: Self-pay

## 2018-03-17 NOTE — Telephone Encounter (Signed)
Called pt. To inform her she would need OV for follow up for Thyroid medication. States she was "just there and I don't need another visit." Last TSH 06/09/17. Please advise.

## 2018-03-20 NOTE — Telephone Encounter (Signed)
Patient has 90 day sent in and will need to be seen before this runs out. Her last tsh was 10/18.   She has not been seen for her thyroid.

## 2018-04-28 DIAGNOSIS — N2581 Secondary hyperparathyroidism of renal origin: Secondary | ICD-10-CM | POA: Diagnosis not present

## 2018-04-28 DIAGNOSIS — Z5181 Encounter for therapeutic drug level monitoring: Secondary | ICD-10-CM | POA: Diagnosis not present

## 2018-04-29 LAB — BASIC METABOLIC PANEL
BUN/Creatinine Ratio: 16 (ref 12–28)
BUN: 24 mg/dL (ref 8–27)
CO2: 22 mmol/L (ref 20–29)
Calcium: 9.3 mg/dL (ref 8.7–10.3)
Chloride: 103 mmol/L (ref 96–106)
Creatinine, Ser: 1.54 mg/dL — ABNORMAL HIGH (ref 0.57–1.00)
GFR calc Af Amer: 39 mL/min/{1.73_m2} — ABNORMAL LOW (ref >59)
GFR calc non Af Amer: 34 mL/min/{1.73_m2} — ABNORMAL LOW (ref >59)
Glucose: 122 mg/dL — ABNORMAL HIGH (ref 65–99)
Potassium: 4.2 mmol/L (ref 3.5–5.2)
Sodium: 141 mmol/L (ref 134–144)

## 2018-05-12 DIAGNOSIS — N189 Chronic kidney disease, unspecified: Secondary | ICD-10-CM | POA: Diagnosis not present

## 2018-05-12 DIAGNOSIS — N183 Chronic kidney disease, stage 3 (moderate): Secondary | ICD-10-CM | POA: Diagnosis not present

## 2018-05-22 ENCOUNTER — Encounter: Payer: Self-pay | Admitting: Gastroenterology

## 2018-05-22 ENCOUNTER — Encounter (INDEPENDENT_AMBULATORY_CARE_PROVIDER_SITE_OTHER): Payer: Self-pay

## 2018-05-22 ENCOUNTER — Ambulatory Visit: Payer: Medicare Other | Admitting: Gastroenterology

## 2018-05-22 VITALS — BP 122/64 | HR 74 | Ht 62.0 in | Wt 230.5 lb

## 2018-05-22 DIAGNOSIS — R140 Abdominal distension (gaseous): Secondary | ICD-10-CM | POA: Diagnosis not present

## 2018-05-22 DIAGNOSIS — K5909 Other constipation: Secondary | ICD-10-CM | POA: Diagnosis not present

## 2018-05-22 NOTE — Patient Instructions (Addendum)
If you are age 71 or older, your body mass index should be between 23-30. Your Body mass index is 42.16 kg/m. If this is out of the aforementioned range listed, please consider follow up with your Primary Care Provider.  If you are age 44 or younger, your body mass index should be between 19-25. Your Body mass index is 42.16 kg/m. If this is out of the aformentioned range listed, please consider follow up with your Primary Care Provider.   It was a pleasure to see you today!  Dr. Loletha Carrow    Food Guidelines for a bloating/gas  Many people have difficulty digesting certain foods, causing a variety of distressing and embarrassing symptoms such as abdominal pain, bloating and gas.  These foods may need to be avoided or consumed in small amounts.  Here are some tips that might be helpful for you.  1.   Lactose intolerance is the difficulty or complete inability to digest lactose, the natural sugar in milk and anything made from milk.  This condition is harmless, common, and can begin any time during life.  Some people can digest a modest amount of lactose while others cannot tolerate any.  Also, not all dairy products contain equal amounts of lactose.  For example, hard cheeses such as parmesan have less lactose than soft cheeses such as cheddar.  Yogurt has less lactose than milk or cheese.  Many packaged foods (even many brands of bread) have milk, so read ingredient lists carefully.  It is difficult to test for lactose intolerance, so just try avoiding lactose as much as possible for a week and see what happens with your symptoms.  If you seem to be lactose intolerant, the best plan is to avoid it (but make sure you get calcium from another source).  The next best thing is to use lactase enzyme supplements, available over the counter everywhere.  Just know that many lactose intolerant people need to take several tablets with each serving of dairy to avoid symptoms.  Lastly, a lot of restaurant food is  made with milk or butter.  Many are things you might not suspect, such as mashed potatoes, rice and pasta (cooked with butter) and "grilled" items.  If you are lactose intolerant, it never hurts to ask your server what has milk or butter.  2.   Fiber is an important part of your diet, but not all fiber is well-tolerated.  Insoluble fiber such as bran is often consumed by normal gut bacteria and converted into gas.  Soluble fiber such as oats, squash, carrots and green beans are typically tolerated better.  3.   Some types of carbohydrates can be poorly digested.  Examples include: fructose (apples, cherries, pears, raisins and other dried fruits), fructans (onions, zucchini, large amounts of wheat), sorbitol/mannitol/xylitol and sucralose/Splenda (common artificial sweeteners), and raffinose (lentils, broccoli, cabbage, asparagus, brussel sprouts, many types of beans).  Do a Development worker, community for [employer] and you will find helpful information. Beano, a dietary supplement, will often help with raffinose-containing foods.  As with lactase tablets, you may need several per serving.  4.   Whenever possible, avoid processed food&meats and chemical additives.  High fructose corn syrup, a common sweetener, may be difficult to digest.  Eggs and soy (comes from the soybean, and added to many foods now) are other common bloating/gassy foods.  - Dr. Herma Ard [facility]astroenter[facility]

## 2018-05-22 NOTE — Progress Notes (Signed)
[facility] Progress Note  Chief Complaint: Abdominal pain  Subjective  History:  This is a 72 year old woman I saw once in June 2018 for chronic constipation and intermittent upper abdominal pain that was difficult to characterize, and had ceased bothering her by the time of her evaluation.  She had had previous endoscopic procedures with Dr. Michail Sermon.  She had had a previous ultrasound is suggested possible gallbladder stone or polyp and elevated LFTs on one occasion during her primary care office note for abdominal pain.  This raised suspicion for a hepatobiliary cause, so I sent her for an ultrasound, but there is no report and it appears she did not have that done. Primary care office note by Dr. Nolon Rod on 03/14/2018 reports patient complained of right upper quadrant pain that was worse when she bends over and like something pops out.  There was also a few weeks of nausea, a new referral to [place] was made.  Colon - [facility] 2015 - diverticulosis  K reports ongoing bloating and intermittent feelings of incomplete evacuation after bowel movements.  Sometimes she will have normal stool, other times it is small balls or pellets.  She has a strained some of the time, has not seen any frank rectal bleeding.  She feels a pressure in the rectum before and after bowel movements.  Her appetite is been good and she denies unanticipated weight loss. The previously described upper abdominal pain resolved, and the nausea she had months ago has not returned.  ROS: Cardiovascular:  no chest pain Respiratory: no dyspnea Arthralgias  Remainder of systems negative except as above  The patient's Past Medical, Family and Social History were reviewed and are on file in the EMR.  Objective:  Med list reviewed  Current Outpatient Medications:  .  cetirizine (ZYRTEC) 10 MG tablet, Take 1 tablet (10 mg total) by mouth daily., Disp: 30 tablet, Rfl: 11 .  diltiazem (CARDIZEM CD) 180 MG 24 hr  capsule, Take 180 mg by mouth daily., Disp: , Rfl:  .  esomeprazole (NEXIUM) 40 MG capsule, Take 40 mg by mouth daily at 12 noon., Disp: , Rfl:  .  latanoprost (XALATAN) 0.005 % ophthalmic solution, , Disp: , Rfl:  .  levothyroxine (SYNTHROID, LEVOTHROID) 50 MCG tablet, TAKE 1 TABLET BY MOUTH DAILY, Disp: 90 tablet, Rfl: 0 .  rosuvastatin (CRESTOR) 20 MG tablet, Take 1 tablet (20 mg total) by mouth at bedtime., Disp: 90 tablet, Rfl: 1 .  spironolactone (ALDACTONE) 50 MG tablet, Take 1 tablet (50 mg total) by mouth daily., Disp: 90 tablet, Rfl: 1   Vital signs in last 24 hrs: Vitals:   05/22/18 1355  BP: 122/64  Pulse: 74    Physical Exam   HEENT: sclera anicteric, oral mucosa moist without lesions  Neck: supple, no thyromegaly, JVD or lymphadenopathy  Cardiac: RRR without murmurs, S1S2 heard, no peripheral edema  Pulm: clear to auscultation bilaterally, normal RR and effort noted  Abdomen: soft, obese, no tenderness, with active bowel sounds. No guarding or palpable hepatosplenomegaly, limited by body habitus.  Skin; warm and dry, no jaundice or rash  Recent Labs:  CMP Latest Ref Rng & Units 04/28/2018 01/30/2018 06/09/2017  Glucose 65 - 99 mg/dL 122(H) 87 89  BUN 8 - 27 mg/dL 24 29(H) 18  Creatinine 0.57 - 1.00 mg/dL 1.54(H) 1.43(H) 1.32(H)  Sodium 134 - 144 mmol/L 141 138 140  Potassium 3.5 - 5.2 mmol/L 4.2 4.7 4.3  Chloride 96 - 106 mmol/L 103  101 101  CO2 20 - 29 mmol/L 22 23 23   Calcium 8.7 - 10.3 mg/dL 9.3 9.7 9.0  Total Protein 6.0 - 8.5 g/dL - 7.2 -  Total Bilirubin 0.0 - 1.2 mg/dL - 0.4 -  Alkaline Phos 39 - 117 IU/L - 92 -  AST 0 - 40 IU/L - 13 -  ALT 0 - 32 IU/L - 13 -   No recent CBC  Last TSH 1.37 05/2017  Radiologic studies:  Normal KUB July 2019, stool noted throughout colon.  @ASSESSMENTPLANBEGIN @ Assessment: Encounter Diagnoses  Name Primary?  . Chronic constipation Yes  . Abdominal bloating    As before, I suspect her constipation is  largely related to diverticulosis with a possible component of meds side effect from calcium channel blocker and perhaps pelvic floor dysfunction (it may be age-related, since she had no pregnancies).  I do not feel she needs a repeat colonoscopy.  I recommended daily MiraLAX which she had tried a couple times in the past but not taken regularly.  She had occasionally taken mineral oil" some other tablet".  On occasion she had taken magnesium citrate if she had not had a BM for several days.  I would prefer she try the MiraLAX since it is typically effective and also generally safe.  If it is not helping enough, we can consider a trial of Amitiza or Linzess.  Written dietary advice regarding bloating and gas was also given.  Total time 25 minutes, over half spent face-to-face with patient in counseling and coordination of care.   Nelida Meuse III

## 2018-05-29 DIAGNOSIS — N183 Chronic kidney disease, stage 3 (moderate): Secondary | ICD-10-CM | POA: Diagnosis not present

## 2018-05-29 DIAGNOSIS — D631 Anemia in chronic kidney disease: Secondary | ICD-10-CM | POA: Diagnosis not present

## 2018-05-29 DIAGNOSIS — N2581 Secondary hyperparathyroidism of renal origin: Secondary | ICD-10-CM | POA: Diagnosis not present

## 2018-05-29 DIAGNOSIS — I129 Hypertensive chronic kidney disease with stage 1 through stage 4 chronic kidney disease, or unspecified chronic kidney disease: Secondary | ICD-10-CM | POA: Diagnosis not present

## 2018-06-08 ENCOUNTER — Ambulatory Visit (INDEPENDENT_AMBULATORY_CARE_PROVIDER_SITE_OTHER): Payer: Medicare Other | Admitting: Family Medicine

## 2018-06-08 ENCOUNTER — Other Ambulatory Visit: Payer: Self-pay

## 2018-06-08 ENCOUNTER — Encounter: Payer: Self-pay | Admitting: Family Medicine

## 2018-06-08 VITALS — BP 146/72 | HR 69 | Temp 98.0°F | Resp 16 | Ht 62.0 in | Wt 233.2 lb

## 2018-06-08 DIAGNOSIS — Z23 Encounter for immunization: Secondary | ICD-10-CM | POA: Diagnosis not present

## 2018-06-08 DIAGNOSIS — Z5181 Encounter for therapeutic drug level monitoring: Secondary | ICD-10-CM | POA: Diagnosis not present

## 2018-06-08 DIAGNOSIS — L659 Nonscarring hair loss, unspecified: Secondary | ICD-10-CM | POA: Diagnosis not present

## 2018-06-08 DIAGNOSIS — E039 Hypothyroidism, unspecified: Secondary | ICD-10-CM

## 2018-06-08 DIAGNOSIS — R351 Nocturia: Secondary | ICD-10-CM

## 2018-06-08 NOTE — Patient Instructions (Signed)
° ° ° °  If you have lab work done today you will be contacted with your lab results within the next 2 weeks.  If you have not heard from us then please contact us. The fastest way to get your results is to register for My Chart. ° ° °IF you received an x-ray today, you will receive an invoice from [facility]. Please contact  Radiology at [phone] with questions or concerns regarding your invoice.  ° °IF you received labwork today, you will receive an invoice from [facility]. Please contact [facility] at [phone] with questions or concerns regarding your invoice.  ° °Our billing staff will not be able to assist you with questions regarding bills from these companies. ° °You will be contacted with the lab results as soon as they are available. The fastest way to get your results is to activate your My Chart account. Instructions are located on the last page of this paperwork. If you have not heard from us regarding the results in 2 weeks, please contact this office. °  ° ° ° °

## 2018-06-08 NOTE — Progress Notes (Signed)
Chief Complaint  Patient presents with  . hair loss x months, per pt thought it was her thyroid medici    Rash on neck intermittent for 4-5 months  . Immunizations    pt want pneumo 23 vaccine    HPI  She reports that she has been having hair thinning on the left side of her head She sleeps on her left side She reports that she gets chemical treatments and color treatments She uses a cotton pillow case She ties up her hair with a silk scarf  She takes levothyroxine 35mcg She states that she is concerned that her thyroid might be causing the hair loss She reports that in addition to hair thinning she is also having more drowsiness and fatigue She also attributes this to waking up every 2 hours at night to urinate She reports that it has worsened since taking the fluid pills  Lab Results  Component Value Date   TSH 1.370 06/09/2017    4 review of systems  Past Medical History:  Diagnosis Date  . Arthritis    knees, back  . Cancer [facility]) 2004   breast  . Depression   . GERD (gastroesophageal reflux disease)   . Glaucoma   . Heart palpitations   . High cholesterol   . History of transfusion    many years ago  . Hypertension   . Hypothyroidism   . Itching in the vaginal area   . Nocturia   . OSA (obstructive sleep apnea)    tried CPAP- but unsuccessful  . Renal disorder   . Thyroid disease    thyroid removed    Current Outpatient Medications  Medication Sig Dispense Refill  . cetirizine (ZYRTEC) 10 MG tablet Take 1 tablet (10 mg total) by mouth daily. 30 tablet 11  . diltiazem (CARDIZEM CD) 180 MG 24 hr capsule Take 180 mg by mouth daily.    Marland Kitchen esomeprazole (NEXIUM) 40 MG capsule Take 40 mg by mouth daily at 12 noon.    . latanoprost (XALATAN) 0.005 % ophthalmic solution     . levothyroxine (SYNTHROID, LEVOTHROID) 50 MCG tablet TAKE 1 TABLET BY MOUTH DAILY 90 tablet 0  . rosuvastatin (CRESTOR) 20 MG tablet Take 1 tablet (20 mg total) by mouth at bedtime. 90  tablet 1  . spironolactone (ALDACTONE) 50 MG tablet Take 1 tablet (50 mg total) by mouth daily. 90 tablet 1   No current facility-administered medications for this visit.     Allergies:  Allergies  Allergen Reactions  . Codeine Other (See Comments)    confusion  . Lipitor [Atorvastatin Calcium] Itching    Past Surgical History:  Procedure Laterality Date  . ABDOMINAL HYSTERECTOMY  83   BSO  . BREAST SURGERY  2004   left breast removed due to cancer no chemo or radiation  . MASTECTOMY Left   . THYROIDECTOMY     due to knot  . 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 Marital status: Widowed    Spouse name: Not on file  . Number of children: Not on file  . Years of education: Not on file  . Highest education level: Not on file  Occupational History  . Occupation: retired    Fish farm manager: RETIRED  Social Needs  . Financial resource strain: Not on file  .  Food insecurity:    Worry: Not on file    Inability: Not on file  . Transportation needs:    Medical: Not on file    Non-medical: Not on file  Tobacco Use  . Smoking status: Former Smoker    Years: 20.00    Types: Cigarettes    Last attempt to quit: 08/16/1978    Years since quitting: 39.8  . Smokeless tobacco: Never Used  . Tobacco comment: 1 pack per week--10/16/12  Substance and Sexual Activity  . Alcohol use: No  . Drug use: No  . Sexual activity: Never  Lifestyle  . Physical activity:    Days per week: Not on file    Minutes per session: Not on file  . Stress: Not on file  Relationships  . Social connections:    Talks on phone: Not on file    Gets together: Not on file    Attends religious service: Not on file    Active member of club or organization: Not on file    Attends meetings of clubs or organizations: Not on file     Relationship status: Not on file  Other Topics Concern  . Not on file  Social History Narrative  . Not on file    Family History  Problem Relation Age of Onset  . Lung cancer Father   . Breast cancer Sister   . Ovarian cancer Sister   . Colon cancer Neg Hx   . Esophageal cancer Neg Hx   . Stomach cancer Neg Hx   . Pancreatic cancer Neg Hx   . Liver disease Neg Hx      ROS Review of Systems See HPI Constitution: No fevers or chills No malaise No diaphoresis Skin: No rash or itching Eyes: no blurry vision, no double vision GU: no dysuria or hematuria Neuro: no dizziness or headaches all others reviewed and negative   Objective: Vitals:   06/08/18 1035  BP: (!) 146/72  Pulse: 69  Resp: 16  Temp: 98 F (36.7 C)  TempSrc: Oral  SpO2: 98%  Weight: 233 lb 3.2 oz (105.8 kg)  Height: 5\' 2"  (1.575 m)    Physical Exam  Constitutional: She is oriented to person, place, and time. She appears well-developed and well-nourished.  HENT:  Head: Normocephalic and atraumatic.  Right Ear: External ear normal.  Left Ear: External ear normal.  Nose: Nose normal.  Mouth/Throat: Oropharynx is clear and moist.  Eyes: Conjunctivae and EOM are normal.  Neck: Normal range of motion. Neck supple. No thyromegaly present.  Cardiovascular: Normal rate, regular rhythm and normal heart sounds.  No murmur heard. Pulmonary/Chest: Effort normal and breath sounds normal. No stridor. No respiratory distress. She has no wheezes.  Abdominal: Soft. Bowel sounds are normal. She exhibits no distension and no mass. There is no tenderness. There is no guarding.  Musculoskeletal: Normal range of motion. She exhibits no edema.  Neurological: She is alert and oriented to person, place, and time.  Skin: Skin is warm. Capillary refill takes less than 2 seconds.  Psychiatric: She has a normal mood and affect. Her behavior is normal. Judgment and thought content normal.     Assessment and Plan Amayia was  seen today for hair loss x months, per pt thought it was her thyroid medici and immunizations.  Diagnoses and all orders for this visit:  Hair thinning- will check for other risk factors, discussed monitoring -     CBC -     TSH +  free T4  Need for prophylactic vaccination against Streptococcus pneumoniae (pneumococcus) -     Pneumococcal polysaccharide vaccine 23-valent greater than or equal to 2yo subcutaneous/IM  Encounter for medication monitoring- discussed that will check anemia and thyroid today -     CBC -     TSH + free T4  Acquired hypothyroidism-  Will reassess thyroid levels -     CBC -     TSH + free T4  Nocturia- discussed that this could be spironolactone side effect vs overactive bladder Will do a spironolactone holiday for a week If there are no changes      Alyssa Jefferson

## 2018-06-09 LAB — TSH+FREE T4
Free T4: 1.42 ng/dL (ref 0.82–1.77)
TSH: 1.03 u[IU]/mL (ref 0.450–4.500)

## 2018-06-09 LAB — CBC
Hematocrit: 38 % (ref 34.0–46.6)
Hemoglobin: 12.1 g/dL (ref 11.1–15.9)
MCH: 25.7 pg — ABNORMAL LOW (ref 26.6–33.0)
MCHC: 31.8 g/dL (ref 31.5–35.7)
MCV: 81 fL (ref 79–97)
Platelets: 347 10*3/uL (ref 150–450)
RBC: 4.7 x10E6/uL (ref 3.77–5.28)
RDW: 13.6 % (ref 12.3–15.4)
WBC: 7.2 10*3/uL (ref 3.4–10.8)

## 2018-06-12 ENCOUNTER — Telehealth: Payer: Self-pay

## 2018-06-12 NOTE — Telephone Encounter (Signed)
Letter sent over to [facility] re: worsening nocturia via fax to [phone] attn: Elmarie Shiley. Confirmation fax received 11:55 a.m. On 10.24.2019/ Dgaddy, CMA

## 2018-06-15 ENCOUNTER — Other Ambulatory Visit: Payer: Self-pay | Admitting: Family Medicine

## 2018-06-15 DIAGNOSIS — E0580 Other thyrotoxicosis without thyrotoxic crisis or storm: Secondary | ICD-10-CM

## 2018-06-29 ENCOUNTER — Other Ambulatory Visit: Payer: Self-pay | Admitting: Family Medicine

## 2018-06-29 MED ORDER — ESOMEPRAZOLE MAGNESIUM 40 MG PO CPDR: 40.0000 mg | DELAYED_RELEASE_CAPSULE | Freq: Every day | ORAL | 0 refills | Status: DC

## 2018-06-29 NOTE — Telephone Encounter (Signed)
Copied from [place] [id number]. Topic: Quick Communication - Rx Refill/Question >> Jun 29, 2018  8:43 AM Keene Breath wrote: Medication: esomeprazole (NEXIUM) 40 MG capsule  Patient called to request a refill for the above medication.  Preferred Pharmacy (with phone number or street name): [facility] [place], [address] AT [address] [phone] (Phone) [phone] (Fax)

## 2018-07-28 ENCOUNTER — Encounter: Payer: Self-pay | Admitting: Family Medicine

## 2018-09-18 ENCOUNTER — Other Ambulatory Visit: Payer: Self-pay | Admitting: Family Medicine

## 2018-09-18 DIAGNOSIS — E0580 Other thyrotoxicosis without thyrotoxic crisis or storm: Secondary | ICD-10-CM

## 2018-09-18 DIAGNOSIS — Z5181 Encounter for therapeutic drug level monitoring: Secondary | ICD-10-CM

## 2018-09-18 NOTE — Telephone Encounter (Signed)
Copied from [place] [id number]. Topic: Quick Communication - Rx Refill/Question >> Sep 18, 2018  5:25 PM Sallee Provencal, [place] B, NT wrote: Medication: rosuvastatin (CRESTOR) 20 MG tablet esomeprazole (NEXIUM) 40 MG capsule levothyroxine (SYNTHROID, LEVOTHROID) 50 MCG tablet spironolactone (ALDACTONE) 50 MG tablet  Has the patient contacted their pharmacy? Yes.   (Agent: If no, request that the patient contact the pharmacy for the refill.) (Agent: If yes, when and what did the pharmacy advise?)  Preferred Pharmacy (with phone number or street name): [facility] [place], [place]: Please be advised that RX refills may take up to 3 business days. We ask that you follow-up with your pharmacy.

## 2018-09-19 ENCOUNTER — Ambulatory Visit: Payer: Self-pay | Admitting: *Deleted

## 2018-09-19 MED ORDER — LEVOTHYROXINE SODIUM 50 MCG PO TABS: 50.0000 ug | ORAL_TABLET | Freq: Every day | ORAL | 0 refills | Status: DC

## 2018-09-19 MED ORDER — SPIRONOLACTONE 50 MG PO TABS: ORAL_TABLET | ORAL | 0 refills | Status: DC

## 2018-09-19 MED ORDER — ROSUVASTATIN CALCIUM 20 MG PO TABS: 20.0000 mg | ORAL_TABLET | Freq: Every day | ORAL | 1 refills | Status: DC

## 2018-09-19 MED ORDER — ESOMEPRAZOLE MAGNESIUM 40 MG PO CPDR: 40.0000 mg | DELAYED_RELEASE_CAPSULE | Freq: Every day | ORAL | 0 refills | Status: DC

## 2018-09-19 NOTE — Telephone Encounter (Signed)
FYI

## 2018-09-19 NOTE — Telephone Encounter (Signed)
Pt called with having a swollen right ankle. it is red and painful. Started swelling last night and more swollen today. Pt denies fever, shortness of breath, calf pain or chest pain Has not taken Tylenol for the pain. No available appointment for today with her provider (per pt request). Recommended going to an urgent care per protocol. She stated that she would. Home care advice given to elevate her ankle, try cool cloth and take Tylenol. Pt voiced understanding.  Reason for Disposition . [1] Redness AND [2] painful when touched AND [3] no fever  Answer Assessment - Initial Assessment Questions 1. LOCATION: "Which joint is swollen?"     Right ankle 2. ONSET: "When did the swelling start?"     yesterday 3. SIZE: "How large is the swelling?"     puffy 4. PAIN: "Is there any pain?" If so, ask: "How bad is it?" (Scale 1-10; or mild, moderate, severe)     Pain #10 5. CAUSE: "What do you think caused the swollen joint?"     Not sure 6. OTHER SYMPTOMS: "Do you have any other symptoms?" (e.g., fever, chest pain, difficulty breathing, calf pain)     no 7. PREGNANCY: "Is there any chance you are pregnant?" "When was your last menstrual period?"     n/a  Protocols used: ANKLE SWELLING-A-AH

## 2018-09-29 ENCOUNTER — Other Ambulatory Visit: Payer: Self-pay | Admitting: Family Medicine

## 2018-09-29 DIAGNOSIS — E0580 Other thyrotoxicosis without thyrotoxic crisis or storm: Secondary | ICD-10-CM

## 2018-09-30 NOTE — Telephone Encounter (Signed)
[name] can you see how Ms. Alyssa Jefferson is doing.  Let's have her follow up for an appointment.

## 2018-10-03 NOTE — Telephone Encounter (Signed)
Spoke with pt via phone ankle is better but still sore and continues to have the ring around it.  Per pt she needs to see stallings.  F/u scheduled for 10/10/18 at 9:40 and pt agreeable. FYI Dgaddy, CMA

## 2018-10-10 ENCOUNTER — Encounter: Payer: Self-pay | Admitting: Family Medicine

## 2018-10-10 ENCOUNTER — Other Ambulatory Visit: Payer: Self-pay

## 2018-10-10 ENCOUNTER — Ambulatory Visit (INDEPENDENT_AMBULATORY_CARE_PROVIDER_SITE_OTHER): Payer: Medicare Other | Admitting: Family Medicine

## 2018-10-10 ENCOUNTER — Ambulatory Visit (INDEPENDENT_AMBULATORY_CARE_PROVIDER_SITE_OTHER): Payer: Medicare Other

## 2018-10-10 VITALS — BP 130/77 | HR 68 | Temp 98.1°F | Resp 20 | Ht 61.1 in | Wt 229.6 lb

## 2018-10-10 DIAGNOSIS — Z1239 Encounter for other screening for malignant neoplasm of breast: Secondary | ICD-10-CM

## 2018-10-10 DIAGNOSIS — M25571 Pain in right ankle and joints of right foot: Secondary | ICD-10-CM | POA: Diagnosis not present

## 2018-10-10 DIAGNOSIS — R6889 Other general symptoms and signs: Secondary | ICD-10-CM | POA: Diagnosis not present

## 2018-10-10 DIAGNOSIS — M19071 Primary osteoarthritis, right ankle and foot: Secondary | ICD-10-CM | POA: Diagnosis not present

## 2018-10-10 DIAGNOSIS — K219 Gastro-esophageal reflux disease without esophagitis: Secondary | ICD-10-CM | POA: Diagnosis not present

## 2018-10-10 DIAGNOSIS — R0989 Other specified symptoms and signs involving the circulatory and respiratory systems: Secondary | ICD-10-CM

## 2018-10-10 NOTE — Patient Instructions (Addendum)
Add on pepcid to the nexium Please try to lose weight  A good goal is 2 pounds a month  Body mass index is 43.24 kg/m.  Wt Readings from Last 3 Encounters:  10/10/18 229 lb 9.6 oz (104.1 kg)  06/08/18 233 lb 3.2 oz (105.8 kg)  05/22/18 230 lb 8 oz (104.6 kg)    CLINICAL DATA:  Right ankle and foot pain. No reported injury.  EXAM: RIGHT ANKLE - COMPLETE 3+ VIEW  COMPARISON:  Right ankle dated 12/25/2013.  FINDINGS: Large posterior and inferior calcaneal spurs. Talotibial in distal fibular spur formation. 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 Mild dorsal tarsal spur formation. No fracture, dislocation or effusion seen.  IMPRESSION: Degenerative changes, as described above.   Electronically Signed   By: Claudie Revering M.D.   On: 10/10/2018 10:40   If you have lab work done today you will be contacted with your lab results within the next 2 weeks.  If you have not heard from [place] then please contact us. The fastest way to get your results is to register for My Chart.   IF you received an x-ray today, you will receive an invoice from [facility]. Please contact [facility] at [phone] with questions or concerns regarding your invoice.   IF you received labwork today, you will receive an invoice from [facility]. Please contact [facility] at [phone] with questions or concerns regarding your invoice.   Our billing staff will not be able to assist you with questions regarding bills from these companies.  You will be contacted with the lab results as soon as they are available. The fastest way to get your results is to activate your My Chart account. Instructions are located on the last page of this paperwork. If you have not heard from [place] regarding the results in 2 weeks, please contact this office.    We recommend that you schedule a mammogram for breast cancer screening. Typically, you  do not need a referral to do this. Please contact a local imaging center to schedule your mammogram.  [facility] - [phone]  *ask for the Radiology Department [facility]) - [phone] or [phone]  [facility] - [phone] [place] [phone] [facility] - [phone]  *ask for the [facility] - [phone]  *ask for the Radiology Department [facility] - [phone]  *ask for the [place] - [phone]

## 2018-10-10 NOTE — Progress Notes (Signed)
Established Patient Office Visit  Subjective:  Patient ID: Alyssa Jefferson, female    DOB: [birth year]  Age: 73 y.o. MRN: [id number]  CC:  Chief Complaint  Patient presents with  . Ankle Pain    x 2-3 weeks Right ankle    HPI Alyssa Jefferson presents for   Right ankle pain Pt reports that 2-3 weeks ago her ankle on the right started hurting She states that she wasn't really doing anything and thought maybe a spider bit her She also had a rash on her right ankle as well   Throat Problems Pt reports that she has been having trouble with her throat She states that she feels like she always has to clear her throat  And has some irritation like drainage She states that she feels  Like the cough is worse after eating or when she lies down She states that she takes nexium but it is still a problem   Obesity Pt reports that she has been gaining weight and is not exercising She states that she knows she needs to cut back on her eating She denies chest pains, palpitations or shortness of breath Wt Readings from Last 3 Encounters:  10/10/18 229 lb 9.6 oz (104.1 kg)  06/08/18 233 lb 3.2 oz (105.8 kg)  05/22/18 230 lb 8 oz (104.6 kg)     Past Medical History:  Diagnosis Date  . Arthritis    knees, back  . Cancer [facility]) 2004   breast  . Depression   . GERD (gastroesophageal reflux disease)   . Glaucoma   . Heart palpitations   . High cholesterol   . History of transfusion    many years ago  . Hypertension   . Hypothyroidism   . Itching in the vaginal area   . Nocturia   . OSA (obstructive sleep apnea)    tried CPAP- but unsuccessful  . Renal disorder   . Thyroid disease    thyroid removed    Past Surgical History:  Procedure Laterality Date  . ABDOMINAL HYSTERECTOMY  83   BSO  . BREAST SURGERY  2004   left breast removed due to cancer no chemo or radiation  . MASTECTOMY Left   . THYROIDECTOMY     due to knot  . TOTAL KNEE ARTHROPLASTY Left 07/28/2015   Procedure: LEFT TOTAL KNEE ARTHROPLASTY;  Surgeon: Gaynelle Arabian, MD;  Location: [facility];  Service: Orthopedics;  Laterality: Left;    Family History  Problem Relation Age of Onset  . Lung cancer Father   . Breast cancer Sister   . Ovarian cancer Sister   . Colon cancer Neg Hx   . Esophageal cancer Neg Hx   . Stomach cancer Neg Hx   . Pancreatic cancer Neg Hx   . Liver disease Neg Hx     Social History   Socioeconomic History  . Marital status: Widowed    Spouse name: Not on file  . Number of children: 5  . Years of education: Not on file  . Highest education level: Not on file  Occupational History  . Occupation: retired    Fish farm manager: RETIRED  Social Needs  . Financial resource strain: Not on file  . Food insecurity:    Worry: Not on file    Inability: Not on file  . Transportation needs:    Medical: Not on file    Non-medical: Not on file  Tobacco Use  . Smoking status: Former Smoker  Years: 20.00    Types: Cigarettes    Last attempt to quit: 08/16/1978    Years since quitting: 40.1  . Smokeless tobacco: Never Used  . Tobacco comment: 1 pack per week--10/16/12  Substance and Sexual Activity  . Alcohol use: No  . Drug use: No  . Sexual activity: Not Currently  Lifestyle  . Physical activity:    Days per week: Not on file    Minutes per session: Not on file  . Stress: Not on file  Relationships  . Social connections:    Talks on phone: Not on file    Gets together: Not on file    Attends religious service: Not on file    Active member of club or organization: Not on file    Attends meetings of clubs or organizations: Not on file    Relationship status: Not on file  . Intimate partner violence:    Fear of current or ex partner: Not on file    Emotionally abused: Not on file    Physically abused: Not on file    Forced sexual activity: Not on file  Other Topics Concern  . Not on file  Social History Narrative  . Not on file    Outpatient Medications  Prior to Visit  Medication Sig Dispense Refill  . cetirizine (ZYRTEC) 10 MG tablet Take 1 tablet (10 mg total) by mouth daily. 30 tablet 11  . diltiazem (CARDIZEM CD) 180 MG 24 hr capsule Take 180 mg by mouth daily.    Marland Kitchen esomeprazole (NEXIUM) 40 MG capsule Take 1 capsule (40 mg total) by mouth daily at 12 noon. 90 capsule 0  . latanoprost (XALATAN) 0.005 % ophthalmic solution     . levothyroxine (SYNTHROID, LEVOTHROID) 50 MCG tablet Take 1 tablet (50 mcg total) by mouth daily. 90 tablet 0  . rosuvastatin (CRESTOR) 20 MG tablet Take 1 tablet (20 mg total) by mouth at bedtime. 90 tablet 1  . spironolactone (ALDACTONE) 50 MG tablet TAKE 1 TABLET(50 MG) BY MOUTH DAILY 90 tablet 0   No facility-administered medications prior to visit.     Allergies  Allergen Reactions  . Codeine Other (See Comments)    confusion  . Lipitor [Atorvastatin Calcium] Itching    ROS Review of Systems Review of Systems  Constitutional: Negative for activity change, appetite change, chills and fever.  HENT: Negative for congestion, nosebleeds, trouble swallowing and voice change.   Respiratory: Negative for cough, shortness of breath and wheezing.   Gastrointestinal: Negative for diarrhea, nausea and vomiting.  Genitourinary: Negative for difficulty urinating, dysuria, flank pain and hematuria.  Musculoskeletal: see hpi Neurological: Negative for dizziness, speech difficulty, light-headedness and numbness.  See HPI. All other review of systems negative.     Objective:    Physical Exam  BP 130/77   Pulse 68   Temp 98.1 F (36.7 C) (Oral)   Resp 20   Ht 5' 1.1" (1.552 m)   Wt 229 lb 9.6 oz (104.1 kg)   SpO2 98%   BMI 43.24 kg/m  Wt Readings from Last 3 Encounters:  10/10/18 229 lb 9.6 oz (104.1 kg)  06/08/18 233 lb 3.2 oz (105.8 kg)  05/22/18 230 lb 8 oz (104.6 kg)   Physical Exam  Constitutional: Oriented to person, place, and time. Appears well-developed and well-nourished.  HENT:  Head:  Normocephalic and atraumatic.  Eyes: Conjunctivae and EOM are normal.  Cardiovascular: Normal rate, regular rhythm, normal heart sounds and intact distal pulses.  No  murmur heard. Pulmonary/Chest: Effort normal and breath sounds normal. No stridor. No respiratory distress. Has no wheezes.  Neurological: Is alert and oriented to person, place, and time.  Skin: Skin is warm. Capillary refill takes less than 2 seconds.  Psychiatric: Has a normal mood and affect. Behavior is normal. Judgment and thought content normal.   Right ankle exam Tenderness inferior to the medial malleolus Normal range of motion No edema    CLINICAL DATA:  Right ankle and foot pain. No reported injury.  EXAM: RIGHT ANKLE - COMPLETE 3+ VIEW  COMPARISON:  Right ankle dated 12/25/2013.  FINDINGS: Large posterior and inferior calcaneal spurs. Talotibial in distal fibular spur formation. 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 Mild dorsal tarsal spur formation. No fracture, dislocation or effusion seen.  IMPRESSION: Degenerative changes, as described above.   Electronically Signed   By: Claudie Revering M.D.   On: 10/10/2018 10:40  Health Maintenance Due  Topic Date Due  . MAMMOGRAM  07/30/2018    There are no preventive care reminders to display for this patient.  Lab Results  Component Value Date   TSH 1.030 06/08/2018   Lab Results  Component Value Date   WBC 7.2 06/08/2018   HGB 12.1 06/08/2018   HCT 38.0 06/08/2018   MCV 81 06/08/2018   PLT 347 06/08/2018   Lab Results  Component Value Date   NA 141 04/28/2018   K 4.2 04/28/2018   CO2 22 04/28/2018   GLUCOSE 122 (H) 04/28/2018   BUN 24 04/28/2018   CREATININE 1.54 (H) 04/28/2018   BILITOT 0.4 01/30/2018   ALKPHOS 92 01/30/2018   AST 13 01/30/2018   ALT 13 01/30/2018   PROT 7.2 01/30/2018   ALBUMIN 4.0 01/30/2018   CALCIUM 9.3 04/28/2018   ANIONGAP 8 01/11/2016     Lab Results  Component Value Date   CHOL 276 (H) 01/30/2018   Lab Results  Component Value Date   HDL 67 01/30/2018   Lab Results  Component Value Date   LDLCALC 189 (H) 01/30/2018   Lab Results  Component Value Date   TRIG 101 01/30/2018   Lab Results  Component Value Date   CHOLHDL 4.1 01/30/2018   Lab Results  Component Value Date   HGBA1C 5.7 (H) 09/20/2016      Assessment & Plan:   Problem List Items Addressed This Visit    None    Visit Diagnoses    Pain in joint involving right ankle and foot    -  Primary  Numerous spurs noted  Numerous areas of arthritis Referral placed for Podiatry   Relevant Orders   DG Ankle Complete Right (Completed)     Throat clearing    -  Advised her to take an additional pepcid To also limit the size of her meals to be smaller  To also avoid tight clothes If this does not improve then will send for EGD with GI    Morbid obesity ([place])    -  Slightly improved   Gastroesophageal reflux disease without esophagitis       Screening for breast cancer       Relevant Orders   MM Digital Screening      No orders of the defined types were placed in this encounter.   Follow-up: Return in about 3 months (around 01/08/2019) for weight check and reflux .    Forrest Moron, MD

## 2018-10-23 ENCOUNTER — Encounter: Payer: Self-pay | Admitting: Podiatry

## 2018-10-23 ENCOUNTER — Ambulatory Visit (INDEPENDENT_AMBULATORY_CARE_PROVIDER_SITE_OTHER): Payer: Medicare Other

## 2018-10-23 ENCOUNTER — Ambulatory Visit: Payer: Medicare Other | Admitting: Podiatry

## 2018-10-23 VITALS — BP 124/64 | HR 63

## 2018-10-23 DIAGNOSIS — M2141 Flat foot [pes planus] (acquired), right foot: Secondary | ICD-10-CM

## 2018-10-23 DIAGNOSIS — M2142 Flat foot [pes planus] (acquired), left foot: Secondary | ICD-10-CM | POA: Diagnosis not present

## 2018-10-23 DIAGNOSIS — M7750 Other enthesopathy of unspecified foot: Secondary | ICD-10-CM

## 2018-10-23 DIAGNOSIS — M7752 Other enthesopathy of left foot: Secondary | ICD-10-CM | POA: Diagnosis not present

## 2018-10-23 DIAGNOSIS — M7751 Other enthesopathy of right foot: Secondary | ICD-10-CM

## 2018-10-23 DIAGNOSIS — M19079 Primary osteoarthritis, unspecified ankle and foot: Secondary | ICD-10-CM

## 2018-10-25 ENCOUNTER — Telehealth: Payer: Self-pay | Admitting: Podiatry

## 2018-10-25 NOTE — Progress Notes (Signed)
Subjective:   Patient ID: Alyssa Jefferson, female   DOB: 73 y.o.   MRN: [id number]   HPI 73 year old female presents the office today for concerns of bilateral foot ankle pain with the right side much worse than left.  She sent states this is been ongoing for last 1 year and she points more to the lateral aspect foot and ankle where she gets majority of tenderness.  She is diabetic and her last A1c was 5.4.  She said no recent treatment for this.  She denies any numbness or tingling.  She has no other concerns.  Review of Systems  All other systems reviewed and are negative.  Past Medical History:  Diagnosis Date  . Arthritis    knees, back  . Cancer [facility]) 2004   breast  . Depression   . GERD (gastroesophageal reflux disease)   . Glaucoma   . Heart palpitations   . High cholesterol   . History of transfusion    many years ago  . Hypertension   . Hypothyroidism   . Itching in the vaginal area   . Nocturia   . OSA (obstructive sleep apnea)    tried CPAP- but unsuccessful  . Renal disorder   . Thyroid disease    thyroid removed    Past Surgical History:  Procedure Laterality Date  . ABDOMINAL HYSTERECTOMY  83   BSO  . BREAST SURGERY  2004   left breast removed due to cancer no chemo or radiation  . MASTECTOMY Left   . THYROIDECTOMY     due to knot  . TOTAL KNEE ARTHROPLASTY Left 07/28/2015   Procedure: LEFT TOTAL KNEE ARTHROPLASTY;  Surgeon: Gaynelle Arabian, MD;  Location: [facility];  Service: Orthopedics;  Laterality: Left;     Current Outpatient Medications:  .  cetirizine (ZYRTEC) 10 MG tablet, Take 1 tablet (10 mg total) by mouth daily., Disp: 30 tablet, Rfl: 11 .  diltiazem (CARDIZEM CD) 180 MG 24 hr capsule, Take 180 mg by mouth daily., Disp: , Rfl:  .  esomeprazole (NEXIUM) 40 MG capsule, Take 1 capsule (40 mg total) by mouth daily at 12 noon., Disp: 90 capsule, Rfl: 0 .  latanoprost (XALATAN) 0.005 % ophthalmic solution, , Disp: , Rfl:  .  levothyroxine (SYNTHROID,  LEVOTHROID) 50 MCG tablet, Take 1 tablet (50 mcg total) by mouth daily., Disp: 90 tablet, Rfl: 0 .  rosuvastatin (CRESTOR) 20 MG tablet, Take 1 tablet (20 mg total) by mouth at bedtime., Disp: 90 tablet, Rfl: 1 .  spironolactone (ALDACTONE) 50 MG tablet, TAKE 1 TABLET(50 MG) BY MOUTH DAILY, Disp: 90 tablet, Rfl: 0  Allergies  Allergen Reactions  . Codeine Other (See Comments)    confusion  . Lipitor [Atorvastatin Calcium] Itching         Objective:  Physical Exam  General: AAO x3, NAD  Dermatological: Skin is warm, dry and supple bilateral. Nails x 10 are well manicured; remaining integument appears unremarkable at this time. There are no open sores, no preulcerative lesions, no rash or signs of infection present.  Vascular: Dorsalis Pedis artery and Posterior Tibial artery pedal pulses are 2/4 bilateral with immedate capillary fill time. Pedal hair growth present. No varicosities and no lower extremity edema present bilateral. There is no pain with calf compression, swelling, warmth, erythema.   Neruologic: Grossly intact via light touch bilateral. Protective threshold with Semmes Wienstein monofilament intact to all pedal sites bilateral.   Musculoskeletal: There is very minimal tenderness to palpation on  the left side of her foot and ankle mostly on the lateral aspect.  No open.  Not able to elicit any area pinpoint any tenderness or pain to vibratory sensation.  On the right side there is tenderness on the course the peroneal tendon although mild today but she states the majority tenderness is with walking.  She has no pain and limited tenderness.  Mild discomfort on the anterior ankle joint line.  Flatfoot deformities present.  No other areas of tenderness.  Gait: Unassisted, Nonantalgic.       Assessment:   73 year old female with bilateral chronic foot ankle pain likely tendinitis due to biomechanical changes  Plan:  -Treatment options discussed including all alternatives,  risks, and complications -Etiology of symptoms were discussed -X-rays were obtained and reviewed with the patient.  There is no evidence of acute fracture or stress fracture identified today.   -Tri-Lock ankle brace was dispensed to the right side. -We will check orthotic coverage and we discussed shoe modifications. -Due to kidney function we will follow-up on anti-inflammatories.  Return in about 6 weeks (around 12/04/2018).  Trula Slade DPM

## 2018-10-25 NOTE — Telephone Encounter (Signed)
Left message for pt to call to discuss orthotic coverage. 

## 2018-10-27 NOTE — Telephone Encounter (Signed)
Pt returned call and is aware orthotics are not covered and is going to think about it and will discuss at her appt with you 4.20.Marland Kitchen Pt is aware of the payment plan(100.00 down and make payments)

## 2018-12-04 ENCOUNTER — Ambulatory Visit: Payer: Medicare Other | Admitting: Podiatry

## 2018-12-08 ENCOUNTER — Inpatient Hospital Stay: Admission: RE | Admit: 2018-12-08 | Payer: Self-pay | Source: Ambulatory Visit

## 2018-12-26 ENCOUNTER — Other Ambulatory Visit: Payer: Self-pay

## 2018-12-26 DIAGNOSIS — E785 Hyperlipidemia, unspecified: Secondary | ICD-10-CM

## 2018-12-26 DIAGNOSIS — D649 Anemia, unspecified: Secondary | ICD-10-CM

## 2018-12-28 ENCOUNTER — Telehealth: Payer: Self-pay | Admitting: Family Medicine

## 2018-12-28 ENCOUNTER — Other Ambulatory Visit: Payer: Self-pay

## 2018-12-28 ENCOUNTER — Other Ambulatory Visit: Payer: Self-pay | Admitting: Family Medicine

## 2018-12-28 DIAGNOSIS — E0580 Other thyrotoxicosis without thyrotoxic crisis or storm: Secondary | ICD-10-CM

## 2018-12-28 MED ORDER — LEVOTHYROXINE SODIUM 50 MCG PO TABS: 50.0000 ug | ORAL_TABLET | Freq: Every day | ORAL | 0 refills | Status: DC

## 2018-12-28 NOTE — Telephone Encounter (Signed)
Copied from [place] [id number]. Topic: Quick Communication - Rx Refill/Question >> Dec 28, 2018 12:27 PM Wynetta Emery, [place] C wrote: Medication: levothyroxine (SYNTHROID, LEVOTHROID) 50 MCG tablet  Has the patient contacted their pharmacy? Yes -- pt says that pharmacy has sent in several request  (Agent: If no, request that the patient contact the pharmacy for the refill.) (Agent: If yes, when and what did the pharmacy advise?)  Preferred Pharmacy (with phone number or street name): [facility] [phone] - [place], [place] - [address] [phone] (Phone) [phone] (Fax)    Agent: Please be advised that RX refills may take up to 3 business days. We ask that you follow-up with your pharmacy.

## 2019-01-01 ENCOUNTER — Encounter: Payer: Self-pay | Admitting: Family Medicine

## 2019-01-01 ENCOUNTER — Ambulatory Visit (INDEPENDENT_AMBULATORY_CARE_PROVIDER_SITE_OTHER): Payer: Medicare Other | Admitting: Family Medicine

## 2019-01-01 ENCOUNTER — Other Ambulatory Visit: Payer: Self-pay

## 2019-01-01 VITALS — BP 128/75 | HR 63 | Temp 98.5°F | Resp 16 | Wt 230.0 lb

## 2019-01-01 DIAGNOSIS — R1011 Right upper quadrant pain: Secondary | ICD-10-CM

## 2019-01-01 DIAGNOSIS — E0580 Other thyrotoxicosis without thyrotoxic crisis or storm: Secondary | ICD-10-CM

## 2019-01-01 DIAGNOSIS — I10 Essential (primary) hypertension: Secondary | ICD-10-CM

## 2019-01-01 DIAGNOSIS — E039 Hypothyroidism, unspecified: Secondary | ICD-10-CM

## 2019-01-01 DIAGNOSIS — D649 Anemia, unspecified: Secondary | ICD-10-CM | POA: Diagnosis not present

## 2019-01-01 DIAGNOSIS — Z9114 Patient's other noncompliance with medication regimen: Secondary | ICD-10-CM

## 2019-01-01 DIAGNOSIS — J4520 Mild intermittent asthma, uncomplicated: Secondary | ICD-10-CM

## 2019-01-01 DIAGNOSIS — Z5181 Encounter for therapeutic drug level monitoring: Secondary | ICD-10-CM

## 2019-01-01 DIAGNOSIS — Z91199 Patient's noncompliance with other medical treatment and regimen due to unspecified reason: Secondary | ICD-10-CM

## 2019-01-01 DIAGNOSIS — E785 Hyperlipidemia, unspecified: Secondary | ICD-10-CM

## 2019-01-01 MED ORDER — ROSUVASTATIN CALCIUM 20 MG PO TABS: 20.0000 mg | ORAL_TABLET | Freq: Every day | ORAL | 3 refills | Status: DC

## 2019-01-01 MED ORDER — DILTIAZEM HCL ER COATED BEADS 180 MG PO CP24: 180.0000 mg | ORAL_CAPSULE | Freq: Every day | ORAL | 1 refills | Status: DC

## 2019-01-01 MED ORDER — SPIRONOLACTONE 50 MG PO TABS: ORAL_TABLET | ORAL | 1 refills | Status: DC

## 2019-01-01 MED ORDER — LATANOPROST 0.005 % OP SOLN: 1.0000 [drp] | Freq: Every day | OPHTHALMIC | 6 refills | Status: AC

## 2019-01-01 MED ORDER — ESOMEPRAZOLE MAGNESIUM 40 MG PO CPDR: 40.0000 mg | DELAYED_RELEASE_CAPSULE | Freq: Every day | ORAL | 3 refills | Status: DC

## 2019-01-01 MED ORDER — ALBUTEROL SULFATE HFA 108 (90 BASE) MCG/ACT IN AERS: 2.0000 | INHALATION_SPRAY | Freq: Four times a day (QID) | RESPIRATORY_TRACT | 0 refills | Status: AC | PRN

## 2019-01-01 MED ORDER — CETIRIZINE HCL 10 MG PO TABS: 10.0000 mg | ORAL_TABLET | Freq: Every day | ORAL | 11 refills | Status: DC

## 2019-01-01 NOTE — Progress Notes (Signed)
Established Patient Office Visit  Subjective:  Patient ID: Alyssa Jefferson, female    DOB: Sep 09, 1945  Age: 73 y.o. MRN: [id number]  CC:  Chief Complaint  Patient presents with  . f/u weight check and reflux    Pt c/o sore on right side of stomach x couple of weeks thought it would go away but is still there and very sore  . Medication Refill    pt wants all meds refilled.  Pt thinks she will need an inhaler-dx with asthma but hasn't used an inhaler in years and would like one due to shortness of breath at times    HPI Alyssa Jefferson presents for   Abdominal pain Pt reports that to the right of her belly button has been hurting for a few weeks with a dull pain. She has a her gallbladder. No history of upper abdominal surgery. She has a history of reflux. Pain is not provoked by eating. No alleviating factors. It seems like it was in the lower pelvic area and now it has moved up. She would rate the pain a 3/10.  Hypertension: Patient here for follow-up of elevated blood pressure. She is not exercising and is adherent to low salt diet.  Blood pressure is well controlled at home. Cardiac symptoms none. Patient denies chest pain, chest pressure/discomfort, claudication, exertional chest pressure/discomfort, fatigue, irregular heart beat, lower extremity edema, near-syncope, orthopnea and palpitations.  Cardiovascular risk factors: advanced age (older than 58 for men, 77 for women), hypertension, obesity (BMI >= 30 kg/m2) and sedentary lifestyle. Use of agents associated with hypertension: none. History of target organ damage: none. BP Readings from Last 3 Encounters:  01/01/19 128/75  10/23/18 124/64  10/10/18 130/77   OSA She does not use her CPAP  she cannot tolerate the mask because she feels smothered She sleeps well at night Denies palpitations She reports daytime fatigue but avoids going to sleep in the day time.  Asthma She reports that she has not needed her albuterol in years  She stopped exercising and had an episode of shortness of breath She thinks it is because she is not exercising She would like an albuterol inhaler She has not had one for years    Morbid obesity Body mass index is 43.31 kg/m. Patient reports that she has not been exercise  She reports that she does not like to walk where she lives She states that if she walks across the lawn to her [place] she gets winded She has an exercise bike but has not been riding it  Wt Readings from Last 3 Encounters:  01/01/19 230 lb (104.3 kg)  10/10/18 229 lb 9.6 oz (104.1 kg)  06/08/18 233 lb 3.2 oz (105.8 kg)   Hypothyroidism: Patient presents for evaluation of thyroid function. Symptoms consist of heat intolerance. Symptoms have present for a few week. The symptoms are mild.  The problem has been gradually worsening.  Previous thyroid studies include TSH. The hypothyroidism is due to hypothyroidism.  Lab Results  Component Value Date   TSH 1.640 01/01/2019     Past Medical History:  Diagnosis Date  . Arthritis    knees, back  . Cancer [facility]) 2004   breast  . Depression   . GERD (gastroesophageal reflux disease)   . Glaucoma   . Heart palpitations   . High cholesterol   . History of transfusion    many years ago  . Hypertension   . Hypothyroidism   . Itching in  the vaginal area   . Nocturia   . OSA (obstructive sleep apnea)    tried CPAP- but unsuccessful  . Renal disorder   . Thyroid disease    thyroid removed    Past Surgical History:  Procedure Laterality Date  . ABDOMINAL HYSTERECTOMY  83   BSO  . BREAST SURGERY  2004   left breast removed due to cancer no chemo or radiation  . MASTECTOMY Left   . THYROIDECTOMY     due to knot  . TOTAL KNEE ARTHROPLASTY Left 07/28/2015   Procedure: LEFT TOTAL KNEE ARTHROPLASTY;  Surgeon: Gaynelle Arabian, MD;  Location: [facility];  Service: Orthopedics;  Laterality: Left;    Family History  Problem Relation Age of Onset  . Lung  cancer Father   . Breast cancer Sister   . Ovarian cancer Sister   . Colon cancer Neg Hx   . Esophageal cancer Neg Hx   . Stomach cancer Neg Hx   . Pancreatic cancer Neg Hx   . Liver disease Neg Hx     Social History   Socioeconomic History  . Marital status: Widowed    Spouse name: Not on file  . Number of children: 5  . Years of education: Not on file  . Highest education level: Not on file  Occupational History  . Occupation: retired    Fish farm manager: RETIRED  Social Needs  . Financial resource strain: Not on file  . Food insecurity:    Worry: Not on file    Inability: Not on file  . Transportation needs:    Medical: Not on file    Non-medical: Not on file  Tobacco Use  . Smoking status: Former Smoker    Years: 20.00    Types: Cigarettes    Last attempt to quit: 08/16/1978    Years since quitting: 40.4  . Smokeless tobacco: Never Used  . Tobacco comment: 1 pack per week--10/16/12  Substance and Sexual Activity  . Alcohol use: No  . Drug use: No  . Sexual activity: Not Currently  Lifestyle  . Physical activity:    Days per week: Not on file    Minutes per session: Not on file  . Stress: Not on file  Relationships  . Social connections:    Talks on phone: Not on file    Gets together: Not on file    Attends religious service: Not on file    Active member of club or organization: Not on file    Attends meetings of clubs or organizations: Not on file    Relationship status: Not on file  . Intimate partner violence:    Fear of current or ex partner: Not on file    Emotionally abused: Not on file    Physically abused: Not on file    Forced sexual activity: Not on file  Other Topics Concern  . Not on file  Social History Narrative  . Not on file    Outpatient Medications Prior to Visit  Medication Sig Dispense Refill  . levothyroxine (SYNTHROID) 50 MCG tablet Take 1 tablet (50 mcg total) by mouth daily. 30 tablet 0  . cetirizine (ZYRTEC) 10 MG tablet Take 1  tablet (10 mg total) by mouth daily. 30 tablet 11  . diltiazem (CARDIZEM CD) 180 MG 24 hr capsule Take 180 mg by mouth daily.    Marland Kitchen esomeprazole (NEXIUM) 40 MG capsule Take 1 capsule (40 mg total) by mouth daily at 12 noon. 90 capsule 0  .  latanoprost (XALATAN) 0.005 % ophthalmic solution     . rosuvastatin (CRESTOR) 20 MG tablet Take 1 tablet (20 mg total) by mouth at bedtime. 90 tablet 1  . spironolactone (ALDACTONE) 50 MG tablet TAKE 1 TABLET(50 MG) BY MOUTH DAILY 90 tablet 0   No facility-administered medications prior to visit.     Allergies  Allergen Reactions  . Codeine Other (See Comments)    confusion  . Lipitor [Atorvastatin Calcium] Itching    ROS Review of Systems Review of Systems  Constitutional: Negative for activity change, appetite change, chills and fever.  HENT: Negative for congestion, nosebleeds, trouble swallowing and voice change.   Respiratory: Negative for cough, shortness of breath and wheezing.   Gastrointestinal: see hpi Genitourinary: Negative for difficulty urinating, dysuria, flank pain and hematuria.  Musculoskeletal: Negative for back pain, joint swelling and neck pain.  Neurological: Negative for dizziness, speech difficulty, light-headedness and numbness.  See HPI. All other review of systems negative.     Objective:     BP 128/75 (BP Location: Left Arm, Patient Position: Sitting, Cuff Size: Large)   Pulse 63   Temp 98.5 F (36.9 C) (Oral)   Resp 16   Wt 230 lb (104.3 kg) Comment: per patient  SpO2 96%   BMI 43.31 kg/m   Physical Exam  Constitutional: She is oriented to person, place, and time. She appears well-developed and well-nourished.  HENT:  Head: Normocephalic and atraumatic.  Eyes: Conjunctivae and EOM are normal.  Cardiovascular: Normal rate, regular rhythm and normal heart sounds.  Pulmonary/Chest: Effort normal and breath sounds normal. No respiratory distress. She has no wheezes.  Abdominal: Soft. Bowel sounds are normal.  She exhibits no distension and no mass. There is no hepatosplenomegaly. There is abdominal tenderness in the right upper quadrant. There is no rigidity, no rebound, no guarding, no CVA tenderness, no tenderness at McBurney's point and negative Murphy's sign. No hernia.  Mild tenderness in the RUQ  Neurological: She is alert and oriented to person, place, and time. She has normal reflexes.  Psychiatric: She has a normal mood and affect. Her behavior is normal. Judgment and thought content normal.       Health Maintenance Due  Topic Date Due  . MAMMOGRAM  07/30/2018    There are no preventive care reminders to display for this patient.  Lab Results  Component Value Date   TSH 1.640 01/01/2019   Lab Results  Component Value Date   WBC 7.2 06/08/2018   HGB 12.1 06/08/2018   HCT 38.0 06/08/2018   MCV 81 06/08/2018   PLT 347 06/08/2018   Lab Results  Component Value Date   NA 141 01/01/2019   K 4.5 01/01/2019   CO2 23 01/01/2019   GLUCOSE 86 01/01/2019   BUN 18 01/01/2019   CREATININE 1.33 (H) 01/01/2019   BILITOT 0.4 01/01/2019   ALKPHOS 86 01/01/2019   AST 13 01/01/2019   ALT 12 01/01/2019   PROT 7.3 01/01/2019   ALBUMIN 4.2 01/01/2019   CALCIUM 9.6 01/01/2019   ANIONGAP 8 01/11/2016   Lab Results  Component Value Date   CHOL 205 (H) 01/01/2019   Lab Results  Component Value Date   HDL 65 01/01/2019   Lab Results  Component Value Date   LDLCALC 118 (H) 01/01/2019   Lab Results  Component Value Date   TRIG 112 01/01/2019   Lab Results  Component Value Date   CHOLHDL 3.2 01/01/2019   Lab Results  Component Value Date  HGBA1C 5.7 (H) 09/20/2016      Assessment & Plan:   Problem List Items Addressed This Visit      Endocrine   Hypothyroid - Primary     Relevant Orders   TSH (Completed)     Other   Anemia    Other Visit Diagnoses    Dyslipidemia       Relevant Medications   rosuvastatin (CRESTOR) 20 MG tablet   Other Relevant Orders   TSH  (Completed)   Morbid obesity ([place])    - discussed weight loss which contributes to sleep apnea   Relevant Orders   Ambulatory referral to Sleep Studies   Essential hypertension    - Patient's blood pressure is at goal of 139/89 or less. Condition is stable. Continue current medications and treatment plan. I recommend that you exercise for 30-45 minutes 5 days a week. I also recommend a balanced diet with fruits and vegetables every day, lean meats, and little fried foods. The DASH diet (you can find this online) is a good example of this.    Relevant Medications   diltiazem (CARDIZEM CD) 180 MG 24 hr capsule   rosuvastatin (CRESTOR) 20 MG tablet   spironolactone (ALDACTONE) 50 MG tablet   Other Relevant Orders   Ambulatory referral to Sleep Studies   Poor compliance with CPAP treatment    -  Will do new sleep study and see if there is a small device as well as reassess the patient's apnea    Relevant Orders   Ambulatory referral to Sleep Studies   RUQ abdominal pain    -  Discussed evaluation as outpatient, no acute abdomen noted   Relevant Orders   US Abdomen Limited RUQ   Encounter for medication monitoring    -  Will collect labs and do refills   Relevant Medications   rosuvastatin (CRESTOR) 20 MG tablet   Mild intermittent asthma without complication    - mild asthma, refilled meds    Relevant Medications   albuterol (VENTOLIN HFA) 108 (90 Base) MCG/ACT inhaler      Meds ordered this encounter  Medications  . cetirizine (ZYRTEC) 10 MG tablet    Sig: Take 1 tablet (10 mg total) by mouth daily.    Dispense:  30 tablet    Refill:  11  . diltiazem (CARDIZEM CD) 180 MG 24 hr capsule    Sig: Take 1 capsule (180 mg total) by mouth daily.    Dispense:  90 capsule    Refill:  1  . rosuvastatin (CRESTOR) 20 MG tablet    Sig: Take 1 tablet (20 mg total) by mouth at bedtime.    Dispense:  90 tablet    Refill:  3  . spironolactone (ALDACTONE) 50 MG tablet    Sig: TAKE 1 TABLET(50  MG) BY MOUTH DAILY    Dispense:  90 tablet    Refill:  1  . esomeprazole (NEXIUM) 40 MG capsule    Sig: Take 1 capsule (40 mg total) by mouth daily at 12 noon.    Dispense:  90 capsule    Refill:  3  . latanoprost (XALATAN) 0.005 % ophthalmic solution    Sig: Place 1 drop into both eyes at bedtime.    Dispense:  2.5 mL    Refill:  6  . albuterol (VENTOLIN HFA) 108 (90 Base) MCG/ACT inhaler    Sig: Inhale 2 puffs into the lungs every 6 (six) hours as needed for wheezing or shortness of  breath.    Dispense:  1 Inhaler    Refill:  0    Follow-up: Return in about 6 months (around 07/04/2019) for thyroid and bp check .    Forrest Moron, MD

## 2019-01-01 NOTE — Patient Instructions (Addendum)
If you have lab work done today you will be contacted with your lab results within the next 2 weeks.  If you have not heard from [place] then please contact us. The fastest way to get your results is to register for My Chart.   IF you received an x-ray today, you will receive an invoice from [facility]. Please contact [facility] at [phone] with questions or concerns regarding your invoice.   IF you received labwork today, you will receive an invoice from [facility]. Please contact [facility] at [phone] with questions or concerns regarding your invoice.   Our billing staff will not be able to assist you with questions regarding bills from these companies.  You will be contacted with the lab results as soon as they are available. The fastest way to get your results is to activate your My Chart account. Instructions are located on the last page of this paperwork. If you have not heard from [place] regarding the results in 2 weeks, please contact this office.     Living With Sleep Apnea Sleep apnea is a condition in which breathing pauses or becomes shallow during sleep. Sleep apnea is most commonly caused by a collapsed or blocked airway. People with sleep apnea snore loudly and have times when they gasp and stop breathing for 10 seconds or more during sleep. This happens over and over during the night. This disrupts your sleep and keeps your body from getting the rest that it needs, which can cause tiredness and lack of energy (fatigue) during the day. The breaks in breathing also interrupt the deep sleep that you need to feel rested. Even if you do not completely wake up from the gaps in breathing, your sleep may not be restful. You may also have a headache in the morning and low energy during the day, and you may feel anxious or depressed. How can sleep apnea affect me? Sleep apnea increases your chances of extreme tiredness during the day (daytime fatigue). It can also  increase your risk for health conditions, such as:  Heart attack.  Stroke.  Diabetes.  Heart failure.  Irregular heartbeat.  High blood pressure. If you have daytime fatigue as a result of sleep apnea, you may be more likely to:  Perform poorly at school or work.  Fall asleep while driving.  Have difficulty with attention.  Develop depression or anxiety.  Become severely overweight (obese).  Have sexual dysfunction. What actions can I take to manage sleep apnea? Sleep apnea treatment   If you were given a device to open your airway while you sleep, use it only as told by your health care provider. You may be given: ? An oral appliance. This is a custom-made mouthpiece that shifts your lower jaw forward. ? A continuous positive airway pressure (CPAP) device. This device blows air through a mask when you breathe out (exhale). ? A nasal expiratory positive airway pressure (EPAP) device. This device has valves that you put into each nostril. ? A bi-level positive airway pressure (BPAP) device. This device blows air through a mask when you breathe in (inhale) and breathe out (exhale).  You may need surgery if other treatments do not work for you. Sleep habits  Go to sleep and wake up at the same time every day. This helps set your internal clock (circadian rhythm) for sleeping. ? If you stay up later than usual, such as on weekends, try to get up in the morning within 2 hours  of your normal wake time.  Try to get at least 7-9 hours of sleep each night.  Stop computer, tablet, and mobile phone use a few hours before bedtime.  Do not take long naps during the day. If you nap, limit it to 30 minutes.  Have a relaxing bedtime routine. Reading or listening to music may relax you and help you sleep.  Use your bedroom only for sleep. ? Keep your television and computer out of your bedroom. ? Keep your bedroom cool, dark, and quiet. ? Use a supportive mattress and  pillows.  Follow your health care provider's instructions for other changes to sleep habits. Nutrition  Do not eat heavy meals in the evening.  Do not have caffeine in the later part of the day. The effects of caffeine can last for more than 5 hours.  Follow your health care provider's or dietitian's instructions for any diet changes. Lifestyle      Do not drink alcohol before bedtime. Alcohol can cause you to fall asleep at first, but then it can cause you to wake up in the middle of the night and have trouble getting back to sleep.  Do not use any products that contain nicotine or tobacco, such as cigarettes and e-cigarettes. If you need help quitting, ask your health care provider. Medicines  Take over-the-counter and prescription medicines only as told by your health care provider.  Do not use over-the-counter sleep medicine. You can become dependent on this medicine, and it can make sleep apnea worse.  Do not use medicines, such as sedatives and narcotics, unless told by your health care provider. Activity  Exercise on most days, but avoid exercising in the evening. Exercising near bedtime can interfere with sleeping.  If possible, spend time outside every day. Natural light helps regulate your circadian rhythm. General information  Lose weight if you need to, and maintain a healthy weight.  Keep all follow-up visits as told by your health care provider. This is important.  If you are having surgery, make sure to tell your health care provider that you have sleep apnea. You may need to bring your device with you. Where to find more information Learn more about sleep apnea and daytime fatigue from:  American Sleep Association: sleepassociation.[place]: sleepfoundation.org  National Heart, Lung, and Blood Institute: https://www.[place].biz/ Summary  Sleep apnea can cause daytime fatigue and other serious health conditions.  Both sleep apnea and daytime  fatigue can be bad for your health and well-being.  You may need to wear a device while sleeping to help keep your airway open.  If you are having surgery, make sure to tell your health care provider that you have sleep apnea. You may need to bring your device with you.  Making changes to sleep habits, diet, lifestyle, and activity can help you manage sleep apnea. This information is not intended to replace advice given to you by your health care provider. Make sure you discuss any questions you have with your health care provider. Document Released: 10/27/2017 Document Revised: 04/04/2018 Document Reviewed: 10/27/2017 Elsevier Interactive Patient Education  [employer].

## 2019-01-02 ENCOUNTER — Telehealth: Payer: Self-pay

## 2019-01-02 ENCOUNTER — Ambulatory Visit: Payer: Medicare Other | Admitting: Family Medicine

## 2019-01-02 LAB — COMPREHENSIVE METABOLIC PANEL
ALT: 12 IU/L (ref 0–32)
AST: 13 IU/L (ref 0–40)
Albumin/Globulin Ratio: 1.4 (ref 1.2–2.2)
Albumin: 4.2 g/dL (ref 3.7–4.7)
Alkaline Phosphatase: 86 IU/L (ref 39–117)
BUN/Creatinine Ratio: 14 (ref 12–28)
BUN: 18 mg/dL (ref 8–27)
Bilirubin Total: 0.4 mg/dL (ref 0.0–1.2)
CO2: 23 mmol/L (ref 20–29)
Calcium: 9.6 mg/dL (ref 8.7–10.3)
Chloride: 104 mmol/L (ref 96–106)
Creatinine, Ser: 1.33 mg/dL — ABNORMAL HIGH (ref 0.57–1.00)
GFR calc Af Amer: 46 mL/min/{1.73_m2} — ABNORMAL LOW (ref >59)
GFR calc non Af Amer: 40 mL/min/{1.73_m2} — ABNORMAL LOW (ref >59)
Globulin, Total: 3.1 g/dL (ref 1.5–4.5)
Glucose: 86 mg/dL (ref 65–99)
Potassium: 4.5 mmol/L (ref 3.5–5.2)
Sodium: 141 mmol/L (ref 134–144)
Total Protein: 7.3 g/dL (ref 6.0–8.5)

## 2019-01-02 LAB — LIPID PANEL
Chol/HDL Ratio: 3.2 ratio (ref 0.0–4.4)
Cholesterol, Total: 205 mg/dL — ABNORMAL HIGH (ref 100–199)
HDL: 65 mg/dL (ref >39)
LDL Calculated: 118 mg/dL — ABNORMAL HIGH (ref 0–99)
Triglycerides: 112 mg/dL (ref 0–149)
VLDL Cholesterol Cal: 22 mg/dL (ref 5–40)

## 2019-01-02 LAB — TSH: TSH: 1.64 u[IU]/mL (ref 0.450–4.500)

## 2019-01-02 NOTE — Telephone Encounter (Signed)
I called pt, she reports that her current cpap is greater than 72 years old, and she has not used it in years. She does not want to bring it to the office tomorrow.

## 2019-01-03 ENCOUNTER — Institutional Professional Consult (permissible substitution): Payer: Medicare Other | Admitting: Neurology

## 2019-01-04 ENCOUNTER — Telehealth: Payer: Self-pay

## 2019-01-04 MED ORDER — LEVOTHYROXINE SODIUM 50 MCG PO TABS: 50.0000 ug | ORAL_TABLET | Freq: Every day | ORAL | 3 refills | Status: DC

## 2019-01-04 NOTE — Telephone Encounter (Signed)
Pt did not show for their appt with Dr. Athar today.  

## 2019-01-04 NOTE — Telephone Encounter (Signed)
Patient called to r/s her appt from 01/03/2019. She has been r/s to 01/10/2019 at 4pm. Patient is aware of date and appt time.

## 2019-01-08 ENCOUNTER — Other Ambulatory Visit: Payer: Self-pay | Admitting: Family Medicine

## 2019-01-10 ENCOUNTER — Encounter: Payer: Self-pay | Admitting: Neurology

## 2019-01-10 ENCOUNTER — Other Ambulatory Visit: Payer: Self-pay

## 2019-01-10 ENCOUNTER — Ambulatory Visit (INDEPENDENT_AMBULATORY_CARE_PROVIDER_SITE_OTHER): Payer: Medicare Other | Admitting: Neurology

## 2019-01-10 VITALS — BP 158/69 | HR 67 | Temp 97.7°F | Ht 62.0 in | Wt 233.0 lb

## 2019-01-10 DIAGNOSIS — G4733 Obstructive sleep apnea (adult) (pediatric): Secondary | ICD-10-CM | POA: Diagnosis not present

## 2019-01-10 DIAGNOSIS — R351 Nocturia: Secondary | ICD-10-CM | POA: Diagnosis not present

## 2019-01-10 DIAGNOSIS — G4719 Other hypersomnia: Secondary | ICD-10-CM

## 2019-01-10 DIAGNOSIS — Z789 Other specified health status: Secondary | ICD-10-CM

## 2019-01-10 DIAGNOSIS — Z6841 Body Mass Index (BMI) 40.0 and over, adult: Secondary | ICD-10-CM

## 2019-01-10 NOTE — Patient Instructions (Signed)

## 2019-01-10 NOTE — Progress Notes (Signed)
Subjective:    Patient ID: Alyssa Jefferson is a 73 y.o. female.  HPI     Alyssa Age, MD, PhD [facility] [address], [address] [place],  [address]  Dear Dr. Nolon Rod, I saw your patient, Alyssa Jefferson, upon your kind request to my sleep clinic today for initial consultation of her sleep disorder, in particular, re-evaluation of her prior diagnosis of obstructive sleep apnea.  The patient is unaccompanied today.  Of note, she missed an in office appointment on 01/03/2019.  As you know, Alyssa Jefferson is a 73 year old right-handed woman with an underlying medical history of hypertension, hyperlipidemia, hypothyroidism, history of palpitations, reflux disease, glaucoma, arthritis, depression, history of cancer, and morbid obesity with a BMI of over 40, who was previously diagnosed with obstructive sleep apnea and tried CPAP therapy.  She could not tolerate it at the time.  I reviewed your office note from 01/01/2019.  She had a home sleep test on 10/18/2012 and I reviewed the results: AHI was 29.6/h, O2 nadir 73%.  Her weight was documented at 236 pounds at the time.  Her Epworth sleepiness score is 13 out of 24, fatigue severity score is 53 out of 63.  She reports that CPAP felt like she was smothering with air.  She reports that she had 2 sleep studies and that she has 2 old CPAP machines, the newest one is probably over 18 years old she estimates.  She would not be completely opposed to trying CPAP again but is not confident that she could tolerate it.  She has gained weight over time.  She is widowed and lives alone, her husband passed away 5 years ago.  She retired in 2008 from working at [employer].  She has 5 stepchildren. She is a non-smoker, drinks no caffeine on a day-to-day basis, typically no coffee, soda and tea maybe a couple of times a week.  She goes to bed between 11 and midnight and rise time is between 730 and 8:30 AM.  She has significant nocturia  about 5 or 6 times at night, but denies recurrent morning headaches, she is not aware of any family history of OSA.  Her Past Medical History Is Significant For: Past Medical History:  Diagnosis Date  . Arthritis    knees, back  . Cancer [facility]) 2004   breast  . Depression   . GERD (gastroesophageal reflux disease)   . Glaucoma   . Heart palpitations   . High cholesterol   . History of transfusion    many years ago  . Hypertension   . Hypothyroidism   . Itching in the vaginal area   . Nocturia   . OSA (obstructive sleep apnea)    tried CPAP- but unsuccessful  . Renal disorder   . Thyroid disease    thyroid removed    Her Past Surgical History Is Significant For: Past Surgical History:  Procedure Laterality Date  . ABDOMINAL HYSTERECTOMY  73   BSO  . BREAST SURGERY  2004   left breast removed due to cancer no chemo or radiation  . MASTECTOMY Left   . THYROIDECTOMY     due to knot  . TOTAL KNEE ARTHROPLASTY Left 07/28/2015   Procedure: LEFT TOTAL KNEE ARTHROPLASTY;  Surgeon: Gaynelle Arabian, MD;  Location: [facility];  Service: Orthopedics;  Laterality: Left;    Her Family History Is Significant For: Family History  Problem Relation Jefferson of Onset  . Lung cancer Father   .  Breast cancer Sister   . Ovarian cancer Sister   . Colon cancer Neg Hx   . Esophageal cancer Neg Hx   . Stomach cancer Neg Hx   . Pancreatic cancer Neg Hx   . Liver disease Neg Hx     Her Social History Is Significant For: Social History   Socioeconomic History  . Marital status: Widowed    Spouse name: Not on file  . Number of children: 5  . Years of education: Not on file  . Highest education level: Not on file  Occupational History  . Occupation: retired    Fish farm manager: RETIRED  Social Needs  . Financial resource strain: Not on file  . Food insecurity:    Worry: Not on file    Inability: Not on file  . Transportation needs:    Medical: Not on file    Non-medical: Not on file  Tobacco  Use  . Smoking status: Former Smoker    Years: 20.00    Types: Cigarettes    Last attempt to quit: 08/16/1978    Years since quitting: 40.4  . Smokeless tobacco: Never Used  . Tobacco comment: 1 pack per week--10/16/12  Substance and Sexual Activity  . Alcohol use: No  . Drug use: No  . Sexual activity: Not Currently  Lifestyle  . Physical activity:    Days per week: Not on file    Minutes per session: Not on file  . Stress: Not on file  Relationships  . Social connections:    Talks on phone: Not on file    Gets together: Not on file    Attends religious service: Not on file    Active member of club or organization: Not on file    Attends meetings of clubs or organizations: Not on file    Relationship status: Not on file  Other Topics Concern  . Not on file  Social History Narrative  . Not on file    Her Allergies Are:  Allergies  Allergen Reactions  . Codeine Other (See Comments)    confusion  . Lipitor [Atorvastatin Calcium] Itching  :   Her Current Medications Are:  Outpatient Encounter Medications as of 01/10/2019  Medication Sig  . albuterol (VENTOLIN HFA) 108 (90 Base) MCG/ACT inhaler Inhale 2 puffs into the lungs every 6 (six) hours as needed for wheezing or shortness of breath.  . cetirizine (ZYRTEC) 10 MG tablet Take 1 tablet (10 mg total) by mouth daily.  Marland Kitchen diltiazem (CARDIZEM CD) 180 MG 24 hr capsule Take 1 capsule (180 mg total) by mouth daily.  Marland Kitchen esomeprazole (NEXIUM) 40 MG capsule TAKE 1 CAPSULE BY MOUTH ONCE DAILY AT NOON  . latanoprost (XALATAN) 0.005 % ophthalmic solution Place 1 drop into both eyes at bedtime.  Marland Kitchen levothyroxine (SYNTHROID) 50 MCG tablet Take 1 tablet (50 mcg total) by mouth daily.  . rosuvastatin (CRESTOR) 20 MG tablet Take 1 tablet (20 mg total) by mouth at bedtime.  Marland Kitchen spironolactone (ALDACTONE) 50 MG tablet TAKE 1 TABLET(50 MG) BY MOUTH DAILY   No facility-administered encounter medications on file as of 01/10/2019.   :  Review of  Systems:  Out of a complete 14 point review of systems, all are reviewed and negative with the exception of these symptoms as listed below: Review of Systems  Neurological:       Pt presents today to discuss her sleep. Pt has had a sleep study many years ago and could not tolerate a cpap. Pt  does endorse snoring.  Epworth Sleepiness Scale 0= would never doze 1= slight chance of dozing 2= moderate chance of dozing 3= high chance of dozing  Sitting and reading: 1 Watching TV: 3 Sitting inactive in a public place (ex. Theater or meeting): 1 As a passenger in a car for an hour without a break: 2 Lying down to rest in the afternoon: 3 Sitting and talking to someone: 0 Sitting quietly after lunch (no alcohol): 3 In a car, while stopped in traffic: 0 Total: 13      Objective:  Neurological Exam  Physical Exam Physical Examination:   Vitals:   01/10/19 1605  BP: (!) 158/69  Pulse: 67  Temp: 97.7 F (36.5 C)   General Examination: The patient is a very pleasant 73 y.o. female in no acute distress. She appears well-developed and well-nourished and well groomed.   HEENT: Normocephalic, atraumatic, pupils are equal, round and reactive to light and accommodation. Extraocular tracking is good without limitation to gaze excursion or nystagmus noted. Normal smooth pursuit is noted. Hearing is grossly intact. Face is symmetric with normal facial animation and normal facial sensation. Speech is clear with no dysarthria noted. There is no hypophonia. There is no lip, neck/head, jaw or voice tremor. Neck is supple with full range of passive and active motion. There are no carotid bruits on auscultation. Oropharynx exam reveals: mild mouth dryness, adequate dental hygiene and moderate airway crowding, due to Small airway entry and redundant soft palate.  Tonsils on the smaller side, Mallampati class III, tongue protrudes centrally in palate elevates symmetrically, neck circumference of 15-1/4  inches.  She has a minimal overbite. Tongue protrudes centrally and palate elevates symmetrically.   Chest: Clear to auscultation without wheezing, rhonchi or crackles noted.  Heart: S1+S2+0, regular and normal without murmurs, rubs or gallops noted.   Abdomen: Soft, non-tender and non-distended with normal bowel sounds appreciated on auscultation.  Extremities: There is trace pitting edema in the distal lower extremities bilaterally.   Skin: Warm and dry without trophic changes noted.  Musculoskeletal: exam reveals no obvious joint deformities, tenderness or joint swelling or erythema.   Neurologically:  Mental status: The patient is awake, alert and oriented in all 4 spheres. Her immediate and remote memory, attention, language skills and fund of knowledge are appropriate. There is no evidence of aphasia, agnosia, apraxia or anomia. Speech is clear with normal prosody and enunciation. Thought process is linear. Mood is normal and affect is normal.  Cranial nerves II - XII are as described above under HEENT exam. In addition: shoulder shrug is normal with equal shoulder height noted. Motor exam: Normal bulk, strength and tone is noted. There is no drift, tremor or rebound. Romberg is negative. Reflexes are 2+ throughout. Fine motor skills and coordination: intact with normal finger taps, normal hand movements, normal rapid alternating patting, normal foot taps and normal foot agility.  Cerebellar testing: No dysmetria or intention tremor on finger to nose testing.  Sensory exam: intact to light touch.  Gait, station and balance: She stands easily. No veering to one side is noted. No leaning to one side is noted. Posture is Jefferson-appropriate and stance is narrow based. Gait shows normal stride length and normal pace. No problems turning are noted.  Assessment and Plan:  In summary, Alyssa Jefferson is a very pleasant 73 y.o.-year old female with an underlying medical history of hypertension,  hyperlipidemia, hypothyroidism, history of palpitations, reflux disease, glaucoma, arthritis, depression, history of cancer, and morbid  obesity with a BMI of over 40, whose history and physical exam are concerning for obstructive sleep apnea (OSA). I had a long chat with the patient about my findings and the diagnosis of OSA, its prognosis and treatment options. We talked about medical treatments, surgical interventions and non-pharmacological approaches. I explained in particular the risks and ramifications of untreated moderate to severe OSA, especially with respect to developing cardiovascular disease down the Road, including congestive heart failure, difficult to treat hypertension, cardiac arrhythmias, or stroke. Even type 2 diabetes has, in part, been linked to untreated OSA. Symptoms of untreated OSA include daytime sleepiness, memory problems, mood irritability and mood disorder such as depression and anxiety, lack of energy, as well as recurrent headaches, especially morning headaches. We talked about trying to maintain a healthy lifestyle in general, as well as the importance of weight control. I encouraged the patient to eat healthy, exercise daily and keep well hydrated, to keep a scheduled bedtime and wake time routine, to not skip any meals and eat healthy snacks in between meals. I advised the patient not to drive when feeling sleepy. I recommended the following at this time: sleep study.   I explained the sleep test procedure to the patient and also outlined possible surgical and non-surgical treatment options of OSA, including the use of a custom-made dental device (which would require a referral to a specialist dentist or oral surgeon), upper airway surgical options, such as pillar implants, radiofrequency surgery, tongue base surgery, and UPPP (which would involve a referral to an ENT surgeon). Rarely, jaw surgery such as mandibular advancement may be considered.  I also explained the CPAP  treatment option to the patient, who indicated that she would be willing to try CPAP again, if the need arises. I explained the importance of being compliant with PAP treatment, not only for insurance purposes but primarily to improve Her symptoms, and for the patient's long term health benefit, including to reduce Her cardiovascular risks. I answered all her questions today and the patient was in agreement. I would like to see her back after the sleep study is completed and encouraged her to call with any interim questions, concerns, problems or updates.   Thank you very much for allowing me to participate in the care of this nice patient. If I can be of any further assistance to you please do not hesitate to call me at [phone].  Sincerely,   Alyssa Age, MD, PhD

## 2019-01-11 ENCOUNTER — Ambulatory Visit: Payer: Medicare Other | Admitting: Family Medicine

## 2019-01-11 ENCOUNTER — Other Ambulatory Visit: Payer: Self-pay | Admitting: Cardiology

## 2019-02-05 ENCOUNTER — Other Ambulatory Visit: Payer: Self-pay

## 2019-02-05 ENCOUNTER — Ambulatory Visit
Admission: RE | Admit: 2019-02-05 | Discharge: 2019-02-05 | Disposition: A | Discharge status: Home / Self Care | Payer: Medicare Other | Source: Ambulatory Visit | Attending: Family Medicine | Admitting: Family Medicine

## 2019-02-05 DIAGNOSIS — K824 Cholesterolosis of gallbladder: Secondary | ICD-10-CM | POA: Diagnosis not present

## 2019-02-05 DIAGNOSIS — R1011 Right upper quadrant pain: Secondary | ICD-10-CM

## 2019-02-20 ENCOUNTER — Ambulatory Visit (INDEPENDENT_AMBULATORY_CARE_PROVIDER_SITE_OTHER): Payer: Medicare Other | Admitting: Neurology

## 2019-02-20 DIAGNOSIS — G4733 Obstructive sleep apnea (adult) (pediatric): Secondary | ICD-10-CM | POA: Diagnosis not present

## 2019-02-20 DIAGNOSIS — R351 Nocturia: Secondary | ICD-10-CM

## 2019-02-20 DIAGNOSIS — G4720 Circadian rhythm sleep disorder, unspecified type: Secondary | ICD-10-CM

## 2019-02-20 DIAGNOSIS — Z789 Other specified health status: Secondary | ICD-10-CM

## 2019-02-20 DIAGNOSIS — G4719 Other hypersomnia: Secondary | ICD-10-CM

## 2019-02-20 DIAGNOSIS — G4739 Other sleep apnea: Secondary | ICD-10-CM

## 2019-02-21 ENCOUNTER — Other Ambulatory Visit: Payer: Self-pay | Admitting: Critical Care Medicine

## 2019-02-21 DIAGNOSIS — R6889 Other general symptoms and signs: Secondary | ICD-10-CM | POA: Diagnosis not present

## 2019-02-21 DIAGNOSIS — Z20822 Contact with and (suspected) exposure to covid-19: Secondary | ICD-10-CM

## 2019-02-26 LAB — NOVEL CORONAVIRUS, NAA: SARS-CoV-2, NAA: NOT DETECTED

## 2019-03-01 ENCOUNTER — Telehealth: Payer: Self-pay

## 2019-03-01 NOTE — Telephone Encounter (Signed)
I called pt to discuss her sleep study results. No answer, left a message asking her to call me back. 

## 2019-03-01 NOTE — Procedures (Signed)
PATIENT'S NAME:  Alyssa Jefferson, Alyssa Jefferson DOB:      1946-03-15      MR#:    [id number]     DATE OF RECORDING: 02/20/2019 REFERRING M.D.:  Delia Chimes, MD Study Performed:   Baseline Polysomnogram HISTORY: 73 year old woman with a history of hypertension, hyperlipidemia, hypothyroidism, palpitations, reflux disease, glaucoma, arthritis, depression, history of cancer, and morbid obesity with a BMI of over 40, who was previously diagnosed with OSA and tried CPAP therapy.  She could not tolerate it at the time. The patient endorsed the Epworth Sleepiness Scale at 13 points. The patient's weight 233 pounds with a height of 62 (inches), resulting in a BMI of 43. kg/m2. The patient's neck circumference measured 15.25 inches.  CURRENT MEDICATIONS: Ventolin, Zyrtec, Cardizem, Nexium, Xalatan, Synthroid, Crestor, Aldactone   PROCEDURE:  This is a multichannel digital polysomnogram utilizing the Somnostar 11.2 system.  Electrodes and sensors were applied and monitored per AASM Specifications.   EEG, EOG, Chin and Limb EMG, were sampled at 200 Hz.  ECG, Snore and Nasal Pressure, Thermal Airflow, Respiratory Effort, CPAP Flow and Pressure, Oximetry was sampled at 50 Hz. Digital video and audio were recorded.      BASELINE STUDY  Lights Out was at 22:51 and Lights On at 04:59.  Total recording time (TRT) was 369 minutes, with a total sleep time (TST) of 194.5 minutes.   The patient's sleep latency was 65.5 minutes, which is delayed. REM latency was 318.5 minutes, which is markedly delayed. The sleep efficiency was 52.7 %, which is significantly reduced.     SLEEP ARCHITECTURE: WASO (Wake after sleep onset) was 146.5 minutes with moderate sleep fragmentation noted. There were 76 minutes in Stage N1, 68 minutes Stage N2, 28 minutes Stage N3 and 22.5 minutes in Stage REM.  The percentage of Stage N1 was 39.1%, which is markedly increased, Stage N2 was 35.%, which is reduced, Stage N3 was 14.4% and Stage R (REM sleep) was 11.6%,  which is reduced. The arousals were noted as: 55 were spontaneous, 0 were associated with PLMs, 38 were associated with respiratory events.  RESPIRATORY ANALYSIS:  There were a total of 83 respiratory events:  2 obstructive apneas, 7 central apneas and 23 mixed apneas with a total of 32 apneas and an apnea index (AI) of 9.9 /hour. There were 51 hypopneas with a hypopnea index of 15.7 /hour. The patient also had 0 respiratory event related arousals (RERAs).      The total APNEA/HYPOPNEA INDEX (AHI) was 25.6 /hour and the total RESPIRATORY DISTURBANCE INDEX was 25.6 /hour.  25 events occurred in REM sleep and 106 events in NREM. The REM AHI was 66.7 /hour, versus a non-REM AHI of 20.2. The patient spent 42 minutes of total sleep time in the supine position and 153 minutes in non-supine.. The supine AHI was 47.2 versus a non-supine AHI of 19.6.  OXYGEN SATURATION & C02:  The Wake baseline 02 saturation was 98%, with the lowest being 68%. Time spent below 89% saturation equaled 61 minutes.  PERIODIC LIMB MOVEMENTS:  The patient had a total of 0 Periodic Limb Movements.  The Periodic Limb Movement (PLM) index was 0 and the PLM Arousal index was 0/hour.  Audio and video analysis did not show any abnormal or unusual movements, behaviors, phonations or vocalizations. The patient took 2 bathroom breaks. Moderate snoring was noted. The EKG was in keeping with normal sinus rhythm (NSR). Post-study, the patient indicated that sleep was the same as usual.   IMPRESSION:  1. Obstructive Sleep Apnea(OSA) 2. Mixed sleep apnea 3. Dysfunctions associated with sleep stages or arousal from sleep  RECOMMENDATIONS: 1. This study demonstrates moderate to severe obstructive sleep apnea, and also mixed apneas and to a lesser degree, central sleep apnea, with a total AHI of 25.6/hour, REM AHI of 66.7/hour, supine AHI of 47.2/hour and O2 nadir of 68%. Treatment with positive airway pressure in the form of CPAP is recommended;  she may need BiPAP therapy. This will require a full night titration study to optimize therapy. Other treatment options may include avoidance of supine sleep position along with weight loss, upper airway or jaw surgery in selected patients or the use of an oral appliance in certain patients. ENT evaluation and/or consultation with a maxillofacial surgeon or dentist may be feasible in some instances.    2. Please note that untreated obstructive sleep apnea may carry additional perioperative morbidity. Patients with significant obstructive sleep apnea should receive perioperative PAP therapy and the surgeons and particularly the anesthesiologist should be informed of the diagnosis and the severity of the sleep disordered breathing. 3. This study shows sleep fragmentation and abnormal sleep stage percentages; these are nonspecific findings and per se do not signify an intrinsic sleep disorder or a cause for the patient's sleep-related symptoms. Causes include (but are not limited to) the first night effect of the sleep study, circadian rhythm disturbances, medication effect or an underlying mood disorder or medical problem.  4. The patient should be cautioned not to drive, work at heights, or operate dangerous or heavy equipment when tired or sleepy. Review and reiteration of good sleep hygiene measures should be pursued with any patient. 5. The patient will be seen in follow-up in the sleep clinic at [facility] for discussion of the test results, symptom and treatment compliance review, further management strategies, etc. The referring provider will be notified of the test results.  I certify that I have reviewed the entire raw data recording prior to the issuance of this report in accordance with the Standards of Accreditation of the American Academy of Sleep Medicine (AASM)   Star Age, MD, PhD Diplomat, American Board of Neurology and Sleep Medicine (Neurology and Sleep Medicine)

## 2019-03-01 NOTE — Telephone Encounter (Signed)

## 2019-03-01 NOTE — Progress Notes (Signed)
Patient referred by Dr. Nolon Rod, seen by me on 01/10/19, diagnostic PSG on 02/20/19.   Please call and notify the patient that the recent sleep study confirmed her prior Dx of obstructive sleep apnea, findings are in the moderate to severe range and I recommend treatment for this in the form of CPAP. This will require a repeat sleep study for proper titration and mask fitting and correct monitoring of the oxygen saturations. Please explain to patient. I have placed an order in the chart. Thanks.  Star Age, MD, PhD [facility])

## 2019-03-01 NOTE — Addendum Note (Signed)
Addended by: Star Age on: 03/01/2019 08:07 AM   Modules accepted: Orders

## 2019-03-01 NOTE — Telephone Encounter (Signed)
-----   Message from Star Age, MD sent at 03/01/2019  8:07 AM EDT ----- Patient referred by Dr. Nolon Rod, seen by me on 01/10/19, diagnostic PSG on 02/20/19.   Please call and notify the patient that the recent sleep study confirmed her prior Dx of obstructive sleep apnea, findings are in the moderate to severe range and I recommend treatment for this in the form of CPAP. This will require a repeat sleep study for proper titration and mask fitting and correct monitoring of the oxygen saturations. Please explain to patient. I have placed an order in the chart. Thanks.  Star Age, MD, PhD [facility])

## 2019-03-01 NOTE — Telephone Encounter (Signed)
Pt returning call please call back °

## 2019-03-06 ENCOUNTER — Ambulatory Visit
Admission: RE | Admit: 2019-03-06 | Discharge: 2019-03-06 | Disposition: A | Discharge status: Home / Self Care | Payer: Medicare Other | Source: Ambulatory Visit | Attending: Family Medicine | Admitting: Family Medicine

## 2019-03-06 ENCOUNTER — Other Ambulatory Visit: Payer: Self-pay

## 2019-03-06 DIAGNOSIS — Z1239 Encounter for other screening for malignant neoplasm of breast: Secondary | ICD-10-CM

## 2019-03-06 DIAGNOSIS — Z1231 Encounter for screening mammogram for malignant neoplasm of breast: Secondary | ICD-10-CM | POA: Diagnosis not present

## 2019-03-07 ENCOUNTER — Telehealth: Payer: Self-pay | Admitting: General Practice

## 2019-03-07 NOTE — Telephone Encounter (Signed)
Negative results was given to pt °

## 2019-03-13 NOTE — Telephone Encounter (Signed)
I called pt to discuss. No answer, left a message asking her to call me back. 

## 2019-03-13 NOTE — Telephone Encounter (Signed)
Pt has called asking to speak with RN Cyril Mourning re: her sleep study

## 2019-03-13 NOTE — Telephone Encounter (Signed)
Just called patient again and went to VM. Will try again in the am.

## 2019-03-13 NOTE — Telephone Encounter (Signed)
Pt called back. She believes she missed a call from our sleep lab to schedule her cpap titration study. Will ask our sleep lab to call her back.

## 2019-03-19 ENCOUNTER — Telehealth: Payer: Self-pay

## 2019-03-19 NOTE — Telephone Encounter (Signed)
We have attempted to call the patient two times to schedule sleep study.  Patient has been unavailable at the phone numbers we have on file and has not returned our calls. If patient calls back we will schedule them for their sleep study.  

## 2019-04-09 ENCOUNTER — Other Ambulatory Visit (HOSPITAL_COMMUNITY)
Admission: RE | Admit: 2019-04-09 | Discharge: 2019-04-09 | Disposition: A | Discharge status: Home / Self Care | Payer: Medicare Other | Source: Ambulatory Visit | Attending: Neurology | Admitting: Neurology

## 2019-04-09 DIAGNOSIS — Z01812 Encounter for preprocedural laboratory examination: Secondary | ICD-10-CM | POA: Diagnosis not present

## 2019-04-09 DIAGNOSIS — Z20828 Contact with and (suspected) exposure to other viral communicable diseases: Secondary | ICD-10-CM | POA: Insufficient documentation

## 2019-04-10 LAB — SARS CORONAVIRUS 2 (TAT 6-24 HRS): SARS Coronavirus 2: NEGATIVE

## 2019-04-12 ENCOUNTER — Ambulatory Visit (INDEPENDENT_AMBULATORY_CARE_PROVIDER_SITE_OTHER): Payer: Medicare Other | Admitting: Neurology

## 2019-04-12 ENCOUNTER — Other Ambulatory Visit: Payer: Self-pay

## 2019-04-12 DIAGNOSIS — R351 Nocturia: Secondary | ICD-10-CM

## 2019-04-12 DIAGNOSIS — Z789 Other specified health status: Secondary | ICD-10-CM

## 2019-04-12 DIAGNOSIS — Z6841 Body Mass Index (BMI) 40.0 and over, adult: Secondary | ICD-10-CM

## 2019-04-12 DIAGNOSIS — G4720 Circadian rhythm sleep disorder, unspecified type: Secondary | ICD-10-CM

## 2019-04-12 DIAGNOSIS — G4739 Other sleep apnea: Secondary | ICD-10-CM

## 2019-04-12 DIAGNOSIS — G4733 Obstructive sleep apnea (adult) (pediatric): Secondary | ICD-10-CM

## 2019-04-12 DIAGNOSIS — G4719 Other hypersomnia: Secondary | ICD-10-CM

## 2019-04-12 NOTE — Telephone Encounter (Signed)
Patient called stating she wants to know if she is going to stay the whole night for her sleep study.

## 2019-04-19 ENCOUNTER — Telehealth: Payer: Self-pay

## 2019-04-19 NOTE — Procedures (Signed)
S PATIENT'S NAME:  Alyssa Jefferson, Alyssa Jefferson DOB:      08-17-1945      MR#:    [id number]     DATE OF RECORDING: 04/12/2019 REFERRING M.D.:  Delia Chimes, MD Study Performed:   CPAP  Titration HISTORY: 73 year old woman with a history of hypertension, hyperlipidemia, hypothyroidism, palpitations, reflux disease, glaucoma, arthritis, depression, history of cancer, and morbid obesity with a BMI of over 40, who presents for a full night titration study. Her baseline sleep study from 02/20/19 showed an AHI of 25.6 /hour, REM AHI of 66.7/hour, and O2 nadir of 68%. The patient endorsed the Epworth Sleepiness Scale at 13 points. BMI of 43. kg/m2.  CURRENT MEDICATIONS: Ventolin, Zyrtec, Cardizem, Nexium, Xalatan, Synthroid, Crestor, Aldactone  PROCEDURE:  This is a multichannel digital polysomnogram utilizing the SomnoStar 11.2 system.  Electrodes and sensors were applied and monitored per AASM Specifications.   EEG, EOG, Chin and Limb EMG, were sampled at 200 Hz.  ECG, Snore and Nasal Pressure, Thermal Airflow, Respiratory Effort, CPAP Flow and Pressure, Oximetry was sampled at 50 Hz. Digital video and audio were recorded.      She was fitted with a F30 FFM. CPAP was initiated at 4 cm and titrated to a pressure of 15 cm. She had difficulty tolerating the higher pressure and therefore was switched to BiPAP for comfort, titrated from 15/11 cm to 16/12 cm. At the final pressure, her AHI was 3.4/hour with supine REM sleep achieved and O2 nadir of 87%.   Lights Out was at 22:34 and Lights On at 04:58. Total recording time (TRT) was 382 minutes, with a total sleep time (TST) of 296 minutes. The patient's sleep latency was 51 minutes, which is delayed. REM latency was 116.5 minutes, which is high normal. The sleep efficiency was 77.5%.    SLEEP ARCHITECTURE: WASO (Wake after sleep onset) was 56.5 minutes with mild to moderate sleep fragmentation noted. There were 38.5 minutes in Stage N1, 130.5 minutes Stage N2, 28 minutes  Stage N3 and 99 minutes in Stage REM.  The percentage of Stage N1 was 13.%, which is increased, Stage N2 was 44.1%, Stage N3 was 9.5% and Stage R (REM sleep) was 33.4%, which is increased and in keeping with rebound. The arousals were noted as: 16 were spontaneous, 0 were associated with PLMs, 2 were associated with respiratory events.  RESPIRATORY ANALYSIS:  There was a total of 66 respiratory events: 1 obstructive apneas, 2 central apneas and 0 mixed apneas with a total of 3 apneas and an apnea index (AI) of .6 /hour. There were 63 hypopneas with a hypopnea index of 12.8/hour. The patient also had 0 respiratory event related arousals (RERAs).      The total APNEA/HYPOPNEA INDEX  (AHI) was 13.4 /hour and the total RESPIRATORY DISTURBANCE INDEX was 13.4 /hour  53 events occurred in REM sleep and 13 events in NREM. The REM AHI was 32.1 /hour versus a non-REM AHI of 4. /hour.  The patient spent 136.5 minutes of total sleep time in the supine position and 160 minutes in non-supine. The supine AHI was 25.5, versus a non-supine AHI of 3.0.  OXYGEN SATURATION & C02:  The baseline 02 saturation was 94%, with the lowest being 76%. Time spent below 89% saturation equaled 51 minutes.  PERIODIC LIMB MOVEMENTS:  The patient had a total of 0 Periodic Limb Movements. The Periodic Limb Movement (PLM) index was 0 and the PLM Arousal index was 0 /hour.  Audio and video analysis did not  show any abnormal or unusual movements, behaviors, phonations or vocalizations. The patient took 1 bathroom break. The EKG was in keeping with normal sinus rhythm (NSR).  Post-study, the patient indicated that sleep was better than usual.   IMPRESSION: 1. Obstructive Sleep Apnea(OSA) 2. Dysfunctions associated with sleep stages or arousal from sleep   RECOMMENDATIONS: 1. This study demonstrates significant improvement of her OSA and mixed apnea with BiPAP. I will, therefore, start the patient on home BiPAP treatment at a pressure of  16/12 cm with heated humidity. The patient should be reminded to be fully compliant with PAP therapy to improve sleep related symptoms and decrease long term cardiovascular risks. The patient should be reminded, that it may take up to 3 months to get fully used to using PAP with all planned sleep. The earlier full compliance is achieved, the better long term compliance tends to be. Please note that untreated obstructive sleep apnea may carry additional perioperative morbidity. Patients with significant obstructive sleep apnea should receive perioperative PAP therapy and the surgeons and particularly the anesthesiologist should be informed of the diagnosis and the severity of the sleep disordered breathing. 2. This study shows sleep fragmentation and abnormal sleep stage percentages; these are nonspecific findings and per se do not signify an intrinsic sleep disorder or a cause for the patient's sleep-related symptoms. Causes include (but are not limited to) the first night effect of the sleep study, circadian rhythm disturbances, medication effect or an underlying mood disorder or medical problem.  3. The patient should be cautioned not to drive, work at heights, or operate dangerous or heavy equipment when tired or sleepy. Review and reiteration of good sleep hygiene measures should be pursued with any patient. 4. The patient will be seen in follow-up in the sleep clinic at [facility] for discussion of the test results, symptom and treatment compliance review, further management strategies, etc. The referring provider will be notified of the test results.   I certify that I have reviewed the entire raw data recording prior to the issuance of this report in accordance with the Standards of Accreditation of the American Academy of Sleep Medicine (AASM)     Star Age, MD, PhD Diplomat, American Board of Neurology and Sleep Medicine (Neurology and Sleep Medicine)

## 2019-04-19 NOTE — Telephone Encounter (Signed)
I called pt. I advised pt that Dr. Rexene Alberts reviewed their sleep study results and found that pt did well with a bipap during her latest sleep study. Dr. Rexene Alberts recommends that pt start a bipap at home. I reviewed PAP compliance expectations with the pt. Pt is agreeable to starting a BiPAP. I advised pt that an order will be sent to a DME, [facility], and [facility] will call the pt within about one week after they file with the pt's insurance. [facility] will show the pt how to use the machine, fit for masks, and troubleshoot the BiPAP if needed. A follow up appt was made for insurance purposes with Amy, NP on 07/04/19 at 10:00am. Pt verbalized understanding to arrive 15 minutes early and bring their BiPAP. A letter with all of this information in it will be mailed to the pt as a reminder. I verified with the pt that the address we have on file is correct. Pt verbalized understanding of results. Pt had no questions at this time but was encouraged to call back if questions arise. I have sent the order to [facility] and have received confirmation that they have received the order.

## 2019-04-19 NOTE — Telephone Encounter (Signed)
-----   Message from Star Age, MD sent at 04/19/2019  8:16 AM EDT ----- Patient referred by Dr. Nolon Rod, seen by me on 01/10/19, diagnostic PSG on 02/20/19. Patient had a CPAP titration study on 04/12/19.  Please call and inform patient that I have entered an order for treatment with positive airway pressure (PAP) treatment for obstructive sleep apnea (OSA). She did well during the latest sleep study with BiPAP. We will, therefore, arrange for a machine for home use through a DME (durable medical equipment) company of Her choice; and I will see the patient back in follow-up in about 10 weeks. Please also explain to the patient that I will be looking out for compliance data, which can be downloaded from the machine (stored on an SD card, that is inserted in the machine) or via remote access through a modem, that is built into the machine. At the time of the followup appointment we will discuss sleep study results and how it is going with PAP treatment at home. Please advise patient to bring Her machine at the time of the first FU visit, even though this is cumbersome. Bringing the machine for every visit after that will likely not be needed, but often helps for the first visit to troubleshoot if needed. Please re-enforce the importance of compliance with treatment and the need for [place] to monitor compliance data - often an insurance requirement and actually good feedback for the patient as far as how they are doing.  Also remind patient, that any interim PAP machine or mask issues should be first addressed with the DME company, as they can often help better with technical and mask fit issues. Please ask if patient has a preference regarding DME company.  Please also make sure, the patient has a follow-up appointment with me in about 10 weeks from the setup date, thanks. May see one of our nurse practitioners if needed for proper timing of the FU appointment.  Please fax or rout report to the referring provider.  Thanks,   Star Age, MD, PhD [facility])

## 2019-04-19 NOTE — Progress Notes (Signed)
cpap

## 2019-04-19 NOTE — Addendum Note (Signed)
Addended by: Star Age on: 04/19/2019 08:16 AM   Modules accepted: Orders

## 2019-04-19 NOTE — Progress Notes (Signed)
Patient referred by Dr. Nolon Rod, seen by me on 01/10/19, diagnostic PSG on 02/20/19. Patient had a CPAP titration study on 04/12/19.  Please call and inform patient that I have entered an order for treatment with positive airway pressure (PAP) treatment for obstructive sleep apnea (OSA). She did well during the latest sleep study with BiPAP. We will, therefore, arrange for a machine for home use through a DME (durable medical equipment) company of Her choice; and I will see the patient back in follow-up in about 10 weeks. Please also explain to the patient that I will be looking out for compliance data, which can be downloaded from the machine (stored on an SD card, that is inserted in the machine) or via remote access through a modem, that is built into the machine. At the time of the followup appointment we will discuss sleep study results and how it is going with PAP treatment at home. Please advise patient to bring Her machine at the time of the first FU visit, even though this is cumbersome. Bringing the machine for every visit after that will likely not be needed, but often helps for the first visit to troubleshoot if needed. Please re-enforce the importance of compliance with treatment and the need for [place] to monitor compliance data - often an insurance requirement and actually good feedback for the patient as far as how they are doing.  Also remind patient, that any interim PAP machine or mask issues should be first addressed with the DME company, as they can often help better with technical and mask fit issues. Please ask if patient has a preference regarding DME company.  Please also make sure, the patient has a follow-up appointment with me in about 10 weeks from the setup date, thanks. May see one of our nurse practitioners if needed for proper timing of the FU appointment.  Please fax or rout report to the referring provider. Thanks,   Star Age, MD, PhD [facility])

## 2019-05-25 DIAGNOSIS — G4733 Obstructive sleep apnea (adult) (pediatric): Secondary | ICD-10-CM | POA: Diagnosis not present

## 2019-06-04 ENCOUNTER — Telehealth: Payer: Self-pay | Admitting: *Deleted

## 2019-06-04 NOTE — Telephone Encounter (Signed)
Schedule AWV.  

## 2019-06-22 ENCOUNTER — Ambulatory Visit (INDEPENDENT_AMBULATORY_CARE_PROVIDER_SITE_OTHER): Payer: Medicare Other | Admitting: Family Medicine

## 2019-06-22 ENCOUNTER — Other Ambulatory Visit: Payer: Self-pay

## 2019-06-22 ENCOUNTER — Encounter: Payer: Self-pay | Admitting: Family Medicine

## 2019-06-22 VITALS — BP 152/76 | HR 62 | Temp 98.9°F | Resp 12 | Ht 62.0 in | Wt 234.0 lb

## 2019-06-22 DIAGNOSIS — D508 Other iron deficiency anemias: Secondary | ICD-10-CM | POA: Diagnosis not present

## 2019-06-22 DIAGNOSIS — E039 Hypothyroidism, unspecified: Secondary | ICD-10-CM

## 2019-06-22 DIAGNOSIS — R6889 Other general symptoms and signs: Secondary | ICD-10-CM | POA: Diagnosis not present

## 2019-06-22 DIAGNOSIS — J029 Acute pharyngitis, unspecified: Secondary | ICD-10-CM | POA: Diagnosis not present

## 2019-06-22 LAB — POCT CBC
Granulocyte percent: 67.4 %G (ref 37–80)
HCT, POC: 33.9 % (ref 29–41)
Hemoglobin: 11.4 g/dL (ref 11–14.6)
Lymph, poc: 1.9 (ref 0.6–3.4)
MCH, POC: 26 pg — AB (ref 27–31.2)
MCHC: 33.6 g/dL (ref 31.8–35.4)
MCV: 77.5 fL (ref 76–111)
MID (cbc): 0.4 (ref 0–0.9)
MPV: 6.3 fL (ref 0–99.8)
POC Granulocyte: 4.7 (ref 2–6.9)
POC LYMPH PERCENT: 27.5 %L (ref 10–50)
POC MID %: 58.1 %M — AB (ref 0–12)
Platelet Count, POC: 356 10*3/uL — AB (ref 142–424)
RBC: 4.37 M/uL (ref 4.04–5.48)
RDW, POC: 14.4 %
WBC: 6.9 10*3/uL (ref 4.6–10.2)

## 2019-06-22 MED ORDER — SLOW FE 142 (45 FE) MG PO TBCR: 1.0000 | EXTENDED_RELEASE_TABLET | Freq: Every day | ORAL | 3 refills | Status: DC

## 2019-06-22 NOTE — Patient Instructions (Signed)
Iron Deficiency Anemia, Adult Iron deficiency anemia is a condition in which the concentration of red blood cells or hemoglobin in the blood is below normal because of too little iron. Hemoglobin is a substance in red blood cells that carries oxygen to the body's tissues. When the concentration of red blood cells or hemoglobin is too low, not enough oxygen reaches these tissues. Iron deficiency anemia is usually long-lasting (chronic) and it develops over time. It may or may not cause symptoms. It is a common type of anemia. What are the causes? This condition may be caused by:  Not enough iron in the diet.  Blood loss caused by bleeding in the intestine.  Blood loss from a gastrointestinal condition like Crohn disease.  Frequent blood draws, such as from blood donation.  Abnormal absorption in the gut.  Heavy menstrual periods in women.  Cancers of the gastrointestinal system, such as colon cancer. What are the signs or symptoms? Symptoms of this condition may include:  Fatigue.  Headache.  Pale skin, lips, and nail beds.  Poor appetite.  Weakness.  Shortness of breath.  Dizziness.  Cold hands and feet.  Fast or irregular heartbeat.  Irritability. This is more common in severe anemia.  Rapid breathing. This is more common in severe anemia. Mild anemia may not cause any symptoms. How is this diagnosed? This condition is diagnosed based on:  Your medical history.  A physical exam.  Blood tests. You may have additional tests to find the underlying cause of your anemia, such as:  Testing for blood in the stool (fecal occult blood test).  A procedure to see inside your colon and rectum (colonoscopy).  A procedure to see inside your esophagus and stomach (endoscopy).  A test in which cells are removed from bone marrow (bone marrow aspiration) or fluid is removed from the bone marrow to be examined (biopsy). This is rarely needed. How is this treated? This  condition is treated by correcting the cause of your iron deficiency. Treatment may involve:  Adding iron-rich foods to your diet.  Taking iron supplements. If you are pregnant or breastfeeding, you may need to take extra iron because your normal diet usually does not provide the amount of iron that you need.  Increasing vitamin C intake. Vitamin C helps your body absorb iron. Your health care provider may recommend that you take iron supplements along with a glass of orange juice or a vitamin C supplement.  Medicines to make heavy menstrual flow lighter.  Surgery. You may need repeat blood tests to determine whether treatment is working. Depending on the underlying cause, the anemia should be corrected within 2 months of starting treatment. If the treatment does not seem to be working, you may need more testing. Follow these instructions at home: Medicines  Take over-the-counter and prescription medicines only as told by your health care provider. This includes iron supplements and vitamins.  If you cannot tolerate taking iron supplements by mouth, talk with your health care provider about taking them through a vein (intravenously) or an injection into a muscle.  For the best iron absorption, you should take iron supplements when your stomach is empty. If you cannot tolerate them on an empty stomach, you may need to take them with food.  Do not drink milk or take antacids at the same time as your iron supplements. Milk and antacids may interfere with iron absorption.  Iron supplements can cause constipation. To prevent constipation, include fiber in your diet as told   by your health care provider. A stool softener may also be recommended. Eating and drinking   Talk with your health care provider before changing your diet. He or she may recommend that you eat foods that contain a lot of iron, such as: ? Liver. ? Low-fat (lean) beef. ? Breads and cereals that have iron added to them (are  fortified). ? Eggs. ? Dried fruit. ? Dark green, leafy vegetables.  To help your body use the iron from iron-rich foods, eat those foods at the same time as fresh fruits and vegetables that are high in vitamin C. Foods that are high in vitamin C include: ? Oranges. ? Peppers. ? Tomatoes. ? Mangoes.  Drinkenoughfluid to keep your urine clear or pale yellow. General instructions  Return to your normal activities as told by your health care provider. Ask your health care provider what activities are safe for you.  Practice good hygiene. Anemia can make you more prone to illness and infection.  Keep all follow-up visits as told by your health care provider. This is important. Contact a health care provider if:  You feel nauseous or you vomit.  You feel weak.  You have unexplained sweating.  You develop symptoms of constipation, such as: ? Having fewer than three bowel movements a week. ? Straining to have a bowel movement. ? Having stools that are hard, dry, or larger than normal. ? Feeling full or bloated. ? Pain in the lower abdomen. ? Not feeling relief after having a bowel movement. Get help right away if:  You faint. If this happens, do not drive yourself to the hospital. Call your local emergency services (911 in the [place]  You have chest pain.  You have shortness of breath that: ? Is severe. ? Gets worse with physical activity.  You have a rapid heartbeat.  You become light-headed when getting up from a sitting or lying down position. This information is not intended to replace advice given to you by your health care provider. Make sure you discuss any questions you have with your health care provider. Document Released: 07/30/2000 Document Revised: 07/15/2017 Document Reviewed: 04/21/2016 Elsevier Patient Education  2020 [employer]  

## 2019-06-22 NOTE — Progress Notes (Signed)
Established Patient Office Visit  Subjective:  Patient ID: Alyssa Jefferson, female    DOB: 01/24/1946  Age: 73 y.o. MRN: [id number]  CC:  Chief Complaint  Patient presents with  . Neck Pain    Pt stated --left side neck is sore, itching, rash--3 months. Denied fever.    HPI ILANNA Jefferson presents for   Sore throat Sore throat that is also causing her neck to feel sore on the left side She denies fevers She has been checked for covid 3 other times She wears her mask There is no neck stiffness No nausea or vomiting She denies pain is with swallowing +intermittent cough   Hypothyroidism She also reports that she can just fall asleep so quickly If she is sitting comfortably She feels tired  She is also feeling cold more than before She has a history of hypothyroidism Lab Results  Component Value Date   TSH 1.640 01/01/2019     Past Medical History:  Diagnosis Date  . Arthritis    knees, back  . Cancer [facility]) 2004   breast  . Depression   . GERD (gastroesophageal reflux disease)   . Glaucoma   . Heart palpitations   . High cholesterol   . History of transfusion    many years ago  . Hypertension   . Hypothyroidism   . Itching in the vaginal area   . Nocturia   . OSA (obstructive sleep apnea)    tried CPAP- but unsuccessful  . Renal disorder   . Thyroid disease    thyroid removed    Past Surgical History:  Procedure Laterality Date  . ABDOMINAL HYSTERECTOMY  82   BSO  . BREAST BIOPSY Right pt doesnt remember   benign  . BREAST SURGERY  2004   left breast removed due to cancer no chemo or radiation  . MASTECTOMY Left   . THYROIDECTOMY     due to knot  . TOTAL KNEE ARTHROPLASTY Left 07/28/2015   Procedure: LEFT TOTAL KNEE ARTHROPLASTY;  Surgeon: Gaynelle Arabian, MD;  Location: [facility];  Service: Orthopedics;  Laterality: Left;    Family History  Problem Relation Age of Onset  . Lung cancer Father   . Breast cancer Sister   . Ovarian cancer Sister    . Colon cancer Neg Hx   . Esophageal cancer Neg Hx   . Stomach cancer Neg Hx   . Pancreatic cancer Neg Hx   . Liver disease Neg Hx     Social History   Socioeconomic History  . Marital status: Widowed    Spouse name: Not on file  . Number of children: 5  . Years of education: Not on file  . Highest education level: Not on file  Occupational History  . Occupation: retired    Fish farm manager: RETIRED  Social Needs  . Financial resource strain: Not on file  . Food insecurity    Worry: Not on file    Inability: Not on file  . Transportation needs    Medical: Not on file    Non-medical: Not on file  Tobacco Use  . Smoking status: Former Smoker    Years: 20.00    Types: Cigarettes    Quit date: 08/16/1978    Years since quitting: 40.8  . Smokeless tobacco: Never Used  . Tobacco comment: 1 pack per week--10/16/12  Substance and Sexual Activity  . Alcohol use: No  . Drug use: No  . Sexual activity: Not Currently  Lifestyle  .  Physical activity    Days per week: Not on file    Minutes per session: Not on file  . Stress: Not on file  Relationships  . Social Herbalist on phone: Not on file    Gets together: Not on file    Attends religious service: Not on file    Active member of club or organization: Not on file    Attends meetings of clubs or organizations: Not on file    Relationship status: Not on file  . Intimate partner violence    Fear of current or ex partner: Not on file    Emotionally abused: Not on file    Physically abused: Not on file    Forced sexual activity: Not on file  Other Topics Concern  . Not on file  Social History Narrative  . Not on file    Outpatient Medications Prior to Visit  Medication Sig Dispense Refill  . albuterol (VENTOLIN HFA) 108 (90 Base) MCG/ACT inhaler Inhale 2 puffs into the lungs every 6 (six) hours as needed for wheezing or shortness of breath. 1 Inhaler 0  . cetirizine (ZYRTEC) 10 MG tablet Take 1 tablet (10 mg total)  by mouth daily. 30 tablet 11  . diltiazem (CARDIZEM CD) 180 MG 24 hr capsule Take 1 capsule (180 mg total) by mouth daily. 90 capsule 1  . esomeprazole (NEXIUM) 40 MG capsule TAKE 1 CAPSULE BY MOUTH ONCE DAILY AT NOON 90 capsule 1  . latanoprost (XALATAN) 0.005 % ophthalmic solution Place 1 drop into both eyes at bedtime. 2.5 mL 6  . levothyroxine (SYNTHROID) 50 MCG tablet Take 1 tablet (50 mcg total) by mouth daily. 90 tablet 3  . rosuvastatin (CRESTOR) 20 MG tablet Take 1 tablet (20 mg total) by mouth at bedtime. 90 tablet 3  . spironolactone (ALDACTONE) 50 MG tablet TAKE 1 TABLET(50 MG) BY MOUTH DAILY 90 tablet 1   No facility-administered medications prior to visit.     Allergies  Allergen Reactions  . Codeine Other (See Comments)    confusion  . Lipitor [Atorvastatin Calcium] Itching    ROS Review of Systems Review of Systems  Genitourinary: Negative for difficulty urinating, dysuria, flank pain and hematuria.  Musculoskeletal: Negative for back pain, joint swelling and neck pain.  Neurological: Negative for dizziness, speech difficulty, light-headedness and numbness.  See HPI. All other review of systems negative.     Objective:    Physical Exam  BP (!) 160/68 (BP Location: Right Arm, Patient Position: Sitting, Cuff Size: Normal)   Pulse 62   Temp 98.9 F (37.2 C)   Resp 12   Ht 5\' 2"  (1.575 m)   Wt 234 lb (106.1 kg)   SpO2 99%   BMI 42.80 kg/m  Wt Readings from Last 3 Encounters:  06/22/19 234 lb (106.1 kg)  01/10/19 233 lb (105.7 kg)  01/01/19 230 lb (104.3 kg)   General: alert, oriented, in NAD Head: normocephalic, atraumatic, no sinus tenderness Eyes: EOM intact, no scleral icterus or conjunctival injection Ears: TM clear bilaterally Nose: mucosa nonerythematous, nonedematous Neck: supple, tender of the SCM on the left, no palpable LAD, no thyromegaly Lymph: no posterior auricular, submental or cervical lymph adenopathy Heart: normal rate, normal sinus  rhythm, no murmurs Lungs: clear to auscultation bilaterally, no wheezing    There are no preventive care reminders to display for this patient.  There are no preventive care reminders to display for this patient.  Lab Results  Component  Value Date   TSH 1.640 01/01/2019   Lab Results  Component Value Date   WBC 7.2 06/08/2018   HGB 12.1 06/08/2018   HCT 38.0 06/08/2018   MCV 81 06/08/2018   PLT 347 06/08/2018   Lab Results  Component Value Date   NA 141 01/01/2019   K 4.5 01/01/2019   CO2 23 01/01/2019   GLUCOSE 86 01/01/2019   BUN 18 01/01/2019   CREATININE 1.33 (H) 01/01/2019   BILITOT 0.4 01/01/2019   ALKPHOS 86 01/01/2019   AST 13 01/01/2019   ALT 12 01/01/2019   PROT 7.3 01/01/2019   ALBUMIN 4.2 01/01/2019   CALCIUM 9.6 01/01/2019   ANIONGAP 8 01/11/2016   Lab Results  Component Value Date   CHOL 205 (H) 01/01/2019   Lab Results  Component Value Date   HDL 65 01/01/2019   Lab Results  Component Value Date   LDLCALC 118 (H) 01/01/2019   Lab Results  Component Value Date   TRIG 112 01/01/2019   Lab Results  Component Value Date   CHOLHDL 3.2 01/01/2019   Lab Results  Component Value Date   HGBA1C 5.7 (H) 09/20/2016      Assessment & Plan:   Problem List Items Addressed This Visit    None    Visit Diagnoses    Cold intolerance    -  Primary Will check iron and thyroid   Relevant Orders   POCT CBC   TSH   Sore throat    -  Will rule out covid   Relevant Orders   POCT CBC   Novel Coronavirus, NAA ([facility])        Hypothyroidism - thyroid stable, pt is compliant  No orders of the defined types were placed in this encounter.   Follow-up: No follow-ups on file.    Forrest Moron, MD

## 2019-06-23 LAB — TSH: TSH: 1.14 u[IU]/mL (ref 0.450–4.500)

## 2019-06-24 LAB — NOVEL CORONAVIRUS, NAA: SARS-CoV-2, NAA: NOT DETECTED

## 2019-06-25 DIAGNOSIS — G4733 Obstructive sleep apnea (adult) (pediatric): Secondary | ICD-10-CM | POA: Diagnosis not present

## 2019-06-29 ENCOUNTER — Telehealth: Payer: Self-pay | Admitting: Neurology

## 2019-06-29 NOTE — Telephone Encounter (Signed)
Pt is asking for a call from RN.  Pt cancelled her appointment.  She has only been able to use the bi pap once.  Pt unsure as to what she is doing wrong.  Pt is asking for RN to call her

## 2019-07-02 ENCOUNTER — Ambulatory Visit: Payer: Medicare Other | Admitting: Family Medicine

## 2019-07-02 NOTE — Telephone Encounter (Signed)
I called pt. She reports that she does not understand how to use her bipap. She had an appt with [facility] last week to discuss this further but it rained that day and she was afraid to drive. I encouraged her to call [place] back and reschedule that appt. I gave her [facility] phone number. She started bipap on 05/25/19 so her follow up needs to be after 07/25/19 but before 08/25/19. Her appt was rescheduled for 08/13/19 at 1:00pm with Amy, NP. Pt verbalized understanding of recommendations and of new appt date and time. I will also reach out to [facility].

## 2019-07-04 ENCOUNTER — Ambulatory Visit: Payer: Self-pay | Admitting: Family Medicine

## 2019-07-09 ENCOUNTER — Other Ambulatory Visit: Payer: Self-pay | Admitting: Family Medicine

## 2019-07-13 ENCOUNTER — Telehealth: Payer: Self-pay | Admitting: Family Medicine

## 2019-07-13 NOTE — Telephone Encounter (Signed)
Pt wants to talk to provider pertaining a medication that is not on the chart   Please advise  [phone]  Out of medication

## 2019-07-13 NOTE — Telephone Encounter (Signed)
I am not sure which medication. [name], can you call the patient please.

## 2019-07-17 ENCOUNTER — Encounter: Payer: Self-pay | Admitting: Family Medicine

## 2019-07-17 ENCOUNTER — Ambulatory Visit (INDEPENDENT_AMBULATORY_CARE_PROVIDER_SITE_OTHER): Payer: Medicare Other | Admitting: Family Medicine

## 2019-07-17 ENCOUNTER — Other Ambulatory Visit: Payer: Self-pay

## 2019-07-17 VITALS — BP 145/70 | HR 63 | Temp 97.8°F | Resp 16 | Ht 62.0 in | Wt 230.2 lb

## 2019-07-17 DIAGNOSIS — F419 Anxiety disorder, unspecified: Secondary | ICD-10-CM

## 2019-07-17 DIAGNOSIS — F32A Depression, unspecified: Secondary | ICD-10-CM

## 2019-07-17 DIAGNOSIS — F329 Major depressive disorder, single episode, unspecified: Secondary | ICD-10-CM

## 2019-07-17 MED ORDER — VENLAFAXINE HCL ER 37.5 MG PO CP24: 37.5000 mg | ORAL_CAPSULE | Freq: Every day | ORAL | 3 refills | Status: DC

## 2019-07-17 NOTE — Progress Notes (Signed)
Established Patient Office Visit  Subjective:  Patient ID: Alyssa Jefferson, female    DOB: 1945-12-06  Age: 73 y.o. MRN: [id number]  CC:  Chief Complaint  Patient presents with  . medication needed for her nerves    depression score: 11 and GAD 7 score: 8    HPI Alyssa Jefferson presents for  She reports that 5 days ago she had a sense of sadness out of nowhere. She states that she knew that was a red flag. She has been crying a lot  The other day she felt jittery  This has been the firs time with the jitteriness. She has been crying for months and the least little thing sets you off. She states that since the coronavirus pandemic her mood has just been worse.  She denies suicidal thoughts She feels overwhelmed  She interacts with her daughter by phone or in person.    Past Medical History:  Diagnosis Date  . Arthritis    knees, back  . Cancer [facility]) 2004   breast  . Depression   . GERD (gastroesophageal reflux disease)   . Glaucoma   . Heart palpitations   . High cholesterol   . History of transfusion    many years ago  . Hypertension   . Hypothyroidism   . Itching in the vaginal area   . Nocturia   . OSA (obstructive sleep apnea)    tried CPAP- but unsuccessful  . Renal disorder   . Thyroid disease    thyroid removed    Past Surgical History:  Procedure Laterality Date  . ABDOMINAL HYSTERECTOMY  82   BSO  . BREAST BIOPSY Right pt doesnt remember   benign  . BREAST SURGERY  2004   left breast removed due to cancer no chemo or radiation  . MASTECTOMY Left   . THYROIDECTOMY     due to knot  . TOTAL KNEE ARTHROPLASTY Left 07/28/2015   Procedure: LEFT TOTAL KNEE ARTHROPLASTY;  Surgeon: Gaynelle Arabian, MD;  Location: [facility];  Service: Orthopedics;  Laterality: Left;    Family History  Problem Relation Age of Onset  . Lung cancer Father   . Breast cancer Sister   . Ovarian cancer Sister   . Colon cancer Neg Hx   . Esophageal cancer Neg Hx   . Stomach  cancer Neg Hx   . Pancreatic cancer Neg Hx   . Liver disease Neg Hx     Social History   Socioeconomic History  . Marital status: Widowed    Spouse name: Not on file  . Number of children: 5  . Years of education: Not on file  . Highest education level: Not on file  Occupational History  . Occupation: retired    Fish farm manager: RETIRED  Social Needs  . Financial resource strain: Not on file  . Food insecurity    Worry: Not on file    Inability: Not on file  . Transportation needs    Medical: Not on file    Non-medical: Not on file  Tobacco Use  . Smoking status: Former Smoker    Years: 20.00    Types: Cigarettes    Quit date: 08/16/1978    Years since quitting: 40.9  . Smokeless tobacco: Never Used  . Tobacco comment: 1 pack per week--10/16/12  Substance and Sexual Activity  . Alcohol use: No  . Drug use: No  . Sexual activity: Not Currently  Lifestyle  . Physical activity  Days per week: Not on file    Minutes per session: Not on file  . Stress: Not on file  Relationships  . Social Herbalist on phone: Not on file    Gets together: Not on file    Attends religious service: Not on file    Active member of club or organization: Not on file    Attends meetings of clubs or organizations: Not on file    Relationship status: Not on file  . Intimate partner violence    Fear of current or ex partner: Not on file    Emotionally abused: Not on file    Physically abused: Not on file    Forced sexual activity: Not on file  Other Topics Concern  . Not on file  Social History Narrative  . Not on file    Outpatient Medications Prior to Visit  Medication Sig Dispense Refill  . albuterol (VENTOLIN HFA) 108 (90 Base) MCG/ACT inhaler Inhale 2 puffs into the lungs every 6 (six) hours as needed for wheezing or shortness of breath. 1 Inhaler 0  . cetirizine (ZYRTEC) 10 MG tablet Take 1 tablet (10 mg total) by mouth daily. 30 tablet 11  . diltiazem (CARDIZEM CD) 180 MG 24  hr capsule TAKE 1 CAPSULE(180 MG) BY MOUTH DAILY 90 capsule 1  . esomeprazole (NEXIUM) 40 MG capsule TAKE 1 CAPSULE BY MOUTH ONCE DAILY AT NOON 90 capsule 1  . Ferrous Sulfate (SLOW FE) 142 (45 Fe) MG TBCR Take 1 tablet by mouth daily. 30 tablet 3  . latanoprost (XALATAN) 0.005 % ophthalmic solution Place 1 drop into both eyes at bedtime. 2.5 mL 6  . levothyroxine (SYNTHROID) 50 MCG tablet Take 1 tablet (50 mcg total) by mouth daily. 90 tablet 3  . rosuvastatin (CRESTOR) 20 MG tablet Take 1 tablet (20 mg total) by mouth at bedtime. 90 tablet 3  . spironolactone (ALDACTONE) 50 MG tablet TAKE 1 TABLET(50 MG) BY MOUTH DAILY 90 tablet 1   No facility-administered medications prior to visit.     Allergies  Allergen Reactions  . Codeine Other (See Comments)    confusion  . Lipitor [Atorvastatin Calcium] Itching    ROS Review of Systems    Objective:    Physical Exam  BP (!) 145/70 (BP Location: Right Arm, Patient Position: Sitting, Cuff Size: Large)   Pulse 63   Temp 97.8 F (36.6 C) (Oral)   Resp 16   Ht 5\' 2"  (1.575 m)   Wt 230 lb 3.2 oz (104.4 kg)   SpO2 98%   BMI 42.10 kg/m  Wt Readings from Last 3 Encounters:  07/17/19 230 lb 3.2 oz (104.4 kg)  06/22/19 234 lb (106.1 kg)  01/10/19 233 lb (105.7 kg)    Physical Exam  Constitutional: Oriented to person, place, and time. Appears well-developed and well-nourished.  HENT:  Head: Normocephalic and atraumatic.  Eyes: Conjunctivae and EOM are normal.  Cardiovascular: Normal rate, regular rhythm, normal heart sounds and intact distal pulses.  No murmur heard. Pulmonary/Chest: Effort normal and breath sounds normal. No respiratory distress.  Neurological: Is alert and oriented to person, place, and time.  Skin: Skin is warm. Capillary refill takes less than 2 seconds.  Psychiatric: Has a normal mood and affect. Behavior is normal. Judgment and thought content normal.   There are no preventive care reminders to display for  this patient.  There are no preventive care reminders to display for this patient.  Lab Results  Component Value Date   TSH 1.140 06/22/2019   Lab Results  Component Value Date   WBC 6.9 06/22/2019   HGB 11.4 06/22/2019   HCT 33.9 06/22/2019   MCV 77.5 06/22/2019   PLT 347 06/08/2018   Lab Results  Component Value Date   NA 141 01/01/2019   K 4.5 01/01/2019   CO2 23 01/01/2019   GLUCOSE 86 01/01/2019   BUN 18 01/01/2019   CREATININE 1.33 (H) 01/01/2019   BILITOT 0.4 01/01/2019   ALKPHOS 86 01/01/2019   AST 13 01/01/2019   ALT 12 01/01/2019   PROT 7.3 01/01/2019   ALBUMIN 4.2 01/01/2019   CALCIUM 9.6 01/01/2019   ANIONGAP 8 01/11/2016   Lab Results  Component Value Date   CHOL 205 (H) 01/01/2019   Lab Results  Component Value Date   HDL 65 01/01/2019   Lab Results  Component Value Date   LDLCALC 118 (H) 01/01/2019   Lab Results  Component Value Date   TRIG 112 01/01/2019   Lab Results  Component Value Date   CHOLHDL 3.2 01/01/2019   Lab Results  Component Value Date   HGBA1C 5.7 (H) 09/20/2016      Assessment & Plan:   Problem List Items Addressed This Visit    None    Visit Diagnoses    Anxiety and depression    -  Primary   Relevant Medications   venlafaxine XR (EFFEXOR-XR) 37.5 MG 24 hr capsule     Patient was advised to resume exercise She was prescribed effexor which was the last medication she took about 2 years ago Referral placed for counseling Discussed risks and adverse reactions     Meds ordered this encounter  Medications  . venlafaxine XR (EFFEXOR-XR) 37.5 MG 24 hr capsule    Sig: Take 1 capsule (37.5 mg total) by mouth daily with breakfast.    Dispense:  30 capsule    Refill:  3    Follow-up: No follow-ups on file.    Forrest Moron, MD

## 2019-07-17 NOTE — Telephone Encounter (Signed)
Pt seen in office today and issue addressed at visit.

## 2019-07-17 NOTE — Patient Instructions (Signed)
° ° ° °  If you have lab work done today you will be contacted with your lab results within the next 2 weeks.  If you have not heard from us then please contact us. The fastest way to get your results is to register for My Chart. ° ° °IF you received an x-ray today, you will receive an invoice from [facility]. Please contact [facility] at [phone] with questions or concerns regarding your invoice.  ° °IF you received labwork today, you will receive an invoice from [facility]. Please contact [facility] at [phone] with questions or concerns regarding your invoice.  ° °Our billing staff will not be able to assist you with questions regarding bills from these companies. ° °You will be contacted with the lab results as soon as they are available. The fastest way to get your results is to activate your My Chart account. Instructions are located on the last page of this paperwork. If you have not heard from us regarding the results in 2 weeks, please contact this office. °  ° ° ° °

## 2019-07-24 ENCOUNTER — Telehealth: Payer: Self-pay | Admitting: *Deleted

## 2019-07-24 NOTE — Telephone Encounter (Signed)
Schedule AWV.  

## 2019-07-25 DIAGNOSIS — G4733 Obstructive sleep apnea (adult) (pediatric): Secondary | ICD-10-CM | POA: Diagnosis not present

## 2019-07-30 ENCOUNTER — Telehealth: Payer: Self-pay

## 2019-07-30 NOTE — Telephone Encounter (Signed)
Fax came in from [facility] for referral conformation. Faxed to [facility]

## 2019-08-13 ENCOUNTER — Ambulatory Visit: Payer: Self-pay | Admitting: Family Medicine

## 2019-08-22 IMAGING — US ULTRASOUND ABDOMEN LIMITED
1 series · 14 of 25 positions shown · non-contrast
Comparison: January 25, 2017.

CLINICAL DATA: Right upper quadrant pain

EXAM:
ULTRASOUND ABDOMEN LIMITED RIGHT UPPER QUADRANT

[Series 1: ultrasound abdomen limited · 0.17mm/px · 14 of 50 slices shown]
[im 1/50]
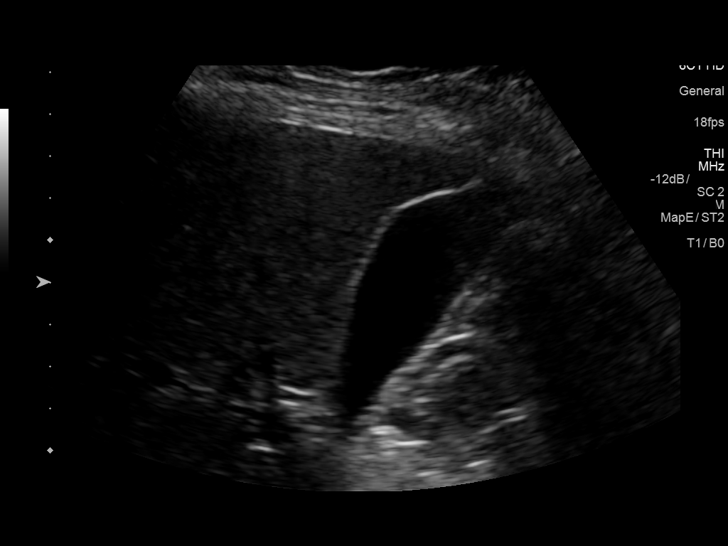
[im 5/50]
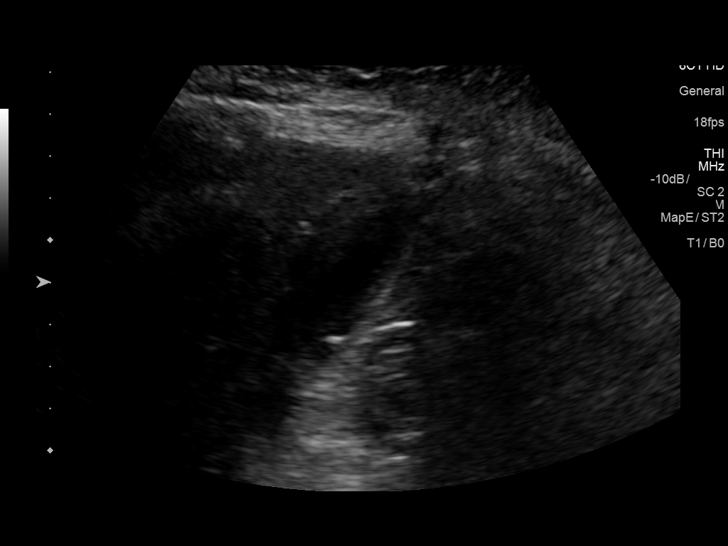
[im 9/50]
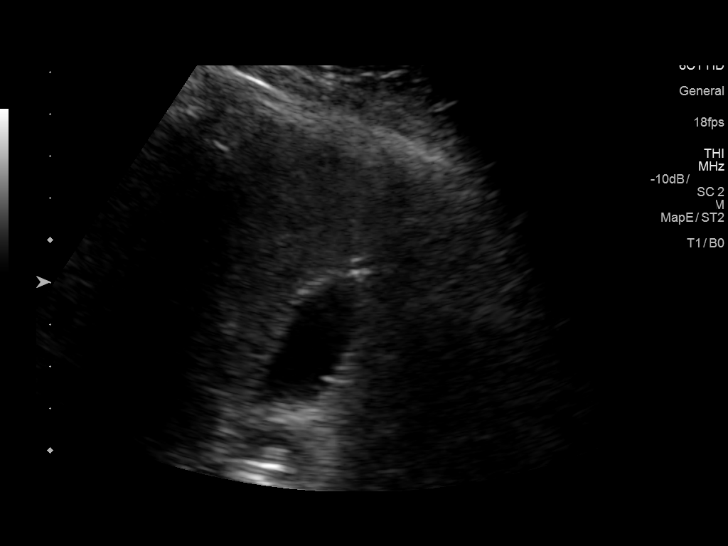
[im 13/50]
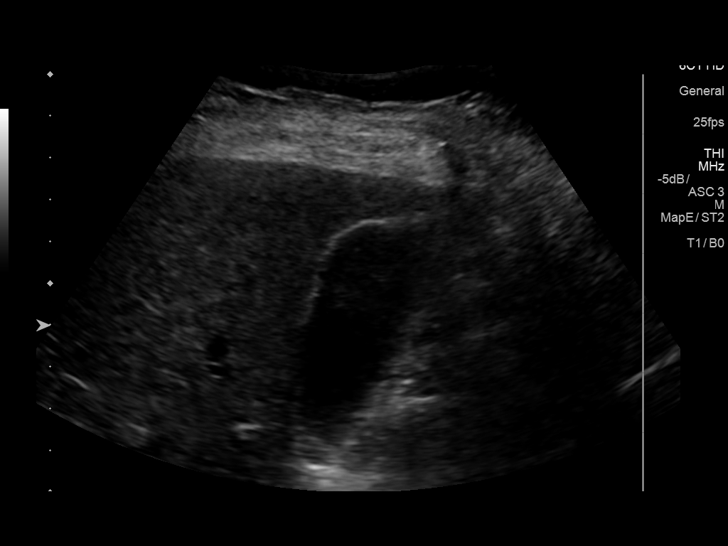
[im 17/50]
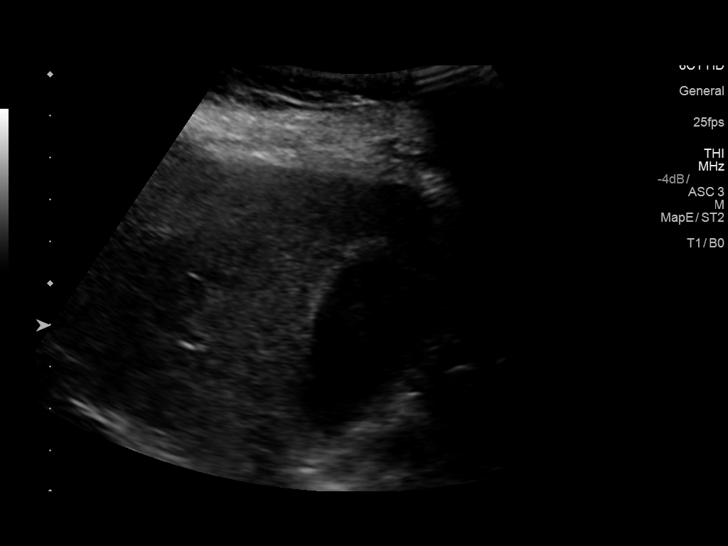
[im 19/50]
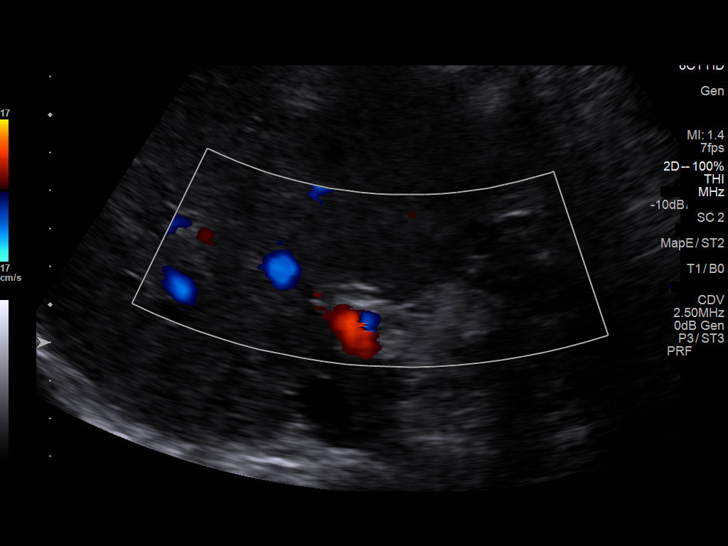
[im 23/50]
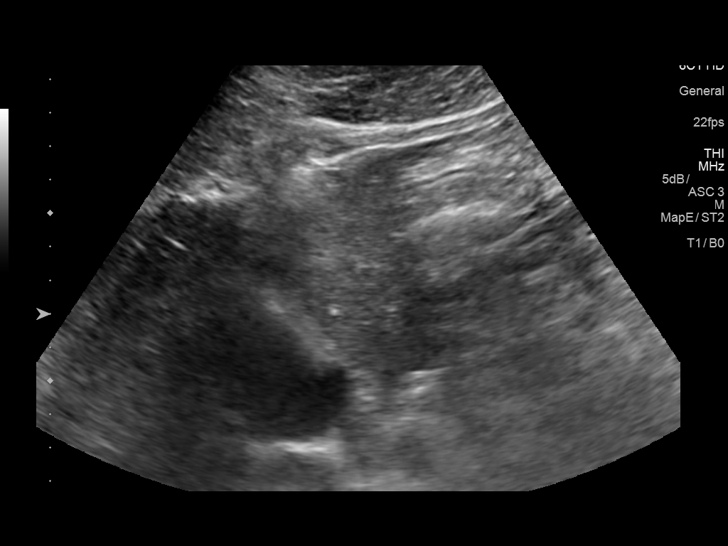
[im 27/50]
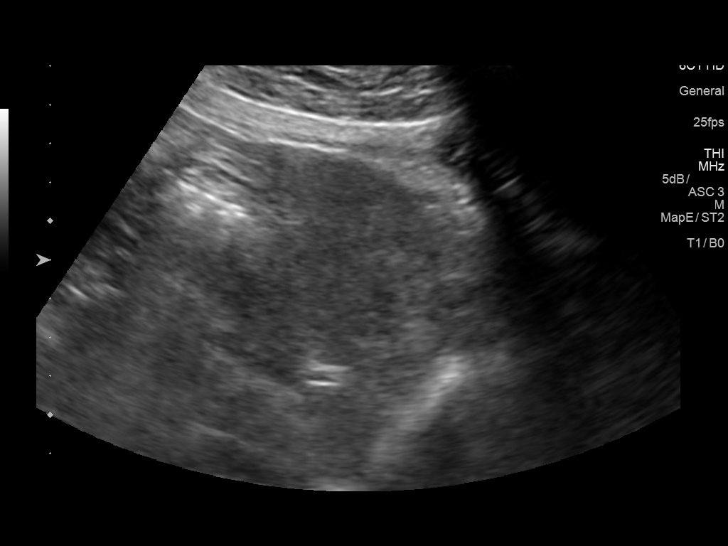
[im 31/50]
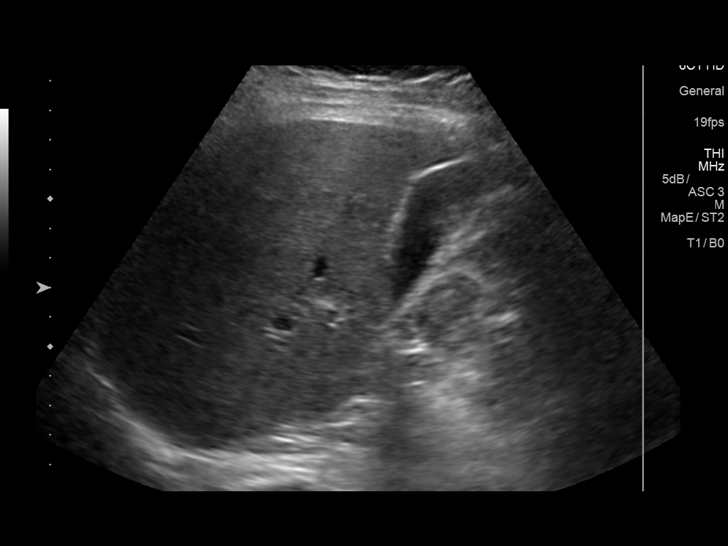
[im 33/50]
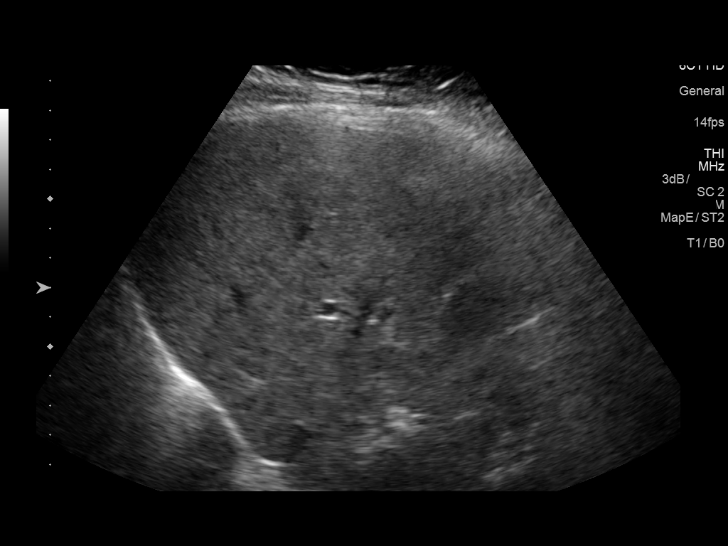
[im 37/50]
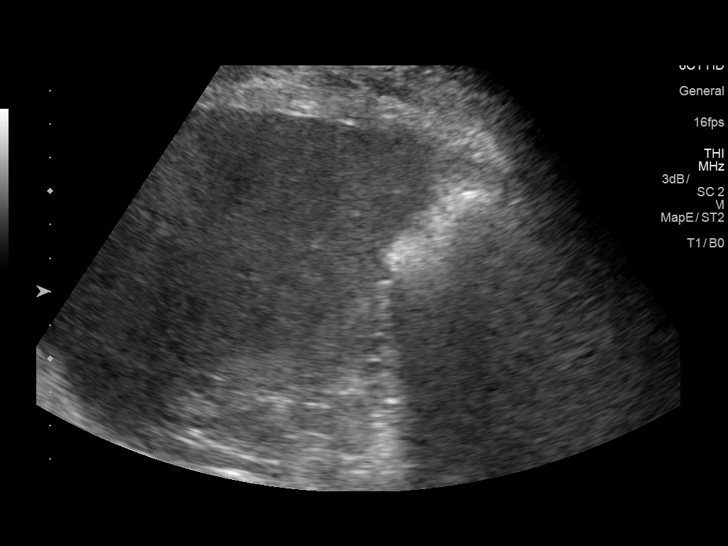
[im 41/50]
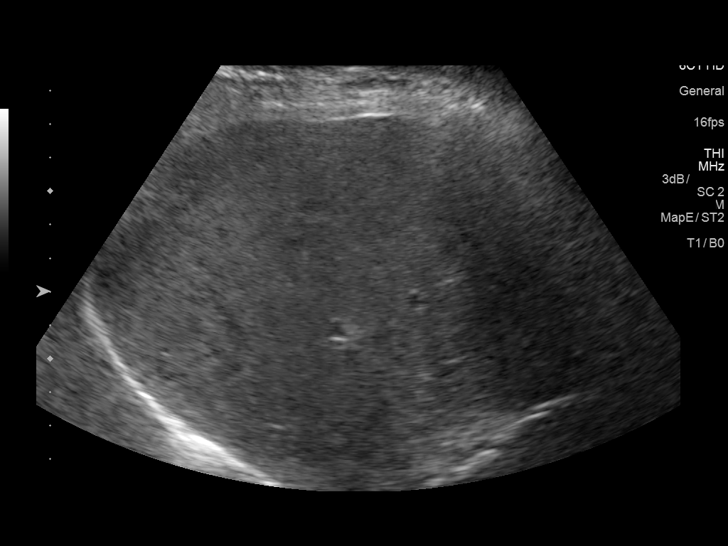
[im 45/50]
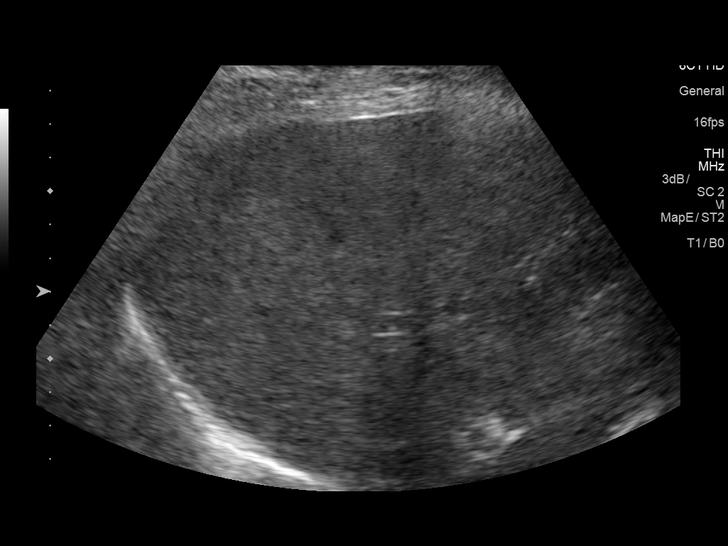
[im 50/50]
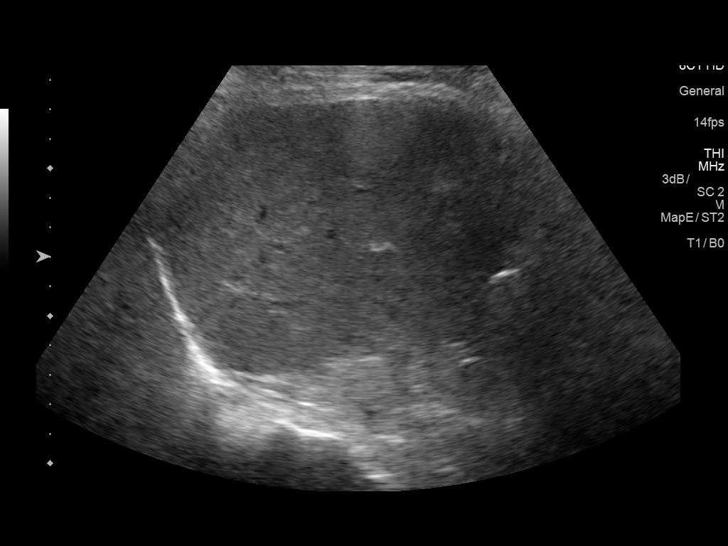

[14 of 25 positions shown; findings below may reference images not displayed]

FINDINGS: Gallbladder:

There is a 5 mm echogenic focus along the wall of the gallbladder
which neither moves nor shadows, a presumed small polyp. There are
no echogenic foci in the gallbladder which move and shadow as is
expected with cholelithiasis. There is no apparent gallbladder wall
thickening or pericholecystic fluid. No sonographic Murphy sign
noted by sonographer.

Common bile duct:

Diameter: 4 mm. No intrahepatic or extrahepatic biliary duct
dilatation.

Liver:

No focal lesion identified. Liver echogenicity overall is increased.
Portal vein is patent on color Doppler imaging with normal direction
of blood flow towards the liver.
IMPRESSION: 1. 5 mm apparent gallbladder polyp. Per consensus guidelines, a
polyp of this small size does not warrant additional imaging
surveillance. Gallbladder otherwise appears unremarkable.

2. Increase in liver echogenicity, a finding felt to be indicative
of a degree of hepatic steatosis. No focal liver lesions are
demonstrable.

## 2019-08-25 DIAGNOSIS — G4733 Obstructive sleep apnea (adult) (pediatric): Secondary | ICD-10-CM | POA: Diagnosis not present

## 2019-08-28 ENCOUNTER — Encounter: Payer: Self-pay | Admitting: Family Medicine

## 2019-08-28 ENCOUNTER — Ambulatory Visit (INDEPENDENT_AMBULATORY_CARE_PROVIDER_SITE_OTHER): Payer: Medicare Other | Admitting: Family Medicine

## 2019-08-28 ENCOUNTER — Other Ambulatory Visit: Payer: Self-pay

## 2019-08-28 VITALS — BP 139/67 | HR 61 | Temp 98.0°F | Resp 17 | Ht 62.0 in | Wt 230.8 lb

## 2019-08-28 DIAGNOSIS — R42 Dizziness and giddiness: Secondary | ICD-10-CM | POA: Diagnosis not present

## 2019-08-28 DIAGNOSIS — E785 Hyperlipidemia, unspecified: Secondary | ICD-10-CM

## 2019-08-28 DIAGNOSIS — I10 Essential (primary) hypertension: Secondary | ICD-10-CM

## 2019-08-28 DIAGNOSIS — F419 Anxiety disorder, unspecified: Secondary | ICD-10-CM

## 2019-08-28 DIAGNOSIS — F329 Major depressive disorder, single episode, unspecified: Secondary | ICD-10-CM

## 2019-08-28 DIAGNOSIS — F32A Depression, unspecified: Secondary | ICD-10-CM

## 2019-08-28 DIAGNOSIS — D508 Other iron deficiency anemias: Secondary | ICD-10-CM

## 2019-08-28 LAB — POCT CBC
Granulocyte percent: 62.4 %G (ref 37–80)
HCT, POC: 36 % (ref 29–41)
Hemoglobin: 11.9 g/dL (ref 11–14.6)
Lymph, poc: 1.7 (ref 0.6–3.4)
MCH, POC: 26.5 pg — AB (ref 27–31.2)
MCHC: 33.1 g/dL (ref 31.8–35.4)
MCV: 80 fL (ref 76–111)
MID (cbc): 0.8 (ref 0–0.9)
MPV: 5.9 fL (ref 0–99.8)
POC Granulocyte: 4.1 (ref 2–6.9)
POC LYMPH PERCENT: 26.1 %L (ref 10–50)
POC MID %: 11.5 %M (ref 0–12)
Platelet Count, POC: 362 10*3/uL (ref 142–424)
RBC: 4.5 M/uL (ref 4.04–5.48)
RDW, POC: 14.3 %
WBC: 6.6 10*3/uL (ref 4.6–10.2)

## 2019-08-28 MED ORDER — ESCITALOPRAM OXALATE 10 MG PO TABS: 10.0000 mg | ORAL_TABLET | Freq: Every day | ORAL | 1 refills | Status: DC

## 2019-08-28 NOTE — Progress Notes (Signed)
Established Patient Office Visit  Subjective:  Patient ID: Alyssa Jefferson, female    DOB: 02/10/1946  Age: 74 y.o. MRN: [id number]  CC:  Chief Complaint  Patient presents with  . Anxiety    6 week f/u, GAD SCORE: 2  . Depression     6 WEEK F/U DEPRESSION SCORE: 9    HPI Alyssa Jefferson presents for   Dizzy spells She reports that when she would lean forward she would feel dizzy when she sits back up She thinks it is from the combination of acetaminophen with the venlafaxine She states that her mood is better but she feels like the dizziness which started about 2 weeks ago is annoying  Anxiety and Depression  She reports that she is feeling less irritable and feels less stressed and anxious. She states that today she got dressed up and decided to go out and about.  GAD 7 : Generalized Anxiety Score 08/28/2019 07/17/2019  Nervous, Anxious, on Edge 0 3  Control/stop worrying 0 0  Worry too much - different things 1 1  Trouble relaxing 0 0  Restless 0 0  Easily annoyed or irritable 0 3  Afraid - awful might happen 1 1  Total GAD 7 Score 2 8  Anxiety Difficulty Not difficult at all Not difficult at all      Depression screen [facility] 2/9 08/28/2019 07/17/2019 06/22/2019 01/01/2019 10/10/2018  Decreased Interest 0 1 1 0 0  Down, Depressed, Hopeless 1 3 0 0 0  PHQ - 2 Score 1 4 1  0 0  Altered sleeping 0 0 - - -  Tired, decreased energy 0 3 - - -  Change in appetite 3 3 - - -  Feeling bad or failure about yourself  1 0 - - -  Trouble concentrating 3 1 - - -  Moving slowly or fidgety/restless 1 0 - - -  Suicidal thoughts 0 0 - - -  PHQ-9 Score 9 11 - - -  Difficult doing work/chores Not difficult at all Somewhat difficult - - -  Some recent data might be hidden    Hypertension: Patient here for follow-up of elevated blood pressure. She is not exercising and is adherent to low salt diet.  Blood pressure is well controlled at home. Cardiac symptoms none. Patient denies chest pain,  chest pressure/discomfort, claudication, exertional chest pressure/discomfort, fatigue and irregular heart beat.  Cardiovascular risk factors: dyslipidemia and hypertension.  BP Readings from Last 3 Encounters:  08/28/19 139/67  07/17/19 (!) 145/70  06/22/19 (!) 152/76   Dyslipidemia: Patient presents for evaluation of lipids.  Compliance with treatment thus far has been good.  A repeat fasting lipid profile was done.  The patient does not use medications that may worsen dyslipidemias (corticosteroids, progestins, anabolic steroids, diuretics, beta-blockers, amiodarone, cyclosporine, olanzapine). The patient exercises rarely.  The patient is not known to have coexisting coronary artery disease.   The 10-year ASCVD risk score Mikey Bussing DC Brooke Bonito., et al., 2013) is: 16%   Values used to calculate the score:     Age: 72 years     Sex: Female     Is Non-Hispanic African American: Yes     Diabetic: No     Tobacco smoker: No     Systolic Blood Pressure: [id number] mmHg     Is BP treated: Yes     HDL Cholesterol: 65 mg/dL     Total Cholesterol: 205 mg/dL      Past Medical History:  Diagnosis  Date  . Arthritis    knees, back  . Cancer [facility]) 2004   breast  . Depression   . GERD (gastroesophageal reflux disease)   . Glaucoma   . Heart palpitations   . High cholesterol   . History of transfusion    many years ago  . Hypertension   . Hypothyroidism   . Itching in the vaginal area   . Nocturia   . OSA (obstructive sleep apnea)    tried CPAP- but unsuccessful  . Renal disorder   . Thyroid disease    thyroid removed    Past Surgical History:  Procedure Laterality Date  . ABDOMINAL HYSTERECTOMY  83   BSO  . BREAST BIOPSY Right pt doesnt remember   benign  . BREAST SURGERY  2004   left breast removed due to cancer no chemo or radiation  . MASTECTOMY Left   . THYROIDECTOMY     due to knot  . 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 Lung cancer Father   . Breast cancer Sister   . Ovarian cancer Sister   . Colon cancer Neg Hx   . Esophageal cancer Neg Hx   . Stomach cancer Neg Hx   . Pancreatic cancer Neg Hx   . Liver disease Neg Hx     Social History   Socioeconomic History  . Marital status: Widowed    Spouse name: Not on file  . Number of children: 5  . Years of education: Not on file  . Highest education level: Not on file  Occupational History  . Occupation: retired    Fish farm manager: RETIRED  Tobacco Use  . Smoking status: Former Smoker    Years: 20.00    Types: Cigarettes    Quit date: 08/16/1978    Years since quitting: 41.0  . Smokeless tobacco: Never Used  . Tobacco comment: 1 pack per week--10/16/12  Substance and Sexual Activity  . Alcohol use: No  . Drug use: No  . Sexual activity: Not Currently  Other Topics Concern  . Not on file  Social History Narrative  . Not on file   Social Determinants of Health   Financial Resource Strain:   . Difficulty of Paying Living Expenses: Not on file  Food Insecurity:   . Worried About Charity fundraiser in the Last Year: Not on file  . Ran Out of Food in the Last Year: Not on file  Transportation Needs:   . Lack of Transportation (Medical): Not on file  . Lack of Transportation (Non-Medical): Not on file  Physical Activity:   . Days of Exercise per Week: Not on file  . Minutes of Exercise per Session: Not on file  Stress:   . Feeling of Stress : Not on file  Social Connections:   . Frequency of Communication with Friends and Family: Not on file  . Frequency of Social Gatherings with Friends and Family: Not on file  . Attends Religious Services: Not on file  . Active Member of Clubs or Organizations: Not on file  . Attends Archivist Meetings: Not on file  . Marital Status: Not on file  Intimate Partner  Violence:   . Fear of Current or Ex-Partner: Not on file  . Emotionally Abused: Not on file  .  Physically Abused: Not on file  . Sexually Abused: Not on file    Outpatient Medications Prior to Visit  Medication Sig Dispense Refill  . albuterol (VENTOLIN HFA) 108 (90 Base) MCG/ACT inhaler Inhale 2 puffs into the lungs every 6 (six) hours as needed for wheezing or shortness of breath. 1 Inhaler 0  . cetirizine (ZYRTEC) 10 MG tablet Take 1 tablet (10 mg total) by mouth daily. 30 tablet 11  . diltiazem (CARDIZEM CD) 180 MG 24 hr capsule TAKE 1 CAPSULE(180 MG) BY MOUTH DAILY 90 capsule 1  . esomeprazole (NEXIUM) 40 MG capsule TAKE 1 CAPSULE BY MOUTH ONCE DAILY AT NOON 90 capsule 1  . Ferrous Sulfate (SLOW FE) 142 (45 Fe) MG TBCR Take 1 tablet by mouth daily. 30 tablet 3  . latanoprost (XALATAN) 0.005 % ophthalmic solution Place 1 drop into both eyes at bedtime. 2.5 mL 6  . levothyroxine (SYNTHROID) 50 MCG tablet Take 1 tablet (50 mcg total) by mouth daily. 90 tablet 3  . rosuvastatin (CRESTOR) 20 MG tablet Take 1 tablet (20 mg total) by mouth at bedtime. 90 tablet 3  . spironolactone (ALDACTONE) 50 MG tablet TAKE 1 TABLET(50 MG) BY MOUTH DAILY 90 tablet 1  . venlafaxine XR (EFFEXOR-XR) 37.5 MG 24 hr capsule Take 1 capsule (37.5 mg total) by mouth daily with breakfast. 30 capsule 3   No facility-administered medications prior to visit.    Allergies  Allergen Reactions  . Codeine Other (See Comments)    confusion  . Lipitor [Atorvastatin Calcium] Itching    ROS Review of Systems Review of Systems  Constitutional: Negative for activity change, appetite change, chills and fever.  HENT: Negative for congestion, nosebleeds, trouble swallowing and voice change.   Respiratory: Negative for cough, shortness of breath and wheezing.   Gastrointestinal: Negative for diarrhea, nausea and vomiting.  Genitourinary: Negative for difficulty urinating, dysuria, flank pain and hematuria.    Musculoskeletal: Negative for back pain, joint swelling and neck pain.  Neurological: + dizziness, no speech difficulty, no light-headedness no numbness.  See HPI. All other review of systems negative.     Objective:    Physical Exam  BP 139/67 (BP Location: Right Arm, Patient Position: Sitting, Cuff Size: Large)   Pulse 61   Temp 98 F (36.7 C) (Oral)   Resp 17   Ht 5\' 2"  (1.575 m)   Wt 230 lb 12.8 oz (104.7 kg)   SpO2 95%   BMI 42.21 kg/m  Wt Readings from Last 3 Encounters:  08/28/19 230 lb 12.8 oz (104.7 kg)  07/17/19 230 lb 3.2 oz (104.4 kg)  06/22/19 234 lb (106.1 kg)   Physical Exam  Constitutional: Oriented to person, place, and time. Appears well-developed and well-nourished.  HENT:  Head: Normocephalic and atraumatic.  Eyes: Conjunctivae and EOM are normal.  Ears: TM clear, bilaterally Cardiovascular: Normal rate, regular rhythm, normal heart sounds and intact distal pulses.  No murmur heard. Pulmonary/Chest: Effort normal and breath sounds normal. No stridor. No respiratory distress. Has no wheezes.  Neurological: Is alert and oriented to person, place, and time.  Skin: Skin is warm. Capillary refill takes less than 2 seconds.  Psychiatric: Has a normal mood and affect. Behavior is normal. Judgment and thought content normal.    There are no preventive care reminders to display for this patient.  There are no preventive care reminders to display for this patient.  Lab Results  Component Value Date   TSH 1.140 06/22/2019   Lab Results  Component Value Date   WBC 6.6 08/28/2019   HGB 11.9 08/28/2019   HCT 36.0 08/28/2019   MCV 80.0 08/28/2019   PLT 347 06/08/2018   Lab Results  Component Value Date   NA 141 01/01/2019   K 4.5 01/01/2019   CO2 23 01/01/2019   GLUCOSE 86 01/01/2019   BUN 18 01/01/2019   CREATININE 1.33 (H) 01/01/2019   BILITOT 0.4 01/01/2019   ALKPHOS 86 01/01/2019   AST 13 01/01/2019   ALT 12 01/01/2019   PROT 7.3 01/01/2019    ALBUMIN 4.2 01/01/2019   CALCIUM 9.6 01/01/2019   ANIONGAP 8 01/11/2016   Lab Results  Component Value Date   CHOL 205 (H) 01/01/2019   Lab Results  Component Value Date   HDL 65 01/01/2019   Lab Results  Component Value Date   LDLCALC 118 (H) 01/01/2019   Lab Results  Component Value Date   TRIG 112 01/01/2019   Lab Results  Component Value Date   CHOLHDL 3.2 01/01/2019   Lab Results  Component Value Date   HGBA1C 5.7 (H) 09/20/2016      Assessment & Plan:   Problem List Items Addressed This Visit      Other   Anemia   -  Reviewed previous CBC   Relevant Orders   Comprehensive metabolic panel   POCT CBC (Completed)    Other Visit Diagnoses    Dyslipidemia    -  Primary   Relevant Orders   Lipid panel   Essential hypertension   - Patient's blood pressure is at goal of 139/89 or less. Condition is stable. Continue current medications and treatment plan. I recommend that you exercise for 30-45 minutes 5 days a week. I also recommend a balanced diet with fruits and vegetables every day, lean meats, and little fried foods. The DASH diet (you can find this online) is a good example of this.     Relevant Orders   Comprehensive metabolic panel   Anxiety and depression    -  Discussed SSRIs Symptoms improving   Relevant Medications   escitalopram (LEXAPRO) 10 MG tablet   Dizzy spells   - changed venlafaxine to lexapro Discussed that some SSRIs are associated with dizziness      Meds ordered this encounter  Medications  . escitalopram (LEXAPRO) 10 MG tablet    Sig: Take 1 tablet (10 mg total) by mouth daily.    Dispense:  30 tablet    Refill:  1    Follow-up: Return for virtual visit six weeks.    Forrest Moron, MD

## 2019-08-28 NOTE — Patient Instructions (Signed)
° ° ° °  If you have lab work done today you will be contacted with your lab results within the next 2 weeks.  If you have not heard from us then please contact us. The fastest way to get your results is to register for My Chart. ° ° °IF you received an x-ray today, you will receive an invoice from [facility]. Please contact [facility] at [phone] with questions or concerns regarding your invoice.  ° °IF you received labwork today, you will receive an invoice from [facility]. Please contact [facility] at [phone] with questions or concerns regarding your invoice.  ° °Our billing staff will not be able to assist you with questions regarding bills from these companies. ° °You will be contacted with the lab results as soon as they are available. The fastest way to get your results is to activate your My Chart account. Instructions are located on the last page of this paperwork. If you have not heard from us regarding the results in 2 weeks, please contact this office. °  ° ° ° °

## 2019-08-29 LAB — COMPREHENSIVE METABOLIC PANEL
ALT: 14 IU/L (ref 0–32)
AST: 15 IU/L (ref 0–40)
Albumin/Globulin Ratio: 1.4 (ref 1.2–2.2)
Albumin: 4.4 g/dL (ref 3.7–4.7)
Alkaline Phosphatase: 95 IU/L (ref 39–117)
BUN/Creatinine Ratio: 19 (ref 12–28)
BUN: 25 mg/dL (ref 8–27)
Bilirubin Total: 0.5 mg/dL (ref 0.0–1.2)
CO2: 21 mmol/L (ref 20–29)
Calcium: 9.5 mg/dL (ref 8.7–10.3)
Chloride: 102 mmol/L (ref 96–106)
Creatinine, Ser: 1.31 mg/dL — ABNORMAL HIGH (ref 0.57–1.00)
GFR calc Af Amer: 47 mL/min/{1.73_m2} — ABNORMAL LOW (ref >59)
GFR calc non Af Amer: 41 mL/min/{1.73_m2} — ABNORMAL LOW (ref >59)
Globulin, Total: 3.1 g/dL (ref 1.5–4.5)
Glucose: 85 mg/dL (ref 65–99)
Potassium: 4.6 mmol/L (ref 3.5–5.2)
Sodium: 140 mmol/L (ref 134–144)
Total Protein: 7.5 g/dL (ref 6.0–8.5)

## 2019-08-29 LAB — LIPID PANEL
Chol/HDL Ratio: 3.2 ratio (ref 0.0–4.4)
Cholesterol, Total: 227 mg/dL — ABNORMAL HIGH (ref 100–199)
HDL: 72 mg/dL (ref >39)
LDL Chol Calc (NIH): 140 mg/dL — ABNORMAL HIGH (ref 0–99)
Triglycerides: 89 mg/dL (ref 0–149)
VLDL Cholesterol Cal: 15 mg/dL (ref 5–40)

## 2019-09-04 ENCOUNTER — Telehealth: Payer: Self-pay | Admitting: Family Medicine

## 2019-09-04 NOTE — Telephone Encounter (Signed)
Copied from [place] [id number]. Topic: General - Other >> Sep 04, 2019 12:53 PM Yvette Rack wrote: Reason for CRM: Pt stated she needs to speak with Dr. Nolon Rod nurse to discuss medication that is not working. Pt declined to go in to detail and just asked that Dr. Nolon Rod nurse call her back.

## 2019-09-05 NOTE — Telephone Encounter (Signed)
I have called pt back and she stated that she is having side effects from the medication. Pt is not tolerating the Lexapro. Pt stated that she has taken only half a tablet and she is unable to sleep and making her dizzy. She is not going to take it for the next night and see how she is feeling.   Please advise on any additional recommendations.

## 2019-09-05 NOTE — Telephone Encounter (Signed)
Pt called again requesting return call from Dr. Volanda Napoleon nurse/CMA today. Stated it was an emergency and did not want to speak with anyone else. Please return call. Office closed for lunch.

## 2019-09-09 ENCOUNTER — Ambulatory Visit: Payer: Medicare Other | Attending: Internal Medicine

## 2019-09-09 DIAGNOSIS — Z23 Encounter for immunization: Secondary | ICD-10-CM | POA: Insufficient documentation

## 2019-09-10 NOTE — Progress Notes (Signed)
   Covid-19 Vaccination Clinic  Name:  Alyssa Jefferson    MRN: [id number] DOB: Dec 31, 1945  09/09/2019  [name] was observed post Covid-19 immunization for 15 minutes without incidence. She was provided with Vaccine Information Sheet and instruction to access the V-Safe system.   [name] was instructed to call 911 with any severe reactions post vaccine: Marland Kitchen Difficulty breathing  . Swelling of your face and throat  . A fast heartbeat  . A bad rash all over your body  . Dizziness and weakness    Immunizations Administered    Name Date Dose VIS Date Route   Moderna COVID-19 Vaccine 09/09/2019  3:50 PM 0.5 mL 07/17/2019 Intramuscular   Manufacturer: Levan Hurst   Lot: [id number]   [id number]      Documented on behalf of: P. Joya Gaskins

## 2019-09-11 ENCOUNTER — Telehealth: Payer: Self-pay | Admitting: Family Medicine

## 2019-09-11 NOTE — Telephone Encounter (Signed)
Patient wants a call from Dr. Nolon Rod or nurse Ellis Parents call her at r [phone] . Would not give details

## 2019-09-14 NOTE — Telephone Encounter (Signed)
Patient reports that she could not afford the copay for counseling.  She reports that she also had dizziness with the lexapro and stopped it.  She now has tried lexapra and venlafaxine and both have made her dizzy.   Advised that I will try to connect her with our virtual behavioral health program for her Depression and Irritability

## 2019-09-14 NOTE — Telephone Encounter (Signed)
I have called pt back and she stated that she has been moody and wants to change mood medication. I have spoken to her on 09/05/19 about taking the lexapro and pt would like to know if there is something else that she can take.   Please advise on med change.   Does pt have to come to office?

## 2019-09-18 ENCOUNTER — Telehealth: Payer: Self-pay | Admitting: *Deleted

## 2019-09-18 NOTE — Telephone Encounter (Signed)
Schedule AWV.  

## 2019-09-21 ENCOUNTER — Ambulatory Visit: Payer: Medicare Other | Admitting: Family Medicine

## 2019-09-25 DIAGNOSIS — G4733 Obstructive sleep apnea (adult) (pediatric): Secondary | ICD-10-CM | POA: Diagnosis not present

## 2019-10-07 ENCOUNTER — Ambulatory Visit: Payer: Medicare Other | Attending: Internal Medicine

## 2019-10-07 ENCOUNTER — Other Ambulatory Visit: Payer: Self-pay

## 2019-10-07 DIAGNOSIS — Z23 Encounter for immunization: Secondary | ICD-10-CM | POA: Insufficient documentation

## 2019-10-07 NOTE — Progress Notes (Signed)
   Covid-19 Vaccination Clinic  Name:  Alyssa Jefferson    MRN: [id number] DOB: 1945/12/26  10/07/2019  Ms. Pleasant was observed post Covid-19 immunization for 15 minutes without incidence. She was provided with Vaccine Information Sheet and instruction to access the V-Safe system.   [name] was instructed to call 911 with any severe reactions post vaccine: Marland Kitchen Difficulty breathing  . Swelling of your face and throat  . A fast heartbeat  . A bad rash all over your body  . Dizziness and weakness    Immunizations Administered    Name Date Dose VIS Date Route   Moderna COVID-19 Vaccine 10/07/2019  4:09 PM 0.5 mL 07/17/2019 Intramuscular   Manufacturer: Moderna   Lot: [id number]   [id number]

## 2019-10-08 ENCOUNTER — Ambulatory Visit (INDEPENDENT_AMBULATORY_CARE_PROVIDER_SITE_OTHER): Payer: Medicare Other | Admitting: Family Medicine

## 2019-10-08 VITALS — BP 139/67 | Ht 62.0 in | Wt 230.0 lb

## 2019-10-08 DIAGNOSIS — Z0000 Encounter for general adult medical examination without abnormal findings: Secondary | ICD-10-CM | POA: Diagnosis not present

## 2019-10-08 NOTE — Patient Instructions (Signed)
Thank you for taking time to come for your Medicare Wellness Visit. I appreciate your ongoing commitment to your health goals. Please review the following plan we discussed and let me know if I can assist you in the future.  Sherill Wegener LPN  Preventive Care 73 Years and Older, Female Preventive care refers to lifestyle choices and visits with your health care provider that can promote health and wellness. This includes:  A yearly physical exam. This is also called an annual well check.  Regular dental and eye exams.  Immunizations.  Screening for certain conditions.  Healthy lifestyle choices, such as diet and exercise. What can I expect for my preventive care visit? Physical exam Your health care provider will check:  Height and weight. These may be used to calculate body mass index (BMI), which is a measurement that tells if you are at a healthy weight.  Heart rate and blood pressure.  Your skin for abnormal spots. Counseling Your health care provider may ask you questions about:  Alcohol, tobacco, and drug use.  Emotional well-being.  Home and relationship well-being.  Sexual activity.  Eating habits.  History of falls.  Memory and ability to understand (cognition).  Work and work environment.  Pregnancy and menstrual history. What immunizations do I need?  Influenza (flu) vaccine  This is recommended every year. Tetanus, diphtheria, and pertussis (Tdap) vaccine  You may need a Td booster every 10 years. Varicella (chickenpox) vaccine  You may need this vaccine if you have not already been vaccinated. Zoster (shingles) vaccine  You may need this after age 60. Pneumococcal conjugate (PCV13) vaccine  One dose is recommended after age 65. Pneumococcal polysaccharide (PPSV23) vaccine  One dose is recommended after age 65. Measles, mumps, and rubella (MMR) vaccine  You may need at least one dose of MMR if you were born in 1957 or later. You may also  need a second dose. Meningococcal conjugate (MenACWY) vaccine  You may need this if you have certain conditions. Hepatitis A vaccine  You may need this if you have certain conditions or if you travel or work in places where you may be exposed to hepatitis A. Hepatitis B vaccine  You may need this if you have certain conditions or if you travel or work in places where you may be exposed to hepatitis B. Haemophilus influenzae type b (Hib) vaccine  You may need this if you have certain conditions. You may receive vaccines as individual doses or as more than one vaccine together in one shot (combination vaccines). Talk with your health care provider about the risks and benefits of combination vaccines. What tests do I need? Blood tests  Lipid and cholesterol levels. These may be checked every 5 years, or more frequently depending on your overall health.  Hepatitis C test.  Hepatitis B test. Screening  Lung cancer screening. You may have this screening every year starting at age 55 if you have a 30-pack-year history of smoking and currently smoke or have quit within the past 15 years.  Colorectal cancer screening. All adults should have this screening starting at age 50 and continuing until age 75. Your health care provider may recommend screening at age 45 if you are at increased risk. You will have tests every 1-10 years, depending on your results and the type of screening test.  Diabetes screening. This is done by checking your blood sugar (glucose) after you have not eaten for a while (fasting). You may have this done every 1-3   years.  Mammogram. This may be done every 1-2 years. Talk with your health care provider about how often you should have regular mammograms.  BRCA-related cancer screening. This may be done if you have a family history of breast, ovarian, tubal, or peritoneal cancers. Other tests  Sexually transmitted disease (STD) testing.  Bone density scan. This is done  to screen for osteoporosis. You may have this done starting at age 94. Follow these instructions at home: Eating and drinking  Eat a diet that includes fresh fruits and vegetables, whole grains, lean protein, and low-fat dairy products. Limit your intake of foods with high amounts of sugar, saturated fats, and salt.  Take vitamin and mineral supplements as recommended by your health care provider.  Do not drink alcohol if your health care provider tells you not to drink.  If you drink alcohol: ? Limit how much you have to 0-1 drink a day. ? Be aware of how much alcohol is in your drink. In the [place] one drink equals one 12 oz bottle of beer (355 mL), one 5 oz glass of wine (148 mL), or one 1 oz glass of hard liquor (44 mL). Lifestyle  Take daily care of your teeth and gums.  Stay active. Exercise for at least 30 minutes on 5 or more days each week.  Do not use any products that contain nicotine or tobacco, such as cigarettes, e-cigarettes, and chewing tobacco. If you need help quitting, ask your health care provider.  If you are sexually active, practice safe sex. Use a condom or other form of protection in order to prevent STIs (sexually transmitted infections).  Talk with your health care provider about taking a low-dose aspirin or statin. What's next?  Go to your health care provider once a year for a well check visit.  Ask your health care provider how often you should have your eyes and teeth checked.  Stay up to date on all vaccines. This information is not intended to replace advice given to you by your health care provider. Make sure you discuss any questions you have with your health care provider. Document Revised: 07/27/2018 Document Reviewed: 07/27/2018 Elsevier Patient Education  2020 [employer].

## 2019-10-08 NOTE — Progress Notes (Signed)
Presents today for [employer] Visit   Date of last exam: 08/28/2019  Interpreter used for this visit? No  I connected with  Mervyn Skeeters on 10/08/19 by a telephone telemedicine  that I am speaking with the correct person using two identifiers.   I discussed the limitations of evaluation and management by telemedicine. The patient expressed understanding and agreed to proceed.    Patient Care Team: Forrest Moron, MD as PCP - General (Internal Medicine) Everlene Farrier Loura Back, MD (Family Medicine) Elmarie Shiley, MD as Consulting Physician (Nephrology)   Other items to address today:   Discussed [place] Follow up scheduled Dr. Nolon Rod 2-23 @ 10;40 Patient will discuss unable to afford referral to Phychiatric, has stopped taking Lexapro  Will discuss at visit tomorrow.     Other Screening:  Last lipid screening: 08/28/2019  ADVANCE DIRECTIVES: Discussed: YES On File: NO Materials Provided: YES  Immunization status:  Immunization History  Administered Date(s) Administered  . Moderna SARS-COVID-2 Vaccination 09/09/2019, 10/07/2019  . Pneumococcal Conjugate-13 12/31/2014  . Pneumococcal Polysaccharide-23 06/08/2018     There are no preventive care reminders to display for this patient.   Functional Status Survey: Is the patient deaf or have difficulty hearing?: No Does the patient have difficulty seeing, even when wearing glasses/contacts?: No Does the patient have difficulty concentrating, remembering, or making decisions?: No Does the patient have difficulty walking or climbing stairs?: Yes(only when has knee pain) Does the patient have difficulty dressing or bathing?: No Does the patient have difficulty doing errands alone such as visiting a doctor's office or shopping?: No   6CIT Screen 10/08/2019  What Year? 0 points  What month? 0 points  What time? 0 points  Count back from 20 0 points  Months in reverse 0 points    Repeat phrase 0 points  Total Score 0        Clinical Support from 10/08/2019 in [facility] at [facility]  AUDIT-C Score  0       Home Environment:   Lives in a two story home No scattered rugs Yes grab bars Adequate lighting/no clutter Some trouble climbing stairs when her knee is acting up.  Timed warm up N/A  Patient Active Problem List   Diagnosis Date Noted  . CKD (chronic kidney disease), stage III 09/12/2017  . Secondary hyperparathyroidism ([place]) 09/12/2017  . OA (osteoarthritis) of knee 07/28/2015  . Palpitations 10/17/2014  . OSA (obstructive sleep apnea) 10/16/2012  . BMI 40.0-44.9, adult ([place]) 12/20/2011  . Hypothyroid 12/20/2011  . 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 Osteoarthritis, knee 12/20/2011  . Other and unspecified hyperlipidemia 12/20/2011  . Depressive disorder, not elsewhere classified 12/20/2011  . Unspecified essential hypertension 12/20/2011  . Anemia 12/20/2011  . Cancer [facility])      Past Medical History:  Diagnosis Date  . Arthritis    knees, back  . Cancer [facility]) 2004   breast  . Depression   . GERD (gastroesophageal reflux disease)   . Glaucoma   . Heart palpitations   . High cholesterol   . History of transfusion    many years ago  . Hypertension   . Hypothyroidism   . Itching in the vaginal area   . Nocturia   . OSA (obstructive sleep apnea)    tried CPAP- but unsuccessful  . Renal disorder   . Thyroid disease    thyroid removed     Past Surgical History:  Procedure Laterality Date  . ABDOMINAL HYSTERECTOMY  83   BSO  . BREAST BIOPSY Right pt doesnt remember   benign  . BREAST SURGERY  2004   left breast removed due to cancer no chemo or radiation  . MASTECTOMY Left   . THYROIDECTOMY     due to knot  . TOTAL KNEE ARTHROPLASTY Left 07/28/2015   Procedure: LEFT TOTAL KNEE ARTHROPLASTY;  Surgeon: Gaynelle Arabian, MD;  Location: [facility];  Service: Orthopedics;  Laterality: Left;      Family History  Problem Relation Age of Onset  . Lung cancer Father   . Breast cancer Sister   . Ovarian cancer Sister   . Colon cancer Neg Hx   . Esophageal cancer Neg Hx   . Stomach cancer Neg Hx   . Pancreatic cancer Neg Hx   . Liver disease Neg Hx      Social History   Socioeconomic History  . Marital status: Widowed    Spouse name: Not on file  . Number of children: 5  . Years of education: Not on file  . Highest education level: Not on file  Occupational History  . Occupation: retired    Fish farm manager: RETIRED  Tobacco Use  . Smoking status: Former Smoker    Years: 20.00    Types: Cigarettes    Quit date: 08/16/1978    Years since quitting: 41.1  . Smokeless tobacco: Never Used  . Tobacco comment: 1 pack per week--10/16/12  Substance and Sexual Activity  . Alcohol use: No  . Drug use: No  . Sexual activity: Not Currently  Other Topics Concern  . Not on file  Social History Narrative  . Not on file   Social Determinants of Health   Financial Resource Strain:   . Difficulty of Paying Living Expenses: Not on file  Food Insecurity:   . Worried About Charity fundraiser in the Last Year: Not on file  . Ran Out of Food in the Last Year: Not on file  Transportation Needs:   . Lack of Transportation (Medical): Not on file  . Lack of Transportation (Non-Medical): Not on file  Physical Activity:   . Days of Exercise per Week: Not on file  . Minutes of Exercise per Session: Not on file  Stress:   . Feeling of Stress : Not on file  Social Connections:   . Frequency of Communication with Friends and Family: Not on file  . Frequency of Social Gatherings with Friends and Family: Not on file  . Attends Religious Services: Not on file  . Active Member of Clubs or Organizations: Not on file  . Attends Archivist Meetings: Not on file  . Marital Status: Not on file  Intimate Partner Violence:   . Fear of Current or Ex-Partner: Not on file  . Emotionally  Abused: Not on file  . Physically Abused: Not on file  . Sexually Abused: Not on file     Allergies  Allergen Reactions  . Codeine Other (See Comments)    confusion  . Lipitor [Atorvastatin Calcium] Itching     Prior to Admission medications   Medication Sig Start Date End Date Taking? Authorizing Provider  albuterol (VENTOLIN HFA) 108 (90 Base) MCG/ACT inhaler Inhale 2 puffs into the lungs every 6 (six) hours as needed for wheezing or shortness of breath. 01/01/19  Yes Forrest Moron, MD  cetirizine (ZYRTEC) 10 MG tablet Take 1 tablet (10 mg total) by mouth daily. 01/01/19  Yes Stallings, Zoe A, MD  diltiazem (CARDIZEM CD) 180 MG 24 hr capsule TAKE 1 CAPSULE(180 MG) BY MOUTH DAILY 07/09/19  Yes Stallings, Zoe A, MD  esomeprazole (NEXIUM) 40 MG capsule TAKE 1 CAPSULE BY MOUTH ONCE DAILY AT NOON 01/08/19  Yes Stallings, Zoe A, MD  Ferrous Sulfate (SLOW FE) 142 (45 Fe) MG TBCR Take 1 tablet by mouth daily. 06/22/19  Yes Stallings, Zoe A, MD  latanoprost (XALATAN) 0.005 % ophthalmic solution Place 1 drop into both eyes at bedtime. 01/01/19  Yes Forrest Moron, MD  levothyroxine (SYNTHROID) 50 MCG tablet Take 1 tablet (50 mcg total) by mouth daily. 01/04/19  Yes Stallings, Zoe A, MD  rosuvastatin (CRESTOR) 20 MG tablet Take 1 tablet (20 mg total) by mouth at bedtime. 01/01/19  Yes Stallings, Zoe A, MD  spironolactone (ALDACTONE) 50 MG tablet TAKE 1 TABLET(50 MG) BY MOUTH DAILY 07/09/19  Yes Stallings, Zoe A, MD  escitalopram (LEXAPRO) 10 MG tablet Take 1 tablet (10 mg total) by mouth daily. Patient not taking: Reported on 10/08/2019 08/28/19   Forrest Moron, MD     Depression screen [facility] 2/9 10/08/2019 08/28/2019 07/17/2019 06/22/2019 01/01/2019  Decreased Interest 1 0 1 1 0  Down, Depressed, Hopeless 1 1 3  0 0  PHQ - 2 Score 2 1 4 1  0  Altered sleeping 0 0 0 - -  Tired, decreased energy 0 0 3 - -  Change in appetite 1 3 3  - -  Feeling bad or failure about yourself  1 1 0 - -  Trouble  concentrating 0 3 1 - -  Moving slowly or fidgety/restless 0 1 0 - -  Suicidal thoughts 0 0 0 - -  PHQ-9 Score 4 9 11  - -  Difficult doing work/chores Somewhat difficult Not difficult at all Somewhat difficult - -  Some recent data might be hidden     Fall Risk  10/08/2019 08/28/2019 07/17/2019 01/01/2019 10/10/2018  Falls in the past year? 0 0 0 0 0  Number falls in past yr: 0 0 0 0 0  Injury with Fall? 0 0 0 0 0  Follow up Falls evaluation completed;Education provided - Falls evaluation completed Falls evaluation completed Falls evaluation completed      PHYSICAL EXAM: BP 139/67 Comment: NOT IN CLINIC  Ht 5\' 2"  (1.575 m)   Wt 230 lb (104.3 kg)   BMI 42.07 kg/m    Wt Readings from Last 3 Encounters:  10/08/19 230 lb (104.3 kg)  08/28/19 230 lb 12.8 oz (104.7 kg)  07/17/19 230 lb 3.2 oz (104.4 kg)      Education/Counseling provided regarding diet and exercise, prevention of chronic diseases, smoking/tobacco cessation, if applicable, and reviewed "Covered Medicare Preventive Services."

## 2019-10-09 ENCOUNTER — Other Ambulatory Visit: Payer: Self-pay

## 2019-10-09 ENCOUNTER — Telehealth (INDEPENDENT_AMBULATORY_CARE_PROVIDER_SITE_OTHER): Payer: Self-pay | Admitting: Family Medicine

## 2019-10-09 DIAGNOSIS — Z5329 Procedure and treatment not carried out because of patient's decision for other reasons: Secondary | ICD-10-CM

## 2019-10-09 DIAGNOSIS — Z0130 Encounter for examination of blood pressure without abnormal findings: Secondary | ICD-10-CM

## 2019-10-09 DIAGNOSIS — Z91199 Patient's noncompliance with other medical treatment and regimen due to unspecified reason: Secondary | ICD-10-CM

## 2019-10-09 NOTE — Patient Instructions (Signed)
° ° ° °  If you have lab work done today you will be contacted with your lab results within the next 2 weeks.  If you have not heard from us then please contact us. The fastest way to get your results is to register for My Chart. ° ° °IF you received an x-ray today, you will receive an invoice from [facility]. Please contact [facility] at [phone] with questions or concerns regarding your invoice.  ° °IF you received labwork today, you will receive an invoice from [facility]. Please contact [facility] at [phone] with questions or concerns regarding your invoice.  ° °Our billing staff will not be able to assist you with questions regarding bills from these companies. ° °You will be contacted with the lab results as soon as they are available. The fastest way to get your results is to activate your My Chart account. Instructions are located on the last page of this paperwork. If you have not heard from us regarding the results in 2 weeks, please contact this office. °  ° ° ° °

## 2019-10-09 NOTE — Progress Notes (Signed)
No answer

## 2019-10-09 NOTE — Progress Notes (Signed)
CC: 6 week f/u blood pressure and thyroid.  No recent weight or bp taken.  No refills needed at this time.  Pt has concerns as she hasn't heard from anyone regarding psych referral and she can't get refill on her nerve medication. No travel outside the [place] or [place] in the past 3 weeks.

## 2019-10-17 ENCOUNTER — Other Ambulatory Visit: Payer: Self-pay

## 2019-10-17 ENCOUNTER — Telehealth (INDEPENDENT_AMBULATORY_CARE_PROVIDER_SITE_OTHER): Payer: Medicare Other | Admitting: Family Medicine

## 2019-10-17 ENCOUNTER — Encounter: Payer: Self-pay | Admitting: Family Medicine

## 2019-10-17 VITALS — Ht 62.0 in | Wt 230.0 lb

## 2019-10-17 DIAGNOSIS — I10 Essential (primary) hypertension: Secondary | ICD-10-CM | POA: Diagnosis not present

## 2019-10-17 DIAGNOSIS — R05 Cough: Secondary | ICD-10-CM

## 2019-10-17 DIAGNOSIS — F329 Major depressive disorder, single episode, unspecified: Secondary | ICD-10-CM

## 2019-10-17 DIAGNOSIS — R059 Cough, unspecified: Secondary | ICD-10-CM

## 2019-10-17 DIAGNOSIS — R0982 Postnasal drip: Secondary | ICD-10-CM | POA: Diagnosis not present

## 2019-10-17 DIAGNOSIS — E0580 Other thyrotoxicosis without thyrotoxic crisis or storm: Secondary | ICD-10-CM

## 2019-10-17 DIAGNOSIS — F32A Depression, unspecified: Secondary | ICD-10-CM

## 2019-10-17 DIAGNOSIS — F419 Anxiety disorder, unspecified: Secondary | ICD-10-CM

## 2019-10-17 MED ORDER — SPIRONOLACTONE 50 MG PO TABS: ORAL_TABLET | ORAL | 1 refills | Status: DC

## 2019-10-17 MED ORDER — CETIRIZINE HCL 10 MG PO TABS: 10.0000 mg | ORAL_TABLET | Freq: Every day | ORAL | 11 refills | Status: DC

## 2019-10-17 MED ORDER — ESOMEPRAZOLE MAGNESIUM 40 MG PO CPDR: DELAYED_RELEASE_CAPSULE | ORAL | 1 refills | Status: DC

## 2019-10-17 MED ORDER — LEVOTHYROXINE SODIUM 50 MCG PO TABS: 50.0000 ug | ORAL_TABLET | Freq: Every day | ORAL | 3 refills | Status: AC

## 2019-10-17 NOTE — Progress Notes (Signed)
Thyroid f/u

## 2019-10-17 NOTE — Patient Instructions (Signed)
° ° ° °  If you have lab work done today you will be contacted with your lab results within the next 2 weeks.  If you have not heard from us then please contact us. The fastest way to get your results is to register for My Chart. ° ° °IF you received an x-ray today, you will receive an invoice from [facility]. Please contact [facility] at [phone] with questions or concerns regarding your invoice.  ° °IF you received labwork today, you will receive an invoice from [facility]. Please contact [facility] at [phone] with questions or concerns regarding your invoice.  ° °Our billing staff will not be able to assist you with questions regarding bills from these companies. ° °You will be contacted with the lab results as soon as they are available. The fastest way to get your results is to activate your My Chart account. Instructions are located on the last page of this paperwork. If you have not heard from us regarding the results in 2 weeks, please contact this office. °  ° ° ° °

## 2019-10-17 NOTE — Progress Notes (Signed)
Telemedicine Encounter- SOAP NOTE Established Patient Patient declined video visit  This telephone encounter was conducted with the patient's (or proxy's) verbal consent via audio telecommunications: yes/no: Yes Patient was instructed to have this encounter in a suitably private space; and to only have persons present to whom they give permission to participate. In addition, patient identity was confirmed by use of name plus two identifiers (DOB and address).  I discussed the limitations, risks, security and privacy concerns of performing an evaluation and management service by telephone and the availability of in person appointments. I also discussed with the patient that there may be a patient responsible charge related to this service. The patient expressed understanding and agreed to proceed.  I spent a total of TIME; 0 MIN TO 60 MIN: 20 minutes talking with the patient or their proxy.  CC: med refills for thyroid  Subjective   Alyssa Jefferson is a 74 y.o. established patient. Telephone visit today for  HPI Hypothyroidism Anxiety and Depression  Patient reports that her mood has been good lately She denies depression or anxiety She states that every once in a while she gets sad but she prays it off and keeps moving She is exercising by walking She states that she is not as cold as previous since taking her iron supplement  She takes all her meds at [facility] including her thyroid medication.   Hypertension: Patient here for follow-up of elevated blood pressure. She is exercising and is adherent to low salt diet.  Blood pressure is well controlled at home. Cardiac symptoms none. Patient denies chest pain, claudication, dyspnea, exertional chest pressure/discomfort, irregular heart beat, lower extremity edema and near-syncope.  Cardiovascular risk factors: dyslipidemia and hypertension. Use of agents associated with hypertension: none. History of target organ damage: none. BP  Readings from Last 3 Encounters:  10/08/19 139/67  08/28/19 139/67  07/17/19 (!) 145/70    Postnasal drip Cough She reports that she is coughing up brown mucus She coughs more when she is lying down It is becoming a daily cough She states that the beige mucus that she coughs up is not like coffee ground emesis She is having postnasal drainage She does not take any meds for allergies She denies any reflux symptoms    Patient Active Problem List   Diagnosis Date Noted  . CKD (chronic kidney disease), stage III 09/12/2017  . Secondary hyperparathyroidism ([place]) 09/12/2017  . OA (osteoarthritis) of knee 07/28/2015  . Palpitations 10/17/2014  . OSA (obstructive sleep apnea) 10/16/2012  . BMI 40.0-44.9, adult ([place]) 12/20/2011  . Hypothyroid 12/20/2011  . 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 Osteoarthritis, knee 12/20/2011  . Other and unspecified hyperlipidemia 12/20/2011  . Depressive disorder, not elsewhere classified 12/20/2011  . Unspecified essential hypertension 12/20/2011  . Anemia 12/20/2011  . Cancer [facility])     Past Medical History:  Diagnosis Date  . Arthritis    knees, back  . Cancer [facility]) 2004   breast  . Depression   . GERD (gastroesophageal reflux disease)   . Glaucoma   . Heart palpitations   . High cholesterol   . History of transfusion    many years ago  . Hypertension   . Hypothyroidism   . Itching in the vaginal area   . Nocturia   . OSA (obstructive sleep apnea)    tried CPAP- but unsuccessful  . Renal disorder   . Thyroid disease    thyroid removed  Current Outpatient Medications  Medication Sig Dispense Refill  . albuterol (VENTOLIN HFA) 108 (90 Base) MCG/ACT inhaler Inhale 2 puffs into the lungs every 6 (six) hours as needed for wheezing or shortness of breath. 1 Inhaler 0  . cetirizine (ZYRTEC) 10 MG tablet Take 1 tablet (10 mg total) by mouth daily. 30 tablet 11  . diltiazem (CARDIZEM CD) 180 MG 24 hr  capsule TAKE 1 CAPSULE(180 MG) BY MOUTH DAILY 90 capsule 1  . escitalopram (LEXAPRO) 10 MG tablet Take 1 tablet (10 mg total) by mouth daily. 30 tablet 1  . esomeprazole (NEXIUM) 40 MG capsule TAKE 1 CAPSULE BY MOUTH ONCE DAILY AT NOON 90 capsule 1  . Ferrous Sulfate (SLOW FE) 142 (45 Fe) MG TBCR Take 1 tablet by mouth daily. 30 tablet 3  . latanoprost (XALATAN) 0.005 % ophthalmic solution Place 1 drop into both eyes at bedtime. 2.5 mL 6  . levothyroxine (SYNTHROID) 50 MCG tablet Take 1 tablet (50 mcg total) by mouth daily. 90 tablet 3  . rosuvastatin (CRESTOR) 20 MG tablet Take 1 tablet (20 mg total) by mouth at bedtime. 90 tablet 3  . spironolactone (ALDACTONE) 50 MG tablet Take one tablet by mouth daily 90 tablet 1   No current facility-administered medications for this visit.    Allergies  Allergen Reactions  . Codeine Other (See Comments)    confusion  . Lipitor [Atorvastatin Calcium] Itching    Social History   Socioeconomic History  . Marital status: Widowed    Spouse name: Not on file  . Number of children: 5  . Years of education: Not on file  . Highest education level: Not on file  Occupational History  . Occupation: retired    Fish farm manager: RETIRED  Tobacco Use  . Smoking status: Former Smoker    Years: 20.00    Types: Cigarettes    Quit date: 08/16/1978    Years since quitting: 41.1  . Smokeless tobacco: Never Used  . Tobacco comment: 1 pack per week--10/16/12  Substance and Sexual Activity  . Alcohol use: No  . Drug use: No  . Sexual activity: Not Currently  Other Topics Concern  . Not on file  Social History Narrative  . Not on file   Social Determinants of Health   Financial Resource Strain:   . Difficulty of Paying Living Expenses: Not on file  Food Insecurity:   . Worried About Charity fundraiser in the Last Year: Not on file  . Ran Out of Food in the Last Year: Not on file  Transportation Needs:   . Lack of Transportation (Medical): Not on file  .  Lack of Transportation (Non-Medical): Not on file  Physical Activity:   . Days of Exercise per Week: Not on file  . Minutes of Exercise per Session: Not on file  Stress:   . Feeling of Stress : Not on file  Social Connections:   . Frequency of Communication with Friends and Family: Not on file  . Frequency of Social Gatherings with Friends and Family: Not on file  . Attends Religious Services: Not on file  . Active Member of Clubs or Organizations: Not on file  . Attends Archivist Meetings: Not on file  . Marital Status: Not on file  Intimate Partner Violence:   . Fear of Current or Ex-Partner: Not on file  . Emotionally Abused: Not on file  . Physically Abused: Not on file  . Sexually Abused: Not on file  ROS  Objective   Vitals as reported by the patient: Today's Vitals   10/17/19 0803  Weight: 230 lb (104.3 kg)  Height: 5\' 2"  (1.575 m)    Diagnoses and all orders for this visit:  Postnasal discharge  Iatrogenic hyperthyroidism - improved with dose change -     levothyroxine (SYNTHROID) 50 MCG tablet; Take 1 tablet (50 mcg total) by mouth daily.  Cough- discussed her symptoms and discussed that she should resume zyrtec  Essential hypertension- stable, refilled spironolactone  Anxiety and depression- improved  Other orders -     cetirizine (ZYRTEC) 10 MG tablet; Take 1 tablet (10 mg total) by mouth daily. -     spironolactone (ALDACTONE) 50 MG tablet; Take one tablet by mouth daily -     esomeprazole (NEXIUM) 40 MG capsule; TAKE 1 CAPSULE BY MOUTH ONCE DAILY AT NOON     I discussed the assessment and treatment plan with the patient. The patient was provided an opportunity to ask questions and all were answered. The patient agreed with the plan and demonstrated an understanding of the instructions.   The patient was advised to call back or seek an in-person evaluation if the symptoms worsen or if the condition fails to improve as anticipated.  I  provided 20 minutes of non-face-to-face time during this encounter.  Forrest Moron, MD  Primary Care at [facility]

## 2019-10-23 DIAGNOSIS — G4733 Obstructive sleep apnea (adult) (pediatric): Secondary | ICD-10-CM | POA: Diagnosis not present

## 2019-10-24 DIAGNOSIS — H16143 Punctate keratitis, bilateral: Secondary | ICD-10-CM | POA: Diagnosis not present

## 2019-10-24 DIAGNOSIS — H401111 Primary open-angle glaucoma, right eye, mild stage: Secondary | ICD-10-CM | POA: Diagnosis not present

## 2019-10-24 DIAGNOSIS — H40022 Open angle with borderline findings, high risk, left eye: Secondary | ICD-10-CM | POA: Diagnosis not present

## 2019-10-24 DIAGNOSIS — H25813 Combined forms of age-related cataract, bilateral: Secondary | ICD-10-CM | POA: Diagnosis not present

## 2019-11-14 DIAGNOSIS — H401112 Primary open-angle glaucoma, right eye, moderate stage: Secondary | ICD-10-CM | POA: Diagnosis not present

## 2019-11-14 DIAGNOSIS — H25813 Combined forms of age-related cataract, bilateral: Secondary | ICD-10-CM | POA: Diagnosis not present

## 2019-11-23 DIAGNOSIS — G4733 Obstructive sleep apnea (adult) (pediatric): Secondary | ICD-10-CM | POA: Diagnosis not present

## 2019-12-23 DIAGNOSIS — G4733 Obstructive sleep apnea (adult) (pediatric): Secondary | ICD-10-CM | POA: Diagnosis not present

## 2019-12-26 DIAGNOSIS — H04123 Dry eye syndrome of bilateral lacrimal glands: Secondary | ICD-10-CM | POA: Diagnosis not present

## 2019-12-26 DIAGNOSIS — H25813 Combined forms of age-related cataract, bilateral: Secondary | ICD-10-CM | POA: Diagnosis not present

## 2019-12-26 DIAGNOSIS — H401112 Primary open-angle glaucoma, right eye, moderate stage: Secondary | ICD-10-CM | POA: Diagnosis not present

## 2020-01-05 ENCOUNTER — Other Ambulatory Visit: Payer: Self-pay | Admitting: Family Medicine

## 2020-01-05 DIAGNOSIS — Z5181 Encounter for therapeutic drug level monitoring: Secondary | ICD-10-CM

## 2020-01-05 NOTE — Telephone Encounter (Signed)
Requested medication (s) are due for refill today: yes  Requested medication (s) are on the active medication list: yes  Last refill:  last filled by Dr. Nolon Rod  Future visit scheduled: no  Notes to clinic:  Please review for refill. Previously filled by Dr. Nolon Rod    Requested Prescriptions  Pending Prescriptions Disp Refills   esomeprazole ([place]) 40 MG capsule [Pharmacy Med Name: ESOMEPRAZOLE MAGNESIUM 40MG  DR CAPS] 90 capsule 1    Sig: TAKE 1 CAPSULE BY MOUTH DAILY AT 12 NOON      Gastroenterology: Proton Pump Inhibitors Passed - 01/05/2020  6:35 AM      Passed - Valid encounter within last 12 months    Recent Outpatient Visits           2 months ago Postnasal discharge   Primary Care at [facility], [place] A, MD   2 months ago No-show for appointment   Primary Care at [facility], Arlie Solomons, MD   2 months ago Medicare annual wellness visit, subsequent   Primary Care at [facility], Arlie Solomons, MD   4 months ago Dyslipidemia   Primary Care at [place], [place]   5 months ago Anxiety and depression   Primary Care at [facility], [place] A, MD                diltiazem (CARDIZEM CD) 180 MG 24 hr capsule [Pharmacy Med Name: DILTIAZEM CD 180MG  CAPSULES (24 HR)] 90 capsule 1    Sig: TAKE 1 CAPSULE(180 MG) BY MOUTH DAILY      Cardiovascular:  Calcium Channel Blockers Passed - 01/05/2020  6:35 AM      Passed - Last BP in normal range    BP Readings from Last 1 Encounters:  10/08/19 139/67          Passed - Valid encounter within last 6 months    Recent Outpatient Visits           2 months ago Postnasal discharge   Primary Care at [place], MD   2 months ago No-show for appointment   Primary Care at [facility], Arlie Solomons, MD   2 months ago Medicare annual wellness visit, subsequent   Primary Care at [facility], Arlie Solomons, MD   4 months ago Dyslipidemia   Primary Care at [place], MD   5 months ago Anxiety  and depression   Primary Care at [place], MD                rosuvastatin (CRESTOR) 20 MG tablet [Pharmacy Med Name: ROSUVASTATIN 20MG  TABLETS] 90 tablet 3    Sig: TAKE 1 TABLET(20 MG) BY MOUTH AT BEDTIME      Cardiovascular:  Antilipid - Statins Failed - 01/05/2020  6:35 AM      Failed - Total Cholesterol in normal range and within 360 days    Cholesterol, Total  Date Value Ref Range Status  08/28/2019 227 (H) 100 - 199 mg/dL Final          Failed - LDL in normal range and within 360 days    LDL Chol Calc (NIH)  Date Value Ref Range Status  08/28/2019 140 (H) 0 - 99 mg/dL Final          Passed - HDL in normal range and within 360 days    HDL  Date Value Ref Range Status  08/28/2019 72 >39 mg/dL Final  Passed - Triglycerides in normal range and within 360 days    Triglycerides  Date Value Ref Range Status  08/28/2019 89 0 - 149 mg/dL Final          Passed - Patient is not pregnant      Passed - Valid encounter within last 12 months    Recent Outpatient Visits           2 months ago Postnasal discharge   Primary Care at [facility], Arlie Solomons, MD   2 months ago No-show for appointment   Primary Care at [facility], Arlie Solomons, MD   2 months ago Medicare annual wellness visit, subsequent   Primary Care at [facility], Arlie Solomons, MD   4 months ago Dyslipidemia   Primary Care at [place], MD   5 months ago Anxiety and depression   Primary Care at [facility], Arlie Solomons, MD

## 2020-01-08 DIAGNOSIS — D509 Iron deficiency anemia, unspecified: Secondary | ICD-10-CM | POA: Diagnosis not present

## 2020-01-08 DIAGNOSIS — I10 Essential (primary) hypertension: Secondary | ICD-10-CM | POA: Diagnosis not present

## 2020-01-08 DIAGNOSIS — R42 Dizziness and giddiness: Secondary | ICD-10-CM | POA: Diagnosis not present

## 2020-01-08 DIAGNOSIS — G43B0 Ophthalmoplegic migraine, not intractable: Secondary | ICD-10-CM | POA: Diagnosis not present

## 2020-01-08 DIAGNOSIS — E785 Hyperlipidemia, unspecified: Secondary | ICD-10-CM | POA: Diagnosis not present

## 2020-01-09 DIAGNOSIS — D509 Iron deficiency anemia, unspecified: Secondary | ICD-10-CM | POA: Diagnosis not present

## 2020-01-16 ENCOUNTER — Other Ambulatory Visit: Payer: Self-pay | Admitting: Family Medicine

## 2020-01-16 DIAGNOSIS — G43109 Migraine with aura, not intractable, without status migrainosus: Secondary | ICD-10-CM

## 2020-01-16 DIAGNOSIS — H401112 Primary open-angle glaucoma, right eye, moderate stage: Secondary | ICD-10-CM | POA: Diagnosis not present

## 2020-01-16 DIAGNOSIS — H25813 Combined forms of age-related cataract, bilateral: Secondary | ICD-10-CM | POA: Diagnosis not present

## 2020-01-16 DIAGNOSIS — H04123 Dry eye syndrome of bilateral lacrimal glands: Secondary | ICD-10-CM | POA: Diagnosis not present

## 2020-01-23 DIAGNOSIS — G4733 Obstructive sleep apnea (adult) (pediatric): Secondary | ICD-10-CM | POA: Diagnosis not present

## 2020-01-31 DIAGNOSIS — T63481A Toxic effect of venom of other arthropod, accidental (unintentional), initial encounter: Secondary | ICD-10-CM | POA: Diagnosis not present

## 2020-01-31 DIAGNOSIS — L509 Urticaria, unspecified: Secondary | ICD-10-CM | POA: Diagnosis not present

## 2020-02-14 DIAGNOSIS — H5703 Miosis: Secondary | ICD-10-CM | POA: Diagnosis not present

## 2020-02-14 DIAGNOSIS — H25812 Combined forms of age-related cataract, left eye: Secondary | ICD-10-CM | POA: Diagnosis not present

## 2020-02-14 DIAGNOSIS — H268 Other specified cataract: Secondary | ICD-10-CM | POA: Diagnosis not present

## 2020-02-14 DIAGNOSIS — H401121 Primary open-angle glaucoma, left eye, mild stage: Secondary | ICD-10-CM | POA: Diagnosis not present

## 2020-02-20 ENCOUNTER — Ambulatory Visit
Admission: RE | Admit: 2020-02-20 | Discharge: 2020-02-20 | Disposition: A | Discharge status: Home / Self Care | Payer: Medicare Other | Source: Ambulatory Visit | Attending: Neurological Surgery | Admitting: Neurological Surgery

## 2020-02-20 ENCOUNTER — Other Ambulatory Visit: Payer: Self-pay

## 2020-02-20 DIAGNOSIS — G43109 Migraine with aura, not intractable, without status migrainosus: Secondary | ICD-10-CM

## 2020-02-22 ENCOUNTER — Ambulatory Visit: Admission: RE | Admit: 2020-02-22 | Payer: Medicare Other | Source: Ambulatory Visit

## 2020-02-22 ENCOUNTER — Other Ambulatory Visit: Payer: Self-pay

## 2020-02-22 DIAGNOSIS — G4733 Obstructive sleep apnea (adult) (pediatric): Secondary | ICD-10-CM | POA: Diagnosis not present

## 2020-04-12 ENCOUNTER — Ambulatory Visit
Admission: RE | Admit: 2020-04-12 | Discharge: 2020-04-12 | Disposition: A | Discharge status: Home / Self Care | Payer: Medicare Other | Source: Ambulatory Visit | Attending: Neurological Surgery | Admitting: Neurological Surgery

## 2020-04-12 DIAGNOSIS — G43109 Migraine with aura, not intractable, without status migrainosus: Secondary | ICD-10-CM

## 2020-04-12 MED ORDER — GADOBENATE DIMEGLUMINE 529 MG/ML IV SOLN
20.0000 mL | Freq: Once | INTRAVENOUS | Status: AC | PRN
  Administered 2020-04-12: 20 mL via INTRAVENOUS

## 2020-06-12 ENCOUNTER — Ambulatory Visit: Payer: Medicare Other | Attending: Internal Medicine

## 2020-06-12 DIAGNOSIS — Z23 Encounter for immunization: Secondary | ICD-10-CM

## 2020-06-12 NOTE — Progress Notes (Signed)
   Covid-19 Vaccination Clinic  Name:  QUYNN VILCHIS    MRN: [id number] DOB: Jan 10, 1946  06/12/2020  Ms. Alberta was observed post Covid-19 immunization for 15 minutes without incident. She was provided with Vaccine Information Sheet and instruction to access the V-Safe system.   Ms. Marland was instructed to call 911 with any severe reactions post vaccine: Marland Kitchen Difficulty breathing  . Swelling of face and throat  . A fast heartbeat  . A bad rash all over body  . Dizziness and weakness

## 2020-07-21 ENCOUNTER — Other Ambulatory Visit: Payer: Self-pay | Admitting: *Deleted

## 2020-07-21 MED ORDER — DILTIAZEM HCL ER COATED BEADS 180 MG PO CP24: ORAL_CAPSULE | ORAL | 1 refills | Status: DC

## 2020-08-11 ENCOUNTER — Other Ambulatory Visit: Payer: Self-pay | Admitting: Family Medicine

## 2020-08-11 DIAGNOSIS — Z1231 Encounter for screening mammogram for malignant neoplasm of breast: Secondary | ICD-10-CM

## 2020-08-12 ENCOUNTER — Ambulatory Visit
Admission: RE | Admit: 2020-08-12 | Discharge: 2020-08-12 | Disposition: A | Discharge status: Home / Self Care | Payer: Medicare Other | Source: Ambulatory Visit | Attending: Family Medicine | Admitting: Family Medicine

## 2020-08-12 ENCOUNTER — Other Ambulatory Visit: Payer: Self-pay

## 2020-08-12 DIAGNOSIS — Z1231 Encounter for screening mammogram for malignant neoplasm of breast: Secondary | ICD-10-CM

## 2020-11-28 ENCOUNTER — Encounter (HOSPITAL_COMMUNITY): Payer: Self-pay | Admitting: Emergency Medicine

## 2020-11-28 ENCOUNTER — Ambulatory Visit (HOSPITAL_COMMUNITY)
Admission: EM | Admit: 2020-11-28 | Discharge: 2020-11-28 | Disposition: A | Discharge status: Home / Self Care | Payer: Medicare Other | Attending: Emergency Medicine | Admitting: Emergency Medicine

## 2020-11-28 ENCOUNTER — Other Ambulatory Visit: Payer: Self-pay

## 2020-11-28 DIAGNOSIS — R21 Rash and other nonspecific skin eruption: Secondary | ICD-10-CM | POA: Diagnosis not present

## 2020-11-28 MED ORDER — TRIAMCINOLONE ACETONIDE 0.1 % EX CREA: 1.0000 "application " | TOPICAL_CREAM | Freq: Two times a day (BID) | CUTANEOUS | 0 refills | Status: DC

## 2020-11-28 NOTE — ED Triage Notes (Addendum)
Pt states that she has a rash on her left/right wrist and left shoulder. Pt states that she noticed the rash two days ago. Pt states that the rash does not cause pain but does itch

## 2020-11-28 NOTE — ED Provider Notes (Signed)
[place]    CSN: [id number] Arrival date & time: 11/28/20  0848      History   Chief Complaint Chief Complaint  Patient presents with  . Rash    HPI Alyssa Jefferson is a 75 y.o. female.   Patient here for evaluation of rash to left shoulder, left wrist, and right wrist.  Reports rash has been ongoing for the past several days.  Reports using Benadryl p.o. and hydrocortisone cream.  Reports Benadryl is effective in helping with itch but hydrocortisone cream has not been helpful.  Denies any exposures to allergens.  Reports similar rash last year and was given cream by PCP but unsure what that cream was. Denies any specific alleviating or aggravating factors.  Denies any fevers, chest pain, shortness of breath, N/V/D, numbness, tingling, weakness, abdominal pain, or headaches.   ROS: As per HPI, all other pertinent ROS negative  The history is provided by the patient.  Rash   Past Medical History:  Diagnosis Date  . Arthritis    knees, back  . Cancer [facility]) 2004   breast  . Depression   . GERD (gastroesophageal reflux disease)   . Glaucoma   . Heart palpitations   . High cholesterol   . History of transfusion    many years ago  . Hypertension   . Hypothyroidism   . Itching in the vaginal area   . Nocturia   . OSA (obstructive sleep apnea)    tried CPAP- but unsuccessful  . Renal disorder   . Thyroid disease    thyroid removed    Patient Active Problem List   Diagnosis Date Noted  . CKD (chronic kidney disease), stage III ([place]) 09/12/2017  . Secondary hyperparathyroidism ([place]) 09/12/2017  . OA (osteoarthritis) of knee 07/28/2015  . Palpitations 10/17/2014  . OSA (obstructive sleep apnea) 10/16/2012  . BMI 40.0-44.9, adult ([place]) 12/20/2011  . Hypothyroid 12/20/2011  . 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 Osteoarthritis, knee 12/20/2011  . Other and unspecified hyperlipidemia 12/20/2011  . Depressive disorder, not  elsewhere classified 12/20/2011  . Unspecified essential hypertension 12/20/2011  . Anemia 12/20/2011  . Cancer [facility])     Past Surgical History:  Procedure Laterality Date  . ABDOMINAL HYSTERECTOMY  82   BSO  . BREAST BIOPSY Right pt doesnt remember   benign  . BREAST SURGERY  2004   left breast removed due to cancer no chemo or radiation  . MASTECTOMY Left   . THYROIDECTOMY     due to knot  . TOTAL KNEE ARTHROPLASTY Left 07/28/2015   Procedure: LEFT TOTAL KNEE ARTHROPLASTY;  Surgeon: Gaynelle Arabian, MD;  Location: [facility];  Service: Orthopedics;  Laterality: Left;    OB History    Gravida  0   Para      Term      Preterm      AB      Living        SAB      IAB      Ectopic      Multiple      Live Births               Home Medications    Prior to Admission medications   Medication Sig Start Date End Date Taking? Authorizing Provider  triamcinolone cream (KENALOG) 0.1 % Apply 1 application topically 2 (two) times daily. 11/28/20  Yes Pearson Forster, NP  albuterol (VENTOLIN HFA) 108 (90  Base) MCG/ACT inhaler Inhale 2 puffs into the lungs every 6 (six) hours as needed for wheezing or shortness of breath. 01/01/19   Forrest Moron, MD  cetirizine (ZYRTEC) 10 MG tablet Take 1 tablet (10 mg total) by mouth daily. 10/17/19   Forrest Moron, MD  diltiazem (CARDIZEM CD) 180 MG 24 hr capsule TAKE 1 CAPSULE(180 MG) BY MOUTH DAILY 07/21/20   Horald Pollen, MD  escitalopram (LEXAPRO) 10 MG tablet Take 1 tablet (10 mg total) by mouth daily. 08/28/19   Forrest Moron, MD  esomeprazole (NEXIUM) 40 MG capsule TAKE 1 CAPSULE BY MOUTH DAILY AT 12 NOON 01/07/20   Stallings, Zoe A, MD  Ferrous Sulfate (SLOW FE) 142 (45 Fe) MG TBCR Take 1 tablet by mouth daily. 06/22/19   Forrest Moron, MD  latanoprost (XALATAN) 0.005 % ophthalmic solution Place 1 drop into both eyes at bedtime. 01/01/19   Forrest Moron, MD  levothyroxine (SYNTHROID) 50 MCG tablet Take 1 tablet (50  mcg total) by mouth daily. 10/17/19   Forrest Moron, MD  rosuvastatin (CRESTOR) 20 MG tablet TAKE 1 TABLET(20 MG) BY MOUTH AT BEDTIME 01/07/20   Delia Chimes A, MD  spironolactone (ALDACTONE) 50 MG tablet Take one tablet by mouth daily 10/17/19   Forrest Moron, MD    Family History Family History  Problem Relation Age of Onset  . Lung cancer Father   . Breast cancer Sister   . Ovarian cancer Sister   . Colon cancer Neg Hx   . Esophageal cancer Neg Hx   . Stomach cancer Neg Hx   . Pancreatic cancer Neg Hx   . Liver disease Neg Hx     Social History Social History   Tobacco Use  . Smoking status: Former Smoker    Years: 20.00    Types: Cigarettes    Quit date: 08/16/1978    Years since quitting: 42.3  . Smokeless tobacco: Never Used  . Tobacco comment: 1 pack per week--10/16/12  Vaping Use  . Vaping Use: Never used  Substance Use Topics  . Alcohol use: No  . Drug use: No     Allergies   Codeine and Lipitor [atorvastatin calcium]   Review of Systems Review of Systems  Skin: Positive for rash.  All other systems reviewed and are negative.    Physical Exam Triage Vital Signs ED Triage Vitals  Enc Vitals Group     BP 11/28/20 0949 (!) 156/75     Pulse Rate 11/28/20 0949 (!) 52     Resp 11/28/20 0949 17     Temp 11/28/20 0949 97.8 F (36.6 C)     Temp Source 11/28/20 0949 Oral     SpO2 11/28/20 0949 99 %     Weight --      Height --      Head Circumference --      Peak Flow --      Pain Score 11/28/20 0947 0     Pain Loc --      Pain Edu? --      Excl. in [place]? --    No data found.  Updated Vital Signs BP (!) 156/75 (BP Location: Right Arm)   Pulse (!) 52   Temp 97.8 F (36.6 C) (Oral)   Resp 17   SpO2 99%   Visual Acuity Right Eye Distance:   Left Eye Distance:   Bilateral Distance:    Right Eye Near:   Left Eye Near:  Bilateral Near:     Physical Exam Vitals and nursing note reviewed.  Constitutional:      General: She is not in  acute distress.    Appearance: Normal appearance. She is not ill-appearing, toxic-appearing or diaphoretic.  HENT:     Head: Normocephalic and atraumatic.  Eyes:     Conjunctiva/sclera: Conjunctivae normal.  Cardiovascular:     Rate and Rhythm: Normal rate.     Pulses: Normal pulses.  Pulmonary:     Effort: Pulmonary effort is normal.  Abdominal:     General: Abdomen is flat.  Musculoskeletal:        General: Normal range of motion.     Cervical back: Normal range of motion.  Skin:    General: Skin is warm and dry.     Findings: Rash present. Rash is urticarial.     Comments: Small 2-3 cm urticarial rash to left shoulder and bilateral wrists, denies any pain but does endorse itching  Neurological:     General: No focal deficit present.     Mental Status: She is alert and oriented to person, place, and time.  Psychiatric:        Mood and Affect: Mood normal.      UC Treatments / Results  Labs (all labs ordered are listed, but only abnormal results are displayed) Labs Reviewed - No data to display  EKG   Radiology No results found.  Procedures Procedures (including critical care time)  Medications Ordered in UC Medications - No data to display  Initial Impression / Assessment and Plan / UC Course  I have reviewed the triage vital signs and the nursing notes.  Pertinent labs & imaging results that were available during my care of the patient were reviewed by me and considered in my medical decision making (see chart for details).     Assessment negative for red flags or concerns including shingles.  Rash likely due to exposure to allergen.  Kenalog cream as needed for itch.  May continue to take Benadryl as needed.  Continue taking Zyrtec daily.  May apply cool rags for comfort.  Follow-up with primary care provider.    Final Clinical Impressions(s) / UC Diagnoses   Final diagnoses:  Rash and nonspecific skin eruption     Discharge Instructions     Use  the Kenalog cream twice a day as needed for itch.  You may take Benadryl as needed for itch as well.  Continue to take the Zyrtec daily.  Follow-up with your primary care provider.     ED Prescriptions    Medication Sig Dispense Auth. Provider   triamcinolone cream (KENALOG) 0.1 % Apply 1 application topically 2 (two) times daily. 30 g Pearson Forster, NP     PDMP not reviewed this encounter.   Pearson Forster, NP 11/28/20 1015

## 2020-11-28 NOTE — Discharge Instructions (Addendum)
Use the Kenalog cream twice a day as needed for itch.  You may take Benadryl as needed for itch as well.  Continue to take the Zyrtec daily.  Follow-up with your primary care provider.

## 2020-12-01 ENCOUNTER — Encounter: Payer: Self-pay | Admitting: Physician Assistant

## 2020-12-01 ENCOUNTER — Ambulatory Visit: Payer: Medicare Other | Admitting: Physician Assistant

## 2020-12-01 VITALS — BP 120/68 | HR 52 | Ht 62.0 in | Wt 224.0 lb

## 2020-12-01 DIAGNOSIS — K5900 Constipation, unspecified: Secondary | ICD-10-CM

## 2020-12-01 DIAGNOSIS — K625 Hemorrhage of anus and rectum: Secondary | ICD-10-CM

## 2020-12-01 MED ORDER — NA SULFATE-K SULFATE-MG SULF 17.5-3.13-1.6 GM/177ML PO SOLN: 1.0000 | Freq: Once | ORAL | 0 refills | Status: AC

## 2020-12-01 NOTE — Patient Instructions (Addendum)
If you are age 74 or older, your body mass index should be between 23-30. Your Body mass index is 40.97 kg/m. If this is out of the aforementioned range listed, please consider follow up with your Primary Care Provider.  If you are age 43 or younger, your body mass index should be between 19-25. Your Body mass index is 40.97 kg/m. If this is out of the aformentioned range listed, please consider follow up with your Primary Care Provider.   You have been scheduled for a colonoscopy. Please follow written instructions given to you at your visit today.  Please pick up your prep supplies at the pharmacy within the next 1-3 days. If you use inhalers (even only as needed), please bring them with you on the day of your procedure.   Please start Miralax once daily.  It was a pleasure to see you today!  Ellouise Newer, PA-C

## 2020-12-01 NOTE — Progress Notes (Signed)
Chief Complaint: Rectal bleeding  HPI:    Alyssa Jefferson is a 75 year old African-American female with a past medical history as listed below including breast cancer, reflux and obstructive sleep apnea, known to Dr. Loletha Carrow, who was referred to me by Forrest Moron, MD for a complaint of rectal bleeding.      07/23/2014 colonoscopy at [facility] gastroenterology with internal hemorrhoids and multiple small mouth diverticula in the sigmoid colon.  Repeat recommended in 10 years.    05/22/2018 office visit with Dr. Loletha Carrow for abdominal pain.  At that time discussed chronic constipation and intermittent upper abdominal pain.  Was recommended she use daily MiraLAX.    Today, the patient presents to clinic and tells me that about 4 to 5 weeks ago she started with rectal bleeding with a bowel movement.  It was very bright red blood on the stool and on the toilet paper which she would see every day.  This lasted for 2 to 3 weeks and then stopped on its own and over the past 2 weeks she has seen no further blood.  Also felt nauseous occasionally during that time, typically after a bowel movement when she would go to sit back down or lay back down.  Continues with her chronic constipation.  Tried something mixed with water that "turned into a goo", unable to take this on a daily basis so right now she does not use anything but does tell me she strains a lot for her stool.    Denies fever, chills, weight loss, vomiting, abdominal pain or symptoms that awaken her from sleep.  Past Medical History:  Diagnosis Date  . Arthritis    knees, back  . Cancer [facility]) 2004   breast  . Depression   . GERD (gastroesophageal reflux disease)   . Glaucoma   . Heart palpitations   . High cholesterol   . History of transfusion    many years ago  . Hypertension   . Hypothyroidism   . Itching in the vaginal area   . Nocturia   . OSA (obstructive sleep apnea)    tried CPAP- but unsuccessful  . Renal disorder   . Thyroid  disease    thyroid removed    Past Surgical History:  Procedure Laterality Date  . ABDOMINAL HYSTERECTOMY  83   BSO  . BREAST BIOPSY Right pt doesnt remember   benign  . BREAST SURGERY  2004   left breast removed due to cancer no chemo or radiation  . MASTECTOMY Left   . THYROIDECTOMY     due to knot  . TOTAL KNEE ARTHROPLASTY Left 07/28/2015   Procedure: LEFT TOTAL KNEE ARTHROPLASTY;  Surgeon: Gaynelle Arabian, MD;  Location: [facility];  Service: Orthopedics;  Laterality: Left;    Current Outpatient Medications  Medication Sig Dispense Refill  . albuterol (VENTOLIN HFA) 108 (90 Base) MCG/ACT inhaler Inhale 2 puffs into the lungs every 6 (six) hours as needed for wheezing or shortness of breath. 1 Inhaler 0  . diltiazem (CARDIZEM CD) 180 MG 24 hr capsule TAKE 1 CAPSULE(180 MG) BY MOUTH DAILY 90 capsule 1  . escitalopram (LEXAPRO) 10 MG tablet Take 1 tablet (10 mg total) by mouth daily. 30 tablet 1  . esomeprazole (NEXIUM) 40 MG capsule TAKE 1 CAPSULE BY MOUTH DAILY AT 12 NOON 90 capsule 1  . latanoprost (XALATAN) 0.005 % ophthalmic solution Place 1 drop into both eyes at bedtime. 2.5 mL 6  . levothyroxine (SYNTHROID) 50 MCG tablet Take  1 tablet (50 mcg total) by mouth daily. 90 tablet 3  . rosuvastatin (CRESTOR) 20 MG tablet TAKE 1 TABLET(20 MG) BY MOUTH AT BEDTIME 90 tablet 3  . spironolactone (ALDACTONE) 50 MG tablet Take one tablet by mouth daily 90 tablet 1  . triamcinolone cream (KENALOG) 0.1 % Apply 1 application topically 2 (two) times daily. 30 g 0  . Ferrous Sulfate (SLOW FE) 142 (45 Fe) MG TBCR Take 1 tablet by mouth daily. (Patient not taking: Reported on 12/01/2020) 30 tablet 3   No current facility-administered medications for this visit.    Allergies as of 12/01/2020 - Review Complete 12/01/2020  Allergen Reaction Noted  . Codeine Other (See Comments)   . Lipitor [atorvastatin calcium] Itching     Family History  Problem Relation Age of Onset  . Lung cancer Father    . Breast cancer Sister   . Ovarian cancer Sister   . Colon cancer Neg Hx   . Esophageal cancer Neg Hx   . Stomach cancer Neg Hx   . Pancreatic cancer Neg Hx   . Liver disease Neg Hx     Social History   Socioeconomic History  . Marital status: Widowed    Spouse name: Not on file  . Number of children: 5  . Years of education: Not on file  . Highest education level: Not on file  Occupational History  . Occupation: retired    Fish farm manager: RETIRED  Tobacco Use  . Smoking status: Former Smoker    Years: 20.00    Types: Cigarettes    Quit date: 08/16/1978    Years since quitting: 42.3  . Smokeless tobacco: Never Used  . Tobacco comment: 1 pack per week--10/16/12  Vaping Use  . Vaping Use: Never used  Substance and Sexual Activity  . Alcohol use: No  . Drug use: No  . Sexual activity: Not Currently  Other Topics Concern  . Not on file  Social History Narrative  . Not on file   Social Determinants of Health   Financial Resource Strain: Not on file  Food Insecurity: Not on file  Transportation Needs: Not on file  Physical Activity: Not on file  Stress: Not on file  Social Connections: Not on file  Intimate Partner Violence: Not on file    Review of Systems:    Constitutional: No weight loss, fever or chills Cardiovascular: No chest pain Respiratory: No SOB Gastrointestinal: See HPI and otherwise negative   Physical Exam:  Vital signs: BP 120/68   Pulse (!) 52   Ht 5\' 2"  (1.575 m)   Wt 224 lb (101.6 kg)   SpO2 99%   BMI 40.97 kg/m   Constitutional:   Pleasant overweight AA female appears to be in NAD, Well developed, Well nourished, alert and cooperative Respiratory: Respirations even and unlabored. Lungs clear to auscultation bilaterally.   No wheezes, crackles, or rhonchi.  Cardiovascular: Normal S1, S2. No MRG. Regular rate and rhythm. No peripheral edema, cyanosis or pallor.  Gastrointestinal:  Soft, nondistended, nontender. No rebound or guarding. Normal  bowel sounds. No appreciable masses or hepatomegaly. Rectal:  Not performed.  Psychiatric: Demonstrates good judgement and reason without abnormal affect or behaviors.  No recent labs or imaging.  Assessment: 1.  Rectal bleeding: Likely from hemorrhoids given history of constipation and straining on hemorrhoids on last colonoscopy in 2015; consider hemorrhoids most likely versus polyps versus other 2. Chronic Constipation  Plan: 1.  Scheduled patient for diagnostic colonoscopy in the [place] with  Dr. Loletha Carrow.  Patient was provided with a detailed list of risks for the procedure and she agrees to proceed. 2.  Recommend the patient use MiraLAX up to 4 times a day for chronic constipation.  Discussed starting with 1 dose daily for the next week or so and then titrating upward. 3.  Patient to follow in clinic per recommendations from Dr. Loletha Carrow after time of procedure.  Ellouise Newer, PA-C [facility] 12/01/2020, 8:46 AM  Cc: Forrest Moron, MD

## 2020-12-08 NOTE — Progress Notes (Signed)
____________________________________________________________  Attending physician addendum:  Thank you for sending this case to me. I have reviewed the entire note and agree with the plan.   Sherree Shankman Danis, MD  ____________________________________________________________  

## 2020-12-10 ENCOUNTER — Encounter: Payer: Self-pay | Admitting: Gastroenterology

## 2020-12-10 ENCOUNTER — Other Ambulatory Visit: Payer: Self-pay

## 2020-12-10 ENCOUNTER — Ambulatory Visit (AMBULATORY_SURGERY_CENTER): Payer: Medicare Other | Admitting: Gastroenterology

## 2020-12-10 VITALS — BP 140/57 | HR 52 | Temp 97.9°F | Resp 18 | Ht 62.0 in | Wt 224.0 lb

## 2020-12-10 DIAGNOSIS — K5909 Other constipation: Secondary | ICD-10-CM

## 2020-12-10 DIAGNOSIS — K625 Hemorrhage of anus and rectum: Secondary | ICD-10-CM

## 2020-12-10 DIAGNOSIS — K5730 Diverticulosis of large intestine without perforation or abscess without bleeding: Secondary | ICD-10-CM | POA: Diagnosis not present

## 2020-12-10 DIAGNOSIS — K648 Other hemorrhoids: Secondary | ICD-10-CM

## 2020-12-10 MED ORDER — SODIUM CHLORIDE 0.9 % IV SOLN: 500.0000 mL | Freq: Once | INTRAVENOUS | Status: DC

## 2020-12-10 NOTE — Progress Notes (Signed)
Pt Drowsy. VSS. To PACU, report to RN. No anesthetic complications noted.  

## 2020-12-10 NOTE — Patient Instructions (Signed)
Discharge instructions given. Handouts on Diverticulosis and Hemorrhoids. Resume previous medications. Office will call and schedule follow up appointment. Miralax 1 capful (17grams) in 12-16 ounces of water by mouth daily. Increase to twice daily if needed. YOU HAD AN ENDOSCOPIC PROCEDURE TODAY AT [facility]:   Refer to the procedure report that was given to you for any specific questions about what was found during the examination.  If the procedure report does not answer your questions, please call your gastroenterologist to clarify.  If you requested that your care partner not be given the details of your procedure findings, then the procedure report has been included in a sealed envelope for you to review at your convenience later.  YOU SHOULD EXPECT: Some feelings of bloating in the abdomen. Passage of more gas than usual.  Walking can help get rid of the air that was put into your GI tract during the procedure and reduce the bloating. If you had a lower endoscopy (such as a colonoscopy or flexible sigmoidoscopy) you may notice spotting of blood in your stool or on the toilet paper. If you underwent a bowel prep for your procedure, you may not have a normal bowel movement for a few days.  Please Note:  You might notice some irritation and congestion in your nose or some drainage.  This is from the oxygen used during your procedure.  There is no need for concern and it should clear up in a day or so.  SYMPTOMS TO REPORT IMMEDIATELY:   Following lower endoscopy (colonoscopy or flexible sigmoidoscopy):  Excessive amounts of blood in the stool  Significant tenderness or worsening of abdominal pains  Swelling of the abdomen that is new, acute  Fever of 100F or higher   For urgent or emergent issues, a gastroenterologist can be reached at any hour by calling [phone]. Do not use MyChart messaging for urgent concerns.    DIET:  We do recommend a small meal at first,  but then you may proceed to your regular diet.  Drink plenty of fluids but you should avoid alcoholic beverages for 24 hours.  ACTIVITY:  You should plan to take it easy for the rest of today and you should NOT DRIVE or use heavy machinery until tomorrow (because of the sedation medicines used during the test).    FOLLOW UP: Our staff will call the number listed on your records 48-72 hours following your procedure to check on you and address any questions or concerns that you may have regarding the information given to you following your procedure. If we do not reach you, we will leave a message.  We will attempt to reach you two times.  During this call, we will ask if you have developed any symptoms of COVID 19. If you develop any symptoms (ie: fever, flu-like symptoms, shortness of breath, cough etc.) before then, please call [phone].  If you test positive for Covid 19 in the 2 weeks post procedure, please call and report this information to [place].    If any biopsies were taken you will be contacted by phone or by letter within the next 1-3 weeks.  Please call us at [phone] if you have not heard about the biopsies in 3 weeks.    SIGNATURES/CONFIDENTIALITY: You and/or your care partner have signed paperwork which will be entered into your electronic medical record.  These signatures attest to the fact that that the information above on your After Visit Summary has been reviewed  and is understood.  Full responsibility of the confidentiality of this discharge information lies with you and/or your care-partner. 

## 2020-12-10 NOTE — Op Note (Signed)
[facility] Patient Name: Alyssa Jefferson Procedure Date: 12/10/2020 8:30 AM MRN: [id number] Endoscopist: Mallie Mussel L. Loletha Carrow , MD Age: 75 Referring MD:  Date of Birth: 1945/10/26 Gender: Female Account #: [id number] Procedure:                Colonoscopy Indications:              Rectal bleeding, Constipation (patient reports she                            has not been using miralax as directed because it                            is too "thick") Medicines:                Monitored Anesthesia Care Procedure:                Pre-Anesthesia Assessment:                           - Prior to the procedure, a History and Physical                            was performed, and patient medications and                            allergies were reviewed. The patient's tolerance of                            previous anesthesia was also reviewed. The risks                            and benefits of the procedure and the sedation                            options and risks were discussed with the patient.                            All questions were answered, and informed consent                            was obtained. Prior Anticoagulants: The patient has                            taken no previous anticoagulant or antiplatelet                            agents. ASA Grade Assessment: II - A patient with                            mild systemic disease. After reviewing the risks                            and benefits, the patient was deemed in  satisfactory condition to undergo the procedure.                           After obtaining informed consent, the colonoscope                            was passed under direct vision. Throughout the                            procedure, the patient's blood pressure, pulse, and                            oxygen saturations were monitored continuously. The                            Olympus CF-HQ190 [id number]) Colonoscope was                             introduced through the anus and advanced to the the                            cecum, identified by appendiceal orifice and                            ileocecal valve. The colonoscopy was performed                            without difficulty. The patient tolerated the                            procedure well. The quality of the bowel                            preparation was good. The ileocecal valve,                            appendiceal orifice, and rectum were photographed. Scope In: 8:37:44 AM Scope Out: 8:49:31 AM Scope Withdrawal Time: 0 hours 8 minutes 53 seconds  Total Procedure Duration: 0 hours 11 minutes 47 seconds  Findings:                 The perianal and digital rectal examinations were                            normal.                           Multiple diverticula were found in the left colon                            (many) and right colon (few). Associated haustral                            thickening.  Internal hemorrhoids were found. The hemorrhoids                            were medium-sized.                           The exam was otherwise without abnormality on                            direct and retroflexion views. Complications:            No immediate complications. Estimated Blood Loss:     Estimated blood loss: none. Impression:               - Diverticulosis in the left colon and in the right                            colon.                           - Internal hemorrhoids. (cause of bleeding)                           - The examination was otherwise normal on direct                            and retroflexion views.                           - No specimens collected. Recommendation:           - Patient has a contact number available for                            emergencies. The signs and symptoms of potential                            delayed complications were discussed with the                             patient. Return to normal activities tomorrow.                            Written discharge instructions were provided to the                            patient.                           - Resume previous diet.                           - Continue present medications.                           - No repeat routine screening colonoscopy due to  current guidelines, age and the absence of colonic                            polyps.                           - Return to my office at appointment to be                            scheduled. Hemorrhoidal bleeding should subside if                            constipation can be relieved. If not, hemorrhoidal                            banding will be offered.                           - Miralax 1 capful (17 grams) in 12-16 ounces of                            water PO daily. Increase to twice daily if needed.                            Stir well to completely dissolve the powder into                            solution. Uzziel Russey L. Loletha Carrow, MD 12/10/2020 8:58:27 AM This report has been signed electronically.

## 2020-12-10 NOTE — Progress Notes (Signed)
Vital signs checked by:CW ? ?The medical and surgical history was reviewed and verified with the patient. ? ?

## 2020-12-11 ENCOUNTER — Telehealth: Payer: Self-pay

## 2020-12-11 NOTE — Telephone Encounter (Signed)
Per procedure report from 12/10/20 - Return to GI office at appointment to be scheduled.    Patient has been scheduled for a follow up with Dr. Loletha Carrow on Tuesday, 12/30/20 at 8:40 AM. Letter mailed and sent via my chart with appointment information.

## 2020-12-12 ENCOUNTER — Telehealth: Payer: Self-pay

## 2020-12-12 NOTE — Telephone Encounter (Signed)
  Follow up Call-  Call back number 12/10/2020  Post procedure Call Back phone  # [phone]  Permission to leave phone message Yes  Some recent data might be hidden     Patient questions:  Do you have a fever, pain , or abdominal swelling? No. Pain Score  0 *  Have you tolerated food without any problems? Yes.    Have you been able to return to your normal activities? Yes.    Do you have any questions about your discharge instructions: Diet   No. Medications  No. Follow up visit  No.  Do you have questions or concerns about your Care? No.  Actions: * If pain score is 4 or above: No action needed, pain <4. 1. Have you developed a fever since your procedure? no  2.   Have you had an respiratory symptoms (SOB or cough) since your procedure? no  3.   Have you tested positive for COVID 19 since your procedure no  4.   Have you had any family members/close contacts diagnosed with the COVID 19 since your procedure?  no   If yes to any of these questions please route to Joylene John, RN and Joella Prince, RN

## 2020-12-30 ENCOUNTER — Encounter: Payer: Self-pay | Admitting: Gastroenterology

## 2020-12-30 ENCOUNTER — Ambulatory Visit: Payer: Medicare Other | Admitting: Gastroenterology

## 2020-12-30 VITALS — BP 122/74 | HR 52 | Ht 62.0 in | Wt 225.2 lb

## 2020-12-30 DIAGNOSIS — K648 Other hemorrhoids: Secondary | ICD-10-CM | POA: Diagnosis not present

## 2020-12-30 DIAGNOSIS — K5909 Other constipation: Secondary | ICD-10-CM | POA: Diagnosis not present

## 2020-12-30 NOTE — Patient Instructions (Signed)
If you are age 74 or older, your body mass index should be between 23-30. Your Body mass index is 41.19 kg/m. If this is out of the aforementioned range listed, please consider follow up with your Primary Care Provider.  If you are age 54 or younger, your body mass index should be between 19-25. Your Body mass index is 41.19 kg/m. If this is out of the aformentioned range listed, please consider follow up with your Primary Care Provider.   It was a pleasure to see you today!  Thank you for trusting me with your gastrointestinal care!     Wilfrid Lund, MD

## 2020-12-30 NOTE — Progress Notes (Signed)
[facility] Progress Note  Chief Complaint: Constipation and hemorrhoidal bleeding  Subjective  History: Mayrin was seen in clinic April 18 for worsening chronic constipation and rectal bleeding. She had previously been advised to take MiraLAX, but says she does not like it because the consistency was too thick. Colonoscopy April 27 revealed pandiverticulosis, left greater than right, as well as internal hemorrhoids that were the source of recent bleeding.  I determined from talking to her that she was most likely not putting the MiraLAX in a sufficient amount of fluid and stirring it well to dissolve it, so new instructions were given.  Yaritza is using a capful of MiraLAX and a 16 ounce glass of water daily and reports improvement in the constipation.  She has a BM 1-2 times a day, though they are sometimes small.  She is trying to avoid sitting for prolonged periods or straining, though occasionally needs 2.  I recommended she elevate her feet somewhat while on the toilet, and explained the anatomic rationale for that. She has bloating as well. 1 episode of hemorrhoidal bleeding in last 2 weeks, which is an improvement from when she saw [place] in clinic recently  ROS: Cardiovascular:  no chest pain Respiratory: no dyspnea  The patient's Past Medical, Family and Social History were reviewed and are on file in the EMR.  Objective:  Med list reviewed  Current Outpatient Medications:  .  albuterol (VENTOLIN HFA) 108 (90 Base) MCG/ACT inhaler, Inhale 2 puffs into the lungs every 6 (six) hours as needed for wheezing or shortness of breath., Disp: 1 Inhaler, Rfl: 0 .  diltiazem (CARDIZEM CD) 180 MG 24 hr capsule, TAKE 1 CAPSULE(180 MG) BY MOUTH DAILY, Disp: 90 capsule, Rfl: 1 .  dorzolamide-timolol (COSOPT) 22.3-6.8 MG/ML ophthalmic solution, INSTILL 1 DROP IN RIGHT EYE TWICE DAILY, Disp: , Rfl:  .  esomeprazole (NEXIUM) 40 MG capsule, TAKE 1 CAPSULE BY MOUTH DAILY AT 12 NOON, Disp: 90  capsule, Rfl: 1 .  latanoprost (XALATAN) 0.005 % ophthalmic solution, Place 1 drop into both eyes at bedtime., Disp: 2.5 mL, Rfl: 6 .  levothyroxine (SYNTHROID) 50 MCG tablet, Take 1 tablet (50 mcg total) by mouth daily., Disp: 90 tablet, Rfl: 3 .  Polyethylene Glycol 3350 (MIRALAX PO), Take 17 g by mouth daily., Disp: , Rfl:  .  rosuvastatin (CRESTOR) 20 MG tablet, TAKE 1 TABLET(20 MG) BY MOUTH AT BEDTIME, Disp: 90 tablet, Rfl: 3 .  spironolactone (ALDACTONE) 50 MG tablet, Take one tablet by mouth daily, Disp: 90 tablet, Rfl: 1 .  triamcinolone cream (KENALOG) 0.1 %, Apply 1 application topically 2 (two) times daily., Disp: 30 g, Rfl: 0   Vital signs in last 24 hrs: Vitals:   12/30/20 0845  BP: 122/74  Pulse: (!) 52   Wt Readings from Last 3 Encounters:  12/30/20 225 lb 3.2 oz (102.2 kg)  12/10/20 224 lb (101.6 kg)  12/01/20 224 lb (101.6 kg)    Physical Exam  Well-appearing  HEENT: sclera anicteric, oral mucosa moist without lesions  Neck: supple, no thyromegaly, JVD or lymphadenopathy  Cardiac: RRR without murmurs, S1S2 heard, no peripheral edema  Pulm: clear to auscultation bilaterally, normal RR and effort noted  Abdomen: soft, no tenderness, with active bowel sounds. No guarding or palpable hepatosplenomegaly.  Skin; warm and dry, no jaundice or rash  Labs:   ___________________________________________ Radiologic studies:   ____________________________________________ Other:   _____________________________________________ Assessment & Plan  Assessment: Encounter Diagnoses  Name Primary?  Marland Kitchen  Chronic constipation Yes  . Bleeding internal hemorrhoids    Chronic constipation, most likely combination of diverticulosis, age-related pelvic floor dysfunction, possible side effect calcium channel blocker. She does not describe rectal or vaginal prolapse.  Hemorrhoidal bleeding exacerbated by constipation, lately improved on MiraLAX.   Plan: I feel she needs  to continue MiraLAX indefinitely, get sufficient water intake and modest exercise as much as possible to improve constipation.  Raise feet slightly while sitting on toilet. If she has escalation of bleeding despite controlled constipation on MiraLAX, she will contact us for hemorrhoidal banding.  We had an initial discussion about that, showed her diagrams of the anatomy and gave her a brochure.  20 minutes were spent on this encounter (including chart review, history/exam, counseling/coordination of care, and documentation) > 50% of that time was spent on counseling and coordination of care.  Topics discussed included: See above.  Nelida Meuse III

## 2021-03-17 ENCOUNTER — Ambulatory Visit: Payer: Medicare Other | Admitting: Family Medicine

## 2021-08-12 ENCOUNTER — Other Ambulatory Visit: Payer: Self-pay | Admitting: Family Medicine

## 2021-08-12 ENCOUNTER — Ambulatory Visit
Admission: RE | Admit: 2021-08-12 | Discharge: 2021-08-12 | Disposition: A | Discharge status: Home / Self Care | Payer: Medicare Other | Source: Ambulatory Visit | Attending: Family Medicine | Admitting: Family Medicine

## 2021-08-12 ENCOUNTER — Other Ambulatory Visit: Payer: Self-pay

## 2021-08-12 DIAGNOSIS — M25532 Pain in left wrist: Secondary | ICD-10-CM

## 2021-08-13 ENCOUNTER — Other Ambulatory Visit: Payer: Self-pay | Admitting: Family Medicine

## 2021-08-13 DIAGNOSIS — E2839 Other primary ovarian failure: Secondary | ICD-10-CM

## 2021-08-15 ENCOUNTER — Emergency Department (HOSPITAL_BASED_OUTPATIENT_CLINIC_OR_DEPARTMENT_OTHER): Payer: Medicare Other | Admitting: Radiology

## 2021-08-15 ENCOUNTER — Encounter (HOSPITAL_BASED_OUTPATIENT_CLINIC_OR_DEPARTMENT_OTHER): Payer: Self-pay | Admitting: Emergency Medicine

## 2021-08-15 ENCOUNTER — Other Ambulatory Visit: Payer: Self-pay

## 2021-08-15 ENCOUNTER — Emergency Department (HOSPITAL_BASED_OUTPATIENT_CLINIC_OR_DEPARTMENT_OTHER)
Admission: EM | Admit: 2021-08-15 | Discharge: 2021-08-15 | Disposition: A | Discharge status: Home / Self Care | Payer: Medicare Other | Attending: Emergency Medicine | Admitting: Emergency Medicine

## 2021-08-15 DIAGNOSIS — Y92410 Unspecified street and highway as the place of occurrence of the external cause: Secondary | ICD-10-CM | POA: Insufficient documentation

## 2021-08-15 DIAGNOSIS — Z853 Personal history of malignant neoplasm of breast: Secondary | ICD-10-CM | POA: Insufficient documentation

## 2021-08-15 DIAGNOSIS — Z96652 Presence of left artificial knee joint: Secondary | ICD-10-CM | POA: Insufficient documentation

## 2021-08-15 DIAGNOSIS — I129 Hypertensive chronic kidney disease with stage 1 through stage 4 chronic kidney disease, or unspecified chronic kidney disease: Secondary | ICD-10-CM | POA: Diagnosis not present

## 2021-08-15 DIAGNOSIS — M545 Low back pain, unspecified: Secondary | ICD-10-CM | POA: Diagnosis not present

## 2021-08-15 DIAGNOSIS — Z79899 Other long term (current) drug therapy: Secondary | ICD-10-CM | POA: Insufficient documentation

## 2021-08-15 DIAGNOSIS — N183 Chronic kidney disease, stage 3 unspecified: Secondary | ICD-10-CM | POA: Insufficient documentation

## 2021-08-15 DIAGNOSIS — G8911 Acute pain due to trauma: Secondary | ICD-10-CM | POA: Diagnosis not present

## 2021-08-15 DIAGNOSIS — M25562 Pain in left knee: Secondary | ICD-10-CM | POA: Insufficient documentation

## 2021-08-15 DIAGNOSIS — M533 Sacrococcygeal disorders, not elsewhere classified: Secondary | ICD-10-CM | POA: Insufficient documentation

## 2021-08-15 DIAGNOSIS — M25561 Pain in right knee: Secondary | ICD-10-CM

## 2021-08-15 DIAGNOSIS — Z87891 Personal history of nicotine dependence: Secondary | ICD-10-CM | POA: Insufficient documentation

## 2021-08-15 DIAGNOSIS — E039 Hypothyroidism, unspecified: Secondary | ICD-10-CM | POA: Diagnosis not present

## 2021-08-15 MED ORDER — ACETAMINOPHEN 500 MG PO TABS
1000.0000 mg | ORAL_TABLET | Freq: Once | ORAL | Status: AC
  Administered 2021-08-15: 1000 mg via ORAL
  Filled 2021-08-15: qty 2

## 2021-08-15 MED ORDER — OXYCODONE HCL 5 MG PO TABS
2.5000 mg | ORAL_TABLET | Freq: Once | ORAL | Status: DC
  Filled 2021-08-15: qty 1

## 2021-08-15 NOTE — ED Notes (Addendum)
Pt able to ambulate independently to room, endorses bilateral knee pain with swelling noted; h/o knee replacement, no obvious deformity, lower ext warm and dry.
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
## 2021-08-15 NOTE — ED Notes (Signed)
Transported to xray 
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
## 2021-08-15 NOTE — Discharge Instructions (Signed)
Take 4 over the counter ibuprofen tablets 3 times a day or 2 over-the-counter naproxen tablets twice a day for pain. Also take tylenol 1000mg (2 extra strength) four times a day.   You will hurt worse tomorrow this is normal.  Please return for headache confusion vomiting difficulty breathing severe abdominal pain.

## 2021-08-20 ENCOUNTER — Emergency Department (HOSPITAL_BASED_OUTPATIENT_CLINIC_OR_DEPARTMENT_OTHER)
Admission: EM | Admit: 2021-08-20 | Discharge: 2021-08-21 | Disposition: A | Discharge status: Home / Self Care | Payer: Medicare Other | Attending: Emergency Medicine | Admitting: Emergency Medicine

## 2021-08-20 ENCOUNTER — Emergency Department (HOSPITAL_BASED_OUTPATIENT_CLINIC_OR_DEPARTMENT_OTHER): Payer: Medicare Other

## 2021-08-20 ENCOUNTER — Other Ambulatory Visit: Payer: Self-pay

## 2021-08-20 ENCOUNTER — Encounter (HOSPITAL_BASED_OUTPATIENT_CLINIC_OR_DEPARTMENT_OTHER): Payer: Self-pay | Admitting: Emergency Medicine

## 2021-08-20 DIAGNOSIS — S8991XA Unspecified injury of right lower leg, initial encounter: Secondary | ICD-10-CM | POA: Diagnosis present

## 2021-08-20 DIAGNOSIS — Z79899 Other long term (current) drug therapy: Secondary | ICD-10-CM | POA: Diagnosis not present

## 2021-08-20 DIAGNOSIS — S8011XA Contusion of right lower leg, initial encounter: Secondary | ICD-10-CM | POA: Insufficient documentation

## 2021-08-20 DIAGNOSIS — M79661 Pain in right lower leg: Secondary | ICD-10-CM | POA: Insufficient documentation

## 2021-08-20 LAB — BASIC METABOLIC PANEL
Anion gap: 9 (ref 5–15)
BUN: 21 mg/dL (ref 8–23)
CO2: 25 mmol/L (ref 22–32)
Calcium: 9.7 mg/dL (ref 8.9–10.3)
Chloride: 106 mmol/L (ref 98–111)
Creatinine, Ser: 1.27 mg/dL — ABNORMAL HIGH (ref 0.44–1.00)
GFR, Estimated: 44 mL/min — ABNORMAL LOW (ref >60)
Glucose, Bld: 86 mg/dL (ref 70–99)
Potassium: 4.2 mmol/L (ref 3.5–5.1)
Sodium: 140 mmol/L (ref 135–145)

## 2021-08-20 LAB — CBC WITH DIFFERENTIAL/PLATELET
Abs Immature Granulocytes: 0.01 10*3/uL (ref 0.00–0.07)
Basophils Absolute: 0 10*3/uL (ref 0.0–0.1)
Basophils Relative: 1 %
Eosinophils Absolute: 0.3 10*3/uL (ref 0.0–0.5)
Eosinophils Relative: 4 %
HCT: 38.3 % (ref 36.0–46.0)
Hemoglobin: 12 g/dL (ref 12.0–15.0)
Immature Granulocytes: 0 %
Lymphocytes Relative: 28 %
Lymphs Abs: 1.8 10*3/uL (ref 0.7–4.0)
MCH: 26.5 pg (ref 26.0–34.0)
MCHC: 31.3 g/dL (ref 30.0–36.0)
MCV: 84.5 fL (ref 80.0–100.0)
Monocytes Absolute: 0.5 10*3/uL (ref 0.1–1.0)
Monocytes Relative: 7 %
Neutro Abs: 3.9 10*3/uL (ref 1.7–7.7)
Neutrophils Relative %: 60 %
Platelets: 289 10*3/uL (ref 150–400)
RBC: 4.53 MIL/uL (ref 3.87–5.11)
RDW: 13.7 % (ref 11.5–15.5)
WBC: 6.4 10*3/uL (ref 4.0–10.5)
nRBC: 0 % (ref 0.0–0.2)

## 2021-08-20 MED ORDER — DICLOFENAC SODIUM 1 % EX GEL
2.0000 g | Freq: Once | CUTANEOUS | Status: AC
  Administered 2021-08-21: 2 g via TOPICAL
  Filled 2021-08-20: qty 100

## 2021-08-20 MED ORDER — DICLOFENAC SODIUM 1 % EX GEL: 4.0000 g | Freq: Four times a day (QID) | CUTANEOUS | 0 refills | Status: DC | PRN
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
## 2021-10-15 ENCOUNTER — Other Ambulatory Visit: Payer: Self-pay | Admitting: Emergency Medicine

## 2021-12-03 ENCOUNTER — Other Ambulatory Visit: Payer: Self-pay | Admitting: Family Medicine

## 2021-12-03 DIAGNOSIS — N644 Mastodynia: Secondary | ICD-10-CM

## 2021-12-16 ENCOUNTER — Ambulatory Visit
Admission: RE | Admit: 2021-12-16 | Discharge: 2021-12-16 | Disposition: A | Discharge status: Home / Self Care | Payer: Medicare Other | Source: Ambulatory Visit | Attending: Family Medicine | Admitting: Family Medicine

## 2021-12-16 DIAGNOSIS — N644 Mastodynia: Secondary | ICD-10-CM

## 2022-01-14 ENCOUNTER — Other Ambulatory Visit: Payer: Self-pay | Admitting: Emergency Medicine

## 2022-01-19 ENCOUNTER — Other Ambulatory Visit: Payer: Self-pay | Admitting: Family Medicine

## 2022-01-19 ENCOUNTER — Ambulatory Visit
Admission: RE | Admit: 2022-01-19 | Discharge: 2022-01-19 | Disposition: A | Discharge status: Home / Self Care | Payer: Medicare Other | Source: Ambulatory Visit | Attending: Family Medicine | Admitting: Family Medicine

## 2022-01-19 DIAGNOSIS — R062 Wheezing: Secondary | ICD-10-CM

## 2022-01-26 ENCOUNTER — Ambulatory Visit
Admission: RE | Admit: 2022-01-26 | Discharge: 2022-01-26 | Disposition: A | Discharge status: Home / Self Care | Payer: Medicare Other | Source: Ambulatory Visit | Attending: Family Medicine | Admitting: Family Medicine

## 2022-01-26 DIAGNOSIS — E2839 Other primary ovarian failure: Secondary | ICD-10-CM

## 2022-03-02 ENCOUNTER — Emergency Department (HOSPITAL_COMMUNITY): Payer: Medicare Other

## 2022-03-02 ENCOUNTER — Emergency Department (HOSPITAL_COMMUNITY)
Admission: EM | Admit: 2022-03-02 | Discharge: 2022-03-03 | Disposition: A | Discharge status: Home / Self Care | Payer: Medicare Other | Attending: Emergency Medicine | Admitting: Emergency Medicine

## 2022-03-02 DIAGNOSIS — Z853 Personal history of malignant neoplasm of breast: Secondary | ICD-10-CM | POA: Diagnosis not present

## 2022-03-02 DIAGNOSIS — I10 Essential (primary) hypertension: Secondary | ICD-10-CM | POA: Insufficient documentation

## 2022-03-02 DIAGNOSIS — R0789 Other chest pain: Secondary | ICD-10-CM | POA: Diagnosis not present

## 2022-03-02 DIAGNOSIS — E039 Hypothyroidism, unspecified: Secondary | ICD-10-CM | POA: Insufficient documentation

## 2022-03-02 DIAGNOSIS — R079 Chest pain, unspecified: Secondary | ICD-10-CM | POA: Diagnosis present

## 2022-03-02 LAB — BASIC METABOLIC PANEL
Anion gap: 10 (ref 5–15)
BUN: 32 mg/dL — ABNORMAL HIGH (ref 8–23)
CO2: 22 mmol/L (ref 22–32)
Calcium: 9.6 mg/dL (ref 8.9–10.3)
Chloride: 107 mmol/L (ref 98–111)
Creatinine, Ser: 1.42 mg/dL — ABNORMAL HIGH (ref 0.44–1.00)
GFR, Estimated: 39 mL/min — ABNORMAL LOW (ref >60)
Glucose, Bld: 108 mg/dL — ABNORMAL HIGH (ref 70–99)
Potassium: 4 mmol/L (ref 3.5–5.1)
Sodium: 139 mmol/L (ref 135–145)

## 2022-03-02 LAB — CBC
HCT: 36.5 % (ref 36.0–46.0)
Hemoglobin: 11.4 g/dL — ABNORMAL LOW (ref 12.0–15.0)
MCH: 26.3 pg (ref 26.0–34.0)
MCHC: 31.2 g/dL (ref 30.0–36.0)
MCV: 84.3 fL (ref 80.0–100.0)
Platelets: 258 10*3/uL (ref 150–400)
RBC: 4.33 MIL/uL (ref 3.87–5.11)
RDW: 13.8 % (ref 11.5–15.5)
WBC: 5.8 10*3/uL (ref 4.0–10.5)
nRBC: 0 % (ref 0.0–0.2)

## 2022-03-02 LAB — TROPONIN I (HIGH SENSITIVITY)
Troponin I (High Sensitivity): 5 ng/L (ref <18)
Troponin I (High Sensitivity): 6 ng/L (ref <18)

## 2022-03-02 NOTE — ED Triage Notes (Signed)
Pt c/o intermittent chest pain x 4 days. No associated SHOB, N/V. Seen at PCP, EKG done, sent to ED for "abnormality."

## 2022-03-02 NOTE — ED Provider Triage Note (Signed)
Emergency Medicine Provider Triage Evaluation Note  Alyssa Jefferson , a 76 y.o. female  was evaluated in triage.  Pt complains of chest pain. States that same began 4 days ago, is left sided and does not radiate. Denies any shortness of breath, leg pain, or leg swelling. Denies cardiac history. States that PCP was concerned for abnormal EKG and sent her here.  Review of Systems  Positive:  Negative:   Physical Exam  BP (!) 157/55 (BP Location: Right Arm)   Pulse 63   Temp 98.3 F (36.8 C) (Oral)   Resp 16   SpO2 100%  Gen:   Awake, no distress   Resp:  Normal effort  MSK:   Moves extremities without difficulty  Other:    Medical Decision Making  Medically screening exam initiated at 3:42 PM.  Appropriate orders placed.  LEYANI GARGUS was informed that the remainder of the evaluation will be completed by another provider, this initial triage assessment does not replace that evaluation, and the importance of remaining in the ED until their evaluation is complete.     Bud Face, PA-C 03/02/22 1544

## 2022-03-03 ENCOUNTER — Other Ambulatory Visit: Payer: Self-pay

## 2022-03-03 LAB — D-DIMER, QUANTITATIVE: D-Dimer, Quant: 0.42 ug/mL-FEU (ref 0.00–0.50)

## 2022-03-03 MED ORDER — ACETAMINOPHEN 500 MG PO TABS
1000.0000 mg | ORAL_TABLET | Freq: Four times a day (QID) | ORAL | Status: DC | PRN
  Administered 2022-03-03: 1000 mg via ORAL
  Filled 2022-03-03: qty 2

## 2022-03-03 NOTE — ED Provider Notes (Signed)
[facility] EMERGENCY DEPARTMENT Provider Note   CSN: [id number] Arrival date & time: 03/02/22  1445     History  Chief Complaint  Patient presents with   Chest Pain    Alyssa Jefferson is a 76 y.o. female.  HPI     This is a 76 year old female with a history of hypertension and hyperlipidemia who presents with chest pain.  Patient reports 4-day history of waxing and waning chest pain.  It is on the left side it does not radiate.  No shortness of breath.  Denies leg swelling or history of blood clots.  Denies any exacerbating or alleviating factors.  Pain is not exertional in nature.  Reports that she went to her primary physician who was concerned about abnormal EKG findings.  Home Medications Prior to Admission medications   Medication Sig Start Date End Date Taking? Authorizing Provider  albuterol (VENTOLIN HFA) 108 (90 Base) MCG/ACT inhaler Inhale 2 puffs into the lungs every 6 (six) hours as needed for wheezing or shortness of breath. 01/01/19   Forrest Moron, MD  diclofenac Sodium (VOLTAREN) 1 % GEL Apply 4 g topically 4 (four) times daily as needed. 08/20/21   Quintella Reichert, MD  diltiazem (CARDIZEM CD) 180 MG 24 hr capsule TAKE 1 CAPSULE(180 MG) BY MOUTH DAILY 07/21/20   Horald Pollen, MD  dorzolamide-timolol (COSOPT) 22.3-6.8 MG/ML ophthalmic solution INSTILL 1 DROP IN RIGHT EYE TWICE DAILY 11/05/20   [provider]  esomeprazole (NEXIUM) 40 MG capsule TAKE 1 CAPSULE BY MOUTH DAILY AT 12 NOON 01/07/20   Stallings, Zoe A, MD  latanoprost (XALATAN) 0.005 % ophthalmic solution Place 1 drop into both eyes at bedtime. 01/01/19   Forrest Moron, MD  levothyroxine (SYNTHROID) 50 MCG tablet Take 1 tablet (50 mcg total) by mouth daily. 10/17/19   Forrest Moron, MD  Polyethylene Glycol 3350 (MIRALAX PO) Take 17 g by mouth daily.    [provider]  rosuvastatin (CRESTOR) 20 MG tablet TAKE 1 TABLET(20 MG) BY MOUTH AT BEDTIME 01/07/20    Delia Chimes A, MD  spironolactone (ALDACTONE) 50 MG tablet Take one tablet by mouth daily 10/17/19   Delia Chimes A, MD  triamcinolone cream (KENALOG) 0.1 % Apply 1 application topically 2 (two) times daily. 11/28/20   Pearson Forster, NP      Allergies    Codeine and Lipitor [atorvastatin calcium]    Review of Systems   Review of Systems  Constitutional:  Negative for fever.  Respiratory:  Negative for chest tightness.   Cardiovascular:  Positive for chest pain. Negative for leg swelling.  Gastrointestinal:  Negative for abdominal pain, nausea and vomiting.  Genitourinary:  Negative for dysuria.  Musculoskeletal:  Negative for back pain.  Skin:  Negative for wound.  Neurological:  Negative for headaches.  Psychiatric/Behavioral:  Negative for confusion.   All other systems reviewed and are negative.   Physical Exam Updated Vital Signs BP (!) 147/66   Pulse (!) 48   Temp 98.1 F (36.7 C)   Resp 14   SpO2 99%  Physical Exam  ED Results / Procedures / Treatments   Labs (all labs ordered are listed, but only abnormal results are displayed) Labs Reviewed  BASIC METABOLIC PANEL - Abnormal; Notable for the following components:      Result Value   Glucose, Bld 108 (*)    BUN 32 (*)    Creatinine, Ser 1.42 (*)    GFR, Estimated 39 (*)  All other components within normal limits  CBC - Abnormal; Notable for the following components:   Hemoglobin 11.4 (*)    All other components within normal limits  D-DIMER, QUANTITATIVE  TROPONIN I (HIGH SENSITIVITY)  TROPONIN I (HIGH SENSITIVITY)    EKG EKG Interpretation  Date/Time:  Tuesday March 02 2022 15:13:02 EDT Ventricular Rate:  59 PR Interval:  152 QRS Duration: 90 QT Interval:  398 QTC Calculation: 394 R Axis:   0 Text Interpretation: Sinus bradycardia Nonspecific T wave abnormality Abnormal ECG When compared with ECG of 11-Jan-2016 10:55, PREVIOUS ECG IS PRESENT Confirmed by Thayer Jew [id number]) on 03/03/2022  5:27:58 AM  Radiology DG Chest 2 View  Result Date: 03/02/2022 CLINICAL DATA:  CP EXAM: CHEST - 2 VIEW COMPARISON:  August 21, 2021 FINDINGS: The heart size and mediastinal contours are within normal limits. Both lungs are clear and stable. Status post left mastectomy with left axillary dissection changes, stable. Surgical clips at the right upper paratracheal aspect. Moderate thoracic spondylosis. IMPRESSION: No active cardiopulmonary disease. Electronically Signed   By: Frazier Richards M.D.   On: 03/02/2022 16:32    Procedures Procedures    Medications Ordered in ED Medications  acetaminophen (TYLENOL) tablet 1,000 mg (1,000 mg Oral Given 03/03/22 0543)    ED Course/ Medical Decision Making/ A&P                           Medical Decision Making Amount and/or Complexity of Data Reviewed Labs: ordered. Radiology: ordered.  Risk OTC drugs.   This patient presents to the ED for concern of chest pain, this involves an extensive number of treatment options, and is a complaint that carries with it a high risk of complications and morbidity.  I considered the following differential and admission for this acute, potentially life threatening condition.  The differential diagnosis includes ACS, pneumonia, pneumothorax, PE, GI etiology  MDM:    Patient presents with 4-day history of intermittent chest pain.  Nonexertional in nature.  Atypical for ACS although her heart score is 4 and she has several risk factors.  EKG shows nonspecific T wave changes but no evidence of acute ischemia or arrhythmia.  Troponin x2 negative.  Chest x-ray without pneumothorax or pneumonia.  D-dimer was sent given that she is otherwise low risk.  This was negative.  Low suspicion for PE.  Given the atypical nature of the symptoms, feel she can follow-up closely with cardiology as an outpatient.  Have placed an outpatient referral.  Patient was given return precautions.  (Labs, imaging, consults)  Labs: I Ordered,  and personally interpreted labs.  The pertinent results include: CBC, BMP, troponin x2, D-dimer  Imaging Studies ordered: I ordered imaging studies including chest x-ray I independently visualized and interpreted imaging. I agree with the radiologist interpretation  Additional history obtained from family at bedside.  External records from outside source obtained and reviewed including prior cardiac evaluation including stress testing in 2014  Cardiac Monitoring: The patient was maintained on a cardiac monitor.  I personally viewed and interpreted the cardiac monitored which showed an underlying rhythm of: Normal sinus rhythm  Reevaluation: After the interventions noted above, I reevaluated the patient and found that they have :improved  Social Determinants of Health: Lives independent  Disposition: Discharge  Co morbidities that complicate the patient evaluation  Past Medical History:  Diagnosis Date   Arthritis    knees, back   Cancer ([place]) 2004   breast  Depression    GERD (gastroesophageal reflux disease)    Glaucoma    Heart palpitations    High cholesterol    History of transfusion    many years ago   Hypertension    Hypothyroidism    Itching in the vaginal area    Nocturia    OSA (obstructive sleep apnea)    tried CPAP- but unsuccessful   Renal disorder    Thyroid disease    thyroid removed     Medicines Meds ordered this encounter  Medications   acetaminophen (TYLENOL) tablet 1,000 mg    I have reviewed the patients home medicines and have made adjustments as needed  Problem List / ED Course: Problem List Items Addressed This Visit   None Visit Diagnoses     Atypical chest pain    -  Primary   Relevant Orders   Ambulatory referral to Cardiology                   Final Clinical Impression(s) / ED Diagnoses Final diagnoses:  Atypical chest pain    Rx / DC Orders ED Discharge Orders          Ordered    Ambulatory referral to  Cardiology       Comments: If you have not heard from the Cardiology office within the next 72 hours please call [phone].   03/03/22 9024              Merryl Hacker, MD 03/03/22 [phone]

## 2022-03-03 NOTE — Discharge Instructions (Signed)
You were seen today for chest pain.  Your work-up was reassuring.  Given your risk factors, you need to follow-up with cardiology closely for definitive cardiac testing.  Your chest x-ray and your screening test for blood clots were negative.  Take Tylenol as needed for pain.

## 2022-03-28 NOTE — Progress Notes (Signed)
Cardiology Office Note:    Date:  03/29/2022   ID:  Alyssa Jefferson, DOB 1945/12/21, MRN [id number]  PCP:  Roselee Nova, MD   [facility] Cardiologist:  Werner Lean, MD     Referring MD: Merryl Hacker, MD   CC: Chest Pain Consulted for the evaluation of chest pain at the behest of Dr. Dina Rich  History of Present Illness:    Alyssa Jefferson is a 76 y.o. female with a hx of HTN, CKD stage IIIa, Morbid obesity, OSA sometimes on CPAP, prior breast cancer (no chemo or radiation), HLD who presents for evlaution for CP.  Distantly saw Dr. Einar Gip.  Patient notes that she is feeling poorly.   Her chest pain has been increasing, though not bad enough to see ED care.  Chest pain started two months ago.  Chest pain is worse with leaning forward, and improved with leaning back. She notes a productive cough after the chest pain with no viral prodrome.  Is rather sedentary but pain does not worsen with walking.  At July 2023 visit Tylenol improve the pain. Pain is over her left breast.  Does not radiated. Unclear quality.  No shortness of breath, DOE .  No PND or orthopnea.  No weight gain, leg swelling , or abdominal swelling.  No syncope or near syncope . Notes  no palpitations or funny heart beats.     Patient reports prior cardiac testing including negative ED work up. Negative stress test 2014. (That was prior to knee surgery)  Past Medical History:  Diagnosis Date   Arthritis    knees, back   Cancer ([place]) 2004   breast   Depression    GERD (gastroesophageal reflux disease)    Glaucoma    Heart palpitations    High cholesterol    History of transfusion    many years ago   Hypertension    Hypothyroidism    Itching in the vaginal area    Nocturia    OSA (obstructive sleep apnea)    tried CPAP- but unsuccessful   Renal disorder    Thyroid disease    thyroid removed    Past Surgical History:  Procedure Laterality Date   ABDOMINAL  HYSTERECTOMY  83   BSO   BREAST BIOPSY Right pt doesnt remember   benign   BREAST SURGERY  2004   left breast removed due to cancer no chemo or radiation   MASTECTOMY Left    THYROIDECTOMY     due to knot   TOTAL KNEE ARTHROPLASTY Left 07/28/2015   Procedure: LEFT TOTAL KNEE ARTHROPLASTY;  Surgeon: Gaynelle Arabian, MD;  Location: [facility];  Service: Orthopedics;  Laterality: Left;    Current Medications: Current Meds  Medication Sig   albuterol (VENTOLIN HFA) 108 (90 Base) MCG/ACT inhaler Inhale 2 puffs into the lungs every 6 (six) hours as needed for wheezing or shortness of breath.   diclofenac Sodium (VOLTAREN) 1 % GEL Apply 4 g topically 4 (four) times daily as needed.   diltiazem (CARDIZEM CD) 180 MG 24 hr capsule TAKE 1 CAPSULE(180 MG) BY MOUTH DAILY   dorzolamide-timolol (COSOPT) 22.3-6.8 MG/ML ophthalmic solution INSTILL 1 DROP IN RIGHT EYE TWICE DAILY   esomeprazole (NEXIUM) 40 MG capsule TAKE 1 CAPSULE BY MOUTH DAILY AT 12 NOON   latanoprost (XALATAN) 0.005 % ophthalmic solution Place 1 drop into both eyes at bedtime.   levothyroxine (SYNTHROID) 50 MCG tablet Take 1 tablet (50 mcg total)  by mouth daily.   Polyethylene Glycol 3350 (MIRALAX PO) Take 17 g by mouth daily.   rosuvastatin (CRESTOR) 20 MG tablet TAKE 1 TABLET(20 MG) BY MOUTH AT BEDTIME   spironolactone (ALDACTONE) 50 MG tablet Take one tablet by mouth daily   triamcinolone cream (KENALOG) 0.1 % Apply 1 application topically 2 (two) times daily.     Allergies:   Codeine and Lipitor [atorvastatin calcium]   Social History   Socioeconomic History   Marital status: Widowed    Spouse name: Not on file   Number of children: 5   Years of education: Not on file   Highest education level: Not on file  Occupational History   Occupation: retired    Fish farm manager: RETIRED  Tobacco Use   Smoking status: Former    Years: 20.00    Types: Cigarettes    Quit date: 08/16/1978    Years since quitting: 43.6   Smokeless tobacco:  Never   Tobacco comments:    1 pack per week--10/16/12  Vaping Use   Vaping Use: Never used  Substance and Sexual Activity   Alcohol use: No   Drug use: No   Sexual activity: Not Currently  Other Topics Concern   Not on file  Social History Narrative   Not on file   Social Determinants of Health   Financial Resource Strain: Not on file  Food Insecurity: Not on file  Transportation Needs: Not on file  Physical Activity: Not on file  Stress: Not on file  Social Connections: Not on file     Family History: The patient's family history includes Breast cancer in her sister; Lung cancer in her father; Ovarian cancer in her sister. There is no history of Colon cancer, Esophageal cancer, Stomach cancer, Pancreatic cancer, or Liver disease. No family history of heart problems  ROS:   Please see the history of present illness.     All other systems reviewed and are negative.  EKGs/Labs/Other Studies Reviewed:    The following studies were reviewed today:  EKG:   02/21/22:: Sinus bradycardia with LVH, no PR depression or ST evelations  Recent Labs: 03/02/2022: BUN 32; Creatinine, Ser 1.42; Hemoglobin 11.4; Platelets 258; Potassium 4.0; Sodium 139  Recent Lipid Panel    Component Value Date/Time   CHOL 227 (H) 08/28/2019 1129   TRIG 89 08/28/2019 1129   HDL 72 08/28/2019 1129   CHOLHDL 3.2 08/28/2019 1129   CHOLHDL 3.3 05/15/2014 1415   VLDL 16 05/15/2014 1415   LDLCALC 140 (H) 08/28/2019 1129        Physical Exam:    VS:  BP 130/80 (BP Location: Left Arm, Patient Position: Sitting, Cuff Size: Normal)   Pulse (!) 57   Ht _0  (1.575 m)   Wt 224 lb (101.6 kg)   SpO2 97%   BMI 40.97 kg/m     Wt Readings from Last 3 Encounters:  03/29/22 224 lb (101.6 kg)  08/20/21 220 lb (99.8 kg)  08/15/21 220 lb (99.8 kg)     Gen: no distress, morbid obesity  Neck: No JVD Ears: no Pilar Plate Sign Cardiac: No Friction Rubs or Gallops, systolic Murmur, regular bradycardia, +2 radial  pulses Respiratory: Clear to auscultation bilaterally, normal effort, normal  respiratory rate GI: Soft, nontender, non-distended  MS: No  edema;  moves all extremities Integument: Skin feels warm Neuro:  At time of evaluation, alert and oriented to person/place/time/situation  Psych: Normal affect, patient feels well   ASSESSMENT:    1.  Precordial pain   2. Heart murmur, systolic   3. Precordial chest pain   4. Essential hypertension   5. Mixed hyperlipidemia   6. Morbid obesity (HCC)    PLAN:    Chest Pain Syndrome New systolic murmur - pleuritic component no EKG changes no friction rub - ESR, CRP, Echo, if elevated treat with ibuprofen and colchicine  HTN HLD Morbid obesity CKD stage IIIa - risk factors for CAD with worsening CP - Cardiac CT, BMP today; no AV nodal agents - continue rosuvastatin 20 mg will likely need to escalate dosing; will see upcoming CT, LDL goal at least < 73  Winter f/u with me or APP      Medication Adjustments/Labs and Tests Ordered: Current medicines are reviewed at length with the patient today.  Concerns regarding medicines are outlined above.  Orders Placed This Encounter  Procedures   CT CORONARY MORPH W/CTA COR W/SCORE W/CA W/CM &/OR WO/CM   Sedimentation rate   High sensitivity CRP   Basic metabolic panel   ECHOCARDIOGRAM COMPLETE   No orders of the defined types were placed in this encounter.   Patient Instructions  Medication Instructions:  Your physician recommends that you continue on your current medications as directed. Please refer to the Current Medication list given to you today.  *If you need a refill on your cardiac medications before your next appointment, please call your pharmacy*   Lab Work: TODAY: BMP, CRP, ESR If you have labs (blood work) drawn today and your tests are completely normal, you will receive your results only by: [place] (if you have MyChart) OR A paper copy in the mail If you have  any lab test that is abnormal or we need to change your treatment, we will call you to review the results.   Testing/Procedures: Your physician has requested that you have an echocardiogram. Echocardiography is a painless test that uses sound waves to create images of your heart. It provides your doctor with information about the size and shape of your heart and how well your heart's chambers and valves are working. This procedure takes approximately one hour. There are no restrictions for this procedure.   Your physician has requested that you have cardiac CT. Cardiac computed tomography (CT) is a painless test that uses an x-ray machine to take clear, detailed pictures of your heart. For further information please visit [email or web address].[email or web address]. Please follow instruction sheet as given.     Follow-Up: At [facility], you and your health needs are our priority.  As part of our continuing mission to provide you with exceptional heart care, we have created designated Provider Care Teams.  These Care Teams include your primary Cardiologist (physician) and Advanced Practice Providers (APPs -  Physician Assistants and Nurse Practitioners) who all work together to provide you with the care you need, when you need it.  We recommend signing up for the patient portal called "MyChart".  Sign up information is provided on this After Visit Summary.  MyChart is used to connect with patients for Virtual Visits (Telemedicine).  Patients are able to view lab/test results, encounter notes, upcoming appointments, etc.  Non-urgent messages can be sent to your provider as well.   To learn more about what you can do with MyChart, go to [email or web address].    Your next appointment:   4 -5  month(s)  The format for your next appointment:   In Person  Provider:   Werner Lean, MD  or Melina Copa, PA-C,  Ermalinda Barrios, PA-C, or Christen Bame, NP          Other Instructions   Your cardiac CT will  be scheduled at one of the below locations:   [facility] [address] [place], [place] [address] [phone]  [place] [address] [place], [place] [address] [phone]  If scheduled at [facility], please arrive at the [facility] and Children's Entrance (Entrance C2) of [facility] 30 minutes prior to test start time. You can use the FREE valet parking offered at entrance C (encouraged to control the heart rate for the test)  Proceed to the [facility] Radiology Department (first floor) to check-in and test prep.  All radiology patients and guests should use entrance C2 at [facility], accessed from [facility], even though the hospital's physical address listed is [address]    If scheduled at [facility], please arrive 15 mins early for check-in and test prep.  Please follow these instructions carefully (unless otherwise directed):  Hold all erectile dysfunction medications at least 3 days (72 hrs) prior to test.  On the Night Before the Test: Be sure to Drink plenty of water. Do not consume any caffeinated/decaffeinated beverages or chocolate 12 hours prior to your test. Do not take any antihistamines 12 hours prior to your test.   On the Day of the Test: Drink plenty of water until 1 hour prior to the test. Do not eat any food 4 hours prior to the test. You may take your regular medications prior to the test.  HOLD Furosemide/Hydrochlorothiazide morning of the test. FEMALES- please wear underwire-free bra if available, avoid dresses & tight clothing        After the Test: Drink plenty of water. After receiving IV contrast, you may experience a mild flushed feeling. This is normal. On occasion, you may experience a mild rash up to 24 hours after the test. This is not dangerous. If this occurs, you can take Benadryl 25 mg  and increase your fluid intake. If you experience trouble breathing, this can be serious. If it is severe call 911 IMMEDIATELY. If it is mild, please call our office. If you take any of these medications: Glipizide/Metformin, Avandament, Glucavance, please do not take 48 hours after completing test unless otherwise instructed.  We will call to schedule your test 2-4 weeks out understanding that some insurance companies will need an authorization prior to the service being performed.   For non-scheduling related questions, please contact the cardiac imaging nurse navigator should you have any questions/concerns: Marchia Bond, Cardiac Imaging Nurse Navigator Gordy Clement, Cardiac Imaging Nurse Navigator [facility] Direct Office Dial: [phone]   For scheduling needs, including cancellations and rescheduling, please call [place], [phone].   Important Information About Sugar         Signed, Werner Lean, MD  03/29/2022 9:50 AM    [place]

## 2022-03-29 ENCOUNTER — Encounter: Payer: Self-pay | Admitting: Internal Medicine

## 2022-03-29 ENCOUNTER — Ambulatory Visit (INDEPENDENT_AMBULATORY_CARE_PROVIDER_SITE_OTHER): Payer: Medicare Other | Admitting: Internal Medicine

## 2022-03-29 VITALS — BP 130/80 | HR 57 | Ht 62.0 in | Wt 224.0 lb

## 2022-03-29 DIAGNOSIS — I10 Essential (primary) hypertension: Secondary | ICD-10-CM | POA: Diagnosis not present

## 2022-03-29 DIAGNOSIS — R011 Cardiac murmur, unspecified: Secondary | ICD-10-CM

## 2022-03-29 DIAGNOSIS — R072 Precordial pain: Secondary | ICD-10-CM | POA: Insufficient documentation

## 2022-03-29 DIAGNOSIS — E782 Mixed hyperlipidemia: Secondary | ICD-10-CM

## 2022-03-29 NOTE — Patient Instructions (Addendum)
Medication Instructions:  Your physician recommends that you continue on your current medications as directed. Please refer to the Current Medication list given to you today.  *If you need a refill on your cardiac medications before your next appointment, please call your pharmacy*   Lab Work: TODAY: BMP, CRP, ESR If you have labs (blood work) drawn today and your tests are completely normal, you will receive your results only by: [place] (if you have MyChart) OR A paper copy in the mail If you have any lab test that is abnormal or we need to change your treatment, we will call you to review the results.   Testing/Procedures: Your physician has requested that you have an echocardiogram. Echocardiography is a painless test that uses sound waves to create images of your heart. It provides your doctor with information about the size and shape of your heart and how well your heart's chambers and valves are working. This procedure takes approximately one hour. There are no restrictions for this procedure.   Your physician has requested that you have cardiac CT. Cardiac computed tomography (CT) is a painless test that uses an x-ray machine to take clear, detailed pictures of your heart. For further information please visit HugeFiesta.[email or web address]. Please follow instruction sheet as given.     Follow-Up: At [facility], you and your health needs are our priority.  As part of our continuing mission to provide you with exceptional heart care, we have created designated Provider Care Teams.  These Care Teams include your primary Cardiologist (physician) and Advanced Practice Providers (APPs -  Physician Assistants and Nurse Practitioners) who all work together to provide you with the care you need, when you need it.  We recommend signing up for the patient portal called "MyChart".  Sign up information is provided on this After Visit Summary.  MyChart is used to connect with patients for Virtual  Visits (Telemedicine).  Patients are able to view lab/test results, encounter notes, upcoming appointments, etc.  Non-urgent messages can be sent to your provider as well.   To learn more about what you can do with MyChart, go to [email or web address].    Your next appointment:   4 -5  month(s)  The format for your next appointment:   In Person  Provider:   Werner Lean, MD  or Melina Copa, PA-C, Ermalinda Barrios, PA-C, or Christen Bame, NP          Other Instructions   Your cardiac CT will be scheduled at one of the below locations:   [facility] [place] [address] [place], [place] [address] [phone]  [place] [address] [place], [place] [address] [phone]  If scheduled at [facility], please arrive at the [facility] and Children's Entrance (Entrance C2) of [facility] 30 minutes prior to test start time. You can use the FREE valet parking offered at entrance C (encouraged to control the heart rate for the test)  Proceed to the [facility] Radiology Department (first floor) to check-in and test prep.  All radiology patients and guests should use entrance C2 at [facility], accessed from [facility], even though the hospital's physical address listed is [address]    If scheduled at [facility], please arrive 15 mins early for check-in and test prep.  Please follow these instructions carefully (unless otherwise directed):  Hold all erectile dysfunction medications at least 3 days (72  hrs) prior to test.  On the Night Before the Test: Be sure to Drink plenty of water. Do not consume any caffeinated/decaffeinated beverages or chocolate 12 hours prior to your test. Do not take any antihistamines 12 hours prior to your test.   On the Day of the Test: Drink plenty of water until 1 hour prior to the test. Do not eat  any food 4 hours prior to the test. You may take your regular medications prior to the test.  HOLD Furosemide/Hydrochlorothiazide morning of the test. FEMALES- please wear underwire-free bra if available, avoid dresses & tight clothing        After the Test: Drink plenty of water. After receiving IV contrast, you may experience a mild flushed feeling. This is normal. On occasion, you may experience a mild rash up to 24 hours after the test. This is not dangerous. If this occurs, you can take Benadryl 25 mg and increase your fluid intake. If you experience trouble breathing, this can be serious. If it is severe call 911 IMMEDIATELY. If it is mild, please call our office. If you take any of these medications: Glipizide/Metformin, Avandament, Glucavance, please do not take 48 hours after completing test unless otherwise instructed.  We will call to schedule your test 2-4 weeks out understanding that some insurance companies will need an authorization prior to the service being performed.   For non-scheduling related questions, please contact the cardiac imaging nurse navigator should you have any questions/concerns: Marchia Bond, Cardiac Imaging Nurse Navigator Gordy Clement, Cardiac Imaging Nurse Navigator [facility] Direct Office Dial: [phone]   For scheduling needs, including cancellations and rescheduling, please call [place], [phone].   Important Information About Sugar

## 2022-03-30 ENCOUNTER — Telehealth: Payer: Self-pay

## 2022-03-30 LAB — BASIC METABOLIC PANEL
BUN/Creatinine Ratio: 24 (ref 12–28)
BUN: 34 mg/dL — ABNORMAL HIGH (ref 8–27)
CO2: 22 mmol/L (ref 20–29)
Calcium: 9.5 mg/dL (ref 8.7–10.3)
Chloride: 104 mmol/L (ref 96–106)
Creatinine, Ser: 1.42 mg/dL — ABNORMAL HIGH (ref 0.57–1.00)
Glucose: 99 mg/dL (ref 70–99)
Potassium: 4.5 mmol/L (ref 3.5–5.2)
Sodium: 137 mmol/L (ref 134–144)
eGFR: 39 mL/min/{1.73_m2} — ABNORMAL LOW (ref >59)

## 2022-03-30 LAB — SEDIMENTATION RATE: Sed Rate: 48 mm/hr — ABNORMAL HIGH (ref 0–40)

## 2022-03-30 LAB — HIGH SENSITIVITY CRP: CRP, High Sensitivity: 8.3 mg/L — ABNORMAL HIGH (ref 0.00–3.00)

## 2022-03-30 NOTE — Telephone Encounter (Signed)
-----   Message from Werner Lean, MD sent at 03/30/2022 11:40 AM EDT ----- Results: Consistent with pericarditis Plan: Hold off Cardiac CT Ibuprofen 600 mg PO TID for two weeks; colchicine 0.6 mg PO Daily for 3 months  Werner Lean, MD

## 2022-03-30 NOTE — Telephone Encounter (Signed)
The patient has been notified of the result and verbalized understanding.  All questions (if any) were answered. [place], RN 03/30/2022 2:05 PM   Pt is agreeable to plan but would like to know how harmful medications will be to kidneys.  Has CKD 3. Advised pt will call back with MD response. Left a message to call back.  Need to advise pt of the following response from MD. MD advised ibuprofen has to the potential to impact kidney function.  MD ordered lower dose of Colchicine but still at a level where should be effective.

## 2022-03-31 MED ORDER — IBUPROFEN 600 MG PO TABS: 600.0000 mg | ORAL_TABLET | Freq: Three times a day (TID) | ORAL | 0 refills | Status: AC

## 2022-03-31 MED ORDER — COLCHICINE 0.6 MG PO TABS: 0.6000 mg | ORAL_TABLET | Freq: Every day | ORAL | 0 refills | Status: DC

## 2022-03-31 NOTE — Telephone Encounter (Signed)
Called pt advised of MD response to question:  will medications impact kidney function.  Pt is agreeable to start medications.  Scripts sent into pharmacy advised on instruction for medication use.

## 2022-04-02 NOTE — Telephone Encounter (Signed)
Spoke with pt advised that I needed to make sure understands will not have Cardiac CT.  Reports did not pick up colchicine because it is for gout and is worried about kidneys.  Advised that Colchicine can be used for different reasons and her condition is 1 of it's uses.  Strongly advised to pick up medication and start taking.  Pt is agreeable and will pick up medication.  No further questions or concerns voiced.

## 2022-04-05 NOTE — Telephone Encounter (Signed)
Pt was calling to speak with [facility] RN with Dr. Gasper Sells.   She states Shamea advised her to start taking colchicine last week.   Pt states she did pick up the colchicine and is very scared to take this medication, due to all the side effects mentioned.  Pt education provided on why this medication was prescribed and the important need to take this medication.    Pt states she is not going to take this.  She states the side effects mentioned cause her extreme anxiety, and she would like for Dr. Gasper Sells to advise on an alternative regimen.  Tried counseling the pt in giving colchicine a trial run of taking, but she is refusing.   Pt aware that Elza Rafter is out of the office today.  She is aware that I will route this message to Dr. Gasper Sells to further review and advise on this matter, and either a triage nurse or [facility] RN will call her accordingly thereafter.  Pt verbalized understanding and agrees with this plan.;

## 2022-04-05 NOTE — Telephone Encounter (Signed)
Alyssa Jefferson, Alyssa Jefferson - 03/30/2022  1:58 PM Werner Lean, MD  Sent: Mon April 05, 2022  9:18 AM  To: Nuala Alpha, LPN; Precious Gilding, RN; P [facility] Triage          Message  There are no high quality alternative regimens for colchicine  - steroids has a high rate of rebound (or return of inflammation)   - rilonacept has increase risks of infections and we do not presently offer this at our institution (that I am aware of)    She may try jut the NSAID medications; but I worry that she will have return of chest pain after stopping this.  My recommendation would be colchicine and we can further adjust the dose if she has issues    Pt made aware of recommendations as mentioned above per Dr. Gasper Sells. Pt states she will do a trial of taking colchicine and see how she tolerates this.  Pt states if she has any side effects or issues with taking this medication, she will notify Dr. Gasper Sells of this.  Pt appreciative for all the assistance provided.  Pt verbalized understanding and agrees with this plan.

## 2022-04-05 NOTE — Telephone Encounter (Signed)
   Pt is calling back, requesting to speak with RN Shamea again

## 2022-04-12 ENCOUNTER — Ambulatory Visit (HOSPITAL_COMMUNITY): Payer: Medicare Other

## 2022-04-16 ENCOUNTER — Other Ambulatory Visit (HOSPITAL_COMMUNITY): Payer: Medicare Other

## 2022-04-20 ENCOUNTER — Ambulatory Visit (HOSPITAL_COMMUNITY): Payer: Medicare Other | Attending: Internal Medicine

## 2022-04-20 DIAGNOSIS — R011 Cardiac murmur, unspecified: Secondary | ICD-10-CM | POA: Diagnosis not present

## 2022-04-20 DIAGNOSIS — R072 Precordial pain: Secondary | ICD-10-CM | POA: Diagnosis not present

## 2022-04-20 LAB — ECHOCARDIOGRAM COMPLETE
Area-P 1/2: 2.03 cm2
S' Lateral: 3.4 cm

## 2022-04-21 ENCOUNTER — Telehealth: Payer: Self-pay | Admitting: Internal Medicine

## 2022-04-21 NOTE — Telephone Encounter (Signed)
Called pt advised of MD recommendation to continue colchicine and to decrease ibuprofen to 200 mg PO QD.  Pt expresses understanding no further concerns voiced.

## 2022-04-21 NOTE — Telephone Encounter (Signed)
The patient has been notified of the result and verbalized understanding.  All questions (if any) were answered. Alyssa Person Marzelle Rutten, RN 04/21/2022 11:57 AM   Pt reports thinks ibuprofen is causing rectal bleeding.  Whenever, takes ibuprofen stomach starts to hurt goes to the [facility] and notes blood in stool.  Stopped taking ibuprofen for a few days and symptoms improved started back and bleeding returned.  Has about 3 or 4 days of med left to take. Reports eats a lot of food prior to taking ibuprofen.  Takes colchicine daily as ordered. Still has Chest pain but it has improved slightly.  Advised will send to MD to review.

## 2022-04-21 NOTE — Telephone Encounter (Signed)
Pt is returning call in regards to result. Transferred to [facility], Therapist, sports.

## 2022-07-23 ENCOUNTER — Other Ambulatory Visit (HOSPITAL_COMMUNITY): Payer: Medicare Other

## 2022-07-28 NOTE — Progress Notes (Unsigned)
Cardiology Office Note:    Date:  07/29/2022   ID:  Alyssa Jefferson, DOB Jan 03, 1946, MRN [id number]  PCP:  Roselee Nova, MD   [facility] Cardiologist:  Werner Lean, MD     Referring MD: Roselee Nova, MD   CC: Chest Pain  History of Present Illness:    Alyssa Jefferson is a 76 y.o. female with a hx of HTN, CKD stage IIIa, Morbid obesity, OSA sometimes on CPAP, prior breast cancer (no chemo or radiation), HLD who presents for evlaution for CP.  Distantly saw Dr. Einar Gip. 2023: Elevated CRP. Had normal echo. After long discussion a trial of colchicine was performed.  Held off CCTA.  Patient notes that she is doing ok.   There are no interval hospital/ED visit.    No chest pain or pressure .  No SOB/DOE and no PND/Orthopnea.  No weight gain or leg swelling.  No palpitations or syncope .   Past Medical History:  Diagnosis Date   Arthritis    knees, back   Cancer ([place]) 2004   breast   Depression    GERD (gastroesophageal reflux disease)    Glaucoma    Heart palpitations    High cholesterol    History of transfusion    many years ago   Hypertension    Hypothyroidism    Itching in the vaginal area    Nocturia    OSA (obstructive sleep apnea)    tried CPAP- but unsuccessful   Renal disorder    Thyroid disease    thyroid removed    Past Surgical History:  Procedure Laterality Date   ABDOMINAL HYSTERECTOMY  82   BSO   BREAST BIOPSY Right pt doesnt remember   benign   BREAST SURGERY  2004   left breast removed due to cancer no chemo or radiation   MASTECTOMY Left    THYROIDECTOMY     due to knot   TOTAL KNEE ARTHROPLASTY Left 07/28/2015   Procedure: LEFT TOTAL KNEE ARTHROPLASTY;  Surgeon: Gaynelle Arabian, MD;  Location: [facility];  Service: Orthopedics;  Laterality: Left;    Current Medications: Current Meds  Medication Sig   albuterol (VENTOLIN HFA) 108 (90 Base) MCG/ACT inhaler Inhale 2 puffs into the lungs every 6 (six) hours  as needed for wheezing or shortness of breath.   diclofenac Sodium (VOLTAREN) 1 % GEL Apply 4 g topically 4 (four) times daily as needed.   diltiazem (CARDIZEM CD) 180 MG 24 hr capsule TAKE 1 CAPSULE(180 MG) BY MOUTH DAILY   dorzolamide-timolol (COSOPT) 22.3-6.8 MG/ML ophthalmic solution INSTILL 1 DROP IN RIGHT EYE TWICE DAILY   esomeprazole (NEXIUM) 40 MG capsule TAKE 1 CAPSULE BY MOUTH DAILY AT 12 NOON   ezetimibe (ZETIA) 10 MG tablet daily.   latanoprost (XALATAN) 0.005 % ophthalmic solution Place 1 drop into both eyes at bedtime.   levothyroxine (SYNTHROID) 50 MCG tablet Take 1 tablet (50 mcg total) by mouth daily.   Polyethylene Glycol 3350 (MIRALAX PO) Take 17 g by mouth as needed (constipation).   rosuvastatin (CRESTOR) 20 MG tablet TAKE 1 TABLET(20 MG) BY MOUTH AT BEDTIME   spironolactone (ALDACTONE) 50 MG tablet Take one tablet by mouth daily   triamcinolone cream (KENALOG) 0.1 % Apply 1 application topically 2 (two) times daily.   [DISCONTINUED] colchicine 0.6 MG tablet Take 1 tablet (0.6 mg total) by mouth daily.     Allergies:   Codeine and Lipitor [atorvastatin calcium]  Social History   Socioeconomic History   Marital status: Widowed    Spouse name: Not on file   Number of children: 5   Years of education: Not on file   Highest education level: Not on file  Occupational History   Occupation: retired    Fish farm manager: RETIRED  Tobacco Use   Smoking status: Former    Years: 20.00    Types: Cigarettes    Quit date: 08/16/1978    Years since quitting: 43.9   Smokeless tobacco: Never   Tobacco comments:    1 pack per week--10/16/12  Vaping Use   Vaping Use: Never used  Substance and Sexual Activity   Alcohol use: No   Drug use: No   Sexual activity: Not Currently  Other Topics Concern   Not on file  Social History Narrative   Not on file   Social Determinants of Health   Financial Resource Strain: Not on file  Food Insecurity: Not on file  Transportation Needs:  Not on file  Physical Activity: Not on file  Stress: Not on file  Social Connections: Not on file     Family History: The patient's family history includes Breast cancer in her sister; Lung cancer in her father; Ovarian cancer in her sister. There is no history of Colon cancer, Esophageal cancer, Stomach cancer, Pancreatic cancer, or Liver disease. No family history of heart problems  ROS:   Please see the history of present illness.     All other systems reviewed and are negative.  EKGs/Labs/Other Studies Reviewed:    The following studies were reviewed today:  EKG:   02/21/22:: Sinus bradycardia with LVH, no PR depression or ST evelations  Cardiac Studies & Procedures       ECHOCARDIOGRAM  ECHOCARDIOGRAM COMPLETE 04/20/2022  Narrative ECHOCARDIOGRAM REPORT    Patient Name:   Alyssa Jefferson Date of Exam: 04/20/2022 Medical Rec #:  [id number]      Height:       62.0 in Accession #:    [id number]     Weight:       224.0 lb Date of Birth:  1946/03/24      BSA:          2.006 m Patient Age:    58 years       BP:           176/88 mmHg Patient Gender: F              HR:           58 bpm. Exam Location:  [place]  Procedure: 2D Echo, Cardiac Doppler and Color Doppler  Indications:    R07.2 Precordial pain  History:        Patient has no prior history of Echocardiogram examinations. Breast cancer, Signs/Symptoms:Murmur; Risk Factors:Hypertension, Sleep Apnea and CKD.  Sonographer:    Marygrace Drought RCS Referring Phys: [id number] [place] A [place] Start  IMPRESSIONS   1. Left ventricular ejection fraction, by estimation, is 60 to 65%. The left ventricle has normal function. The left ventricle has no regional wall motion abnormalities. Left ventricular diastolic parameters were normal. 2. Right ventricular systolic function is normal. The right ventricular size is normal. Tricuspid regurgitation signal is inadequate for assessing PA pressure. 3. The mitral valve is grossly  normal. Trivial mitral valve regurgitation. No evidence of mitral stenosis. 4. The aortic valve is tricuspid. Aortic valve regurgitation is not visualized. No aortic stenosis is present. 5. The inferior vena cava is normal  in size with greater than 50% respiratory variability, suggesting right atrial pressure of 3 mmHg.  Conclusion(s)/Recommendation(s): Normal biventricular function without evidence of hemodynamically significant valvular heart disease.  FINDINGS Left Ventricle: Left ventricular ejection fraction, by estimation, is 60 to 65%. The left ventricle has normal function. The left ventricle has no regional wall motion abnormalities. Global longitudinal strain performed but not reported based on interpreter judgement due to suboptimal tracking. The left ventricular internal cavity size was normal in size. There is no left ventricular hypertrophy. Left ventricular diastolic parameters were normal.  Right Ventricle: The right ventricular size is normal. No increase in right ventricular wall thickness. Right ventricular systolic function is normal. Tricuspid regurgitation signal is inadequate for assessing PA pressure.  Left Atrium: Left atrial size was normal in size.  Right Atrium: Right atrial size was normal in size.  Pericardium: There is no evidence of pericardial effusion.  Mitral Valve: The mitral valve is grossly normal. Trivial mitral valve regurgitation. No evidence of mitral valve stenosis.  Tricuspid Valve: The tricuspid valve is grossly normal. Tricuspid valve regurgitation is trivial. No evidence of tricuspid stenosis.  Aortic Valve: The aortic valve is tricuspid. Aortic valve regurgitation is not visualized. No aortic stenosis is present.  Pulmonic Valve: The pulmonic valve was grossly normal. Pulmonic valve regurgitation is not visualized. No evidence of pulmonic stenosis.  Aorta: The aortic root and ascending aorta are structurally normal, with no evidence of  dilitation.  Venous: The inferior vena cava is normal in size with greater than 50% respiratory variability, suggesting right atrial pressure of 3 mmHg.  IAS/Shunts: The atrial septum is grossly normal.   LEFT VENTRICLE PLAX 2D LVIDd:         5.10 cm   Diastology LVIDs:         3.40 cm   LV e' medial:    7.40 cm/s LV PW:         1.00 cm   LV E/e' medial:  11.6 LV IVS:        0.80 cm   LV e' lateral:   12.30 cm/s LVOT diam:     2.00 cm   LV E/e' lateral: 7.0 LV SV:         78 LV SV Index:   39        2D Longitudinal Strain LVOT Area:     3.14 cm  2D Strain GLS (A2C):   -22.0 % 2D Strain GLS (A3C):   -18.8 % 2D Strain GLS (A4C):   -23.6 % 2D Strain GLS Avg:     -21.4 %  RIGHT VENTRICLE RV Basal diam:  3.50 cm RV S prime:     13.40 cm/s TAPSE (M-mode): 2.2 cm  LEFT ATRIUM             Index        RIGHT ATRIUM           Index LA diam:        3.70 cm 1.84 cm/m   RA Area:     13.60 cm LA Vol (A2C):   77.9 ml 38.83 ml/m  RA Volume:   33.80 ml  16.85 ml/m LA Vol (A4C):   44.4 ml 22.13 ml/m LA Biplane Vol: 59.6 ml 29.71 ml/m AORTIC VALVE LVOT Vmax:   82.20 cm/s LVOT Vmean:  59.100 cm/s LVOT VTI:    0.249 m  AORTA Ao Root diam: 2.90 cm  MITRAL VALVE MV Area (PHT):  SHUNTS MV Decel Time:              Systemic VTI:  0.25 m MV E velocity: 85.90 cm/s   Systemic Diam: 2.00 cm MV A velocity: 114.00 cm/s MV E/A ratio:  0.75  Eleonore Chiquito MD Electronically signed by Eleonore Chiquito MD Signature Date/Time: 04/20/2022/12:09:50 PM    Final              Recent Labs: 03/02/2022: Hemoglobin 11.4; Platelets 258 03/29/2022: BUN 34; Creatinine, Ser 1.42; Potassium 4.5; Sodium 137  Recent Lipid Panel    Component Value Date/Time   CHOL 227 (H) 08/28/2019 1129   TRIG 89 08/28/2019 1129   HDL 72 08/28/2019 1129   CHOLHDL 3.2 08/28/2019 1129   CHOLHDL 3.3 05/15/2014 1415   VLDL 16 05/15/2014 1415   LDLCALC 140 (H) 08/28/2019 1129        Physical Exam:    VS:   BP 128/60   Pulse 60   Ht _0  (1.575 m)   Wt 216 lb (98 kg)   SpO2 96%   BMI 39.51 kg/m     Wt Readings from Last 3 Encounters:  07/29/22 216 lb (98 kg)  03/29/22 224 lb (101.6 kg)  08/20/21 220 lb (99.8 kg)    Gen: no distress, morbid obesity  Neck: No JVD Ears: no Pilar Plate Sign Cardiac: No Friction Rubs or Gallops, systolic Murmur, regular rhythm, +2 radial pulses Respiratory: Clear to auscultation bilaterally, normal effort, normal  respiratory rate GI: Soft, nontender, non-distended  MS: No  edema;  moves all extremities Integument: Skin feels warm Neuro:  At time of evaluation, alert and oriented to person/place/time/situation  Psych: Normal affect, patient feels well   ASSESSMENT:    1. Other pericarditis, unspecified chronicity   2. Essential hypertension   3. Mixed hyperlipidemia   4. Morbid obesity (HCC)     PLAN:    Pericarditis CKD stage IIIa - Resolved; will stop colchicine 0.6 mg PO Daily and ESR, and hsCRP -if returns would need to return foron medication for longer  HTN HLD Morbid obesity - continue diltiazem and aldactone - lipids and ALT today LDL goal < 100   One year me or APP      Medication Adjustments/Labs and Tests Ordered: Current medicines are reviewed at length with the patient today.  Concerns regarding medicines are outlined above.  Orders Placed This Encounter  Procedures   CRP High sensitivity   Sedimentation rate   Lipid panel   ALT   No orders of the defined types were placed in this encounter.   Patient Instructions  Medication Instructions:  Your physician has recommended you make the following change in your medication:   Stop taking Colchicine   *If you need a refill on your cardiac medications before your next appointment, please call your pharmacy*   Lab Work: Lipids, ALT, CRP, ESR If you have labs (blood work) drawn today and your tests are completely normal, you will receive your results only by: [facility] (if you have MyChart) OR A paper copy in the mail If you have any lab test that is abnormal or we need to change your treatment, we will call you to review the results.   Follow-Up: At [facility], you and your health needs are our priority.  As part of our continuing mission to provide you with exceptional heart care, we have created designated Provider Care Teams.  These Care Teams include your primary Cardiologist (physician) and  Advanced Practice Providers (APPs -  Physician Assistants and Nurse Practitioners) who all work together to provide you with the care you need, when you need it.  Your next appointment:   1 year(s)  The format for your next appointment:   In Person  Provider:   Werner Lean, MD     Important Information About Sugar         Signed, Werner Lean, MD  07/29/2022 9:52 AM    [place]

## 2022-07-29 ENCOUNTER — Ambulatory Visit: Payer: Medicare Other | Attending: Internal Medicine | Admitting: Internal Medicine

## 2022-07-29 ENCOUNTER — Encounter: Payer: Self-pay | Admitting: Internal Medicine

## 2022-07-29 VITALS — BP 128/60 | HR 60 | Ht 62.0 in | Wt 216.0 lb

## 2022-07-29 DIAGNOSIS — I319 Disease of pericardium, unspecified: Secondary | ICD-10-CM | POA: Insufficient documentation

## 2022-07-29 DIAGNOSIS — E782 Mixed hyperlipidemia: Secondary | ICD-10-CM

## 2022-07-29 DIAGNOSIS — I318 Other specified diseases of pericardium: Secondary | ICD-10-CM

## 2022-07-29 DIAGNOSIS — I10 Essential (primary) hypertension: Secondary | ICD-10-CM | POA: Diagnosis not present

## 2022-07-29 NOTE — Patient Instructions (Signed)
Medication Instructions:  Your physician has recommended you make the following change in your medication:   Stop taking Colchicine   *If you need a refill on your cardiac medications before your next appointment, please call your pharmacy*   Lab Work: Lipids, ALT, CRP, ESR If you have labs (blood work) drawn today and your tests are completely normal, you will receive your results only by: [place] (if you have MyChart) OR A paper copy in the mail If you have any lab test that is abnormal or we need to change your treatment, we will call you to review the results.   Follow-Up: At [facility], you and your health needs are our priority.  As part of our continuing mission to provide you with exceptional heart care, we have created designated Provider Care Teams.  These Care Teams include your primary Cardiologist (physician) and Advanced Practice Providers (APPs -  Physician Assistants and Nurse Practitioners) who all work together to provide you with the care you need, when you need it.  Your next appointment:   1 year(s)  The format for your next appointment:   In Person  Provider:   Werner Lean, MD     Important Information About Sugar

## 2022-07-30 ENCOUNTER — Other Ambulatory Visit (HOSPITAL_COMMUNITY): Payer: Self-pay | Admitting: Orthopedic Surgery

## 2022-07-30 DIAGNOSIS — Z96652 Presence of left artificial knee joint: Secondary | ICD-10-CM

## 2022-07-30 LAB — LIPID PANEL
Chol/HDL Ratio: 2.9 ratio (ref 0.0–4.4)
Cholesterol, Total: 173 mg/dL (ref 100–199)
HDL: 59 mg/dL (ref >39)
LDL Chol Calc (NIH): 98 mg/dL (ref 0–99)
Triglycerides: 85 mg/dL (ref 0–149)
VLDL Cholesterol Cal: 16 mg/dL (ref 5–40)

## 2022-07-30 LAB — ALT: ALT: 29 IU/L (ref 0–32)

## 2022-07-30 LAB — SEDIMENTATION RATE: Sed Rate: 85 mm/hr — ABNORMAL HIGH (ref 0–40)

## 2022-07-30 LAB — HIGH SENSITIVITY CRP: CRP, High Sensitivity: 35.17 mg/L — ABNORMAL HIGH (ref 0.00–3.00)

## 2022-08-10 ENCOUNTER — Encounter (HOSPITAL_COMMUNITY)
Admission: RE | Admit: 2022-08-10 | Discharge: 2022-08-10 | Disposition: A | Discharge status: Home / Self Care | Payer: Medicare Other | Source: Ambulatory Visit | Attending: Orthopedic Surgery | Admitting: Orthopedic Surgery

## 2022-08-10 ENCOUNTER — Telehealth: Payer: Self-pay | Admitting: Internal Medicine

## 2022-08-10 DIAGNOSIS — Z96652 Presence of left artificial knee joint: Secondary | ICD-10-CM | POA: Diagnosis present

## 2022-08-10 MED ORDER — TECHNETIUM TC 99M MEDRONATE IV KIT
20.0000 | PACK | Freq: Once | INTRAVENOUS | Status: AC | PRN
  Administered 2022-08-10: 21.3 via INTRAVENOUS

## 2022-08-10 NOTE — Telephone Encounter (Signed)
Results: Inflammatory markers are elevated Plan: Sends results to PCP Trial off colchicine   Werner Lean, MD

## 2022-08-10 NOTE — Telephone Encounter (Signed)
Pt calling back for lab results

## 2022-08-10 NOTE — Telephone Encounter (Signed)
Pt aware of lab results and notes pain has returned under arm Per pt has restarted taking med (colchicine )Will forward to PCP as well to Dr Gasper Sells .Adonis Housekeeper

## 2022-08-12 NOTE — Telephone Encounter (Signed)
Left a message to call back.

## 2022-08-13 MED ORDER — COLCHICINE 0.6 MG PO TABS: 0.6000 mg | ORAL_TABLET | Freq: Every day | ORAL | 1 refills | Status: DC

## 2022-08-13 NOTE — Telephone Encounter (Signed)
Called pt advised of MD recommendation:   Will keep her one colchicine for another 6 months and trial off at that time.   Pt wants to know why will have to take med for 6 months.  Wants to feel better before 6 months.  Advised pt I will send message to MD to address concern.  Medication sent into pharmacy.

## 2022-08-13 NOTE — Addendum Note (Signed)
Addended by: Precious Gilding on: 08/13/2022 12:51 PM   Modules accepted: Orders

## 2022-08-13 NOTE — Telephone Encounter (Signed)
Spoke with pt who states that someone called her yesterday. Will route to [facility] as she left the pt a vm. Pt states that she still has pain in her left arm and she has been taking the cholchine.

## 2022-08-13 NOTE — Telephone Encounter (Signed)
Patient returned call

## 2022-08-18 NOTE — Telephone Encounter (Signed)
Left a message to call back.

## 2022-08-19 ENCOUNTER — Telehealth: Payer: Self-pay | Admitting: Internal Medicine

## 2022-08-19 NOTE — Telephone Encounter (Signed)
Please see previous phone note for more details.

## 2022-08-19 NOTE — Telephone Encounter (Signed)
Called pt advised of MD response:  She felt better at our visit- she was still on the colchicine. Her inflammatory markers were elevated. If this pain is cardiac in origin, decreasing the inflammation will improve her symptoms.   Pt reports continues to have CP lately even with taking colchicine.  Pt goes down left arm.  Also, expresses CP is worse when leans forward.   Denies jaw/neck pain and diaphoresis.    Advised pt will send to MD to review.

## 2022-08-19 NOTE — Telephone Encounter (Signed)
Pt returning nurses call regarding results. Please advise 

## 2022-08-20 ENCOUNTER — Other Ambulatory Visit: Payer: Self-pay | Admitting: Internal Medicine

## 2022-08-23 NOTE — Telephone Encounter (Signed)
Left a message to call back.

## 2022-08-24 ENCOUNTER — Telehealth: Payer: Self-pay | Admitting: Internal Medicine

## 2022-08-24 MED ORDER — ASPIRIN 325 MG PO TBEC: 325.0000 mg | DELAYED_RELEASE_TABLET | Freq: Every day | ORAL | 0 refills | Status: AC

## 2022-08-24 NOTE — Telephone Encounter (Signed)
Patient states she is returning nurse Shamea's call.

## 2022-08-24 NOTE — Addendum Note (Signed)
Addended by: Precious Gilding on: 08/24/2022 01:27 PM   Modules accepted: Orders

## 2022-08-24 NOTE — Telephone Encounter (Signed)
Colchicine does not frequently cause hair loss. It's listed as a possible side effect with postmarketing reports, but incidence isn't listed due to low frequency.

## 2022-08-24 NOTE — Telephone Encounter (Signed)
Called pt advised of MD recommendation.  Pt does not want to take ibuprofen d/t kidney function.  Pt would also like to know why has inflammation. Spoke with MD suggested that pt take Aspirin 325 mg PO QD for [redacted] weeks along with colchicine as ordered.  Aspirin will not be as effective at controlling pt symptoms but is an alternative.  Pt to f/u with PCP and possibly see a rheumatologist to figure out why pt has inflammation.   Pt will take aspirin 325 mg PO QD.   Pt concerned that colchicine maybe causing hair loss.  Advised will send to pharmacist to see if this is a side effect.

## 2022-08-26 NOTE — Telephone Encounter (Signed)
Called pt advised of pharmacist response:  Colchicine does not frequently cause hair loss. It's listed as a possible side effect with postmarketing reports, but incidence isn't listed due to low frequency.   All questions answered pt had no further questions or concerns.

## 2022-08-26 NOTE — Telephone Encounter (Signed)
Called pt please see previous telephone encounter.

## 2022-09-06 ENCOUNTER — Telehealth: Payer: Self-pay | Admitting: Internal Medicine

## 2022-09-06 NOTE — Telephone Encounter (Signed)
Spoke with the patient to follow up on complaint of chest (heart pain).  Patient denies SOB, nausea and palpitations. Patient has not checked her BP or heart rate, or taken nitroglycerin. Patient states colchicine and aspirin are not working.   Reviewed notes and advised patient that Dr Dimas Chyle wanted her to try a 2 week course of ibuprofen 600 mg TID for inflammation.  Patient has concerns regarding kidney function. Advised that per Dr Oralia Rud notes he is aware of her kidney function and has considered this in her course of treatment. Patient verbalized understanding and stated she would try the ibuprofen and let us know how she does.

## 2022-09-06 NOTE — Telephone Encounter (Signed)
Pt c/o of Chest Pain: STAT if CP now or developed within 24 hours  1. Are you having CP right now?   Heart is hurting  2. Are you experiencing any other symptoms (ex. SOB, nausea, vomiting, sweating)?   No  3. How long have you been experiencing CP?   Started last week  4. Is your CP continuous or coming and going? Today it is continuous  5. Have you taken Nitroglycerin?   No  Patient stated her heart is still hurting and the medication (colchicine 0.6 MG tablet)  is not working for her.  ?

## 2022-09-28 ENCOUNTER — Telehealth: Payer: Self-pay | Admitting: Gastroenterology

## 2022-09-28 NOTE — Telephone Encounter (Signed)
Inbound call from patient stating that she is having rectal bleeding and is requesting a call back to discuss. Please advise.

## 2022-09-28 NOTE — Telephone Encounter (Signed)
Returned call to patient. Pt reports that 2 weeks ago she started passing a small amount of bright red blood. Patient denies any rectal pain. Pt does have a history of constipation, she states that Miralax did not work for her. Pt states that apple juice was keeping her regular, she stopped it and that's when she started having issues with constipation again. I informed patient that it is likely hemorrhoidal bleeding that she is experiencing. Pt has been advised to limit commode time, use wet wipes instead of toilet paper, pat the area dry do not vigorously rub the area, use preparation H suppositories over the counter as needed, and drink plenty of fluids or water. Pt verbalized understanding and had no concerns at the end of the call.

## 2022-10-18 ENCOUNTER — Other Ambulatory Visit: Payer: Self-pay

## 2022-10-18 NOTE — Telephone Encounter (Signed)
Pt is requesting a refill on colchicine. Would Dr. Gasper Sells like to refill this medication? Please address

## 2022-12-14 ENCOUNTER — Other Ambulatory Visit: Payer: Self-pay | Admitting: Family Medicine

## 2022-12-14 DIAGNOSIS — Z1231 Encounter for screening mammogram for malignant neoplasm of breast: Secondary | ICD-10-CM

## 2023-01-03 ENCOUNTER — Encounter (HOSPITAL_COMMUNITY): Payer: Self-pay | Admitting: Emergency Medicine

## 2023-01-03 ENCOUNTER — Other Ambulatory Visit: Payer: Self-pay

## 2023-01-03 ENCOUNTER — Ambulatory Visit (HOSPITAL_COMMUNITY)
Admission: EM | Admit: 2023-01-03 | Discharge: 2023-01-03 | Disposition: A | Discharge status: Home / Self Care | Payer: Medicare Other

## 2023-01-03 DIAGNOSIS — R21 Rash and other nonspecific skin eruption: Secondary | ICD-10-CM

## 2023-01-03 MED ORDER — DEXAMETHASONE SODIUM PHOSPHATE 10 MG/ML IJ SOLN
10.0000 mg | Freq: Once | INTRAMUSCULAR | Status: AC
  Administered 2023-01-03: 10 mg via INTRAMUSCULAR

## 2023-01-03 MED ORDER — TRIAMCINOLONE ACETONIDE 0.1 % EX CREA: 1.0000 | TOPICAL_CREAM | Freq: Two times a day (BID) | CUTANEOUS | 0 refills | Status: DC

## 2023-01-03 MED ORDER — DEXAMETHASONE SODIUM PHOSPHATE 10 MG/ML IJ SOLN
INTRAMUSCULAR | Status: AC
  Filled 2023-01-03: qty 1

## 2023-01-03 NOTE — ED Provider Notes (Signed)
[facility]    CSN: [id number] Arrival date & time: 01/03/23  1843      History   Chief Complaint Chief Complaint  Patient presents with   Rash    HPI Alyssa Jefferson is a 77 y.o. female.    Subjective:   Alyssa Jefferson is a 77 y.o. female who presents for evaluation of rash. Rash started 2 days ago.  Patient was sitting at the table of a friend's house and started to itch.  Affected areas on her arms and upper chest.  Lesions are pink in color, are of flat texture. Rash has not changed over time.  Rash is pruritic. Patient denies: arthralgia, fever, headache, irritability, or myalgia.notably, patient reports that this has happened before the same person's house a couple years ago.  Denies any new exposures.  She denies seeing anything bite her or come across the skin.  The following portions of the patient's history were reviewed and updated as appropriate: allergies, current medications, past family history, past medical history, past social history, past surgical history, and problem list.        Past Medical History:  Diagnosis Date   Arthritis    knees, back   Cancer (HCC) 2004   breast   Depression    GERD (gastroesophageal reflux disease)    Glaucoma    Heart palpitations    High cholesterol    History of transfusion    many years ago   Hypertension    Hypothyroidism    Itching in the vaginal area    Nocturia    OSA (obstructive sleep apnea)    tried CPAP- but unsuccessful   Renal disorder    Thyroid disease    thyroid removed    Patient Active Problem List   Diagnosis Date Noted   Pericarditis 07/29/2022   Precordial chest pain 03/29/2022   Heart murmur, systolic 03/29/2022   Morbid obesity (HCC) 03/29/2022   CKD (chronic kidney disease), stage III (HCC) 09/12/2017   Secondary hyperparathyroidism (HCC) 09/12/2017   OA (osteoarthritis) of knee 07/28/2015   Palpitations 10/17/2014   OSA (obstructive sleep apnea) 10/16/2012   BMI  40.0-44.9, adult (HCC) 12/20/2011   Hypothyroid 12/20/2011   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 12/20/2011   Osteoarthritis, knee 12/20/2011   Mixed hyperlipidemia 12/20/2011   Depressive disorder, not elsewhere classified 12/20/2011   Essential hypertension 12/20/2011   Anemia 12/20/2011   Cancer (HCC)     Past Surgical History:  Procedure Laterality Date   ABDOMINAL HYSTERECTOMY  82   BSO   BREAST BIOPSY Right pt doesnt remember   benign   BREAST SURGERY  2004   left breast removed due to cancer no chemo or radiation   MASTECTOMY Left    THYROIDECTOMY     due to knot   TOTAL KNEE ARTHROPLASTY Left 07/28/2015   Procedure: LEFT TOTAL KNEE ARTHROPLASTY;  Surgeon: Ollen Gross, MD;  Location: [facility];  Service: Orthopedics;  Laterality: Left;    OB History     Gravida  0   Para      Term      Preterm      AB      Living         SAB      IAB      Ectopic      Multiple      Live Births  Home Medications    Prior to Admission medications   Medication Sig Start Date End Date Taking? Authorizing Provider  cephALEXin (KEFLEX) 500 MG capsule Take 500 mg by mouth 2 (two) times daily. 12/21/22  Yes [provider]  triamcinolone cream (KENALOG) 0.1 % Apply 1 Application topically 2 (two) times daily. Apply to affected areas twice a day until symptoms resolve or up to 2 weeks 01/03/23  Yes Lurline Idol, FNP  albuterol (VENTOLIN HFA) 108 (90 Base) MCG/ACT inhaler Inhale 2 puffs into the lungs every 6 (six) hours as needed for wheezing or shortness of breath. 01/01/19   Doristine Bosworth, MD  colchicine 0.6 MG tablet Take 1 tablet (0.6 mg total) by mouth daily. 08/13/22   Riley Lam A, MD  diclofenac Sodium (VOLTAREN) 1 % GEL Apply 4 g topically 4 (four) times daily as needed. 08/20/21   Tilden Fossa, MD  diltiazem (CARDIZEM CD) 180 MG 24 hr capsule TAKE 1 CAPSULE(180 MG) BY MOUTH DAILY 07/21/20   Georgina Quint, MD  dorzolamide-timolol (COSOPT) 22.3-6.8 MG/ML ophthalmic solution INSTILL 1 DROP IN RIGHT EYE TWICE DAILY 11/05/20   [provider]  esomeprazole (NEXIUM) 40 MG capsule TAKE 1 CAPSULE BY MOUTH DAILY AT 12 NOON 01/07/20   Collie Siad A, MD  ezetimibe (ZETIA) 10 MG tablet daily.    [provider]  latanoprost (XALATAN) 0.005 % ophthalmic solution Place 1 drop into both eyes at bedtime. 01/01/19   Doristine Bosworth, MD  levothyroxine (SYNTHROID) 50 MCG tablet Take 1 tablet (50 mcg total) by mouth daily. 10/17/19   Doristine Bosworth, MD  Polyethylene Glycol 3350 (MIRALAX PO) Take 17 g by mouth as needed (constipation).    [provider]  rosuvastatin (CRESTOR) 20 MG tablet TAKE 1 TABLET(20 MG) BY MOUTH AT BEDTIME 01/07/20   Doristine Bosworth, MD  spironolactone (ALDACTONE) 50 MG tablet Take one tablet by mouth daily 10/17/19   Doristine Bosworth, MD    Family History Family History  Problem Relation Age of Onset   Lung cancer Father    Breast cancer Sister    Ovarian cancer Sister    Colon cancer Neg Hx    Esophageal cancer Neg Hx    Stomach cancer Neg Hx    Pancreatic cancer Neg Hx    Liver disease Neg Hx     Social History Social History   Tobacco Use   Smoking status: Former    Years: 20    Types: Cigarettes    Quit date: 08/16/1978    Years since quitting: 44.4   Smokeless tobacco: Never   Tobacco comments:    1 pack per week--10/16/12  Vaping Use   Vaping Use: Never used  Substance Use Topics   Alcohol use: No   Drug use: No     Allergies   Codeine and Lipitor [atorvastatin calcium]   Review of Systems Review of Systems  Constitutional:  Negative for fever.  HENT:  Negative for sore throat and trouble swallowing.   Musculoskeletal:  Negative for myalgias.  Skin:  Positive for rash.  All other systems reviewed and are negative.    Physical Exam Triage Vital Signs ED Triage Vitals  Enc Vitals Group     BP 01/03/23 1951 (!)  142/64     Pulse Rate 01/03/23 1951 68     Resp 01/03/23 1951 20     Temp 01/03/23 1951 98.3 F (36.8 C)     Temp Source 01/03/23 1951  Oral     SpO2 01/03/23 1951 98 %     Weight --      Height --      Head Circumference --      Peak Flow --      Pain Score 01/03/23 1947 0     Pain Loc --      Pain Edu? --      Excl. in GC? --    No data found.  Updated Vital Signs BP (!) 142/64 (BP Location: Right Arm) Comment (BP Location): large cuff  Pulse 68   Temp 98.3 F (36.8 C) (Oral)   Resp 20   SpO2 98%   Visual Acuity Right Eye Distance:   Left Eye Distance:   Bilateral Distance:    Right Eye Near:   Left Eye Near:    Bilateral Near:     Physical Exam Vitals reviewed.  Constitutional:      General: She is not in acute distress.    Appearance: Normal appearance. She is not toxic-appearing.  HENT:     Head: Normocephalic.     Nose: Nose normal.     Mouth/Throat:     Pharynx: Oropharynx is clear.  Cardiovascular:     Rate and Rhythm: Normal rate.  Pulmonary:     Effort: Pulmonary effort is normal.  Musculoskeletal:        General: Normal range of motion.     Cervical back: Normal range of motion and neck supple.  Skin:    General: Skin is warm and dry.     Findings: Rash present. Rash is macular.     Comments: Diffused macular rash noted to the upper arms, forearm and across the upper anterior chest   Neurological:     General: No focal deficit present.     Mental Status: She is alert and oriented to person, place, and time.      UC Treatments / Results  Labs (all labs ordered are listed, but only abnormal results are displayed) Labs Reviewed - No data to display  EKG   Radiology No results found.  Procedures Procedures (including critical care time)  Medications Ordered in UC Medications  dexamethasone (DECADRON) injection 10 mg (has no administration in time range)    Initial Impression / Assessment and Plan / UC Course  I have reviewed the  triage vital signs and the nursing notes.  Pertinent labs & imaging results that were available during my care of the patient were reviewed by me and considered in my medical decision making (see chart for details).    77 year old female presenting with an acute contact dermatitis.  She is afebrile and nontoxic.  Physical exam as above.  Injection given in clinic.  Prescription for triamcinolone cream written. Aveeno baths. Benadryl prn for itching. Observe for signs of superimposed infection and systemic symptoms. Skin moisturizer. Watch for signs of fever or worsening of the rash.  Today's evaluation has revealed no signs of a dangerous process. Discussed diagnosis with patient and/or guardian. Patient and/or guardian aware of their diagnosis, possible red flag symptoms to watch out for and need for close follow up. Patient and/or guardian understands verbal and written discharge instructions. Patient and/or guardian comfortable with plan and disposition.  Patient and/or guardian has a clear mental status at this time, good insight into illness (after discussion and teaching) and has clear judgment to make decisions regarding their care  Documentation was completed with the aid of voice recognition software. Transcription may contain  typographical errors. Final Clinical Impressions(s) / UC Diagnoses   Final diagnoses:  Rash and nonspecific skin eruption     Discharge Instructions      You have been seen today for a rash.  The goal of treatment is to stop the itching and keep the rash from spreading.  Pay attention to any changes in your symptoms.  You have been given a steroid injection here in the clinic. Steroids works on the immune system to help relieve swelling, redness, itching, and allergic reactions.  Use the cream as prescribed.  You may continue to take oral benadryl as needed for itching.      ED Prescriptions     Medication Sig Dispense Auth. Provider   triamcinolone  cream (KENALOG) 0.1 % Apply 1 Application topically 2 (two) times daily. Apply to affected areas twice a day until symptoms resolve or up to 2 weeks 15 g Lurline Idol, FNP      PDMP not reviewed this encounter.   Lurline Idol, [place] 01/03/23 2024

## 2023-01-03 NOTE — ED Triage Notes (Addendum)
Patient thinks she may have been bitten or stung by an insect on Saturday.  Since, has had intermittent hives to chest and both arms. Reports these area do itch.    Patient reports taking benadryl Patient is taking cephalexin for a uti per patient-patient has 2 more pills to take

## 2023-01-03 NOTE — Discharge Instructions (Addendum)
You have been seen today for a rash.  The goal of treatment is to stop the itching and keep the rash from spreading.  Pay attention to any changes in your symptoms.  You have been given a steroid injection here in the clinic. Steroids works on the immune system to help relieve swelling, redness, itching, and allergic reactions.  Use the cream as prescribed.  You may continue to take oral benadryl as needed for itching.

## 2023-01-10 ENCOUNTER — Ambulatory Visit (HOSPITAL_COMMUNITY)
Admission: EM | Admit: 2023-01-10 | Discharge: 2023-01-10 | Disposition: A | Discharge status: Home / Self Care | Payer: Medicare Other | Attending: Family Medicine | Admitting: Family Medicine

## 2023-01-10 ENCOUNTER — Encounter (HOSPITAL_COMMUNITY): Payer: Self-pay

## 2023-01-10 DIAGNOSIS — B379 Candidiasis, unspecified: Secondary | ICD-10-CM | POA: Diagnosis present

## 2023-01-10 DIAGNOSIS — N760 Acute vaginitis: Secondary | ICD-10-CM

## 2023-01-10 MED ORDER — KETOCONAZOLE 2 % EX CREA: 1.0000 | TOPICAL_CREAM | Freq: Every day | CUTANEOUS | 0 refills | Status: DC

## 2023-01-10 NOTE — Discharge Instructions (Signed)
Ketoconazole cream--apply once daily to the irritated area till better  Staff will notify you if anything is positive on the swab test we did.

## 2023-01-10 NOTE — ED Provider Notes (Signed)
[facility]    CSN: [id number] Arrival date & time: 01/10/23  1000      History   Chief Complaint Chief Complaint  Patient presents with   Vaginal Discharge    HPI Alyssa Jefferson is a 77 y.o. female.    Vaginal Discharge Here for irritation of her perineum and brown discoloration of that area.  Staff initially understood her to say she had brown vaginal discharge but she does not give me that history.  She does not feel irritated inside her vaginal area.  No abdominal pain  She has had a hysterectomy some years ago.  She was seen here 1 week ago for a rash on her upper torso and arms.  She was given 1 injection of Decadron then.  She states today that the perineal irritation was already going on before that.  Past Medical History:  Diagnosis Date   Arthritis    knees, back   Cancer (HCC) 2004   breast   Depression    GERD (gastroesophageal reflux disease)    Glaucoma    Heart palpitations    High cholesterol    History of transfusion    many years ago   Hypertension    Hypothyroidism    Itching in the vaginal area    Nocturia    OSA (obstructive sleep apnea)    tried CPAP- but unsuccessful   Renal disorder    Thyroid disease    thyroid removed    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 12/20/2011   Osteoarthritis, knee 12/20/2011   Mixed hyperlipidemia 12/20/2011   Depressive disorder, not elsewhere classified 12/20/2011   Essential hypertension 12/20/2011    Anemia 12/20/2011   Cancer (HCC)     Past Surgical History:  Procedure Laterality Date   ABDOMINAL HYSTERECTOMY  83   BSO   BREAST BIOPSY Right pt doesnt remember   benign   BREAST SURGERY  2004   left breast removed due to cancer no chemo or radiation   MASTECTOMY Left    THYROIDECTOMY     due to knot   TOTAL KNEE ARTHROPLASTY Left 07/28/2015   Procedure: LEFT TOTAL KNEE ARTHROPLASTY;  Surgeon: Ollen Gross, MD;  Location: [facility];  Service: Orthopedics;  Laterality: Left;    OB History     Gravida  0   Para      Term      Preterm      AB      Living         SAB      IAB      Ectopic      Multiple      Live Births               Home Medications    Prior to Admission medications   Medication Sig Start Date End Date Taking? Authorizing Provider  ketoconazole (NIZORAL) 2 % cream Apply 1 Application topically daily. To affected area till better 01/10/23  Yes Schwanda Zima, Janace Aris, MD  albuterol (VENTOLIN  HFA) 108 (90 Base) MCG/ACT inhaler Inhale 2 puffs into the lungs every 6 (six) hours as needed for wheezing or shortness of breath. 01/01/19   Doristine Bosworth, MD  colchicine 0.6 MG tablet Take 1 tablet (0.6 mg total) by mouth daily. 08/13/22   Riley Lam A, MD  diclofenac Sodium (VOLTAREN) 1 % GEL Apply 4 g topically 4 (four) times daily as needed. 08/20/21   Tilden Fossa, MD  diltiazem (CARDIZEM CD) 180 MG 24 hr capsule TAKE 1 CAPSULE(180 MG) BY MOUTH DAILY 07/21/20   Georgina Quint, MD  dorzolamide-timolol (COSOPT) 22.3-6.8 MG/ML ophthalmic solution INSTILL 1 DROP IN RIGHT EYE TWICE DAILY 11/05/20   [provider]  esomeprazole (NEXIUM) 40 MG capsule TAKE 1 CAPSULE BY MOUTH DAILY AT 12 NOON 01/07/20   Collie Siad A, MD  ezetimibe (ZETIA) 10 MG tablet daily.    [provider]  latanoprost (XALATAN) 0.005 % ophthalmic solution Place 1 drop into both eyes at bedtime. 01/01/19   Doristine Bosworth, MD  levothyroxine  (SYNTHROID) 50 MCG tablet Take 1 tablet (50 mcg total) by mouth daily. 10/17/19   Doristine Bosworth, MD  Polyethylene Glycol 3350 (MIRALAX PO) Take 17 g by mouth as needed (constipation).    [provider]  rosuvastatin (CRESTOR) 20 MG tablet TAKE 1 TABLET(20 MG) BY MOUTH AT BEDTIME 01/07/20   Collie Siad A, MD  spironolactone (ALDACTONE) 50 MG tablet Take one tablet by mouth daily 10/17/19   Collie Siad A, MD  triamcinolone cream (KENALOG) 0.1 % Apply 1 Application topically 2 (two) times daily. Apply to affected areas twice a day until symptoms resolve or up to 2 weeks 01/03/23   Lurline Idol, FNP    Family History Family History  Problem Relation Age of Onset   Lung cancer Father    Breast cancer Sister    Ovarian cancer Sister    Colon cancer Neg Hx    Esophageal cancer Neg Hx    Stomach cancer Neg Hx    Pancreatic cancer Neg Hx    Liver disease Neg Hx     Social History Social History   Tobacco Use   Smoking status: Former    Years: 20    Types: Cigarettes    Quit date: 08/16/1978    Years since quitting: 44.4   Smokeless tobacco: Never   Tobacco comments:    1 pack per week--10/16/12  Vaping Use   Vaping Use: Never used  Substance Use Topics   Alcohol use: No   Drug use: No     Allergies   Codeine and Lipitor [atorvastatin calcium]   Review of Systems Review of Systems  Genitourinary:  Positive for vaginal discharge.     Physical Exam Triage Vital Signs ED Triage Vitals  Enc Vitals Group     BP 01/10/23 1101 136/62     Pulse Rate 01/10/23 1100 (!) 56     Resp 01/10/23 1100 18     Temp 01/10/23 1100 98.6 F (37 C)     Temp Source 01/10/23 1100 Oral     SpO2 01/10/23 1100 98 %     Weight --      Height --      Head Circumference --      Peak Flow --      Pain Score --      Pain Loc --      Pain Edu? --      Excl. in GC? --  No data found.  Updated Vital Signs BP 136/62 (BP Location: Left Arm)   Pulse (!) 56   Temp 98.6 F (37  C) (Oral)   Resp 18   SpO2 98%   Visual Acuity Right Eye Distance:   Left Eye Distance:   Bilateral Distance:    Right Eye Near:   Left Eye Near:    Bilateral Near:     Physical Exam Vitals reviewed.  Constitutional:      General: She is not in acute distress.    Appearance: She is not ill-appearing, toxic-appearing or diaphoretic.  HENT:     Mouth/Throat:     Mouth: Mucous membranes are moist.  Genitourinary:    Comments: I cannot see any skin breakdown or ulceration.  Also there is no erythema or skin induration or rash.  The vaginal introitus appears normal.  Vaginal swab was done at that time. Skin:    Coloration: Skin is not jaundiced or pale.  Neurological:     Mental Status: She is alert and oriented to person, place, and time.  Psychiatric:        Behavior: Behavior normal.      UC Treatments / Results  Labs (all labs ordered are listed, but only abnormal results are displayed) Labs Reviewed  CERVICOVAGINAL ANCILLARY ONLY    EKG   Radiology No results found.  Procedures Procedures (including critical care time)  Medications Ordered in UC Medications - No data to display  Initial Impression / Assessment and Plan / UC Course  I have reviewed the triage vital signs and the nursing notes.  Pertinent labs & imaging results that were available during my care of the patient were reviewed by me and considered in my medical decision making (see chart for details).        Vaginal swab is done for cytology during the exam.  Staff will notify her of any positives and treat per protocol  Nizoral cream is sent in to treat for possible yeast Final Clinical Impressions(s) / UC Diagnoses   Final diagnoses:  Acute vaginitis  Candidiasis     Discharge Instructions      Ketoconazole cream--apply once daily to the irritated area till better  Staff will notify you if anything is positive on the swab test we did.     ED Prescriptions      Medication Sig Dispense Auth. Provider   ketoconazole (NIZORAL) 2 % cream Apply 1 Application topically daily. To affected area till better 60 g Marlinda Mike Janace Aris, MD      PDMP not reviewed this encounter.   Zenia Resides, MD 01/10/23 [id number]

## 2023-01-10 NOTE — ED Triage Notes (Addendum)
Pt reports brownish vaginal discharge x 1-2 weeks.  Pt reports she has peeling around her vagina. Denies using any new soap or cream.

## 2023-01-11 LAB — CERVICOVAGINAL ANCILLARY ONLY
Bacterial Vaginitis (gardnerella): NEGATIVE
Candida Glabrata: NEGATIVE
Candida Vaginitis: POSITIVE — AB
Chlamydia: NEGATIVE
Comment: NEGATIVE
Comment: NEGATIVE
Comment: NEGATIVE
Comment: NEGATIVE
Comment: NEGATIVE
Comment: NORMAL
Neisseria Gonorrhea: NEGATIVE
Trichomonas: NEGATIVE

## 2023-01-14 ENCOUNTER — Telehealth: Payer: Self-pay | Admitting: Internal Medicine

## 2023-01-14 NOTE — Telephone Encounter (Signed)
Called pt back to address concerns.  Reports Kidney fxn has dropped from 40 to 20 and thinks it d/t colchicine.   Pt sees nephrology asked if nephrology feels that coclchicine is causing drop in kidney fxn.  Pt reports no that she thinks it is.   Is currently taking medication.  Is due to stop in 6 months from start date in Dec.  Advised 6 months will be coming up soon.   Advised pt to contact PCP in regards to cramps and numbness in fingers.  Expresses will contact PCP.    Will send to MD to address pt concerns.  Stop date for medication and repeat labs to see if medication is/was effective.

## 2023-01-14 NOTE — Telephone Encounter (Signed)
Pt c/o medication issue:  1. Name of Medication:   colchicine 0.6 MG tablet    2. How are you currently taking this medication (dosage and times per day)?   Take 1 tablet (0.6 mg total) by mouth daily.    3. Are you having a reaction (difficulty breathing--STAT)? No  4. What is your medication issue? Pt would like a callback after stating her fingers are cramping up and losing feeling in her thumbs, may be effecting her kidneys as well, she stated her numbers are going down.

## 2023-01-17 NOTE — Telephone Encounter (Signed)
Called pt advised of MD response: Clinically her CKD progression is likely multi-factorial.  If she is chest pain free, she can stop colchicine.  If she has a return of this, we will have to bring her back for a discussion of rilonacept   Pt reports has not had CP recently.  Will stop medication and let us know if CP returns. D/C colchicine from medication list. Pt had no further questions or concerns.

## 2023-01-18 ENCOUNTER — Ambulatory Visit
Admission: RE | Admit: 2023-01-18 | Discharge: 2023-01-18 | Disposition: A | Discharge status: Home / Self Care | Payer: Medicare Other | Source: Ambulatory Visit | Attending: Family Medicine | Admitting: Family Medicine

## 2023-01-18 DIAGNOSIS — Z1231 Encounter for screening mammogram for malignant neoplasm of breast: Secondary | ICD-10-CM

## 2023-01-18 HISTORY — DX: Malignant neoplasm of unspecified site of unspecified female breast: C50.919

## 2023-02-02 ENCOUNTER — Other Ambulatory Visit: Payer: Self-pay | Admitting: Family Medicine

## 2023-02-02 ENCOUNTER — Ambulatory Visit
Admission: RE | Admit: 2023-02-02 | Discharge: 2023-02-02 | Disposition: A | Discharge status: Home / Self Care | Payer: Medicare Other | Source: Ambulatory Visit | Attending: Family Medicine | Admitting: Family Medicine

## 2023-02-02 DIAGNOSIS — M25551 Pain in right hip: Secondary | ICD-10-CM

## 2023-02-02 DIAGNOSIS — R200 Anesthesia of skin: Secondary | ICD-10-CM

## 2023-04-20 ENCOUNTER — Telehealth: Payer: Self-pay | Admitting: Internal Medicine

## 2023-04-20 NOTE — Telephone Encounter (Signed)
Patient is requesting to switch from Dr. Izora Ribas to Dr. Duke Salvia.  Please advise.

## 2023-04-29 NOTE — Telephone Encounter (Signed)
Discussed with Dr Duke Salvia and she is ok with switch.  Will forward to scheduling team to arrange appointment

## 2023-04-29 NOTE — Telephone Encounter (Signed)
Patient called to follow-up on provider switch.

## 2023-05-05 ENCOUNTER — Telehealth: Payer: Self-pay | Admitting: Cardiovascular Disease

## 2023-05-05 DIAGNOSIS — M79606 Pain in leg, unspecified: Secondary | ICD-10-CM

## 2023-05-05 DIAGNOSIS — I10 Essential (primary) hypertension: Secondary | ICD-10-CM

## 2023-05-05 NOTE — Telephone Encounter (Signed)
Jeanette with [facility] called in with the patient on the line. She states patient had a quantaflo circulation test done with them and the report came back abnormal in her left foot. She would like to know if Dr. Duke Salvia has recommendations or if she can be worked in for an appointment soon. Please advise.  Please return call to patient. If questions, call may be returned to [facility] at [phone].

## 2023-05-06 NOTE — Telephone Encounter (Signed)
Spoke with patient and she agreeable to plan  Did ask patient if she was having any leg pain and she stated she did Message sent to Charpelle to get scheduled

## 2023-05-06 NOTE — Telephone Encounter (Signed)
Can order bilateral LE ABI to be performed prior to OV.   Alver Sorrow, NP

## 2023-05-11 ENCOUNTER — Ambulatory Visit (HOSPITAL_COMMUNITY)
Admission: RE | Admit: 2023-05-11 | Discharge: 2023-05-11 | Disposition: A | Discharge status: Home / Self Care | Payer: Medicare Other | Source: Ambulatory Visit | Attending: Cardiology | Admitting: Cardiology

## 2023-05-11 DIAGNOSIS — I10 Essential (primary) hypertension: Secondary | ICD-10-CM | POA: Insufficient documentation

## 2023-05-11 DIAGNOSIS — M79604 Pain in right leg: Secondary | ICD-10-CM | POA: Diagnosis present

## 2023-05-11 DIAGNOSIS — M79606 Pain in leg, unspecified: Secondary | ICD-10-CM | POA: Diagnosis present

## 2023-05-11 LAB — VAS US ABI WITH/WO TBI
Left ABI: 1.04
Right ABI: 1.04

## 2023-05-12 ENCOUNTER — Telehealth (HOSPITAL_BASED_OUTPATIENT_CLINIC_OR_DEPARTMENT_OTHER): Payer: Self-pay

## 2023-05-12 DIAGNOSIS — R6889 Other general symptoms and signs: Secondary | ICD-10-CM

## 2023-05-12 NOTE — Telephone Encounter (Addendum)
Results called to patient who verbalizes understanding! Testing ordered, message routed to scheduling team to get the patient set up.    ----- Message from Alver Sorrow sent at 05/12/2023  9:30 AM EDT ----- Normal left ABI.  Right ABI abnormal suggestive of peripheral arterial disease. Recommend aortoiliac duplex for further assessment.

## 2023-05-24 ENCOUNTER — Encounter (HOSPITAL_BASED_OUTPATIENT_CLINIC_OR_DEPARTMENT_OTHER): Payer: Self-pay

## 2023-05-31 ENCOUNTER — Encounter (HOSPITAL_BASED_OUTPATIENT_CLINIC_OR_DEPARTMENT_OTHER): Payer: Medicare Other

## 2023-06-09 ENCOUNTER — Ambulatory Visit (HOSPITAL_BASED_OUTPATIENT_CLINIC_OR_DEPARTMENT_OTHER): Payer: Medicare Other

## 2023-06-09 ENCOUNTER — Other Ambulatory Visit (HOSPITAL_BASED_OUTPATIENT_CLINIC_OR_DEPARTMENT_OTHER): Payer: Self-pay

## 2023-06-09 DIAGNOSIS — R6889 Other general symptoms and signs: Secondary | ICD-10-CM

## 2023-06-09 DIAGNOSIS — M79651 Pain in right thigh: Secondary | ICD-10-CM | POA: Diagnosis not present

## 2023-06-09 DIAGNOSIS — I10 Essential (primary) hypertension: Secondary | ICD-10-CM

## 2023-06-10 ENCOUNTER — Telehealth (HOSPITAL_BASED_OUTPATIENT_CLINIC_OR_DEPARTMENT_OTHER): Payer: Self-pay

## 2023-06-10 DIAGNOSIS — R6889 Other general symptoms and signs: Secondary | ICD-10-CM

## 2023-06-10 DIAGNOSIS — I10 Essential (primary) hypertension: Secondary | ICD-10-CM

## 2023-06-10 NOTE — Telephone Encounter (Addendum)
Results called to patient who verbalizes understanding! Repeat testing ordered, patient okay to discuss further at December visit.   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 Recommend repeat study in 12 months.   If she wishes, could establish with Peripheral Vascular Dr. Allyson Sabal or Dr. Kirke Corin. No need for urgent intervention. We would also simply discuss further at her December appointment.

## 2023-06-21 NOTE — Progress Notes (Addendum)
Cardiology Office Note    Date:  06/23/2023  ID:  Alyssa Jefferson, DOB 09-23-1945, MRN [id number] PCP:  Ellyn Hack, MD  Cardiologist:  Christell Constant, MD -> patient requested provider switch, in transition to establish with Dr. Duke Salvia eventually Electrophysiologist:  None   Chief Complaint: f/u chest pain  History of Present Illness: Alyssa Jefferson is a 77 y.o. female with visit-pertinent history of pericarditis, HTN, CKD 3b followed by Dr. Allena Katz at [facility], OSA with intermittent CPAP use, prior breast CA, HLD, suspected PAD, thyroid disease presents for 1 year follow-up. Remote carotid duplex 2014 showed no hemodynamically significant stenosis; + tortuous RCCA possibly source of bruit.   In 03/2022 she established with Dr. Izora Ribas for chest pain felt due to pericarditis, recommended for ibuprofen x 2 weeks + colchicine 0.6mg  daily for 3 months. In follow-up notes, patient declined to take ibuprofen due to CKD. She was originally concerned about taking colchicine due to hair loss but does report she took it for several months as prescribed. She does not recall that this dramatically/quickly stopped symptoms, but that it rather took a while for them to subside. 2d echo 04/2022 showed EF 60-65%, otherwise unremarkable. In followup 07/2022, symptoms had resolved so colchcine was stopped. Inflammatory markers were still elevated so she was recommended to see PCP to evaluate for other causes of chronic inflammation. Earlier this fall, our office got a call re: outpatient vascular screening test so testing was obtained in our office. 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   She is seen back for follow-up. She reports that in April of this year her chest pain began to come back. At first it was every day but over the subsequent months it subsided to 2-3x/week.  It is described as a focal hurting sensation in her left upper chest sometimes radiating through her left shoulder. It is not able to be provoked by anything, lasts about 30 minutes before resolving spontaneously, has not tried anything to improve the discomfort. She reports it is better when she lies down but worse if she is sitting up and leaning forward. She does not have any associated n/v, diaphoresis, SOB or palpitations. She does report various focal aches in pains including pain in her buttocks, lower back and, groin when she walks, as well as discomfort in her wrists and ankles. She sees orthopedics but denies any prior workup for inflammatory arthritis. No nonhealing LE wounds. She has a basement in her home and has no angina or dyspnea whatsoever with exertion on the stairs.  Labwork independently reviewed: 07/2022 CRP/ESR up, ALT wnl, LDL 98, trig 85 03/2022 Cr 9.56 c/w prior, K 4.5   ROS: .    Please see the history of present illness.  All other systems are reviewed and otherwise negative.  Studies Reviewed: Marland Kitchen    EKG:  EKG is ordered today, personally reviewed, demonstrating sinus bradycardia with sinus arrhythmia, 59bpm, possible LVH, nonspecific STTW changes, similar to prior  CV Studies: Cardiac studies reviewed are outlined and summarized above. Otherwise please see EMR for full report.   Current Reported Medications:.    Current Meds  Medication Sig   albuterol (VENTOLIN HFA) 108 (90 Base) MCG/ACT inhaler Inhale 2 puffs into the lungs every 6 (six) hours as needed for wheezing or shortness of breath.  diclofenac Sodium (VOLTAREN) 1 % GEL Apply 4 g topically 4 (four) times daily as needed.   diltiazem (CARDIZEM CD) 180 MG 24 hr capsule TAKE 1 CAPSULE(180 MG) BY MOUTH DAILY   dorzolamide-timolol (COSOPT) 22.3-6.8 MG/ML ophthalmic solution INSTILL 1 DROP IN RIGHT EYE TWICE DAILY   ketoconazole (NIZORAL) 2 % cream Apply 1 Application topically daily. To affected area till  better   latanoprost (XALATAN) 0.005 % ophthalmic solution Place 1 drop into both eyes at bedtime.   levothyroxine (SYNTHROID) 50 MCG tablet Take 1 tablet (50 mcg total) by mouth daily.   Polyethylene Glycol 3350 (MIRALAX PO) Take 17 g by mouth as needed (constipation).   rosuvastatin (CRESTOR) 20 MG tablet TAKE 1 TABLET(20 MG) BY MOUTH AT BEDTIME   spironolactone (ALDACTONE) 100 MG tablet Take 100 mg by mouth daily. Pt takes 1 tablet daily.   triamcinolone cream (KENALOG) 0.1 % Apply 1 Application topically 2 (two) times daily. Apply to affected areas twice a day until symptoms resolve or up to 2 weeks (Patient taking differently: Apply 1 Application topically as needed.)    Physical Exam:    VS:  BP (!) 140/108   Pulse 69   Ht 5\' 2"  (1.575 m)   Wt 198 lb 12.8 oz (90.2 kg)   SpO2 96%   BMI 36.36 kg/m    Wt Readings from Last 3 Encounters:  06/23/23 198 lb 12.8 oz (90.2 kg)  07/29/22 216 lb (98 kg)  03/29/22 224 lb (101.6 kg)    GEN: Well nourished, well developed in no acute distress NECK: No JVD; No carotid bruits CARDIAC: RRR, no murmurs, rubs, gallops. Diminished pedal pulses L>R. No nonhealing ulcers RESPIRATORY:  Clear to auscultation without rales, wheezing or rhonchi  ABDOMEN: Soft, non-tender, non-distended EXTREMITIES:  No edema; No acute deformity   Asessement and Plan:.    1. Atypical chest pain, history of pericarditis - recurrence of chest discomfort starting 7 months ago with mixed features, not entirely classic for pericarditis, but history of chronically elevated inflammatory markers muddying the water. Interestingly she has relief laying back, and symptoms are not provoked by inspiration or consistent in nature. This may be some sort of MSK/neuropathic process. She also has various myalgias concerning for underlying autoimmune process. Will check CBC, CMET, TSH, CRP, ESR, RF, ANA  and CK today. I will plan to review with Dr. Izora Ribas to discuss next plan of  action. Patient does not wish to consider NSAIDS going forward in setting of CKD. If we do trial restart colchicine will need to be cognizant of statin therapy and renal dosing. The symptoms are not typical of angina. They do not occur in any context of exertion and are positional in nature when occurring.  2. PAD, HLD - mixed features of lower extremity discomfort, some arthritic but cannot exclude aortoiliac claudication. Per our discussion will refer to [facility] MD (Drs. Kirke Corin or Allyson Sabal) for evaluation. Her LDL was not at goal in 07/2022 but per shared decision making will defer anti-lipid dose intensification while undergoing workup as above. At one point she was on Zetia then taken off, unclear details, was not being prescribed by our office so would continue to f/u PCP for lipid management.  3. Essential HTN - initial BP 140/108, recheck by me 138/82. She reports some anxiety about today's visit. We need a better idea of what it's running at home. The patient was provided instructions on monitoring blood pressure at home for 1 week and relaying results to  our office. She remains on spironolactone 100mg  daily (states dosed by nephrology) and diltiazem which is a medicine we inherited her on. She is nervous about any medicine that will affect her kidneys so if she requires dose adjustment for any nephro-agent in the future, would need to get the OK from Dr. Allena Katz.  4. CKD 3b - recheck labs today.    Disposition: F/u with me in 6-8 weeks.  Signed, Laurann Montana, PA-C

## 2023-06-23 ENCOUNTER — Ambulatory Visit: Payer: Medicare Other | Attending: Physician Assistant | Admitting: Physician Assistant

## 2023-06-23 ENCOUNTER — Encounter: Payer: Self-pay | Admitting: Physician Assistant

## 2023-06-23 VITALS — BP 138/82 | HR 69 | Ht 62.0 in | Wt 198.8 lb

## 2023-06-23 DIAGNOSIS — R0789 Other chest pain: Secondary | ICD-10-CM | POA: Diagnosis not present

## 2023-06-23 DIAGNOSIS — I319 Disease of pericardium, unspecified: Secondary | ICD-10-CM | POA: Diagnosis not present

## 2023-06-23 DIAGNOSIS — I739 Peripheral vascular disease, unspecified: Secondary | ICD-10-CM | POA: Diagnosis not present

## 2023-06-23 DIAGNOSIS — N1832 Chronic kidney disease, stage 3b: Secondary | ICD-10-CM

## 2023-06-23 DIAGNOSIS — I10 Essential (primary) hypertension: Secondary | ICD-10-CM

## 2023-06-23 DIAGNOSIS — E785 Hyperlipidemia, unspecified: Secondary | ICD-10-CM | POA: Diagnosis not present

## 2023-06-23 NOTE — Patient Instructions (Addendum)
Medication Instructions:  Your physician recommends that you continue on your current medications as directed. Please refer to the Current Medication list given to you today. *If you need a refill on your cardiac medications before your next appointment, please call your pharmacy*   Lab Work: TODAY-ANA, CBC, CMET, CK, CRP, ESR, RF, TSH If you have labs (blood work) drawn today and your tests are completely normal, you will receive your results only by: MyChart Message (if you have MyChart) OR A paper copy in the mail If you have any lab test that is abnormal or we need to change your treatment, we will call you to review the results.   Testing/Procedures: NONE ORDERED   Follow-Up: At [facility], you and your health needs are our priority.  As part of our continuing mission to provide you with exceptional heart care, we have created designated Provider Care Teams.  These Care Teams include your primary Cardiologist (physician) and Advanced Practice Providers (APPs -  Physician Assistants and Nurse Practitioners) who all work together to provide you with the care you need, when you need it.  We recommend signing up for the patient portal called "MyChart".  Sign up information is provided on this After Visit Summary.  MyChart is used to connect with patients for Virtual Visits (Telemedicine).  Patients are able to view lab/test results, encounter notes, upcoming appointments, etc.  Non-urgent messages can be sent to your provider as well.   To learn more about what you can do with MyChart, go to [email or web address].    Your next appointment:   6-8 week(s)  Provider:   Ronie Spies, PA-C        You have been referred to [facility]   Other Instructions     We need to get a better idea of what your blood pressure is running at home. Here are some instructions to follow: - I would recommend using a blood pressure cuff that goes on your arm. The wrist ones can be inaccurate. If  you're purchasing one for the first time, try to select one that also reports your heart rate because this can be helpful information as well. - To check your blood pressure, choose a time at least 3 hours after taking your blood pressure medicines. If you can sample it at different times of the day, that's great - it might give you more information about how your blood pressure fluctuates. Remain seated in a chair for 5 minutes quietly beforehand, then check it.  - Please record a list of those readings and call us/send in MyChart message with them for our review in 1 week.

## 2023-06-24 ENCOUNTER — Other Ambulatory Visit: Payer: Self-pay

## 2023-06-24 ENCOUNTER — Encounter (HOSPITAL_COMMUNITY): Payer: Self-pay | Admitting: Emergency Medicine

## 2023-06-24 ENCOUNTER — Emergency Department (HOSPITAL_COMMUNITY)
Admission: EM | Admit: 2023-06-24 | Discharge: 2023-06-24 | Disposition: A | Discharge status: Home / Self Care | Payer: Medicare Other | Attending: Emergency Medicine | Admitting: Emergency Medicine

## 2023-06-24 ENCOUNTER — Telehealth: Payer: Self-pay | Admitting: *Deleted

## 2023-06-24 DIAGNOSIS — Z79899 Other long term (current) drug therapy: Secondary | ICD-10-CM | POA: Diagnosis not present

## 2023-06-24 DIAGNOSIS — I159 Secondary hypertension, unspecified: Secondary | ICD-10-CM | POA: Diagnosis not present

## 2023-06-24 DIAGNOSIS — N189 Chronic kidney disease, unspecified: Secondary | ICD-10-CM | POA: Insufficient documentation

## 2023-06-24 LAB — COMPREHENSIVE METABOLIC PANEL
ALT: 13 [IU]/L (ref 0–32)
ALT: 14 U/L (ref 0–44)
AST: 16 [IU]/L (ref 0–40)
AST: 13 U/L — ABNORMAL LOW (ref 15–41)
Albumin: 4.4 g/dL (ref 3.8–4.8)
Albumin: 3.6 g/dL (ref 3.5–5.0)
Alkaline Phosphatase: 94 [IU]/L (ref 44–121)
Alkaline Phosphatase: 65 U/L (ref 38–126)
Anion gap: 8 (ref 5–15)
BUN/Creatinine Ratio: 18 (ref 12–28)
BUN: 39 mg/dL — ABNORMAL HIGH (ref 8–27)
BUN: 39 mg/dL — ABNORMAL HIGH (ref 8–23)
Bilirubin Total: 0.5 mg/dL (ref 0.0–1.2)
CO2: 20 mmol/L (ref 20–29)
CO2: 22 mmol/L (ref 22–32)
Calcium: 10.2 mg/dL (ref 8.7–10.3)
Calcium: 8.9 mg/dL (ref 8.9–10.3)
Chloride: 104 mmol/L (ref 96–106)
Chloride: 106 mmol/L (ref 98–111)
Creatinine, Ser: 2.17 mg/dL — ABNORMAL HIGH (ref 0.57–1.00)
Creatinine, Ser: 2.12 mg/dL — ABNORMAL HIGH (ref 0.44–1.00)
GFR, Estimated: 24 mL/min — ABNORMAL LOW (ref >60)
Globulin, Total: 3.1 g/dL (ref 1.5–4.5)
Glucose, Bld: 93 mg/dL (ref 70–99)
Glucose: 86 mg/dL (ref 70–99)
Potassium: 5.8 mmol/L (ref 3.5–5.2)
Potassium: 4.5 mmol/L (ref 3.5–5.1)
Sodium: 139 mmol/L (ref 134–144)
Sodium: 136 mmol/L (ref 135–145)
Total Bilirubin: 0.8 mg/dL (ref <1.2)
Total Protein: 7.5 g/dL (ref 6.0–8.5)
Total Protein: 7.5 g/dL (ref 6.5–8.1)
eGFR: 23 mL/min/{1.73_m2} — ABNORMAL LOW (ref >59)

## 2023-06-24 LAB — URINALYSIS, ROUTINE W REFLEX MICROSCOPIC
Bilirubin Urine: NEGATIVE
Glucose, UA: NEGATIVE mg/dL
Hgb urine dipstick: NEGATIVE
Ketones, ur: NEGATIVE mg/dL
Leukocytes,Ua: NEGATIVE
Nitrite: NEGATIVE
Protein, ur: NEGATIVE mg/dL
Specific Gravity, Urine: 1.011 (ref 1.005–1.030)
pH: 5 (ref 5.0–8.0)

## 2023-06-24 LAB — CBC
Hematocrit: 37 % (ref 34.0–46.6)
Hemoglobin: 11.5 g/dL (ref 11.1–15.9)
MCH: 26.5 pg — ABNORMAL LOW (ref 26.6–33.0)
MCHC: 31.1 g/dL — ABNORMAL LOW (ref 31.5–35.7)
MCV: 85 fL (ref 79–97)
Platelets: 291 10*3/uL (ref 150–450)
RBC: 4.34 x10E6/uL (ref 3.77–5.28)
RDW: 12.9 % (ref 11.7–15.4)
WBC: 7.5 10*3/uL (ref 3.4–10.8)

## 2023-06-24 LAB — CBC WITH DIFFERENTIAL/PLATELET
Abs Immature Granulocytes: 0.03 10*3/uL (ref 0.00–0.07)
Basophils Absolute: 0 10*3/uL (ref 0.0–0.1)
Basophils Relative: 1 %
Eosinophils Absolute: 0.2 10*3/uL (ref 0.0–0.5)
Eosinophils Relative: 4 %
HCT: 34.7 % — ABNORMAL LOW (ref 36.0–46.0)
Hemoglobin: 10.7 g/dL — ABNORMAL LOW (ref 12.0–15.0)
Immature Granulocytes: 1 %
Lymphocytes Relative: 24 %
Lymphs Abs: 1.5 10*3/uL (ref 0.7–4.0)
MCH: 26.4 pg (ref 26.0–34.0)
MCHC: 30.8 g/dL (ref 30.0–36.0)
MCV: 85.7 fL (ref 80.0–100.0)
Monocytes Absolute: 0.6 10*3/uL (ref 0.1–1.0)
Monocytes Relative: 9 %
Neutro Abs: 3.9 10*3/uL (ref 1.7–7.7)
Neutrophils Relative %: 61 %
Platelets: 254 10*3/uL (ref 150–400)
RBC: 4.05 MIL/uL (ref 3.87–5.11)
RDW: 13.3 % (ref 11.5–15.5)
WBC: 6.2 10*3/uL (ref 4.0–10.5)
nRBC: 0 % (ref 0.0–0.2)

## 2023-06-24 LAB — TSH: TSH: 1.22 u[IU]/mL (ref 0.450–4.500)

## 2023-06-24 LAB — ANA: Anti Nuclear Antibody (ANA): NEGATIVE

## 2023-06-24 LAB — CK: Total CK: 77 U/L (ref 32–182)

## 2023-06-24 LAB — RHEUMATOID FACTOR: Rheumatoid fact SerPl-aCnc: 12.4 [IU]/mL (ref <14.0)

## 2023-06-24 LAB — SEDIMENTATION RATE: Sed Rate: 40 mm/h (ref 0–40)

## 2023-06-24 LAB — C-REACTIVE PROTEIN: CRP: 23 mg/L — ABNORMAL HIGH (ref 0–10)

## 2023-06-24 MED ORDER — AMLODIPINE BESYLATE 5 MG PO TABS: 5.0000 mg | ORAL_TABLET | Freq: Every day | ORAL | 1 refills | Status: DC

## 2023-06-24 NOTE — Telephone Encounter (Signed)
-----   Message from Laurann Montana sent at 06/24/2023  7:51 AM EST ----- Please let pt know that her potassium is very high and kidney function looks acutely worse from previous. Given the new worsening kidney function I am inclined to think the potassium is a real value. Would have her stop spironolactone altogether for now and present to ED for re-evaluation and recheck. May require medication to bring potassium down. Sed rate is negative, CRP is slightly elevated but similar to prior trends - not entirely convinced her atypical chest pain represents pericarditis. I will await remainder of labs and ED course before advising further testing.

## 2023-06-24 NOTE — Discharge Instructions (Addendum)
You will have follow-up with cardiology on November 12.  They should call you to confirm appointment but appointment is written below.  This will be at the [facility].  Gillian Shields, NP 11/12 @ 2:45 pm ([facility])

## 2023-06-24 NOTE — ED Provider Notes (Signed)
[place] EMERGENCY DEPARTMENT AT [facility] Provider Note   CSN: [id number] Arrival date & time: 06/24/23  0857     History  Chief Complaint  Patient presents with   abnormal labs   Back Pain    Alyssa Jefferson is a 77 y.o. female.  Patient is here for lab recheck.  She was seen yesterday at cardiologist office and had elevated potassium.  Kidney function was up a little bit as well.  She has no symptoms.  She has not been any chest pain shortness of breath weakness numbness tingling.  She has a history of CKD and hypertension.  She is ready been instructed to stop her spironolactone.  She had a history of pericarditis.  She been having some chest discomfort the last few months but none currently.  She had lab work done yesterday to evaluate.  Was sent here due to high potassium.  The history is provided by the patient.       Home Medications Prior to Admission medications   Medication Sig Start Date End Date Taking? Authorizing Provider  acetaminophen-codeine (TYLENOL #3) 300-30 MG tablet Take 1 tablet by mouth every 12 (twelve) hours as needed for moderate pain (pain score 4-6) or severe pain (pain score 7-10). 03/30/23  Yes [provider]  albuterol (VENTOLIN HFA) 108 (90 Base) MCG/ACT inhaler Inhale 2 puffs into the lungs every 6 (six) hours as needed for wheezing or shortness of breath. 01/01/19  Yes Stallings, Zoe A, MD  amLODipine (NORVASC) 5 MG tablet Take 1 tablet (5 mg total) by mouth daily. 06/24/23 07/24/23 Yes Dimas Scheck, DO  diclofenac Sodium (VOLTAREN) 1 % GEL Apply 4 g topically 4 (four) times daily as needed. 08/20/21  Yes Tilden Fossa, MD  dorzolamide-timolol (COSOPT) 22.3-6.8 MG/ML ophthalmic solution INSTILL 1 DROP IN RIGHT EYE TWICE DAILY 11/05/20  Yes [provider]  latanoprost (XALATAN) 0.005 % ophthalmic solution Place 1 drop into both eyes at bedtime. 01/01/19  Yes Doristine Bosworth, MD  levothyroxine (SYNTHROID) 50 MCG tablet  Take 1 tablet (50 mcg total) by mouth daily. 10/17/19  Yes Stallings, Zoe A, MD  omeprazole (PRILOSEC) 40 MG capsule Take 40 mg by mouth daily. 06/17/23  Yes [provider]  Polyethylene Glycol 3350 (MIRALAX PO) Take 17 g by mouth as needed (constipation).   Yes [provider]  rosuvastatin (CRESTOR) 40 MG tablet Take 40 mg by mouth daily. 06/17/23  Yes [provider]  triamcinolone cream (KENALOG) 0.1 % Apply 1 Application topically 2 (two) times daily. Apply to affected areas twice a day until symptoms resolve or up to 2 weeks Patient taking differently: Apply 1 Application topically as needed. 01/03/23  Yes Lurline Idol, FNP  ketoconazole (NIZORAL) 2 % cream Apply 1 Application topically daily. To affected area till better Patient not taking: Reported on 06/24/2023 01/10/23   Zenia Resides, MD      Allergies    Atorvastatin calcium, Codeine, and Lipitor [atorvastatin calcium]    Review of Systems   Review of Systems  Physical Exam Updated Vital Signs BP (!) 148/81   Pulse (!) 58   Temp (!) 97.5 F (36.4 C) (Oral)   Resp 16   SpO2 100%  Physical Exam Vitals and nursing note reviewed.  Constitutional:      General: She is not in acute distress.    Appearance: She is well-developed. She is not ill-appearing.  HENT:     Head: Normocephalic and atraumatic.  Nose: Nose normal.     Mouth/Throat:     Mouth: Mucous membranes are moist.  Eyes:     Extraocular Movements: Extraocular movements intact.     Conjunctiva/sclera: Conjunctivae normal.     Pupils: Pupils are equal, round, and reactive to light.  Cardiovascular:     Rate and Rhythm: Normal rate and regular rhythm.     Pulses: Normal pulses.     Heart sounds: Normal heart sounds. No murmur heard. Pulmonary:     Effort: Pulmonary effort is normal. No respiratory distress.     Breath sounds: Normal breath sounds.  Abdominal:     Palpations: Abdomen is soft.     Tenderness: There is no  abdominal tenderness.  Musculoskeletal:        General: No swelling.     Cervical back: Normal range of motion and neck supple.  Skin:    General: Skin is warm and dry.     Capillary Refill: Capillary refill takes less than 2 seconds.  Neurological:     General: No focal deficit present.     Mental Status: She is alert.  Psychiatric:        Mood and Affect: Mood normal.     ED Results / Procedures / Treatments   Labs (all labs ordered are listed, but only abnormal results are displayed) Labs Reviewed  CBC WITH DIFFERENTIAL/PLATELET - Abnormal; Notable for the following components:      Result Value   Hemoglobin 10.7 (*)    HCT 34.7 (*)    All other components within normal limits  COMPREHENSIVE METABOLIC PANEL - Abnormal; Notable for the following components:   BUN 39 (*)    Creatinine, Ser 2.12 (*)    AST 13 (*)    GFR, Estimated 24 (*)    All other components within normal limits  URINALYSIS, ROUTINE W REFLEX MICROSCOPIC    EKG EKG Interpretation Date/Time:  Friday June 24 2023 13:58:47 EST Ventricular Rate:  69 PR Interval:  166 QRS Duration:  113 QT Interval:  394 QTC Calculation: 423 R Axis:   24  Text Interpretation: Sinus rhythm Incomplete left bundle branch block Confirmed by Virgina Norfolk [phone]) on 06/24/2023 2:03:14 PM  Radiology No results found.  Procedures Procedures    Medications Ordered in ED Medications - No data to display  ED Course/ Medical Decision Making/ A&P                                 Medical Decision Making Amount and/or Complexity of Data Reviewed Labs: ordered.  Risk Prescription drug management.   Alyssa Jefferson is here for abnormal labs.  Normal vitals.  No fever.  EKG shows sinus rhythm.  No ischemic changes.  History of pericarditis, high blood pressure.  She was seen yesterday at cardiology clinic had a potassium of 5.8.  Creatinine was up to 2.17.  Her baseline is around 1.5.  She was instructed to come here  and labs were rechecked.  Potassium is 4.5.  Creatinine is 2.12.  This is slightly above her baseline but not too concerning.  She is asymptomatic.  Is not having any chest pain.  EKG is reassuring.  Lab work otherwise is unremarkable.  She has stopped spironolactone.  She is not on any other medications that would affect her kidney function or electrolytes.  I talked with Dr. Duke Salvia with cardiology.  Overall we do not think there  any further cardiac workup is necessary.  Her lab work otherwise yesterday was reassuring.  Dr. Duke Salvia recommends stopping her diltiazem and switching her over to amlodipine for better blood pressure control.  They will follow-up with her on November 12 for recheck of her labs and blood pressure recheck.  Patient is okay with this plan.  She was discharged in the ED in good condition.  Understands return precautions.  This chart was dictated using voice recognition software.  Despite best efforts to proofread,  errors can occur which can change the documentation meaning.         Final Clinical Impression(s) / ED Diagnoses Final diagnoses:  Chronic kidney disease, unspecified CKD stage  Secondary hypertension    Rx / DC Orders ED Discharge Orders          Ordered    amLODipine (NORVASC) 5 MG tablet  Daily        06/24/23 1455              Lorea Kupfer, Madelaine Bhat, DO 06/24/23 1457

## 2023-06-24 NOTE — ED Triage Notes (Signed)
Pt. Stated, I was called by my Dr. And said to come here , My lab was not normal. I always have lower back pain

## 2023-06-24 NOTE — Progress Notes (Signed)
Called by ED regarding the patient referred from clinic for hyperkalemia.  Potassium has normalized.  Spironolactone has been appropriately held.  BP is uncontrolled.  Recommend stopping diltiazem.  Start amlodipine 5mg  instead.  Track BP at home and bring to f/u in 1 week.  Will repeat BMP at that time.  Renal function remains above baseline but trending in the right direction.  No indication for inpatient hospitalization.  We will arrange follow up.  Loney Peto C. Duke Salvia, MD, [facility] 06/24/2023 2:07 PM

## 2023-06-28 ENCOUNTER — Telehealth (HOSPITAL_BASED_OUTPATIENT_CLINIC_OR_DEPARTMENT_OTHER): Payer: Self-pay

## 2023-06-28 ENCOUNTER — Encounter (HOSPITAL_BASED_OUTPATIENT_CLINIC_OR_DEPARTMENT_OTHER): Payer: Self-pay | Admitting: Family

## 2023-06-28 ENCOUNTER — Ambulatory Visit (HOSPITAL_BASED_OUTPATIENT_CLINIC_OR_DEPARTMENT_OTHER): Payer: Medicare Other | Admitting: Family

## 2023-06-28 VITALS — BP 140/82 | HR 64 | Ht 62.0 in | Wt 200.6 lb

## 2023-06-28 DIAGNOSIS — E785 Hyperlipidemia, unspecified: Secondary | ICD-10-CM

## 2023-06-28 DIAGNOSIS — I10 Essential (primary) hypertension: Secondary | ICD-10-CM | POA: Diagnosis not present

## 2023-06-28 DIAGNOSIS — I319 Disease of pericardium, unspecified: Secondary | ICD-10-CM

## 2023-06-28 DIAGNOSIS — I739 Peripheral vascular disease, unspecified: Secondary | ICD-10-CM | POA: Diagnosis not present

## 2023-06-28 NOTE — Progress Notes (Unsigned)
Cardiology Office Note:  .   Date:  06/30/2023  ID:  Alyssa Jefferson, DOB [birth year], MRN [id number] PCP: Ellyn Hack, MD  Nephrology: Dr. Val[facility] Providers Cardiologist:  Chilton Si, MD    History of Present Illness: Alyssa Jefferson is a 77 y.o. female with history of pericarditis, HTN, CKD3b, OSA with intermittent CPAP use, prior breast CA, HLD, suspected PAD, thyroid disease.  Evaluated by Dr. Raynelle Jan August 2023 with chest pain felt to be pericarditis.  Treated with ibuprofen for Teebacin colchicine 0.6 mg daily for 3 months.  In follow-up meds, declined today ibuprofen due to CKD.  2D echo 04/2022 LVEF suggested 5%, otherwise unremarkable.  At follow-up 09/2021 symptoms had resolved and colchicine discontinued.  Inflammatory markers still elevated so she was recommended follow-up with PCP for further evaluation of chronic inflammation.  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 She was referred to peripheral vascular team.   Seen by Ronie Spies, PA 06/23/23. Notes in April chest pain started to recur but subsided to 2-3x per week in left chest and shoulder and resolved spontaneously. Not felt to be cardiac nor pericarditis. TSH, CK, ANA, rheumatoid factor were unremarkable. CRP mildly elevated at 23, but similar to previous.  Due to creatinine 2.17, K5.8 with baseline creatinine 1.5 she was recommended for evaluation in the ED.  Spironolactone was discontinued.  At Dr. Duke Salvia direction diltiazem was transition to amlodipine for better blood pressure control.  Presents today for follow up. Chest pain less frequent than previous but still with occasional midsternal chest discomfort worse with leaning forward and better when reclined. Reviewed reassuring recent lab work. We discussed possible MRI for  further evaluation. She shares with me that she did not tolerate a previous MRI even with pre-medication. Notes lower back pain that readiates down her left side. Discussed likely etiology sciatica and educated on stretching exercises. BP at home 130-140s.   ROS: Please see the history of present illness.    All other systems reviewed and are negative.   Studies Reviewed: .        Cardiac Studies & Procedures       ECHOCARDIOGRAM  ECHOCARDIOGRAM COMPLETE 04/20/2022  Narrative ECHOCARDIOGRAM REPORT    Patient Name:   Alyssa Jefferson Date of Exam: 04/20/2022 Medical Rec #:  [id number]      Height:       62.0 in Accession #:    [id number]     Weight:       224.0 lb Date of Birth:  December 11, 1945      BSA:          2.006 m Patient Age:    76 years       BP:           176/88 mmHg Patient Gender: F              HR:           58 bpm. Exam Location:  [address]  Procedure: 2D Echo, Cardiac Doppler and Color Doppler  Indications:    R07.2 Precordial pain  History:        Patient has no prior history of Echocardiogram examinations. Breast cancer, Signs/Symptoms:Murmur; Risk Factors:Hypertension, Sleep Apnea and CKD.  Sonographer:    Clearence Ped RCS Referring Phys: [id number] [facility] A Izora Ribas  IMPRESSIONS   1. Left ventricular ejection fraction, by estimation, is 60 to 65%. The left ventricle has normal function. The left ventricle has no regional wall motion abnormalities. Left ventricular diastolic parameters were normal. 2. Right ventricular systolic function is normal. The right ventricular size is normal. Tricuspid regurgitation signal is inadequate for assessing PA pressure. 3. The mitral valve is grossly normal. Trivial mitral valve regurgitation. No evidence of mitral stenosis. 4. The aortic valve is tricuspid. Aortic valve regurgitation is not visualized. No aortic stenosis is present. 5. The inferior vena cava is normal in size with greater than 50% respiratory variability,  suggesting right atrial pressure of 3 mmHg.  Conclusion(s)/Recommendation(s): Normal biventricular function without evidence of hemodynamically significant valvular heart disease.  FINDINGS Left Ventricle: Left ventricular ejection fraction, by estimation, is 60 to 65%. The left ventricle has normal function. The left ventricle has no regional wall motion abnormalities. Global longitudinal strain performed but not reported based on interpreter judgement due to suboptimal tracking. The left ventricular internal cavity size was normal in size. There is no left ventricular hypertrophy. Left ventricular diastolic parameters were normal.  Right Ventricle: The right ventricular size is normal. No increase in right ventricular wall thickness. Right ventricular systolic function is normal. Tricuspid regurgitation signal is inadequate for assessing PA pressure.  Left Atrium: Left atrial size was normal in size.  Right Atrium: Right atrial size was normal in size.  Pericardium: There is no evidence of pericardial effusion.  Mitral Valve: The mitral valve is grossly normal. Trivial mitral valve regurgitation. No evidence of mitral valve stenosis.  Tricuspid Valve: The tricuspid valve is grossly normal. Tricuspid valve regurgitation is trivial. No evidence of tricuspid stenosis.  Aortic Valve: The aortic valve is tricuspid. Aortic valve regurgitation is not visualized. No aortic stenosis is present.  Pulmonic Valve: The pulmonic valve was grossly normal. Pulmonic valve regurgitation is not visualized. No evidence of pulmonic stenosis.  Aorta: The aortic root and ascending aorta are structurally normal, with no evidence of dilitation.  Venous: The inferior vena cava is normal in size with greater than 50% respiratory variability, suggesting right atrial pressure of 3 mmHg.  IAS/Shunts: The atrial septum is grossly normal.   LEFT VENTRICLE PLAX 2D LVIDd:         5.10 cm   Diastology LVIDs:          3.40 cm   LV e' medial:    7.40 cm/s LV PW:         1.00 cm   LV E/e' medial:  11.6 LV IVS:        0.80 cm   LV e' lateral:   12.30 cm/s LVOT diam:     2.00 cm   LV E/e' lateral: 7.0 LV SV:         78 LV SV Index:   39        2D Longitudinal Strain LVOT Area:     3.14 cm  2D Strain GLS (A2C):   -22.0 % 2D Strain GLS (A3C):   -18.8 % 2D Strain GLS (A4C):   -23.6 % 2D Strain GLS Avg:     -21.4 %  RIGHT VENTRICLE RV Basal diam:  3.50 cm RV S prime:     13.40 cm/s TAPSE (M-mode): 2.2 cm  LEFT ATRIUM             Index        RIGHT ATRIUM           Index LA  diam:        3.70 cm 1.84 cm/m   RA Area:     13.60 cm LA Vol (A2C):   77.9 ml 38.83 ml/m  RA Volume:   33.80 ml  16.85 ml/m LA Vol (A4C):   44.4 ml 22.13 ml/m LA Biplane Vol: 59.6 ml 29.71 ml/m AORTIC VALVE LVOT Vmax:   82.20 cm/s LVOT Vmean:  59.100 cm/s LVOT VTI:    0.249 m  AORTA Ao Root diam: 2.90 cm  MITRAL VALVE MV Area (PHT):              SHUNTS MV Decel Time:              Systemic VTI:  0.25 m MV E velocity: 85.90 cm/s   Systemic Diam: 2.00 cm MV A velocity: 114.00 cm/s MV E/A ratio:  0.75  Lennie Odor MD Electronically signed by Lennie Odor MD Signature Date/Time: 04/20/2022/12:09:50 PM    Final             Risk Assessment/Calculations:            Physical Exam:   VS:  BP (!) 140/82   Pulse 64   Ht 5\' 2"  (1.575 m)   Wt 200 lb 9.6 oz (91 kg)   SpO2 98%   BMI 36.69 kg/m    Wt Readings from Last 3 Encounters:  06/28/23 200 lb 9.6 oz (91 kg)  06/23/23 198 lb 12.8 oz (90.2 kg)  07/29/22 216 lb (98 kg)    GEN: Well nourished, well developed in no acute distress NECK: No JVD; No carotid bruits CARDIAC: RRR, no murmurs, rubs, gallops RESPIRATORY:  Clear to auscultation without rales, wheezing or rhonchi  ABDOMEN: Soft, non-tender, non-distended EXTREMITIES:  No edema; No deformity   ASSESSMENT AND PLAN: .    Atypical chest pain / Hx of pericarditis - Prior pericarditis 03/2022 at which  time ibuprofen not utilized due to CKD. Completed 3 mos of Colchicine. CRP has remained mildly elevated recommended for follow up with PCP. More recent recurrent of chest pain with minimally elevated CRP but normal ANA, sed rate, rheum factor. Symptoms overall atypical for pericarditis as improved with reclining. Discussed possible cMRI for further evaluation but previously did not tolerate MRI even with pre-medication. Will plan for echocardiogram for further evaluation.  PAD / HLD, LDL goal <70 - R ABI abnormal 04/2023 with subsequent aAortoiliac duplex suggested >50% R CIA, R EIA, L EIA stenosis. Leg pain today radiates from back down left leg consistent with sciatica, stretching encouraged. Has been referred to peripheral vascular at previous visit given abnormal aortoiliac duplex.  Continue Rosuvastatin 40mg  daily.   Hyperkalemia - Resolved after discontinuation of Spironolactone. Update BMP to ensure stability.   HTN - BP not at goal <130/80 in clinic but has been on Amlodipine 5mg  daily less than 1 week. Phone call in 1 week to check in on BP. If not routinely at goal <130/80, plan to increase Amlodipine to 10mg  daily at that time. Discussed to monitor BP at home at least 2 hours after medications and sitting for 5-10 minutes.   [id number] - Careful titration of diuretic and antihypertensive.  Update BMP for monitoring.        Dispo: follow up in 3-4 weeks  Signed, Alver Sorrow, NP

## 2023-06-28 NOTE — Patient Instructions (Addendum)
Medication Instructions:  Your physician recommends that you continue on your current medications as directed. Please refer to the Current Medication list given to you today.  We will call you in about 1 week to follow up on your blood pressures!  *If you need a refill on your cardiac medications before your next appointment, please call your pharmacy*  Testing/Procedures: Your physician has requested that you have an echocardiogram. Echocardiography is a painless test that uses sound waves to create images of your heart. It provides your doctor with information about the size and shape of your heart and how well your heart's chambers and valves are working. This procedure takes approximately one hour. There are no restrictions for this procedure. Please do NOT wear cologne, perfume, aftershave, or lotions (deodorant is allowed). Please arrive 15 minutes prior to your appointment time.  Please note: We ask at that you not bring children with you during ultrasound (echo/ vascular) testing. Due to room size and safety concerns, children are not allowed in the ultrasound rooms during exams. Our front office staff cannot provide observation of children in our lobby area while testing is being conducted. An adult accompanying a patient to their appointment will only be allowed in the ultrasound room at the discretion of the ultrasound technician under special circumstances. We apologize for any inconvenience.   Follow-Up: At [facility], you and your health needs are our priority.  As part of our continuing mission to provide you with exceptional heart care, we have created designated Provider Care Teams.  These Care Teams include your primary Cardiologist (physician) and Advanced Practice Providers (APPs -  Physician Assistants and Nurse Practitioners) who all work together to provide you with the care you need, when you need it.  We recommend signing up for the patient portal called  "MyChart".  Sign up information is provided on this After Visit Summary.  MyChart is used to connect with patients for Virtual Visits (Telemedicine).  Patients are able to view lab/test results, encounter notes, upcoming appointments, etc.  Non-urgent messages can be sent to your provider as well.   To learn more about what you can do with MyChart, go to [email or web address].    Your next appointment:   July 19, 2023 @ 10:00 AM  Provider:   Chilton Si, MD

## 2023-06-28 NOTE — Telephone Encounter (Signed)
Called patient and informed her that she will need labs sometime this week; patient verbalized understanding and will get them done on Thursday.

## 2023-06-30 ENCOUNTER — Encounter (HOSPITAL_BASED_OUTPATIENT_CLINIC_OR_DEPARTMENT_OTHER): Payer: Self-pay | Admitting: Family

## 2023-07-01 ENCOUNTER — Ambulatory Visit (HOSPITAL_BASED_OUTPATIENT_CLINIC_OR_DEPARTMENT_OTHER): Payer: Medicare Other

## 2023-07-01 ENCOUNTER — Telehealth (HOSPITAL_BASED_OUTPATIENT_CLINIC_OR_DEPARTMENT_OTHER): Payer: Self-pay

## 2023-07-01 DIAGNOSIS — I319 Disease of pericardium, unspecified: Secondary | ICD-10-CM | POA: Diagnosis not present

## 2023-07-01 LAB — ECHOCARDIOGRAM COMPLETE
Area-P 1/2: 3.08 cm2
S' Lateral: 2.06 cm

## 2023-07-01 NOTE — Telephone Encounter (Signed)
Discussed results with patients, she verbalized understanding and was thankful for call.   ----- Message from Alver Sorrow sent at 07/01/2023  4:45 PM EST ----- Echocardiogram with normal heart pumping function. No fluid around the heart. No significant valvular abnormalities. This is a good result! Recommend follow up as scheduled.

## 2023-07-01 NOTE — Telephone Encounter (Signed)
-----   Message from Alver Sorrow sent at 07/01/2023  4:45 PM EST ----- Echocardiogram with normal heart pumping function. No fluid around the heart. No significant valvular abnormalities. This is a good result! Recommend follow up as scheduled.

## 2023-07-12 ENCOUNTER — Other Ambulatory Visit (HOSPITAL_BASED_OUTPATIENT_CLINIC_OR_DEPARTMENT_OTHER): Payer: Medicare Other

## 2023-07-19 ENCOUNTER — Ambulatory Visit (HOSPITAL_BASED_OUTPATIENT_CLINIC_OR_DEPARTMENT_OTHER): Payer: Medicare Other | Admitting: Cardiovascular Disease

## 2023-07-19 ENCOUNTER — Encounter (HOSPITAL_BASED_OUTPATIENT_CLINIC_OR_DEPARTMENT_OTHER): Payer: Self-pay | Admitting: Cardiovascular Disease

## 2023-07-19 VITALS — BP 142/70 | HR 68 | Ht 62.0 in | Wt 203.1 lb

## 2023-07-19 DIAGNOSIS — I10 Essential (primary) hypertension: Secondary | ICD-10-CM

## 2023-07-19 DIAGNOSIS — Z6841 Body Mass Index (BMI) 40.0 and over, adult: Secondary | ICD-10-CM | POA: Diagnosis not present

## 2023-07-19 DIAGNOSIS — G4733 Obstructive sleep apnea (adult) (pediatric): Secondary | ICD-10-CM

## 2023-07-19 DIAGNOSIS — E782 Mixed hyperlipidemia: Secondary | ICD-10-CM

## 2023-07-19 DIAGNOSIS — N183 Chronic kidney disease, stage 3 unspecified: Secondary | ICD-10-CM | POA: Diagnosis not present

## 2023-07-19 LAB — BASIC METABOLIC PANEL
BUN/Creatinine Ratio: 13 (ref 12–28)
BUN: 18 mg/dL (ref 8–27)
CO2: 23 mmol/L (ref 20–29)
Calcium: 9.5 mg/dL (ref 8.7–10.3)
Chloride: 105 mmol/L (ref 96–106)
Creatinine, Ser: 1.42 mg/dL — ABNORMAL HIGH (ref 0.57–1.00)
Glucose: 90 mg/dL (ref 70–99)
Potassium: 3.9 mmol/L (ref 3.5–5.2)
Sodium: 143 mmol/L (ref 134–144)
eGFR: 38 mL/min/{1.73_m2} — ABNORMAL LOW (ref >59)

## 2023-07-19 NOTE — Patient Instructions (Signed)
Medication Instructions:  Your physician recommends that you continue on your current medications as directed. Please refer to the Current Medication list given to you today.   *If you need a refill on your cardiac medications before your next appointment, please call your pharmacy*  Lab Work: BMET TODAY   If you have labs (blood work) drawn today and your tests are completely normal, you will receive your results only by: MyChart Message (if you have MyChart) OR A paper copy in the mail If you have any lab test that is abnormal or we need to change your treatment, we will call you to review the results.  Testing/Procedures: NONE   Follow-Up: At [facility], you and your health needs are our priority.  As part of our continuing mission to provide you with exceptional heart care, we have created designated Provider Care Teams.  These Care Teams include your primary Cardiologist (physician) and Advanced Practice Providers (APPs -  Physician Assistants and Nurse Practitioners) who all work together to provide you with the care you need, when you need it.  We recommend signing up for the patient portal called "MyChart".  Sign up information is provided on this After Visit Summary.  MyChart is used to connect with patients for Virtual Visits (Telemedicine).  Patients are able to view lab/test results, encounter notes, upcoming appointments, etc.  Non-urgent messages can be sent to your provider as well.   To learn more about what you can do with MyChart, go to [email or web address].    Your next appointment:   6 month(s)  Provider:   Chilton Si, MD or Gillian Shields, NP    1 TO 2 MONTHS WITH PHARM D  Other Instructions MONITOR YOUR BLOOD PRESSURE BRING YOUR READINGS AND MACHINE TO YOUR FOLLOW UP APPOINTMENT

## 2023-07-19 NOTE — Progress Notes (Signed)
Cardiology Office Note:  .   Date:  07/19/2023  ID:  Alyssa Jefferson, DOB 06/20/1946, MRN [id number] PCP: Ellyn Hack, MD  [facility] Providers Cardiologist:  Chilton Si, MD    History of Present Illness: Alyssa Jefferson is a 77 y.o. female with hypertension, CKD 3A, pericarditis, OSA intermittently on CPAP, CKD 3 AA, hyperlipidemia, and prior breast cancer here for follow-up.  She saw Dr. Lanier Prude 03/2022 with chest pain.  She was noted to have a murmur on exam and symptoms were pleuritic.  Echo revealed LVEF 60-65% with normal diastolic function.  There is no pericardial effusion.  A chest RP was 35.1.  She was treated with ibuprofen and colchicine.  However she did not take the ibuprofen due to concerns for chronic kidney disease.  She followed up with Dr. Lanier Prude 07/2022 and was doing well.  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  She saw Lucile Crater, NP 06/2023  Alyssa Jefferson presents with severe sciatic pain that has been ongoing for approximately two to three weeks. The pain originates in the back, radiates to the buttocks, and extends down the L leg. The pain is particularly severe at night, causing significant discomfort in the back of the knee and across the buttocks. The patient sought care from an orthopedic doctor who recommended exercises, but she reports being unable to perform them due to the intensity of the pain. She attempted to manage the pain with Tylenol but found no relief.  She is unable to take NSAIDs due to CKD.  In addition to the sciatic pain, the patient reports recent swelling in the ankles, which is worse in the evenings but improves by morning. The patient has been on amlodipine for hypertension for about a month, which was started in the hospital.  Spironolactone was discontinued due to AKI and hyperkalemia.  She also reports a recent intentional weight loss of about 14  pounds by limiting portion sizes.  Lately her blood pressure has been running around 130-140 at home, but she notes it may be higher due to the current pain. The patient denies any recent chest pain or pressure, and reports that the previously diagnosed pericarditis has been better lately. The patient also denies any current issues with breathing.     ROS:  As per HPI  Studies Reviewed: .       Echo 04/2022:  1. Left ventricular ejection fraction, by estimation, is 60 to 65%. The  left ventricle has normal function. The left ventricle has no regional  wall motion abnormalities. Left ventricular diastolic parameters were  normal.   2. Right ventricular systolic function is normal. The right ventricular  size is normal. Tricuspid regurgitation signal is inadequate for assessing  PA pressure.   3. The mitral valve is grossly normal. Trivial mitral valve  regurgitation. No evidence of mitral stenosis.   4. The aortic valve is tricuspid. Aortic valve regurgitation is not  visualized. No aortic stenosis is present.   5. The inferior vena cava is normal in size with greater than 50%  respiratory variability, suggesting right atrial pressure of 3 mmHg.    Risk Assessment/Calculations:     HYPERTENSION CONTROL Vitals:   07/19/23 1035 07/19/23 1057  BP: (!) 144/74 (!) 142/70    The patient's blood pressure is elevated above target today.  In order to address the patient's elevated BP: A new medication  was prescribed today.          Physical Exam:   VS:  BP (!) 142/70   Pulse 68   Ht 5\' 2"  (1.575 m)   Wt 203 lb 1.6 oz (92.1 kg)   BMI 37.15 kg/m  , BMI Body mass index is 37.15 kg/m. GENERAL:  Well appearing HEENT: Pupils equal round and reactive, fundi not visualized, oral mucosa unremarkable NECK:  No jugular venous distention, waveform within normal limits, carotid upstroke brisk and symmetric, no bruits, no thyromegaly LUNGS:  Clear to auscultation bilaterally HEART:  RRR.   PMI not displaced or sustained,S1 and S2 within normal limits, no S3, no S4, no clicks, no rubs, no murmurs ABD:  Flat, positive bowel sounds normal in frequency in pitch, no bruits, no rebound, no guarding, no midline pulsatile mass, no hepatomegaly, no splenomegaly EXT:  2 plus pulses throughout, no edema, no cyanosis no clubbing SKIN:  No rashes no nodules NEURO:  Cranial nerves II through XII grossly intact, motor grossly intact throughout PSYCH:  Cognitively intact, oriented to person place and time  ASSESSMENT AND PLAN: .    # Sciatica Previous recommendation of exercise and physical therapy was not effective due to pain severity. -Refer to orthopedic specialists in the same facility for urgent same-day appointment. -Discuss potential for medication or injections for pain relief.  # Hypertension: # CKD 3b:  Blood pressure slightly elevated compared to home readings, possibly due to pain. Currently on amlodipine. -Check kidney function (basic metabolic panel) to determine next steps for blood pressure management.  She has CKD 3b at baseline.  Recently worse at CKD 4.  Will reassess BMP to see where it is after stopping spironolactone  -Consider switching to an angiotensin receptor blocker (ARB) such as olmesartan or valsartan if kidney function is normal. - will try to stop amlodipine given her LE edema  # Peripheral Edema Recent onset of ankle swelling, possibly related to amlodipine use. -Plan to wean off amlodipine if ARB is initiated for blood pressure control.  General Health Maintenance -Track blood pressure at home and return in 1-2 months for follow-up with pharmacist. -Repeat labs in 1 week after potential initiation of ARB. -Plan for fasting cholesterol check at next lab visit.       Signed, Chilton Si, MD

## 2023-07-20 ENCOUNTER — Ambulatory Visit (HOSPITAL_BASED_OUTPATIENT_CLINIC_OR_DEPARTMENT_OTHER): Payer: Medicare Other | Admitting: Student

## 2023-07-20 ENCOUNTER — Other Ambulatory Visit (INDEPENDENT_AMBULATORY_CARE_PROVIDER_SITE_OTHER): Payer: Medicare Other

## 2023-07-20 ENCOUNTER — Encounter: Payer: Self-pay | Admitting: Physical Medicine and Rehabilitation

## 2023-07-20 ENCOUNTER — Ambulatory Visit: Payer: Medicare Other | Admitting: Physical Medicine and Rehabilitation

## 2023-07-20 DIAGNOSIS — M5442 Lumbago with sciatica, left side: Secondary | ICD-10-CM

## 2023-07-20 DIAGNOSIS — M5416 Radiculopathy, lumbar region: Secondary | ICD-10-CM

## 2023-07-20 DIAGNOSIS — G8929 Other chronic pain: Secondary | ICD-10-CM

## 2023-07-20 MED ORDER — TRAMADOL HCL 50 MG PO TABS: 50.0000 mg | ORAL_TABLET | Freq: Three times a day (TID) | ORAL | 0 refills | Status: DC | PRN

## 2023-07-20 NOTE — Progress Notes (Signed)
Lower back pain down her L leg into the back of her knee.  Persistent for 2 weeks now.  No injury.  Tylenol with no help due to kidneys she can only take certain meds.  She wakes up at 230 can't sleep due to a numbness / tingling in her leg she stated it was weak and she isnt able to move it like normal.

## 2023-07-20 NOTE — Progress Notes (Signed)
Alyssa Jefferson - 77 y.o. female MRN [id number]  Date of birth: Jan 04, 1946  Office Visit Note: Visit Date: 07/20/2023 PCP: Ellyn Hack, MD Referred by: Ellyn Hack, MD  Subjective: Chief Complaint  Patient presents with   Lower Back - Pain   HPI: Alyssa Jefferson is a 77 y.o. female who comes in today for evaluation of chronic, worsening and severe left sided lower back pain radiating to buttock and down lateral leg to ankle. Also reports pain to inner part of left thigh. She reports chronic lower back issues in the past, her pain worsened about 2 weeks ago after bending over. Her pain becomes worse when laying down and walking. She describes pain as sore, aching and shooting sensation, currently rates as 10 out of 10. Some relief of pain with home exercise regimen, rest and use of medications. States she was unable to tolerate physical therapy due to severe pain. Lumbar radiographs from 2016 show mild 4 mm anterolisthesis of L4 on L5 most likely degenerative in origin. Bilateral hip radiographs from 2022 show moderate osteoarthritic changes. No prior lumbar MRI imaging. Patient denies focal weakness, numbness and tingling. No recent trauma or falls.       Review of Systems  Musculoskeletal:  Positive for back pain.  Neurological:  Negative for tingling, sensory change, focal weakness and weakness.  All other systems reviewed and are negative.  Otherwise per HPI.  Assessment & Plan: Visit Diagnoses:    ICD-10-CM   1. Lumbar radiculopathy  M54.16 XR Lumbar Spine Complete    MR LUMBAR SPINE WO CONTRAST    2. Chronic left-sided low back pain with left-sided sciatica  M54.42 XR Lumbar Spine Complete   G89.29 MR LUMBAR SPINE WO CONTRAST       Plan: Findings:  Chronic, worsening and severe left sided lower back pain radiating to buttock and down lateral leg to ankle. Also reports pain to medial part of left thigh. Patient continues to have severe pain despite good conservative  therapies such as home exercise regimen, rest and use of medications. Patients clinical presentation and exam are consistent with lumbar radiculopathy, more of L5 nerve pattern. I obtained lumbar radiographs in the office today that shows grade 1 anterolisthesis of L4-L5, multi level facet arthropathy that is more prominent to lower lumbar spine. We discussed treatment plan in detail today, next step is to place order for lumbar MRI imaging. She did voice history of anxiety and claustrophobia and is requesting lumbar MRI with sedation at the hospital. Depending on results of lumbar MRI imaging we discussed possibility of performing lumbar epidural steroid injections. I also discussed medication management today and prescribed short course of Tramadol. She has no questions at this time. I will see her back for lumbar MRI review. No red flag symptoms noted upon exam today.     Meds & Orders:  Meds ordered this encounter  Medications   traMADol (ULTRAM) 50 MG tablet    Sig: Take 1 tablet (50 mg total) by mouth every 8 (eight) hours as needed.    Dispense:  20 tablet    Refill:  0    Orders Placed This Encounter  Procedures   XR Lumbar Spine Complete   MR LUMBAR SPINE WO CONTRAST    Follow-up: Return for lumbar MRI review.   Procedures: No procedures performed      Clinical History: No specialty comments available.   She reports that she quit smoking about 44 years ago.  Her smoking use included cigarettes. She started smoking about 64 years ago. She has never used smokeless tobacco. No results for input(s): "HGBA1C", "LABURIC" in the last 8760 hours.  Objective:  VS:  HT:    WT:   BMI:     BP:   HR: bpm  TEMP: ( )  RESP:  Physical Exam Vitals and nursing note reviewed.  HENT:     Head: Normocephalic and atraumatic.     Right Ear: External ear normal.     Left Ear: External ear normal.     Nose: Nose normal.     Mouth/Throat:     Mouth: Mucous membranes are moist.  Eyes:      Extraocular Movements: Extraocular movements intact.  Cardiovascular:     Rate and Rhythm: Normal rate.     Pulses: Normal pulses.  Pulmonary:     Effort: Pulmonary effort is normal.  Abdominal:     General: Abdomen is flat. There is no distension.  Musculoskeletal:        General: Tenderness present.     Cervical back: Normal range of motion.     Comments: Patient is slow to rises from seated position to standing. Good lumbar range of motion. No pain noted with facet loading. 5/5 strength noted with bilateral hip flexion, Jefferson flexion/extension, ankle dorsiflexion/plantarflexion and EHL. No clonus noted bilaterally. No pain upon palpation of greater trochanters. No pain with internal/external rotation of bilateral hips. Sensation intact bilaterally. Dysesthesias noted to left L5 dermatome. Negative slump test bilaterally. Ambulates without aid, gait slow.    Skin:    General: Skin is warm and dry.     Capillary Refill: Capillary refill takes less than 2 seconds.  Neurological:     General: No focal deficit present.     Mental Status: She is alert and oriented to person, place, and time.  Psychiatric:        Mood and Affect: Mood normal.        Behavior: Behavior normal.     Ortho Exam  Imaging: XR Lumbar Spine Complete  Result Date: 07/20/2023 Lumbar radiographs exhibit grade 1 anterolisthesis of L4-L5, there is also biforaminal narrowing at this level. Multi level facet arthropathy, most prominent to lower lumbar spine. No fractures or dislocations.    Past Medical/Family/Surgical/Social History: Medications & Allergies reviewed per EMR, new medications updated. 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 12/20/2011   Osteoarthritis, Jefferson 12/20/2011   Mixed hyperlipidemia 12/20/2011   Depressive disorder, not elsewhere classified 12/20/2011   Essential hypertension 12/20/2011   Anemia 12/20/2011   Cancer (HCC)    Past Medical History:  Diagnosis Date   Arthritis    knees, back   Breast cancer (HCC)    Cancer (HCC) 2004   breast   Depression    GERD (gastroesophageal reflux disease)    Glaucoma    Heart palpitations    High cholesterol    History of transfusion    many years ago   Hypertension    Hypothyroidism  Itching in the vaginal area    Nocturia    OSA (obstructive sleep apnea)    tried CPAP- but unsuccessful   Renal disorder    Thyroid disease    thyroid removed   Family History  Problem Relation Age of Onset   Lung cancer Father    Breast cancer Sister    Ovarian cancer Sister    Colon cancer Neg Hx    Esophageal cancer Neg Hx    Stomach cancer Neg Hx    Pancreatic cancer Neg Hx    Liver disease Neg Hx    Past Surgical History:  Procedure Laterality Date   ABDOMINAL HYSTERECTOMY  82   BSO   BREAST BIOPSY Right pt doesnt remember   benign   BREAST SURGERY  2004   left breast removed due to cancer no chemo or radiation   MASTECTOMY Left    THYROIDECTOMY     due to knot   TOTAL Jefferson ARTHROPLASTY Left 07/28/2015   Procedure: LEFT TOTAL Jefferson ARTHROPLASTY;  Surgeon: Ollen Gross, MD;  Location: [facility];  Service: Orthopedics;  Laterality: Left;   Social History   Occupational History   Occupation: retired    Associate Professor: RETIRED  Tobacco Use   Smoking status: Former    Current packs/day: 0.00    Types: Cigarettes    Start date: 08/16/1958    Quit date: 08/16/1978    Years since quitting: 44.9   Smokeless tobacco: Never   Tobacco comments:     1 pack per week--10/16/12  Vaping Use   Vaping status: Never Used  Substance and Sexual Activity   Alcohol use: No   Drug use: No   Sexual activity: Not Currently

## 2023-07-26 ENCOUNTER — Encounter: Payer: Self-pay | Admitting: Cardiovascular Disease

## 2023-07-26 ENCOUNTER — Ambulatory Visit: Payer: Medicare Other | Attending: Cardiovascular Disease | Admitting: Cardiovascular Disease

## 2023-07-26 ENCOUNTER — Ambulatory Visit (HOSPITAL_BASED_OUTPATIENT_CLINIC_OR_DEPARTMENT_OTHER): Payer: Medicare Other | Admitting: Family

## 2023-07-26 VITALS — BP 132/70 | HR 70 | Wt 204.2 lb

## 2023-07-26 DIAGNOSIS — E785 Hyperlipidemia, unspecified: Secondary | ICD-10-CM | POA: Diagnosis not present

## 2023-07-26 DIAGNOSIS — I10 Essential (primary) hypertension: Secondary | ICD-10-CM | POA: Diagnosis not present

## 2023-07-26 DIAGNOSIS — I739 Peripheral vascular disease, unspecified: Secondary | ICD-10-CM

## 2023-07-26 DIAGNOSIS — R0989 Other specified symptoms and signs involving the circulatory and respiratory systems: Secondary | ICD-10-CM

## 2023-07-26 MED ORDER — ASPIRIN 81 MG PO TBEC: 81.0000 mg | DELAYED_RELEASE_TABLET | Freq: Every day | ORAL | Status: DC
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
## 2023-07-26 NOTE — Patient Instructions (Signed)
Medication Instructions:  START Aspirin 81 mg once daily  *If you need a refill on your cardiac medications before your next appointment, please call your pharmacy*   Lab Work: None ordered If you have labs (blood work) drawn today and your tests are completely normal, you will receive your results only by: MyChart Message (if you have MyChart) OR A paper copy in the mail If you have any lab test that is abnormal or we need to change your treatment, we will call you to review the results.   Testing/Procedures: Your physician has requested that you have a carotid duplex. This test is an ultrasound of the carotid arteries in your neck. It looks at blood flow through these arteries that supply the brain with blood. Allow one hour for this exam. There are no restrictions or special instructions. This will take place at [address] [facility], [address].  Please note: We ask at that you not bring children with you during ultrasound (echo/ vascular) testing. Due to room size and safety concerns, children are not allowed in the ultrasound rooms during exams. Our front office staff cannot provide observation of children in our lobby area while testing is being conducted. An adult accompanying a patient to their appointment will only be allowed in the ultrasound room at the discretion of the ultrasound technician under special circumstances. We apologize for any inconvenience.    Follow-Up: At [facility], you and your health needs are our priority.  As part of our continuing mission to provide you with exceptional heart care, we have created designated Provider Care Teams.  These Care Teams include your primary Cardiologist (physician) and Advanced Practice Providers (APPs -  Physician Assistants and Nurse Practitioners) who all work together to provide you with the care you need, when you need it.  We recommend signing up for the patient portal called "MyChart".  Sign up information is  provided on this After Visit Summary.  MyChart is used to connect with patients for Virtual Visits (Telemedicine).  Patients are able to view lab/test results, encounter notes, upcoming appointments, etc.  Non-urgent messages can be sent to your provider as well.   To learn more about what you can do with MyChart, go to [email or web address].    Your next appointment:   12 month(s)  Provider:   Dr. Kirke Corin

## 2023-07-29 ENCOUNTER — Ambulatory Visit: Payer: Medicare Other | Admitting: Physician Assistant

## 2023-08-12 ENCOUNTER — Ambulatory Visit (HOSPITAL_COMMUNITY)
Admission: RE | Admit: 2023-08-12 | Discharge: 2023-08-12 | Disposition: A | Discharge status: Home / Self Care | Payer: Medicare Other | Source: Ambulatory Visit | Attending: Cardiovascular Disease | Admitting: Cardiovascular Disease

## 2023-08-12 DIAGNOSIS — R0989 Other specified symptoms and signs involving the circulatory and respiratory systems: Secondary | ICD-10-CM | POA: Insufficient documentation

## 2023-08-18 ENCOUNTER — Encounter: Payer: Self-pay | Admitting: *Deleted

## 2023-08-24 ENCOUNTER — Ambulatory Visit: Payer: Medicare Other | Attending: Cardiovascular Disease | Admitting: Student

## 2023-08-24 ENCOUNTER — Encounter: Payer: Self-pay | Admitting: Student

## 2023-08-24 VITALS — BP 145/80 | HR 62

## 2023-08-24 DIAGNOSIS — I10 Essential (primary) hypertension: Secondary | ICD-10-CM

## 2023-08-24 MED ORDER — AMLODIPINE BESYLATE 2.5 MG PO TABS: 2.5000 mg | ORAL_TABLET | Freq: Every day | ORAL | 3 refills | Status: DC

## 2023-08-24 MED ORDER — OLMESARTAN MEDOXOMIL 20 MG PO TABS: 20.0000 mg | ORAL_TABLET | Freq: Every day | ORAL | 11 refills | Status: DC

## 2023-08-24 NOTE — Patient Instructions (Signed)
 Changes made by your pharmacist Robbi Blanch, PharmD at today's visit:    Instructions/Changes  (what do you need to do) Your Notes  (what you did and when you did it)  start taking Olmesartan  20 mg daily and reduce dose of amlodipine  from 5 mg to 2.5 mg daily    Walk at least 10-15 min per day    Reduce salt intake     Bring all of your meds, your BP cuff and your record of home blood pressures to your next appointment.    HOW TO TAKE YOUR BLOOD PRESSURE AT HOME  Rest 5 minutes before taking your blood pressure.  Don't smoke or drink caffeinated beverages for at least 30 minutes before. Take your blood pressure before (not after) you eat. Sit comfortably with your back supported and both feet on the floor (don't cross your legs). Elevate your arm to heart level on a table or a desk. Use the proper sized cuff. It should fit smoothly and snugly around your bare upper arm. There should be enough room to slip a fingertip under the cuff. The bottom edge of the cuff should be 1 inch above the crease of the elbow. Ideally, take 3 measurements at one sitting and record the average.  Important lifestyle changes to control high blood pressure  Intervention  Effect on the BP  Lose extra pounds and watch your waistline Weight loss is one of the most effective lifestyle changes for controlling blood pressure. If you're overweight or obese, losing even a small amount of weight can help reduce blood pressure. Blood pressure might go down by about 1 millimeter of mercury (mm Hg) with each kilogram (about 2.2 pounds) of weight lost.  Exercise regularly As a general goal, aim for at least 30 minutes of moderate physical activity every day. Regular physical activity can lower high blood pressure by about 5 to 8 mm Hg.  Eat a healthy diet Eating a diet rich in whole grains, fruits, vegetables, and low-fat dairy products and low in saturated fat and cholesterol. A healthy diet can lower high blood  pressure by up to 11 mm Hg.  Reduce salt (sodium) in your diet Even a small reduction of sodium in the diet can improve heart health and reduce high blood pressure by about 5 to 6 mm Hg.  Limit alcohol One drink equals 12 ounces of beer, 5 ounces of wine, or 1.5 ounces of 80-proof liquor.  Limiting alcohol to less than one drink a day for women or two drinks a day for men can help lower blood pressure by about 4 mm Hg.   If you have any questions or concerns please use My Chart to send questions or call the office at [phone]

## 2023-08-24 NOTE — Assessment & Plan Note (Signed)
 Assessment: BP is uncontrolled in office BP 145/80  mmHg (goal <130/80). Home BP ~145-150 does not recall DBP or HR - forgot to bring BP log  Amlodipine  5 mg daily leads to lower leg swelling which resolves upon feet elevation  Denies SOB, palpitation, chest pain, headaches,or swelling Watches sodium intake but her diet still need improvement in salt intake will work on not to add salt to her cooking  Chronic pain restricts exercise - willing to walk 10-15 around the house once a day  CKD: last BMP CrCl 49 mL/min SrCr 1.42 ( closer to baseline), electrolytes WNL  Given improved renal function will initiate long acting ARB and reduce amlodipine  dose to improve tolerability    Plan:  Start taking olmesartan  20 mg daily  Reduce amlodipine  dose to 2.5 mg daily  Reduce salt intake  Patient to bring BP monitor for validation at the next OV Patient to keep record of BP readings with heart rate and report to us  at the next visit Patient to see PharmD in 4 weeks for follow up  Follow up lab(s): BMP in one week

## 2023-08-24 NOTE — Progress Notes (Signed)
 Patient ID: Alyssa Jefferson                 DOB: 09-07-1945                      MRN: [id number]      HPI: Alyssa Jefferson is a 78 y.o. female referred by Dr. Raford to HTN clinic. PMH is significant for hypertension, CKD 3A, pericarditis, OSA intermittently on CPAP, CKD 3 AA, hyperlipidemia, and prior breast cancer.  LVEF 60-65% with normal diastolic function. Patient saw Dr.[place] Dec 03 reported her home BP ~130-140 range due to pain. Due to lower leg edema plan is to stop amlodipine  and if renal function improves initiate long acting ARB  Patient presented today for hypertension follow up. Forgot to bring BP log recalls from memory BP~145-150 range. Does not recall DBP or HR. He has new hip pain that can be the cause for elevated BP. She watches salt intake but she still adds salt to her food when she cook. Advised to cut down that in half. Due to pain unable to walk far but motivated to walk for 10-15 min per day. Have been taking amlodipine  5 mg experience lower leg swelling by the end of the day which improves with elevation.    Current HTN meds: amlodipine  5 mg daily  Previously tried: Spironolactone  - AKI and hyperkalemia.  BP goal: <130/80 CKD: last BMP CrCl 49 mL/min SrCr 1.42 ( closer to baseline), electrolytes WNL  Family History:  Relation Problem Comments  Mother (Deceased)   Father (Deceased) Lung cancer     Sister (Deceased)   Sister Breast cancer     Sister Ovarian cancer     Social History:  Alcohol: none  Tobacco use: none  Recreational drug use: none   Diet: eats out once a week  Does not snack on salty snacks  Add salt to food when cooks - will cut down that in half  Drinks - water , occasional -soda, coffee or hot chocolate   Exercise: none -due to severe sciatic pain  Willing to start 10 min walks around the house   Home BP readings: ~145-150 does not recall DBP and HR     Wt Readings from Last 3 Encounters:  07/26/23 204 lb 3.2 oz (92.6 kg)   07/19/23 203 lb 1.6 oz (92.1 kg)  06/28/23 200 lb 9.6 oz (91 kg)   BP Readings from Last 3 Encounters:  08/24/23 (!) 145/80  07/26/23 132/70  07/19/23 (!) 142/70   Pulse Readings from Last 3 Encounters:  08/24/23 62  07/26/23 70  07/19/23 68    Renal function: CrCl cannot be calculated (Patient's most recent lab result is older than the maximum 21 days allowed.).  Past Medical History:  Diagnosis Date   Arthritis    knees, back   Breast cancer (HCC)    Cancer (HCC) 2004   breast   Depression    GERD (gastroesophageal reflux disease)    Glaucoma    Heart palpitations    High cholesterol    History of transfusion    many years ago   Hypertension    Hypothyroidism    Itching in the vaginal area    Nocturia    OSA (obstructive sleep apnea)    tried CPAP- but unsuccessful   Renal disorder    Thyroid  disease    thyroid  removed    Current Outpatient Medications on File Prior to Visit  Medication Sig  Dispense Refill   albuterol  (VENTOLIN  HFA) 108 (90 Base) MCG/ACT inhaler Inhale 2 puffs into the lungs every 6 (six) hours as needed for wheezing or shortness of breath. 1 Inhaler 0   aspirin  EC 81 MG tablet Take 1 tablet (81 mg total) by mouth daily. Swallow whole.     dorzolamide-timolol (COSOPT) 22.3-6.8 MG/ML ophthalmic solution INSTILL 1 DROP IN RIGHT EYE TWICE DAILY     latanoprost  (XALATAN ) 0.005 % ophthalmic solution Place 1 drop into both eyes at bedtime. 2.5 mL 6   levothyroxine  (SYNTHROID ) 50 MCG tablet Take 1 tablet (50 mcg total) by mouth daily. 90 tablet 3   omeprazole  (PRILOSEC) 40 MG capsule Take 40 mg by mouth daily.     Polyethylene Glycol 3350  (MIRALAX  PO) Take 17 g by mouth as needed (constipation).     rosuvastatin  (CRESTOR ) 40 MG tablet Take 40 mg by mouth daily.     traMADol  (ULTRAM ) 50 MG tablet Take 1 tablet (50 mg total) by mouth every 8 (eight) hours as needed. 20 tablet 0   No current facility-administered medications on file prior to visit.     Allergies  Allergen Reactions   Atorvastatin  Calcium  Itching   Codeine Other (See Comments)    confusion   Lipitor [Atorvastatin  Calcium ] Itching    Blood pressure (!) 145/80, pulse 62, SpO2 99%.   Assessment/Plan:  1. Hypertension -  Essential hypertension Assessment: BP is uncontrolled in office BP 145/80  mmHg (goal <130/80). Home BP ~145-150 does not recall DBP or HR - forgot to bring BP log  Amlodipine  5 mg daily leads to lower leg swelling which resolves upon feet elevation  Denies SOB, palpitation, chest pain, headaches,or swelling Watches sodium intake but her diet still need improvement in salt intake will work on not to add salt to her cooking  Chronic pain restricts exercise - willing to walk 10-15 around the house once a day  CKD: last BMP CrCl 49 mL/min SrCr 1.42 ( closer to baseline), electrolytes WNL  Given improved renal function will initiate long acting ARB and reduce amlodipine  dose to improve tolerability    Plan:  Start taking olmesartan  20 mg daily  Reduce amlodipine  dose to 2.5 mg daily  Reduce salt intake  Patient to bring BP monitor for validation at the next OV Patient to keep record of BP readings with heart rate and report to us  at the next visit Patient to see PharmD in 4 weeks for follow up  Follow up lab(s): BMP in one week        Thank you  Robbi Blanch, Pharm.D  [facility]  [facility] [address], [place], [place] [address]  Phone: [phone]; Fax: [phone]

## 2023-09-02 ENCOUNTER — Telehealth: Payer: Self-pay | Admitting: Cardiovascular Disease

## 2023-09-02 NOTE — Telephone Encounter (Signed)
Returned patients call but patient was unclear about what she was calling about. She stated that she is feeling okay and has no concerns at this time.

## 2023-09-02 NOTE — Telephone Encounter (Signed)
Follow Up:     Patient said she was returning a call from today, but did not know who called. It was not Dr Jari Sportsman nurse.

## 2023-09-13 NOTE — Telephone Encounter (Signed)
BMP due Olmesartan added Jan/8. Spoke to patient she will be going for [facility] -lab on Jan 29,2025.

## 2023-09-15 LAB — BASIC METABOLIC PANEL
BUN/Creatinine Ratio: 18 (ref 12–28)
BUN: 31 mg/dL — ABNORMAL HIGH (ref 8–27)
CO2: 22 mmol/L (ref 20–29)
Calcium: 9.3 mg/dL (ref 8.7–10.3)
Chloride: 107 mmol/L — ABNORMAL HIGH (ref 96–106)
Creatinine, Ser: 1.73 mg/dL — ABNORMAL HIGH (ref 0.57–1.00)
Glucose: 87 mg/dL (ref 70–99)
Potassium: 4.9 mmol/L (ref 3.5–5.2)
Sodium: 142 mmol/L (ref 134–144)
eGFR: 30 mL/min/{1.73_m2} — ABNORMAL LOW (ref >59)

## 2023-09-16 ENCOUNTER — Telehealth: Payer: Self-pay | Admitting: Pharmacist

## 2023-09-16 NOTE — Telephone Encounter (Signed)
BMP -post olmesartan initiation - discussed with pt over the phone.  Advised to drink 6-8 glasses of water patient admits she does not drink enough water.  Continue taking olmesartan

## 2023-09-30 ENCOUNTER — Ambulatory Visit: Payer: Medicare Other | Attending: Cardiovascular Disease | Admitting: Pharmacist

## 2023-09-30 ENCOUNTER — Encounter: Payer: Self-pay | Admitting: Pharmacist

## 2023-09-30 VITALS — BP 120/78 | HR 57

## 2023-09-30 DIAGNOSIS — I10 Essential (primary) hypertension: Secondary | ICD-10-CM | POA: Diagnosis not present

## 2023-09-30 NOTE — Progress Notes (Signed)
Patient ID: ASHELY GOOSBY                 DOB: 06-05-1946                      MRN: [id number]      HPI: Alyssa Jefferson is a 78 y.o. female referred by Dr. Duke Salvia to HTN clinic. PMH is significant for hypertension, CKD 3A, pericarditis, OSA intermittently on CPAP, CKD 3 AA, hyperlipidemia, and prior breast cancer.  LVEF 60-65% with normal diastolic function. Patient saw Dr.[place] Dec 03 reported her home BP ~130-140 range due to pain. Due to lower leg edema plan is to stop amlodipine and if renal function improves initiate long acting ARB At last visit with PharmD olmesartan 20 mg was added to amlodipine and amlodipine dose was reduced from 5 mg to 2.5 mg due to lower extremities swelling.  Patient presented today for hypertension follow up. Brought in home BP monitor but battery was too low to check any BP. Reports she walks 10-15 min 4 days a week. She is having right hip pain and her fingers goes numb with certain arm movement. She will be seeing neurologist next month for that. She has cut down on salt intake. She feels overall good. Her home BP ~124/53.   Current HTN meds: amlodipine 2.5 mg daily, olmesartan 20 mg daily  Previously tried: Spironolactone - AKI and hyperkalemia.  BP goal: <130/80  Family History:  Relation Problem Comments  Mother (Deceased)   Father (Deceased) Lung cancer     Sister (Deceased)   Sister Breast cancer     Sister Ovarian cancer     Social History:  Alcohol: none  Tobacco use: none  Recreational drug use: none   Diet: eats out twice a month  Does not snack on salty snacks  Drinks - water , occasional -soda, coffee or hot chocolate   Exercise: 10-15 min walks 4 days per week  Home BP readings:~124/53 need to validate home BP monitor    Wt Readings from Last 3 Encounters:  07/26/23 204 lb 3.2 oz (92.6 kg)  07/19/23 203 lb 1.6 oz (92.1 kg)  06/28/23 200 lb 9.6 oz (91 kg)   BP Readings from Last 3 Encounters:  09/30/23 120/78  08/24/23  (!) 145/80  07/26/23 132/70   Pulse Readings from Last 3 Encounters:  09/30/23 (!) 57  08/24/23 62  07/26/23 70    Renal function: CrCl cannot be calculated (Unknown ideal weight.).  Past Medical History:  Diagnosis Date   Arthritis    knees, back   Breast cancer (HCC)    Cancer (HCC) 2004   breast   Depression    GERD (gastroesophageal reflux disease)    Glaucoma    Heart palpitations    High cholesterol    History of transfusion    many years ago   Hypertension    Hypothyroidism    Itching in the vaginal area    Nocturia    OSA (obstructive sleep apnea)    tried CPAP- but unsuccessful   Renal disorder    Thyroid disease    thyroid removed    Current Outpatient Medications on File Prior to Visit  Medication Sig Dispense Refill   albuterol (VENTOLIN HFA) 108 (90 Base) MCG/ACT inhaler Inhale 2 puffs into the lungs every 6 (six) hours as needed for wheezing or shortness of breath. 1 Inhaler 0   amLODipine (NORVASC) 2.5 MG tablet Take 1 tablet (2.5  mg total) by mouth daily. 180 tablet 3   aspirin EC 81 MG tablet Take 1 tablet (81 mg total) by mouth daily. Swallow whole.     dorzolamide-timolol (COSOPT) 22.3-6.8 MG/ML ophthalmic solution INSTILL 1 DROP IN RIGHT EYE TWICE DAILY     latanoprost (XALATAN) 0.005 % ophthalmic solution Place 1 drop into both eyes at bedtime. 2.5 mL 6   levothyroxine (SYNTHROID) 50 MCG tablet Take 1 tablet (50 mcg total) by mouth daily. 90 tablet 3   olmesartan (BENICAR) 20 MG tablet Take 1 tablet (20 mg total) by mouth daily. 30 tablet 11   omeprazole (PRILOSEC) 40 MG capsule Take 40 mg by mouth daily.     Polyethylene Glycol 3350 (MIRALAX PO) Take 17 g by mouth as needed (constipation).     rosuvastatin (CRESTOR) 40 MG tablet Take 40 mg by mouth daily.     traMADol (ULTRAM) 50 MG tablet Take 1 tablet (50 mg total) by mouth every 8 (eight) hours as needed. 20 tablet 0   No current facility-administered medications on file prior to visit.     Allergies  Allergen Reactions   Atorvastatin Calcium Itching   Codeine Other (See Comments)    confusion   Lipitor [Atorvastatin Calcium] Itching    Blood pressure 120/78, pulse (!) 57, SpO2 97%.   Assessment/Plan:  1. Hypertension -  Essential hypertension Assessment:  In office BP controlled in office BP 120/78 heart rate 57 (goal <130/80). Home BP ~124/53 on non -validated BP machine   Lower extremities swelling resolved by lowering amlodipine dose to 2.5 mg   Tolerates olmesartan and Amlodipine  well without side effects   Denies SOB, palpitation, chest pain, headaches,or swelling Follows low salt diet and walks 15 min 4 days a week   Plan:  Continue taking amlodipine 2.5 mg and olmesartan 20 mg daily  Patient to bring BP monitor for validation at the next OV Patient to keep record of BP readings with heart rate and report to [place] at the next visit Patient to see PharmD in 8 weeks for follow up  Follow up lab(s): BMP in one week    Thank you  Carmela Hurt, Pharm.D [facility] [address], [place], [place] [address]  Phone: [phone]; Fax: [phone]

## 2023-09-30 NOTE — Assessment & Plan Note (Signed)
Assessment:  In office BP controlled in office BP 120/78 heart rate 57 (goal <130/80). Home BP ~124/53 on non -validated BP machine   Lower extremities swelling resolved by lowering amlodipine dose to 2.5 mg   Tolerates olmesartan and Amlodipine  well without side effects   Denies SOB, palpitation, chest pain, headaches,or swelling Follows low salt diet and walks 15 min 4 days a week   Plan:  Continue taking amlodipine 2.5 mg and olmesartan 20 mg daily  Patient to bring BP monitor for validation at the next OV Patient to keep record of BP readings with heart rate and report to [place] at the next visit Patient to see PharmD in 8 weeks for follow up  Follow up lab(s): BMP in one week

## 2023-09-30 NOTE — Patient Instructions (Signed)
No Changes made by your pharmacist Carmela Hurt, PharmD at today's visit:  Continue taking Olmesartan 20 mg daily and amlodipine 2.5 mg daily   Bring all of your meds, your BP cuff and your record of home blood pressures to your next appointment.    HOW TO TAKE YOUR BLOOD PRESSURE AT HOME  Rest 5 minutes before taking your blood pressure.  Don't smoke or drink caffeinated beverages for at least 30 minutes before. Take your blood pressure before (not after) you eat. Sit comfortably with your back supported and both feet on the floor (don't cross your legs). Elevate your arm to heart level on a table or a desk. Use the proper sized cuff. It should fit smoothly and snugly around your bare upper arm. There should be enough room to slip a fingertip under the cuff. The bottom edge of the cuff should be 1 inch above the crease of the elbow. Ideally, take 3 measurements at one sitting and record the average.  Important lifestyle changes to control high blood pressure  Intervention  Effect on the BP  Lose extra pounds and watch your waistline Weight loss is one of the most effective lifestyle changes for controlling blood pressure. If you're overweight or obese, losing even a small amount of weight can help reduce blood pressure. Blood pressure might go down by about 1 millimeter of mercury (mm Hg) with each kilogram (about 2.2 pounds) of weight lost.  Exercise regularly As a general goal, aim for at least 30 minutes of moderate physical activity every day. Regular physical activity can lower high blood pressure by about 5 to 8 mm Hg.  Eat a healthy diet Eating a diet rich in whole grains, fruits, vegetables, and low-fat dairy products and low in saturated fat and cholesterol. A healthy diet can lower high blood pressure by up to 11 mm Hg.  Reduce salt (sodium) in your diet Even a small reduction of sodium in the diet can improve heart health and reduce high blood pressure by about 5 to 6 mm Hg.   Limit alcohol One drink equals 12 ounces of beer, 5 ounces of wine, or 1.5 ounces of 80-proof liquor.  Limiting alcohol to less than one drink a day for women or two drinks a day for men can help lower blood pressure by about 4 mm Hg.   If you have any questions or concerns please use My Chart to send questions or call the office at [phone]

## 2023-10-11 LAB — BASIC METABOLIC PANEL
BUN/Creatinine Ratio: 26 (ref 12–28)
BUN: 43 mg/dL — ABNORMAL HIGH (ref 8–27)
CO2: 18 mmol/L — ABNORMAL LOW (ref 20–29)
Calcium: 8.7 mg/dL (ref 8.7–10.3)
Chloride: 104 mmol/L (ref 96–106)
Creatinine, Ser: 1.68 mg/dL — ABNORMAL HIGH (ref 0.57–1.00)
Glucose: 106 mg/dL — ABNORMAL HIGH (ref 70–99)
Potassium: 4.6 mmol/L (ref 3.5–5.2)
Sodium: 143 mmol/L (ref 134–144)
eGFR: 31 mL/min/{1.73_m2} — ABNORMAL LOW (ref >59)

## 2023-10-12 ENCOUNTER — Encounter: Payer: Self-pay | Admitting: Pharmacist

## 2023-10-26 ENCOUNTER — Other Ambulatory Visit: Payer: Self-pay

## 2023-10-26 ENCOUNTER — Encounter (HOSPITAL_COMMUNITY): Payer: Self-pay | Admitting: *Deleted

## 2023-10-26 NOTE — Progress Notes (Signed)
 Anesthesia Chart Review: Alyssa Jefferson  Case: [id number] Date/Time: 10/27/23 0800   Procedure: MRI WITH ANESTHESIA OF LUMBAR SPINE WITHOUT CONTRAST   Anesthesia type: General   Pre-op diagnosis: LUMBAR RADICULOPATHY   Location: [facility] RAD[facility]OLOG[facility] RO[facility] / [facility] OR   Surgeons: Radiologist, Medication, MD       DISCUSSION: Patient is a 78 year old female scheduled for the above procedure. Alyssa Goodie, NP completed H&P form on 09/30/23 (scanned under Media tab, Radiology Order).   History includes former smoker (quit 08/16/78), HTN, cholesterolemia, thyroidectomy (post-surgical hypothyroidism), CKD, GERD, glaucoma, OSA (does not use CPAP), left breast cancer (left partial mastectomy 04/25/03->left mastectomy 06/20/03), hysterectomy, osteoarthritis (left TKA 07/28/15), obesity.   PAD evaluation by Dr. Kirke Corin on 07/26/23. Right carotid bruit heard, but carotid US showed only 1-39% bilateral ICA stenosis and no follow-up testing recommended. LE Artrial US showed >50% stenosis in her right CIA, bilateral EIA.  + Femoral pulses. ASA 81 mg recommended. She was having low back pain radiating to her left leg. He thought symptoms likely related to her back and not PAD.   Last visit with cardiologist Dr. Duke Salvia was on 07/19/23. Patient had a history of pleuritic chest pain with elevated inflammatory markers ~ 03/2022. She was treated for pericarditis with colchicine.  She deferred ibuprofen due to CKD.  04/2022 echo showed LVEF 60-65%, no RWMA, normal diastolic parameters, normal RV systolic function, trivial MR. At last visit, amlodipine reduced due to peripheral edema and referred to [facility] Pharm[facility] for follow-up HTN and monitoring of renal function in setting of ARB. Spironolactone was discontinued after K 5.8 and Creatinine up to 2.17 on 07/03/23. Most recent labs on 10/10/23 showed Creatinine 1.68, K 4.6.   Anesthesia team to evaluate on the day of procedure.      VS: Ht 5\' 2"  (1.575 m)   Wt 95.3 kg   BMI  38.41 kg/m  BP Readings from Last 3 Encounters:  09/30/23 120/78  08/24/23 (!) 145/80  07/26/23 132/70   Pulse Readings from Last 3 Encounters:  09/30/23 (!) 57  08/24/23 62  07/26/23 70     PROVIDERS: Ellyn Hack, MD is PCP  Chilton Si, MD is primary cardiologist  Lorine Bears, MD is cardiologist (PAD) Sabra Heck, MD is nephrologist  LABS:  Lab Results  Component Value Date   WBC 6.2 06/24/2023   HGB 10.7 (L) 06/24/2023   HCT 34.7 (L) 06/24/2023   PLT 254 06/24/2023   GLUCOSE 106 (H) 10/10/2023   ALT 14 06/24/2023   AST 13 (L) 06/24/2023   NA 143 10/10/2023   K 4.6 10/10/2023   CL 104 10/10/2023   CREATININE 1.68 (H) 10/10/2023   BUN 43 (H) 10/10/2023   CO2 18 (L) 10/10/2023   TSH 1.220 06/23/2023   Lab Results  Component Value Date   CREATININE 1.68 (H) 10/10/2023   CREATININE 1.73 (H) 09/14/2023   CREATININE 1.42 (H) 07/19/2023     EKG: EKG 06/24/23:  Sinus rhythm Incomplete left bundle branch block Confirmed by Virgina Norfolk [phone]) on 06/24/2023 2:03:14 PM  CV: US Carotid 08/12/23: Summary:  - Right Carotid: Velocities in the right ICA are consistent with a 1-39%  stenosis.  - Left Carotid: Velocities in the left ICA are consistent with a 1-39%  stenosis.  - Vertebrals: Bilateral vertebral arteries demonstrate antegrade flow.  - Subclavians: Right subclavian artery was stenotic. Normal flow  hemodynamics were seen in the left subclavian artery.    Echo 07/01/23: IMPRESSIONS  1. Left ventricular ejection fraction, by estimation, is 60 to 65%. The  left ventricle has normal function. The left ventricle has no regional  wall motion abnormalities. Left ventricular diastolic parameters are  consistent with Grade I diastolic  dysfunction (impaired relaxation). The average left ventricular global  longitudinal strain is -19.2 %. The global longitudinal strain is normal.   2. Right ventricular systolic function is normal. The right  ventricular  size is normal.   3. The mitral valve is normal in structure. No evidence of mitral valve  regurgitation. No evidence of mitral stenosis.   4. The aortic valve is tricuspid. Aortic valve regurgitation is not  visualized. No aortic stenosis is present.   5. The inferior vena cava is normal in size with greater than 50%  respiratory variability, suggesting right atrial pressure of 3 mmHg.    Aorta-Iliac [place] 06/09/23: Summary:  Right Common Iliac  >50% stenosis Right External Iliac  >50% stenosis Left External Iliac  >50% stenosis   Nuclear stress test 09/01/12: Conclusions: Perfusion imaging study demonstrated mild soft tissue attenuation consistent with diaphragmatic attenuation.  There was no evidence of ischemia or scar.  The left systolic function was normal.  This is a low risk study.  Past Medical History:  Diagnosis Date   Arthritis    knees, back   Cancer (HCC) 2004   breast   Depression    GERD (gastroesophageal reflux disease)    Glaucoma    Heart palpitations    High cholesterol    History of transfusion    many years ago   Hypertension    Hypothyroidism    Itching in the vaginal area    Nocturia    OSA (obstructive sleep apnea)    tried CPAP- but unsuccessful   Renal disorder    Thyroid disease    thyroid removed    Past Surgical History:  Procedure Laterality Date   ABDOMINAL HYSTERECTOMY  1982   BSO   BREAST BIOPSY Right pt doesnt remember   benign   BREAST SURGERY  2004   left breast removed due to cancer no chemo or radiation   COLONOSCOPY     MASTECTOMY Left    THYROIDECTOMY     due to knot   TOTAL KNEE ARTHROPLASTY Left 07/28/2015   Procedure: LEFT TOTAL KNEE ARTHROPLASTY;  Surgeon: Ollen Gross, MD;  Location: [facility] ORS;  Service: Orthopedics;  Laterality: Left;    MEDICATIONS: No current facility-administered medications for this encounter.    albuterol (VENTOLIN HFA) 108 (90 Base) MCG/ACT inhaler   amLODipine (NORVASC) 2.5 MG  tablet   cholecalciferol (VITAMIN D3) 25 MCG (1000 UNIT) tablet   dorzolamide-timolol (COSOPT) 22.3-6.8 MG/ML ophthalmic solution   latanoprost (XALATAN) 0.005 % ophthalmic solution   levothyroxine (SYNTHROID) 50 MCG tablet   olmesartan (BENICAR) 20 MG tablet   omeprazole (PRILOSEC) 40 MG capsule   polyethylene glycol powder (GLYCOLAX/MIRALAX) 17 GM/SCOOP powder   Propylene Glycol (SYSTANE COMPLETE) 0.6 % SOLN   rosuvastatin (CRESTOR) 40 MG tablet   traMADol (ULTRAM) 50 MG tablet    Shonna Chock, PA-C Surgical Short Stay/Anesthesiology [facility] Phone [phone] [facility] Phone [phone] 10/26/2023 10:36 AM

## 2023-10-26 NOTE — Anesthesia Preprocedure Evaluation (Signed)
 Anesthesia Evaluation  Patient identified by MRN, date of birth, ID band Patient awake    Reviewed: Allergy & Precautions, NPO status , Patient's Chart, lab work & pertinent test results  Airway Mallampati: II  TM Distance: >3 FB Neck ROM: Full    Dental no notable dental hx. (+) Teeth Intact, Dental Advisory Given   Pulmonary sleep apnea (does not use CPAP) , former smoker   Pulmonary exam normal breath sounds clear to auscultation       Cardiovascular hypertension, Pt. on medications Normal cardiovascular exam Rhythm:Regular Rate:Normal  TTE 2024 1. Left ventricular ejection fraction, by estimation, is 60 to 65%. The  left ventricle has normal function. The left ventricle has no regional  wall motion abnormalities. Left ventricular diastolic parameters are  consistent with Grade I diastolic  dysfunction (impaired relaxation). The average left ventricular global  longitudinal strain is -19.2 %. The global longitudinal strain is normal.   2. Right ventricular systolic function is normal. The right ventricular  size is normal.   3. The mitral valve is normal in structure. No evidence of mitral valve  regurgitation. No evidence of mitral stenosis.   4. The aortic valve is tricuspid. Aortic valve regurgitation is not  visualized. No aortic stenosis is present.   5. The inferior vena cava is normal in size with greater than 50%  respiratory variability, suggesting right atrial pressure of 3 mmHg.     Neuro/Psych  PSYCHIATRIC DISORDERS  Depression    negative neurological ROS     GI/Hepatic Neg liver ROS,GERD  ,,  Endo/Other  Hypothyroidism    Renal/GU Renal InsufficiencyRenal disease  negative genitourinary   Musculoskeletal  (+) Arthritis ,    Abdominal   Peds  Hematology negative hematology ROS (+)   Anesthesia Other Findings   Reproductive/Obstetrics                              Anesthesia Physical Anesthesia Plan  ASA: 3  Anesthesia Plan: General   Post-op Pain Management: Minimal or no pain anticipated   Induction: Intravenous  PONV Risk Score and Plan: 3 and Ondansetron, Dexamethasone and Treatment may vary due to age or medical condition  Airway Management Planned: LMA  Additional Equipment:   Intra-op Plan:   Post-operative Plan: Extubation in OR  Informed Consent: I have reviewed the patients History and Physical, chart, labs and discussed the procedure including the risks, benefits and alternatives for the proposed anesthesia with the patient or authorized representative who has indicated his/her understanding and acceptance.     Dental advisory given  Plan Discussed with: CRNA  Anesthesia Plan Comments: (PAT note written 10/26/2023 by Shonna Chock, PA-C.  )       Anesthesia Quick Evaluation

## 2023-10-26 NOTE — Progress Notes (Signed)
 PCP - Dr Velta Addison Cardiologist - Sherre Lain  Chest x-ray - n/a EKG - 06/24/23 Stress Test - 09/01/12 ECHO - 07/01/23 Cardiac Cath - n/a  ICD Pacemaker/Loop - n/a  Sleep Study -  Yes, 03/2019 CPAP - does not use CPAP  Diabetes - n/a  Aspirin and Blood Thinner Instructions:  n/a  NPO  Anesthesia review: Yes  STOP now taking any Aspirin (unless otherwise instructed by your surgeon), Aleve, Naproxen, Ibuprofen, Motrin, Advil, Goody's, BC's, all herbal medications, fish oil, and all vitamins.   Coronavirus Screening Do you have any of the following symptoms:  Cough yes/no: No Fever (>100.63F)  yes/no: No Runny nose yes/no: No Sore throat yes/no: No Difficulty breathing/shortness of breath  yes/no: No  Have you traveled in the last 14 days and where? yes/no: No  Patient verbalized understanding of instructions that were given via phone.

## 2023-10-27 ENCOUNTER — Ambulatory Visit (HOSPITAL_COMMUNITY)
Admission: RE | Admit: 2023-10-27 | Discharge: 2023-10-27 | Disposition: A | Discharge status: Home / Self Care | Payer: Medicare Other | Attending: Physical Medicine and Rehabilitation | Admitting: Physical Medicine and Rehabilitation

## 2023-10-27 ENCOUNTER — Encounter (HOSPITAL_COMMUNITY): Payer: Self-pay

## 2023-10-27 ENCOUNTER — Encounter (HOSPITAL_COMMUNITY)
Admission: RE | Disposition: A | Discharge status: Home / Self Care | Payer: Self-pay | Source: Home / Self Care | Attending: Physical Medicine and Rehabilitation

## 2023-10-27 ENCOUNTER — Ambulatory Visit (HOSPITAL_BASED_OUTPATIENT_CLINIC_OR_DEPARTMENT_OTHER): Admitting: Vascular Surgery

## 2023-10-27 ENCOUNTER — Other Ambulatory Visit: Payer: Self-pay

## 2023-10-27 ENCOUNTER — Ambulatory Visit (HOSPITAL_COMMUNITY): Admitting: Vascular Surgery

## 2023-10-27 ENCOUNTER — Ambulatory Visit (HOSPITAL_COMMUNITY)
Admission: RE | Admit: 2023-10-27 | Discharge: 2023-10-27 | Disposition: A | Discharge status: Home / Self Care | Payer: Medicare Other | Source: Ambulatory Visit | Attending: Physical Medicine and Rehabilitation | Admitting: Physical Medicine and Rehabilitation

## 2023-10-27 DIAGNOSIS — M5416 Radiculopathy, lumbar region: Secondary | ICD-10-CM

## 2023-10-27 DIAGNOSIS — I129 Hypertensive chronic kidney disease with stage 1 through stage 4 chronic kidney disease, or unspecified chronic kidney disease: Secondary | ICD-10-CM | POA: Insufficient documentation

## 2023-10-27 DIAGNOSIS — Z87891 Personal history of nicotine dependence: Secondary | ICD-10-CM

## 2023-10-27 DIAGNOSIS — K219 Gastro-esophageal reflux disease without esophagitis: Secondary | ICD-10-CM | POA: Diagnosis not present

## 2023-10-27 DIAGNOSIS — N183 Chronic kidney disease, stage 3 unspecified: Secondary | ICD-10-CM

## 2023-10-27 DIAGNOSIS — G4733 Obstructive sleep apnea (adult) (pediatric): Secondary | ICD-10-CM | POA: Diagnosis not present

## 2023-10-27 DIAGNOSIS — M4726 Other spondylosis with radiculopathy, lumbar region: Secondary | ICD-10-CM | POA: Insufficient documentation

## 2023-10-27 DIAGNOSIS — N189 Chronic kidney disease, unspecified: Secondary | ICD-10-CM | POA: Insufficient documentation

## 2023-10-27 DIAGNOSIS — M4316 Spondylolisthesis, lumbar region: Secondary | ICD-10-CM | POA: Diagnosis not present

## 2023-10-27 DIAGNOSIS — G8929 Other chronic pain: Secondary | ICD-10-CM

## 2023-10-27 DIAGNOSIS — M48061 Spinal stenosis, lumbar region without neurogenic claudication: Secondary | ICD-10-CM | POA: Diagnosis not present

## 2023-10-27 HISTORY — PX: RADIOLOGY WITH ANESTHESIA: SHX6223

## 2023-10-27 LAB — CBC
HCT: 32 % — ABNORMAL LOW (ref 36.0–46.0)
Hemoglobin: 10 g/dL — ABNORMAL LOW (ref 12.0–15.0)
MCH: 26.7 pg (ref 26.0–34.0)
MCHC: 31.3 g/dL (ref 30.0–36.0)
MCV: 85.6 fL (ref 80.0–100.0)
Platelets: 232 10*3/uL (ref 150–400)
RBC: 3.74 MIL/uL — ABNORMAL LOW (ref 3.87–5.11)
RDW: 13.5 % (ref 11.5–15.5)
WBC: 4.7 10*3/uL (ref 4.0–10.5)
nRBC: 0 % (ref 0.0–0.2)

## 2023-10-27 SURGERY — MRI WITH ANESTHESIA
Anesthesia: General

## 2023-10-27 MED ORDER — CHLORHEXIDINE GLUCONATE 0.12 % MT SOLN: 15.0000 mL | Freq: Once | OROMUCOSAL | Status: AC

## 2023-10-27 MED ORDER — LIDOCAINE 2% (20 MG/ML) 5 ML SYRINGE
INTRAMUSCULAR | Status: DC | PRN
Start: 2023-10-27 — End: 2023-10-27
  Administered 2023-10-27: 60 mg via INTRAVENOUS

## 2023-10-27 MED ORDER — PROPOFOL 10 MG/ML IV BOLUS
INTRAVENOUS | Status: DC | PRN
  Administered 2023-10-27: 100 mg via INTRAVENOUS

## 2023-10-27 MED ORDER — ORAL CARE MOUTH RINSE: 15.0000 mL | Freq: Once | OROMUCOSAL | Status: AC

## 2023-10-27 MED ORDER — ONDANSETRON HCL 4 MG/2ML IJ SOLN
INTRAMUSCULAR | Status: DC | PRN
  Administered 2023-10-27: 4 mg via INTRAVENOUS

## 2023-10-27 MED ORDER — LACTATED RINGERS IV SOLN
INTRAVENOUS | Status: DC
Start: 2023-10-27 — End: 2023-10-27

## 2023-10-27 MED ORDER — DEXAMETHASONE SODIUM PHOSPHATE 10 MG/ML IJ SOLN
INTRAMUSCULAR | Status: DC | PRN
  Administered 2023-10-27: 5 mg via INTRAVENOUS

## 2023-10-27 MED ORDER — PHENYLEPHRINE 80 MCG/ML (10ML) SYRINGE FOR IV PUSH (FOR BLOOD PRESSURE SUPPORT)
PREFILLED_SYRINGE | INTRAVENOUS | Status: DC | PRN
  Administered 2023-10-27: 80 ug via INTRAVENOUS

## 2023-10-27 MED ORDER — PHENYLEPHRINE HCL-NACL 20-0.9 MG/250ML-% IV SOLN
INTRAVENOUS | Status: DC | PRN
  Administered 2023-10-27: 20 ug/min via INTRAVENOUS

## 2023-10-27 MED ORDER — CHLORHEXIDINE GLUCONATE 0.12 % MT SOLN
OROMUCOSAL | Status: AC
  Administered 2023-10-27: 15 mL via OROMUCOSAL
  Filled 2023-10-27: qty 15

## 2023-10-27 NOTE — Transfer of Care (Signed)
 Immediate Anesthesia Transfer of Care Note  Patient: Alyssa Jefferson  Procedure(s) Performed: MRI WITH ANESTHESIA OF LUMBAR SPINE WITHOUT CONTRAST  Patient Location: PACU  Anesthesia Type:General  Level of Consciousness: awake, alert , and oriented  Airway & Oxygen Therapy: Patient Spontanous Breathing  Post-op Assessment: Report given to RN and Post -op Vital signs reviewed and stable  Post vital signs: Reviewed and stable  Last Vitals:  Vitals Value Taken Time  BP 123/50 10/27/23 0907  Temp 37 C 10/27/23 0907  Pulse 51 10/27/23 0911  Resp 16 10/27/23 0912  SpO2 90 % 10/27/23 0911  Vitals shown include unfiled device data.  Last Pain:  Vitals:   10/27/23 0653  TempSrc:   PainSc: 4       Patients Stated Pain Goal: 1 (10/27/23 1610)  Complications: No notable events documented.

## 2023-10-27 NOTE — Anesthesia Procedure Notes (Signed)
 Procedure Name: LMA Insertion Date/Time: 10/27/2023 8:22 AM  Performed by: Camillia Herter, CRNAPre-anesthesia Checklist: Patient identified, Emergency Drugs available, Suction available and Patient being monitored Patient Re-evaluated:Patient Re-evaluated prior to induction Oxygen Delivery Method: Circle System Utilized Preoxygenation: Pre-oxygenation with 100% oxygen Induction Type: IV induction Ventilation: Mask ventilation without difficulty LMA: LMA with gastric port inserted LMA Size: 4.0 Number of attempts: 1 Airway Equipment and Method: Bite block Placement Confirmation: positive ETCO2 Tube secured with: Tape Dental Injury: Teeth and Oropharynx as per pre-operative assessment

## 2023-10-27 NOTE — Discharge Instructions (Signed)

## 2023-10-27 NOTE — Anesthesia Postprocedure Evaluation (Signed)
 Anesthesia Post Note  Patient: Alyssa Jefferson  Procedure(s) Performed: MRI WITH ANESTHESIA OF LUMBAR SPINE WITHOUT CONTRAST     Patient location during evaluation: PACU Anesthesia Type: General Level of consciousness: awake and alert Pain management: pain level controlled Vital Signs Assessment: post-procedure vital signs reviewed and stable Respiratory status: spontaneous breathing, nonlabored ventilation, respiratory function stable and patient connected to nasal cannula oxygen Cardiovascular status: blood pressure returned to baseline and stable Postop Assessment: no apparent nausea or vomiting Anesthetic complications: no  No notable events documented.  Last Vitals:  Vitals:   10/27/23 0915 10/27/23 0930  BP: (!) 127/59 (!) 120/58  Pulse: (!) 53 (!) 53  Resp: 14 13  Temp:  36.6 C  SpO2: 98% 99%    Last Pain:  Vitals:   10/27/23 0930  TempSrc:   PainSc: 0-No pain                 Dayzee Trower L Neno Hohensee

## 2023-10-28 ENCOUNTER — Encounter (HOSPITAL_COMMUNITY): Payer: Self-pay | Admitting: Radiology

## 2023-11-08 ENCOUNTER — Encounter: Payer: Self-pay | Admitting: Physical Medicine and Rehabilitation

## 2023-11-08 ENCOUNTER — Ambulatory Visit: Admitting: Physical Medicine and Rehabilitation

## 2023-11-08 ENCOUNTER — Other Ambulatory Visit (INDEPENDENT_AMBULATORY_CARE_PROVIDER_SITE_OTHER)

## 2023-11-08 DIAGNOSIS — R1031 Right lower quadrant pain: Secondary | ICD-10-CM | POA: Diagnosis not present

## 2023-11-08 DIAGNOSIS — M5442 Lumbago with sciatica, left side: Secondary | ICD-10-CM | POA: Diagnosis not present

## 2023-11-08 DIAGNOSIS — M25551 Pain in right hip: Secondary | ICD-10-CM

## 2023-11-08 DIAGNOSIS — G8929 Other chronic pain: Secondary | ICD-10-CM

## 2023-11-08 DIAGNOSIS — M48061 Spinal stenosis, lumbar region without neurogenic claudication: Secondary | ICD-10-CM | POA: Diagnosis not present

## 2023-11-08 DIAGNOSIS — M47816 Spondylosis without myelopathy or radiculopathy, lumbar region: Secondary | ICD-10-CM

## 2023-11-08 NOTE — Progress Notes (Unsigned)
 Pain Scale   Average Pain 5        +Driver, -BT, -Dye Allergies.

## 2023-11-08 NOTE — Progress Notes (Unsigned)
 Alyssa Jefferson - 78 y.o. female MRN [id number]  Date of birth: [birth year]  Office Visit Note: Visit Date: 11/08/2023 PCP: Ellyn Hack, MD Referred by: Arnette Felts, FNP  Subjective: Chief Complaint  Patient presents with   Lower Back - Pain   HPI: Alyssa Jefferson is a 78 y.o. female who comes in today for evaluation of chronic, worsening and severe left lower back pain radiating to lateral thigh. Pain ongoing for several years, worsens with prolonged sitting and laying in bed. She describes her pain as sore, aching and shooting sensation, currently rates as 5 out of 10. Some relief of pain with home exercise regimen, rest and use of medications. Some relief of pain with Tramadol. History of formal physical in the past that increased her pain. Recent lumbar MRI imaging exhibits multi level degenerative changes, active facet arthritis at the level of L4-L5. There is also multi level foraminal stenosis. No high grade spinal canal stenosis noted. Patient denies focal weakness, numbness and tingling. No recent trauma or falls.   Her biggest concern today is chronic right sided groin pain, ongoing for about 1 year. She reports difficulty bearing weight on right leg and getting in and our of car. 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 Previously treated by Dr. Ollen Gross for chronic bilateral knee pain.      Review of Systems  Musculoskeletal:  Positive for back pain, joint pain and myalgias.  Neurological:  Negative for tingling, sensory change, focal weakness and weakness.  All other systems reviewed and are negative.  Otherwise per HPI.  Assessment & Plan: Visit Diagnoses:    ICD-10-CM   1. Pain in right hip  M25.551 XR HIP UNILAT W OR W/O PELVIS 2-3 VIEWS RIGHT    2. Groin pain, right  R10.31 XR HIP UNILAT W OR W/O PELVIS 2-3 VIEWS RIGHT    3. Chronic left-sided low back pain with left-sided sciatica  M54.42    G89.29     4. Foraminal  stenosis of lumbar region  M48.061     5. Facet arthropathy, lumbar  M47.816        Plan: Findings:  1. Chronic, worsening and severe left sided lower back pain radiating down to lateral thigh region. Patient continues to have severe pain despite good conservative therapies such as formal physical therapy, home exercise regimen, rest and use of medications. Patients clinical presentation and exam are complex, differentials include lumbar radiculopathy vs facet mediated pain. There is active facet arthropathy at the level of L4-L5 and multi level foraminal stenosis noted. We discussed treatment plan in detail today, including possibility of performing lumbar injections. She would like to hold on injections at this time as she would like to discuss options with her family. I informed her to let us know if she would like to proceed with lumbar injection.   2. Chronic, worsening and severe right sided hip/groin pain. Her biggest pain generator today seems to be right groin region. She does have pain with rotation of right hip on exam today. I obtained bilateral hip radiographs in the office today that show joint space narrowing bilaterally, severe osteoarthritis noted to right hip. We discussed treatment options in detail today. Next step is to perform diagnostic and hopefully therapeutic right intra-articular hip injection under fluoroscopic guidance. We will get her in quickly. I also placed referral to Dr. Doneen Poisson for further management of right hip pain.     Meds &  Orders: No orders of the defined types were placed in this encounter.   Orders Placed This Encounter  Procedures   XR HIP UNILAT W OR W/O PELVIS 2-3 VIEWS RIGHT    Follow-up: Return for Right intra-articular hip injection.   Procedures: No procedures performed      Clinical History: CLINICAL DATA:  Low back pain with symptoms persisting over 6 weeks of treatment. Pain radiates down the left leg   EXAM: MRI LUMBAR  SPINE WITHOUT CONTRAST   TECHNIQUE: Multiplanar, multisequence MR imaging of the lumbar spine was performed. No intravenous contrast was administered.   COMPARISON:  Lumbar radiography 07/20/2023   FINDINGS: Segmentation:  5 lumbar type vertebrae   Alignment:  Mild L4-5 anterolisthesis.   Vertebrae: No fracture, evidence of discitis, or bone lesion. Marrow edema around both L4-5 facets to a mild degree. Marrow edema at the left L3-4 facet and endplate. Degenerative fatty marrow conversion at L5-S1 endplates.   Conus medullaris and cauda equina: Conus extends to the L1-2 level. Conus and cauda equina appear normal.   Paraspinal and other soft tissues: Scattered colonic diverticula. No perispinal mass. Mild periarticular edema around the L4-5 facets.   Disc levels:   L2-L3: Mild disc desiccation and circumferential bulge.   L3-L4: Disc narrowing and circumferential bulging. Degenerative facet spurring and ligamentum flavum thickening. Moderate left foraminal narrowing. A left foraminal to extraforaminal herniation is questioned on T1 and STIR imaging, which would impinge on the left L3 nerve root.   L4-L5: Degenerative facet spurring which is bulky with ligamentous thickening and bilateral facet joint effusion. The disc is desiccated and narrowed with bulging. Left foraminal protrusion and at least moderate foraminal stenosis. Mild spinal stenosis crowding both subarticular recesses.   L5-S1:Disc collapse and endplate degeneration with ridging and facet spurring eccentric to the left. Moderate left foraminal impingement. Right foraminal and spinal canal narrowing is mild.   IMPRESSION: 1. Lumbar spine degeneration especially at L3-4 and below with mild anterolisthesis and active facet arthritis at L4-5. Facet arthritis and endplate edema asymmetric to the left at L3-4. 2. Foraminal impingement on the left at L3-4 to L5-S1; question far lateral to foraminal herniation on  the left at L3-4.     Electronically Signed   By: Tiburcio Pea M.D.   On: 10/27/2023 09:01   She reports that she quit smoking about 45 years ago. Her smoking use included cigarettes. She started smoking about 65 years ago. She has never used smokeless tobacco. No results for input(s): "HGBA1C", "LABURIC" in the last 8760 hours.  Objective:  VS:  HT:    WT:   BMI:     BP:   HR: bpm  TEMP: ( )  RESP:  Physical Exam Vitals and nursing note reviewed.  HENT:     Head: Normocephalic and atraumatic.     Right Ear: External ear normal.     Left Ear: External ear normal.     Nose: Nose normal.     Mouth/Throat:     Mouth: Mucous membranes are moist.  Eyes:     Extraocular Movements: Extraocular movements intact.  Cardiovascular:     Rate and Rhythm: Normal rate.     Pulses: Normal pulses.  Pulmonary:     Effort: Pulmonary effort is normal.  Abdominal:     General: Abdomen is flat. There is no distension.  Musculoskeletal:        General: Tenderness present.     Cervical back: Normal range of motion.  Comments: Patient rises from seated position to standing without difficulty. Good lumbar range of motion. No pain noted with facet loading. 5/5 strength noted with bilateral hip flexion, knee flexion/extension, ankle dorsiflexion/plantarflexion and EHL. No clonus noted bilaterally. No pain upon palpation of greater trochanters. Pain with external rotation of right hip. Sensation intact bilaterally. Negative slump test bilaterally. Ambulates without aid, gait steady.     Skin:    General: Skin is warm and dry.     Capillary Refill: Capillary refill takes less than 2 seconds.  Neurological:     General: No focal deficit present.     Mental Status: She is alert and oriented to person, place, and time.  Psychiatric:        Mood and Affect: Mood normal.        Behavior: Behavior normal.     Ortho Exam  Imaging: No results found.  Past Medical/Family/Surgical/Social  History: Medications & Allergies reviewed per EMR, new medications updated. 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 12/20/2011   Osteoarthritis, knee 12/20/2011   Mixed hyperlipidemia 12/20/2011   Depressive disorder, not elsewhere classified 12/20/2011   Essential hypertension 12/20/2011   Anemia 12/20/2011   Cancer (HCC)    Past Medical History:  Diagnosis Date   Arthritis    knees, back   Cancer (HCC) 2004   breast   Depression    GERD (gastroesophageal reflux disease)    Glaucoma    Heart palpitations    High cholesterol    History of transfusion    many years ago   Hypertension    Hypothyroidism    Itching in the vaginal area    Nocturia    OSA (obstructive sleep apnea)    tried CPAP- but unsuccessful   Renal disorder    Thyroid disease    thyroid removed   Family History  Problem Relation Age of Onset   Lung cancer Father    Breast cancer Sister    Ovarian cancer Sister    Colon cancer Neg Hx    Esophageal cancer Neg Hx    Stomach cancer Neg Hx    Pancreatic cancer Neg Hx    Liver disease Neg Hx    Past Surgical History:  Procedure Laterality Date   ABDOMINAL HYSTERECTOMY  1982   BSO   BREAST BIOPSY Right pt doesnt remember   benign   BREAST SURGERY  2004   left breast removed due to cancer no chemo or radiation   COLONOSCOPY     MASTECTOMY Left    RADIOLOGY WITH ANESTHESIA N/A 10/27/2023   Procedure: MRI WITH ANESTHESIA OF LUMBAR SPINE WITHOUT CONTRAST;  Surgeon:  Radiologist, Medication, MD;  Location: [facility] OR;  Service: Radiology;  Laterality: N/A;   THYROIDECTOMY     due to knot   TOTAL KNEE ARTHROPLASTY Left 07/28/2015   Procedure: LEFT TOTAL KNEE ARTHROPLASTY;  Surgeon: Ollen Gross, MD;  Location: [facility];  Service: Orthopedics;  Laterality: Left;   Social History   Occupational History   Occupation: retired  Employer: RETIRED  Tobacco Use   Smoking status: Former    Current packs/day: 0.00    Types: Cigarettes    Start date: 08/16/1958    Quit date: 08/16/1978    Years since quitting: 45.2   Smokeless tobacco: Never   Tobacco comments:    1 pack per week--10/16/12  Vaping Use   Vaping status: Never Used  Substance and Sexual Activity   Alcohol use: No   Drug use: No   Sexual activity: Yes    Birth control/protection: Surgical    Comment: Hysterectomy

## 2023-11-10 ENCOUNTER — Ambulatory Visit: Admitting: Physical Medicine and Rehabilitation

## 2023-11-10 ENCOUNTER — Other Ambulatory Visit: Payer: Self-pay

## 2023-11-10 VITALS — BP 146/72 | HR 72

## 2023-11-10 DIAGNOSIS — M25551 Pain in right hip: Secondary | ICD-10-CM | POA: Diagnosis not present

## 2023-11-10 NOTE — Progress Notes (Unsigned)
 Alyssa Jefferson - 78 y.o. female MRN [id number]  Date of birth: 1945-12-17  Office Visit Note: Visit Date: 11/10/2023 PCP: Ellyn Hack, MD Referred by: Ellyn Hack, MD  Subjective: Chief Complaint  Patient presents with   Right Hip - Pain   HPI:  Alyssa Jefferson is a 78 y.o. female who comes in todayHPI ROS Otherwise per HPI.  Assessment & Plan: Visit Diagnoses: No diagnosis found.  Plan: No additional findings.   Meds & Orders: No orders of the defined types were placed in this encounter.  No orders of the defined types were placed in this encounter.   Follow-up: No follow-ups on file.   Procedures: Large Joint Inj: R hip joint on 11/10/2023 3:56 PM Indications: diagnostic evaluation and pain Details: 22 G 3.5 in needle, fluoroscopy-guided anterior approach  Arthrogram: No  Medications: 4 mL bupivacaine 0.25 %; 40 mg triamcinolone acetonide 40 MG/ML Outcome: tolerated well, no immediate complications  There was excellent flow of contrast producing a partial arthrogram of the hip. The patient did have relief of symptoms during the anesthetic phase of the injection. Procedure, treatment alternatives, risks and benefits explained, specific risks discussed. Consent was given by the patient. Immediately prior to procedure a time out was called to verify the correct patient, procedure, equipment, support staff and site/side marked as required. Patient was prepped and draped in the usual sterile fashion.          Clinical History: CLINICAL DATA:  Low back pain with symptoms persisting over 6 weeks of treatment. Pain radiates down the left leg   EXAM: MRI LUMBAR SPINE WITHOUT CONTRAST   TECHNIQUE: Multiplanar, multisequence MR imaging of the lumbar spine was performed. No intravenous contrast was administered.   COMPARISON:  Lumbar radiography 07/20/2023   FINDINGS: Segmentation:  5 lumbar type vertebrae   Alignment:  Mild L4-5 anterolisthesis.    Vertebrae: No fracture, evidence of discitis, or bone lesion. Marrow edema around both L4-5 facets to a mild degree. Marrow edema at the left L3-4 facet and endplate. Degenerative fatty marrow conversion at L5-S1 endplates.   Conus medullaris and cauda equina: Conus extends to the L1-2 level. Conus and cauda equina appear normal.   Paraspinal and other soft tissues: Scattered colonic diverticula. No perispinal mass. Mild periarticular edema around the L4-5 facets.   Disc levels:   L2-L3: Mild disc desiccation and circumferential bulge.   L3-L4: Disc narrowing and circumferential bulging. Degenerative facet spurring and ligamentum flavum thickening. Moderate left foraminal narrowing. A left foraminal to extraforaminal herniation is questioned on T1 and STIR imaging, which would impinge on the left L3 nerve root.   L4-L5: Degenerative facet spurring which is bulky with ligamentous thickening and bilateral facet joint effusion. The disc is desiccated and narrowed with bulging. Left foraminal protrusion and at least moderate foraminal stenosis. Mild spinal stenosis crowding both subarticular recesses.   L5-S1:Disc collapse and endplate degeneration with ridging and facet spurring eccentric to the left. Moderate left foraminal impingement. Right foraminal and spinal canal narrowing is mild.   IMPRESSION: 1. Lumbar spine degeneration especially at L3-4 and below with mild anterolisthesis and active facet arthritis at L4-5. Facet arthritis and endplate edema asymmetric to the left at L3-4. 2. Foraminal impingement on the left at L3-4 to L5-S1; question far lateral to foraminal herniation on the left at L3-4.     Electronically Signed   By: Tiburcio Pea M.D.   On: 10/27/2023 09:01     Objective:  VS:  HT:    WT:   BMI:     BP:(!) 146/72  HR:72bpm  TEMP: ( )  RESP:  Physical Exam   Imaging: No results found.

## 2023-11-10 NOTE — Progress Notes (Unsigned)
 Pain Scale   Average Pain 10        +Driver, -BT, -Dye Allergies.

## 2023-11-11 MED ORDER — TRIAMCINOLONE ACETONIDE 40 MG/ML IJ SUSP
40.0000 mg | INTRAMUSCULAR | Status: AC | PRN
  Administered 2023-11-10: 40 mg via INTRA_ARTICULAR

## 2023-11-11 MED ORDER — BUPIVACAINE HCL 0.25 % IJ SOLN
4.0000 mL | INTRAMUSCULAR | Status: AC | PRN
Start: 2023-11-10 — End: 2023-11-10
  Administered 2023-11-10: 4 mL via INTRA_ARTICULAR

## 2023-11-28 ENCOUNTER — Other Ambulatory Visit (HOSPITAL_COMMUNITY): Payer: Self-pay

## 2023-11-28 ENCOUNTER — Telehealth: Payer: Self-pay | Admitting: Pharmacy Technician

## 2023-11-28 ENCOUNTER — Encounter: Payer: Self-pay | Admitting: Pharmacist

## 2023-11-28 ENCOUNTER — Telehealth: Payer: Self-pay

## 2023-11-28 ENCOUNTER — Ambulatory Visit: Payer: Medicare Other | Attending: Student | Admitting: Pharmacist

## 2023-11-28 VITALS — BP 140/80 | HR 60

## 2023-11-28 DIAGNOSIS — I10 Essential (primary) hypertension: Secondary | ICD-10-CM | POA: Diagnosis not present

## 2023-11-28 NOTE — Telephone Encounter (Signed)
-----   Message from Gaylyn Keas sent at 11/28/2023 12:50 PM EDT ----- This patient was seen in Pharm D clinic today for HTN and said she was having some atypical CP that she has had before.  I was not notified of this (am DOD today in office).  Patient already sent home.  Please get her in with DOD tomorrow at any location

## 2023-11-28 NOTE — Assessment & Plan Note (Signed)
 Assessment and Plan: In office BP 140/80 heart rate 60 Home BP monitor validated today found out to be inaccurate  Tolerates current BP medications well without any side effects BMP was WNL post ARB initiation  Patient reports chest discomfort at rest which goes away if she lie down in certain positions the discomfort would go away  Tolerates amlodipine low dose much better than 5 mg daily dose, occasional lower leg swelling when on her feet for too long  Dose not want to make medication changes at this visit will get new BP monitor and will continue checking BP at home  Follow up with PharmD in 8 weeks  In future can consider adding low dose hydrochlorothiazide or up olmesartan dose to 40 mg daily

## 2023-11-28 NOTE — Telephone Encounter (Signed)
:   [id number] would this patient's insurance cover bp monitor?    hi! i ran a test claim for bp monitor and it said:

## 2023-11-28 NOTE — Progress Notes (Signed)
 Patient ID: AOI KOUNS                 DOB: March 18, 1946                      MRN: [id number]      HPI: Alyssa Jefferson is a 78 y.o. female referred by Dr. Duke Salvia to HTN clinic. PMH is significant for hypertension, CKD 3A, pericarditis, OSA intermittently on CPAP, CKD 3 AA, hyperlipidemia, and prior breast cancer.  LVEF 60-65% with normal diastolic function. Patient saw Dr.Afton Dec 03 reported her home BP ~130-140 range due to pain. Due to lower leg edema plan is to stop amlodipine and if renal function improves initiate long acting ARB At last visit with PharmD olmesartan 20 mg was added to amlodipine and amlodipine dose was reduced from 5 mg to 2.5 mg due to lower extremities swelling. Post olmesartan BMP was WNL.   Patient presented today for BP follow up. Reports her BP runs in 125-130/55-60 range at home. Reports she has been taking and tolerating BP medications well, lately she has been experiencing chest discomfort at rest which comes and go. Laying in certain position would help to get rid of the chest discomfort. She had 6-8 pieces of black licorice on past Saturday, fried told her they can rise BP so she had stopped eating. She is going to lots of stress at home does not want to discuss. Brought in home BP monitor for validation found out to be inaccurate.  In office BP is above the goal but does not want to change medication.  1st reading on home BP monitor 171/70 heart rate  2nd reading on home BP monitor 154/71 heart rate 50 1st reading on office cuff BP 142/80  2nd BP reading office Cuff BP 140/80 heart rate 60   Current HTN meds: amlodipine 2.5 mg daily, olmesartan 20 mg daily  Previously tried: Spironolactone - AKI and hyperkalemia.  BP goal: <130/80  Family History:  Relation Problem Comments  Mother (Deceased)   Father (Deceased) Lung cancer     Sister (Deceased)   Sister Breast cancer     Sister Ovarian cancer     Social History:  Alcohol: none  Tobacco  use: none  Recreational drug use: none   Diet: eats out twice a month  Does not snack on salty snacks  Drinks - water , occasional -soda, coffee or hot chocolate   Exercise: 10-15 min walks 4 days per week  Home BP readings:~124/53 need to validate home BP monitor    Wt Readings from Last 3 Encounters:  10/27/23 208 lb (94.3 kg)  07/26/23 204 lb 3.2 oz (92.6 kg)  07/19/23 203 lb 1.6 oz (92.1 kg)   BP Readings from Last 3 Encounters:  11/10/23 (!) 146/72  10/27/23 (!) 120/58  09/30/23 120/78   Pulse Readings from Last 3 Encounters:  11/10/23 72  10/27/23 (!) 53  09/30/23 (!) 57    Renal function: CrCl cannot be calculated (Patient's most recent lab result is older than the maximum 21 days allowed.).  Past Medical History:  Diagnosis Date   Arthritis    knees, back   Cancer (HCC) 2004   breast   Depression    GERD (gastroesophageal reflux disease)    Glaucoma    Heart palpitations    High cholesterol    History of transfusion    many years ago   Hypertension    Hypothyroidism  Itching in the vaginal area    Nocturia    OSA (obstructive sleep apnea)    tried CPAP- but unsuccessful   Renal disorder    Thyroid disease    thyroid removed    Current Outpatient Medications on File Prior to Visit  Medication Sig Dispense Refill   albuterol (VENTOLIN HFA) 108 (90 Base) MCG/ACT inhaler Inhale 2 puffs into the lungs every 6 (six) hours as needed for wheezing or shortness of breath. 1 Inhaler 0   amLODipine (NORVASC) 2.5 MG tablet Take 1 tablet (2.5 mg total) by mouth daily. 180 tablet 3   cholecalciferol (VITAMIN D3) 25 MCG (1000 UNIT) tablet Take 1,000 Units by mouth daily.     dorzolamide-timolol (COSOPT) 22.3-6.8 MG/ML ophthalmic solution Place 1 drop into both eyes 2 (two) times daily.     latanoprost (XALATAN) 0.005 % ophthalmic solution Place 1 drop into both eyes at bedtime. 2.5 mL 6   levothyroxine (SYNTHROID) 50 MCG tablet Take 1 tablet (50 mcg total) by  mouth daily. 90 tablet 3   olmesartan (BENICAR) 20 MG tablet Take 1 tablet (20 mg total) by mouth daily. 30 tablet 11   omeprazole (PRILOSEC) 40 MG capsule Take 40 mg by mouth daily as needed (acid reflux).     polyethylene glycol powder (GLYCOLAX/MIRALAX) 17 GM/SCOOP powder Take 17 g by mouth daily as needed (constipation).     Propylene Glycol (SYSTANE COMPLETE) 0.6 % SOLN Place 1 drop into both eyes as needed (dry eyes).     rosuvastatin (CRESTOR) 40 MG tablet Take 40 mg by mouth at bedtime.     traMADol (ULTRAM) 50 MG tablet Take 1 tablet (50 mg total) by mouth every 8 (eight) hours as needed. 20 tablet 0   No current facility-administered medications on file prior to visit.    Allergies  Allergen Reactions   Atorvastatin Calcium Itching   Codeine Other (See Comments)    confusion   Lipitor [Atorvastatin Calcium] Itching    There were no vitals taken for this visit.   Assessment/Plan:  1. Hypertension -  Essential hypertension Assessment and Plan: In office BP 140/80 heart rate 60 Home BP monitor validated today found out to be inaccurate  Tolerates current BP medications well without any side effects BMP was WNL post ARB initiation  Patient reports chest discomfort at rest which goes away if she lie down in certain positions the discomfort would go away  Tolerates amlodipine low dose much better than 5 mg daily dose, occasional lower leg swelling when on her feet for too long  Dose not want to make medication changes at this visit will get new BP monitor and will continue checking BP at home  Follow up with PharmD in 8 weeks  In future can consider adding low dose hydrochlorothiazide or up olmesartan dose to 40 mg daily    Thank you  Nickola Baron, Pharm.D [facility] A Division of [facility] [address] [place], [place] [address]  Phone: [phone]; Fax: [phone]

## 2023-11-28 NOTE — Telephone Encounter (Signed)
 Spoke with pt regarding an appointment. An appointment with DOD tomorrow in [place] was the only appointment available, however, pt did not want to drive to [facility]. Will forward to scheduling team. Pt verbalized understanding. All questions if any were answered.

## 2023-11-28 NOTE — Patient Instructions (Signed)
 No BP medication Changes made by your pharmacist Nickola Baron, PharmD at today's visit:    Bring all of your meds, your BP cuff and your record of home blood pressures to your next appointment.    HOW TO TAKE YOUR BLOOD PRESSURE AT HOME  Rest 5 minutes before taking your blood pressure.  Don't smoke or drink caffeinated beverages for at least 30 minutes before. Take your blood pressure before (not after) you eat. Sit comfortably with your back supported and both feet on the floor (don't cross your legs). Elevate your arm to heart level on a table or a desk. Use the proper sized cuff. It should fit smoothly and snugly around your bare upper arm. There should be enough room to slip a fingertip under the cuff. The bottom edge of the cuff should be 1 inch above the crease of the elbow. Ideally, take 3 measurements at one sitting and record the average.  Important lifestyle changes to control high blood pressure  Intervention  Effect on the BP  Lose extra pounds and watch your waistline Weight loss is one of the most effective lifestyle changes for controlling blood pressure. If you're overweight or obese, losing even a small amount of weight can help reduce blood pressure. Blood pressure might go down by about 1 millimeter of mercury (mm Hg) with each kilogram (about 2.2 pounds) of weight lost.  Exercise regularly As a general goal, aim for at least 30 minutes of moderate physical activity every day. Regular physical activity can lower high blood pressure by about 5 to 8 mm Hg.  Eat a healthy diet Eating a diet rich in whole grains, fruits, vegetables, and low-fat dairy products and low in saturated fat and cholesterol. A healthy diet can lower high blood pressure by up to 11 mm Hg.  Reduce salt (sodium) in your diet Even a small reduction of sodium in the diet can improve heart health and reduce high blood pressure by about 5 to 6 mm Hg.  Limit alcohol One drink equals 12 ounces of beer, 5  ounces of wine, or 1.5 ounces of 80-proof liquor.  Limiting alcohol to less than one drink a day for women or two drinks a day for men can help lower blood pressure by about 4 mm Hg.   If you have any questions or concerns please use My Chart to send questions or call the office at [phone]

## 2023-12-05 ENCOUNTER — Encounter: Payer: Self-pay | Admitting: Orthopaedic Surgery

## 2023-12-05 ENCOUNTER — Ambulatory Visit: Admitting: Orthopaedic Surgery

## 2023-12-05 VITALS — Ht 61.5 in | Wt 202.0 lb

## 2023-12-05 DIAGNOSIS — M25551 Pain in right hip: Secondary | ICD-10-CM

## 2023-12-05 DIAGNOSIS — M1611 Unilateral primary osteoarthritis, right hip: Secondary | ICD-10-CM | POA: Diagnosis not present

## 2023-12-05 NOTE — Progress Notes (Signed)
 The patient is a 78 year old female that I am seeing for the first time.  She was sent to me by my partner Dr. Daisey Dryer due to known arthritis in her right hip.  She just had an intra-articular steroid injection in that right hip under fluoroscopy by Dr. Daisey Dryer 3 weeks ago.  She says the injections actually done quite well for her.  On exam her right hip moves smoothly and fluidly with no pain in the groin today but she says that is where her pain has been the most.  She does have a BMI of 37.55 and she has a large soft tissue envelope around both hips.  She is walking without a limp.  I did go over her x-rays with her of her pelvis and both hips.  Her left hip appears normal.  That hip does move smoothly and fluidly on exam.  Her right hip has significant joint space narrowing.  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  At this point since she is doing well she wants to hold off on any type of other intervention.  She may end up needing hip replacement at some point.  I gave her a handout about hip replacement surgery and went over that with her.  If things worsen she knows to give us  a call.  She also needs to wait 5 to 6 months between steroid injections in her hip.  All question concerns were answered addressed.  For now follow-up is as needed.

## 2024-01-19 ENCOUNTER — Other Ambulatory Visit (HOSPITAL_BASED_OUTPATIENT_CLINIC_OR_DEPARTMENT_OTHER): Payer: Self-pay | Admitting: Cardiovascular Disease

## 2024-01-19 ENCOUNTER — Ambulatory Visit (HOSPITAL_BASED_OUTPATIENT_CLINIC_OR_DEPARTMENT_OTHER): Admitting: Cardiovascular Disease

## 2024-01-19 ENCOUNTER — Encounter (HOSPITAL_BASED_OUTPATIENT_CLINIC_OR_DEPARTMENT_OTHER): Payer: Self-pay | Admitting: Cardiovascular Disease

## 2024-01-19 ENCOUNTER — Other Ambulatory Visit (HOSPITAL_BASED_OUTPATIENT_CLINIC_OR_DEPARTMENT_OTHER)

## 2024-01-19 VITALS — BP 136/74 | HR 52 | Ht 62.0 in | Wt 209.2 lb

## 2024-01-19 DIAGNOSIS — I319 Disease of pericardium, unspecified: Secondary | ICD-10-CM

## 2024-01-19 DIAGNOSIS — R002 Palpitations: Secondary | ICD-10-CM

## 2024-01-19 DIAGNOSIS — R079 Chest pain, unspecified: Secondary | ICD-10-CM

## 2024-01-19 DIAGNOSIS — G4733 Obstructive sleep apnea (adult) (pediatric): Secondary | ICD-10-CM | POA: Diagnosis not present

## 2024-01-19 DIAGNOSIS — E782 Mixed hyperlipidemia: Secondary | ICD-10-CM | POA: Diagnosis not present

## 2024-01-19 DIAGNOSIS — Z6841 Body Mass Index (BMI) 40.0 and over, adult: Secondary | ICD-10-CM

## 2024-01-19 DIAGNOSIS — I10 Essential (primary) hypertension: Secondary | ICD-10-CM

## 2024-01-19 MED ORDER — OLMESARTAN MEDOXOMIL 40 MG PO TABS: 40.0000 mg | ORAL_TABLET | Freq: Every day | ORAL | 3 refills | Status: AC

## 2024-01-19 NOTE — Patient Instructions (Addendum)
 Medication Instructions:  STOP AMLODIPINE    INCREASE OLMESARTAN  TO 40 MG DAILY   *If you need a refill on your cardiac medications before your next appointment, please call your pharmacy*  Lab Work: FASTING LP/CMET IN 1 WEEK   If you have labs (blood work) drawn today and your tests are completely normal, you will receive your results only by: MyChart Message (if you have MyChart) OR A paper copy in the mail If you have any lab test that is abnormal or we need to change your treatment, we will call you to review the results.  Testing/Procedures: Your physician has requested that you have a lexiscan myoview. For further information please visit [email or web address] Please follow instruction sheet, as given.  7 DAY ZIO   Follow-Up: At [facility], you and your health needs are our priority.  As part of our continuing mission to provide you with exceptional heart care, our providers are all part of one team.  This team includes your primary Cardiologist (physician) and Advanced Practice Providers or APPs (Physician Assistants and Nurse Practitioners) who all work together to provide you with the care you need, when you need it.  Your next appointment:   2 TO 3 month(s)  Provider:   Slater Duncan, NP or Neomi Banks, NP     We recommend signing up for the patient portal called "MyChart".  Sign up information is provided on this After Visit Summary.  MyChart is used to connect with patients for Virtual Visits (Telemedicine).  Patients are able to view lab/test results, encounter notes, upcoming appointments, etc.  Non-urgent messages can be sent to your provider as well.   To learn more about what you can do with MyChart, go to [email or web address].   Other Instructions  LEXISCAN MYOVIEW   Please arrive 15 minutes prior to your appointment time for registration and insurance purposes.   The test will take approximately 3 to 4 hours to complete; you may bring  reading material.  If someone comes with you to your appointment, they will need to remain in the main lobby due to limited space in the testing area. **If you are pregnant or breastfeeding, please notify the nuclear lab prior to your appointment**   How to prepare for your Myocardial Perfusion Test: Do not eat or drink 3 hours prior to your test, except you may have water. Do not consume products containing caffeine (regular or decaffeinated) 12 hours prior to your test. (ex: coffee, chocolate, sodas, tea). Do wear comfortable clothes (no dresses or overalls) and walking shoes, tennis shoes preferred (No heels or open toe shoes are allowed). Do NOT wear cologne, perfume, aftershave, or lotions (deodorant is allowed). If you use an inhaler, use it the AM of your test and bring it with you.  If you use a nebulizer, use it the AM of your test.  If these instructions are not followed, your test will have to be rescheduled.  ZIO XT- Long Term Monitor Instructions  Your physician has requested you wear a ZIO patch monitor for 7 days.  This is a single patch monitor. [employer] supplies one patch monitor per enrollment. Additional stickers are not available. Please do not apply patch if you will be having a Nuclear Stress Test,  Echocardiogram, Cardiac CT, MRI, or Chest Xray during the period you would be wearing the  monitor. The patch cannot be worn during these tests. You cannot remove and re-apply the  ZIO XT patch monitor.  Your ZIO patch monitor will  be mailed 3 day USPS to your address on file. It may take 3-5 days  to receive your monitor after you have been enrolled.  Once you have received your monitor, please review the enclosed instructions. Your monitor  has already been registered assigning a specific monitor serial # to you.  Billing and Patient Assistance Program Information  We have supplied [employer] with any of your insurance information on file for billing purposes. [employer] offers  a sliding scale Patient Assistance Program for patients that do not have  insurance, or whose insurance does not completely cover the cost of the ZIO monitor.  You must apply for the Patient Assistance Program to qualify for this discounted rate.  To apply, please call [employer] at [phone], select option 4, select option 2, ask to apply for  Patient Assistance Program. Sanna Crystal will ask your household income, and how many people  are in your household. They will quote your out-of-pocket cost based on that information.  [employer] will also be able to set up a 8-month, interest-free payment plan if needed.  Applying the monitor   Shave hair from upper left chest.  Hold abrader disc by orange tab. Rub abrader in 40 strokes over the upper left chest as  indicated in your monitor instructions.  Clean area with 4 enclosed alcohol pads. Let dry.  Apply patch as indicated in monitor instructions. Patch will be placed under collarbone on left  side of chest with arrow pointing upward.  Rub patch adhesive wings for 2 minutes. Remove white label marked "1". Remove the white  label marked "2". Rub patch adhesive wings for 2 additional minutes.  While looking in a mirror, press and release button in center of patch. A small green light will  flash 3-4 times. This will be your only indicator that the monitor has been turned on.  Do not shower for the first 24 hours. You may shower after the first 24 hours.  Press the button if you feel a symptom. You will hear a small click. Record Date, Time and  Symptom in the Patient Logbook.  When you are ready to remove the patch, follow instructions on the last 2 pages of Patient  Logbook. Stick patch monitor onto the last page of Patient Logbook.  Place Patient Logbook in the blue and white box. Use locking tab on box and tape box closed  securely. The blue and white box has prepaid postage on it. Please place it in the mailbox as  soon as possible. Your  physician should have your test results approximately 7 days after the  monitor has been mailed back to [facility].  Call [facility] Customer Care at [phone] if you have questions regarding  your ZIO XT patch monitor. Call them immediately if you see an orange light blinking on your  monitor.  If your monitor falls off in less than 4 days, contact our Monitor department at [phone].  If your monitor becomes loose or falls off after 4 days call [employer] at [phone] for  suggestions on securing your monitor

## 2024-01-19 NOTE — Progress Notes (Signed)
 Cardiology Office Note:  .   Date:  01/19/2024  ID:  Alyssa Jefferson, DOB [birth year], MRN [id number] PCP: Ulysees Gander, MD  [facility] Providers Cardiologist:  Maudine Sos, MD PV Cardiologist:  Antionette Kirks, MD    History of Present Illness: Alyssa Jefferson is a 78 y.o. female with hypertension, CKD 3A, pericarditis, OSA intermittently on CPAP, CKD 3 AA, hyperlipidemia, and prior breast cancer here for follow-up.  She saw Dr. Louana Roup 03/2022 with chest pain.  She was noted to have a murmur on exam and symptoms were pleuritic.  Echo revealed LVEF 60-65% with normal diastolic function.  There is no pericardial effusion.  A chest RP was 35.1.  She was treated with ibuprofen  and colchicine .  However she did not take the ibuprofen  due to concerns for chronic kidney disease.  She followed up with Dr. Louana Roup 07/2022 and was doing well.  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  She saw Dr. Alvenia Aus who felt that her symptoms were more attributable to sciatica than PAD. Carotid Dopplers revealed 1-39% stenosis bilaterally.  SHe was referred to orthopedics.   At her visit 07/2023 amlodipine  was discontinued 2/2 edema and she was started on olmesartan . However, she has continued on amlodipine .    Discussed the use of AI scribe software for clinical note transcription with the patient, who gave verbal consent to proceed.  History of Present Illness Alyssa Jefferson experiences sharp, intermittent chest pain that occurs while sitting and lying down, but not during walking, which is her primary form of exercise. The episodes last about a minute and are not associated with shortness of breath. She has a history of acid reflux but has not been using her Prilosec regularly.  She describes palpitations as a fluttering sensation in her heart, occurring primarily in the morning and sometimes at night. These episodes last  from half an hour to longer, depending on her position. She associates these palpitations with her medication, specifically Synthroid  and Levemir, noting that they sometimes trigger the fluttering. She has experienced these palpitations before, but they were particularly noticeable this morning.  Her blood pressure has been generally well-controlled, though she noted a reading of 140 mmHg today. She is currently on amlodipine  and olmesartan  for hypertension, and a statin for cholesterol management. She mentions previous swelling in her feet, which she attributes to her medication regimen. She recalls a recent cholesterol check indicating slightly elevated levels, though specific details are not provided.  ROS:  As per HPI  Studies Reviewed: .       Echo 06/2023:    1. Left ventricular ejection fraction, by estimation, is 60 to 65%. The  left ventricle has normal function. The left ventricle has no regional  wall motion abnormalities. Left ventricular diastolic parameters are  consistent with Grade I diastolic  dysfunction (impaired relaxation). The average left ventricular global  longitudinal strain is -19.2 %. The global longitudinal strain is normal.   2. Right ventricular systolic function is normal. The right ventricular  size is normal.   3. The mitral valve is normal in structure. No evidence of mitral valve  regurgitation. No evidence of mitral stenosis.   4. The aortic valve is tricuspid. Aortic valve regurgitation is not  visualized. No aortic stenosis is present.   5. The inferior vena cava is normal in size with greater than 50%  respiratory variability, suggesting right atrial  pressure of 3 mmHg.   Risk Assessment/Calculations:             Physical Exam:   VS:  BP 136/74   Pulse (!) 52   Ht 5\' 2"  (1.575 m)   Wt 209 lb 3.2 oz (94.9 kg)   BMI 38.26 kg/m  , BMI Body mass index is 38.26 kg/m. GENERAL:  Well appearing HEENT: Pupils equal round and reactive, fundi not  visualized, oral mucosa unremarkable NECK:  No jugular venous distention, waveform within normal limits, carotid upstroke brisk and symmetric, no bruits, no thyromegaly LUNGS:  Clear to auscultation bilaterally HEART:  RRR.  PMI not displaced or sustained,S1 and S2 within normal limits, no S3, no S4, no clicks, no rubs, no murmurs ABD:  Flat, positive bowel sounds normal in frequency in pitch, no bruits, no rebound, no guarding, no midline pulsatile mass, no hepatomegaly, no splenomegaly EXT:  2 plus pulses throughout, no edema, no cyanosis no clubbing SKIN:  No rashes no nodules NEURO:  Cranial nerves II through XII grossly intact, motor grossly intact throughout PSYCH:  Cognitively intact, oriented to person place and time  ASSESSMENT AND PLAN: .    Assessment & Plan # Palpitations Palpitations possibly linked to Synthroid  dose change or untreated sleep apnea. Consider arrhythmia due to oxygen desaturation from sleep apnea. - Order a 7-day Holter monitor. - Discuss potential link between untreated sleep apnea and palpitations.  # Chest pain Intermittent sharp chest pain likely non-cardiac, possibly GERD-related. No exertional component. - Order a pharmacologic stress test. - Resume Prilosec 30 mg daily, 30 minutes before breakfast.  # Hypertension Blood pressure generally well-controlled, recent reading 140 mmHg. Peripheral edema noted, likely due to amlodipine . - Discontinue amlodipine . - Increase olmesartan  to 40 mg daily. - Provide a blood pressure tracking sheet. - Reassess blood pressure in two months. - Check labs in one week post-dose adjustment to monitor potassium levels.  # Peripheral edema Peripheral edema likely related to amlodipine  use. - Discontinue amlodipine . - Increase olmesartan  to 40 mg daily. - Monitor for improvement in edema.  # Hyperlipidemia Cholesterol slightly elevated, no recent lab results available. - Request cholesterol records from Dr. Bernetta Brilliant. -  Check fasting cholesterol levels in one week.  # Acid reflux Acid reflux possibly contributing to chest pain. - Initiate Prilosec 30 mg daily, 30 minutes before breakfast. - Monitor for improvement in chest pain symptoms.      Informed Consent   Shared Decision Making/Informed Consent The risks [chest pain, shortness of breath, cardiac arrhythmias, dizziness, blood pressure fluctuations, myocardial infarction, stroke/transient ischemic attack, nausea, vomiting, allergic reaction, radiation exposure, metallic taste sensation and life-threatening complications (estimated to be 1 in 10,000)], benefits (risk stratification, diagnosing coronary artery disease, treatment guidance) and alternatives of a nuclear stress test were discussed in detail with Ms. Yetter and she agrees to proceed.     Dispo: f/u in 2-3 months   Signed, Maudine Sos, MD

## 2024-01-23 ENCOUNTER — Telehealth (HOSPITAL_COMMUNITY): Payer: Self-pay

## 2024-01-23 NOTE — Telephone Encounter (Signed)
 Detailed instructions left on the patient's answering machine. S.Jassiel Flye CCT

## 2024-01-23 NOTE — Telephone Encounter (Signed)
 Spoke with patient, she'll be here for her test. S.Dalma Panchal CCT

## 2024-01-24 ENCOUNTER — Ambulatory Visit (HOSPITAL_COMMUNITY)
Admission: RE | Admit: 2024-01-24 | Discharge: 2024-01-24 | Disposition: A | Discharge status: Home / Self Care | Source: Ambulatory Visit | Attending: Cardiovascular Disease | Admitting: Cardiovascular Disease

## 2024-01-24 DIAGNOSIS — R079 Chest pain, unspecified: Secondary | ICD-10-CM | POA: Diagnosis present

## 2024-01-24 DIAGNOSIS — R002 Palpitations: Secondary | ICD-10-CM | POA: Insufficient documentation

## 2024-01-24 DIAGNOSIS — I10 Essential (primary) hypertension: Secondary | ICD-10-CM | POA: Insufficient documentation

## 2024-01-24 MED ORDER — TECHNETIUM TC 99M TETROFOSMIN IV KIT
10.2000 | PACK | Freq: Once | INTRAVENOUS | Status: AC | PRN
  Administered 2024-01-24: 10.2 via INTRAVENOUS

## 2024-01-24 MED ORDER — REGADENOSON 0.4 MG/5ML IV SOLN
0.4000 mg | Freq: Once | INTRAVENOUS | Status: AC
  Administered 2024-01-24: 0.4 mg via INTRAVENOUS

## 2024-01-24 MED ORDER — REGADENOSON 0.4 MG/5ML IV SOLN
INTRAVENOUS | Status: AC
  Filled 2024-01-24: qty 5

## 2024-01-24 MED ORDER — TECHNETIUM TC 99M TETROFOSMIN IV KIT
31.7000 | PACK | Freq: Once | INTRAVENOUS | Status: AC | PRN
  Administered 2024-01-24: 31.7 via INTRAVENOUS

## 2024-01-26 LAB — MYOCARDIAL PERFUSION IMAGING
LV dias vol: 98 mL (ref 46–106)
LV sys vol: 21 mL
Nuc Stress EF: 79 %
Peak HR: 70 {beats}/min
Rest HR: 57 {beats}/min
Rest Nuclear Isotope Dose: 10.2 mCi
SDS: 0
SRS: 0
SSS: 0
ST Depression (mm): 0 mm
Stress Nuclear Isotope Dose: 31.7 mCi

## 2024-01-27 ENCOUNTER — Ambulatory Visit: Payer: Self-pay | Admitting: Cardiovascular Disease

## 2024-01-30 ENCOUNTER — Ambulatory Visit: Attending: Pharmacist | Admitting: Pharmacist

## 2024-01-30 NOTE — Progress Notes (Deleted)
 Patient ID: Alyssa Jefferson                 DOB: [birth year]                      MRN: [id number]      HPI: Alyssa Jefferson is a 78 y.o. female referred by Dr. Theodis Fiscal to HTN clinic. PMH is significant for hypertension, CKD 3A, pericarditis, OSA intermittently on CPAP, CKD 3 AA, hyperlipidemia, and prior breast cancer.  LVEF 60-65% with normal diastolic function. Patient saw Dr.[place] Dec 03 reported her home BP ~130-140 range due to pain. Due to lower leg edema plan is to stop amlodipine  and if renal function improves initiate long acting ARB At last visit with PharmD olmesartan  20 mg was added to amlodipine  and amlodipine  dose was reduced from 5 mg to 2.5 mg due to lower extremities swelling. Post olmesartan  BMP was WNL. Pt olmesartan  dose was increased to 40 mg and BMP post dose titration is due    Current HTN meds: olmesartan  20 mg daily  Previously tried: Spironolactone  - AKI and hyperkalemia.  BP goal: <130/80  Family History:  Relation Problem Comments  Mother (Deceased)   Father (Deceased) Lung cancer     Sister (Deceased)   Sister Breast cancer     Sister Ovarian cancer     Social History:  Alcohol: none  Tobacco use: none  Recreational drug use: none   Diet: eats out twice a month  Does not snack on salty snacks  Drinks - water , occasional -soda, coffee or hot chocolate   Exercise: 10-15 min walks 4 days per week  Home BP readings:~124/53 need to validate home BP monitor    Wt Readings from Last 3 Encounters:  01/24/24 209 lb (94.8 kg)  01/19/24 209 lb 3.2 oz (94.9 kg)  12/05/23 202 lb (91.6 kg)   BP Readings from Last 3 Encounters:  01/19/24 136/74  11/28/23 (!) 140/80  11/10/23 (!) 146/72   Pulse Readings from Last 3 Encounters:  01/19/24 (!) 52  11/28/23 60  11/10/23 72    Renal function: CrCl cannot be calculated (Patient's most recent lab result is older than the maximum 21 days allowed.).  Past Medical History:  Diagnosis Date   Arthritis     knees, back   Cancer (HCC) 2004   breast   Depression    GERD (gastroesophageal reflux disease)    Glaucoma    Heart palpitations    High cholesterol    History of transfusion    many years ago   Hypertension    Hypothyroidism    Itching in the vaginal area    Nocturia    OSA (obstructive sleep apnea)    tried CPAP- but unsuccessful   Renal disorder    Thyroid  disease    thyroid  removed    Current Outpatient Medications on File Prior to Visit  Medication Sig Dispense Refill   albuterol  (VENTOLIN  HFA) 108 (90 Base) MCG/ACT inhaler Inhale 2 puffs into the lungs every 6 (six) hours as needed for wheezing or shortness of breath. 1 Inhaler 0   cholecalciferol (VITAMIN D3) 25 MCG (1000 UNIT) tablet Take 1,000 Units by mouth daily.     dorzolamide-timolol (COSOPT) 22.3-6.8 MG/ML ophthalmic solution Place 1 drop into both eyes 2 (two) times daily.     latanoprost  (XALATAN ) 0.005 % ophthalmic solution Place 1 drop into both eyes at bedtime. 2.5 mL 6   levothyroxine  (SYNTHROID ) 50 MCG  tablet Take 1 tablet (50 mcg total) by mouth daily. 90 tablet 3   olmesartan  (BENICAR ) 40 MG tablet Take 1 tablet (40 mg total) by mouth daily. 90 tablet 3   omeprazole  (PRILOSEC) 40 MG capsule Take 40 mg by mouth daily as needed (acid reflux).     polyethylene glycol powder (GLYCOLAX /MIRALAX ) 17 GM/SCOOP powder Take 17 g by mouth daily as needed (constipation).     Propylene Glycol (SYSTANE COMPLETE) 0.6 % SOLN Place 1 drop into both eyes as needed (dry eyes).     rosuvastatin  (CRESTOR ) 40 MG tablet Take 40 mg by mouth at bedtime.     traMADol  (ULTRAM ) 50 MG tablet Take 1 tablet (50 mg total) by mouth every 8 (eight) hours as needed. 20 tablet 0   No current facility-administered medications on file prior to visit.    Allergies  Allergen Reactions   Atorvastatin  Calcium  Itching   Codeine Other (See Comments)    confusion   Lipitor [Atorvastatin  Calcium ] Itching    There were no vitals taken for  this visit.   Assessment/Plan:  1. Hypertension -  No problem-specific Assessment & Plan notes found for this encounter.  Thank you  Nickola Baron, Pharm.D [facility] A Division of [facility] [address] [place], [place] [address]  Phone: [phone]; Fax: [phone]

## 2024-01-31 LAB — COMPREHENSIVE METABOLIC PANEL WITH GFR
ALT: 13 IU/L (ref 0–32)
AST: 17 IU/L (ref 0–40)
Albumin: 3.9 g/dL (ref 3.8–4.8)
Alkaline Phosphatase: 84 IU/L (ref 44–121)
BUN/Creatinine Ratio: 23 (ref 12–28)
BUN: 37 mg/dL — ABNORMAL HIGH (ref 8–27)
Bilirubin Total: 0.5 mg/dL (ref 0.0–1.2)
CO2: 18 mmol/L — ABNORMAL LOW (ref 20–29)
Calcium: 9.1 mg/dL (ref 8.7–10.3)
Chloride: 109 mmol/L — ABNORMAL HIGH (ref 96–106)
Creatinine, Ser: 1.64 mg/dL — ABNORMAL HIGH (ref 0.57–1.00)
Globulin, Total: 2.4 g/dL (ref 1.5–4.5)
Glucose: 87 mg/dL (ref 70–99)
Potassium: 5.3 mmol/L — ABNORMAL HIGH (ref 3.5–5.2)
Sodium: 141 mmol/L (ref 134–144)
Total Protein: 6.3 g/dL (ref 6.0–8.5)
eGFR: 32 mL/min/{1.73_m2} — ABNORMAL LOW (ref >59)

## 2024-01-31 LAB — LIPID PANEL
Chol/HDL Ratio: 2.5 ratio (ref 0.0–4.4)
Cholesterol, Total: 186 mg/dL (ref 100–199)
HDL: 73 mg/dL (ref >39)
LDL Chol Calc (NIH): 100 mg/dL — ABNORMAL HIGH (ref 0–99)
Triglycerides: 69 mg/dL (ref 0–149)
VLDL Cholesterol Cal: 13 mg/dL (ref 5–40)

## 2024-02-08 ENCOUNTER — Other Ambulatory Visit: Payer: Self-pay | Admitting: Physical Medicine and Rehabilitation

## 2024-02-09 ENCOUNTER — Ambulatory Visit: Payer: Self-pay | Admitting: Cardiovascular Disease

## 2024-02-15 ENCOUNTER — Other Ambulatory Visit: Payer: Self-pay | Admitting: Orthopaedic Surgery

## 2024-02-15 ENCOUNTER — Telehealth: Payer: Self-pay | Admitting: Physical Medicine and Rehabilitation

## 2024-02-15 DIAGNOSIS — R002 Palpitations: Secondary | ICD-10-CM | POA: Diagnosis not present

## 2024-02-15 DIAGNOSIS — R079 Chest pain, unspecified: Secondary | ICD-10-CM

## 2024-02-15 MED ORDER — TRAMADOL HCL 50 MG PO TABS: 50.0000 mg | ORAL_TABLET | Freq: Three times a day (TID) | ORAL | 0 refills | Status: AC | PRN

## 2024-02-15 NOTE — Telephone Encounter (Signed)
 Pt called requesting an appt sooner than the end of month. States she is going out of town and need her medication. Please call pt about this matter at [phone].

## 2024-02-24 ENCOUNTER — Ambulatory Visit (HOSPITAL_BASED_OUTPATIENT_CLINIC_OR_DEPARTMENT_OTHER): Admitting: Student

## 2024-03-19 ENCOUNTER — Other Ambulatory Visit: Payer: Self-pay | Admitting: Family Medicine

## 2024-03-19 DIAGNOSIS — Z1231 Encounter for screening mammogram for malignant neoplasm of breast: Secondary | ICD-10-CM

## 2024-04-04 ENCOUNTER — Ambulatory Visit

## 2024-04-04 ENCOUNTER — Ambulatory Visit
Admission: RE | Admit: 2024-04-04 | Discharge: 2024-04-04 | Disposition: A | Discharge status: Home / Self Care | Source: Ambulatory Visit | Attending: Family Medicine | Admitting: Family Medicine

## 2024-04-04 DIAGNOSIS — Z1231 Encounter for screening mammogram for malignant neoplasm of breast: Secondary | ICD-10-CM

## 2024-04-30 ENCOUNTER — Ambulatory Visit (HOSPITAL_BASED_OUTPATIENT_CLINIC_OR_DEPARTMENT_OTHER): Admitting: Family

## 2024-04-30 ENCOUNTER — Encounter (HOSPITAL_BASED_OUTPATIENT_CLINIC_OR_DEPARTMENT_OTHER): Payer: Self-pay | Admitting: Family

## 2024-04-30 VITALS — BP 128/72 | HR 70 | Resp 16 | Ht 62.0 in | Wt 210.2 lb

## 2024-04-30 DIAGNOSIS — I739 Peripheral vascular disease, unspecified: Secondary | ICD-10-CM

## 2024-04-30 DIAGNOSIS — Z8679 Personal history of other diseases of the circulatory system: Secondary | ICD-10-CM | POA: Diagnosis not present

## 2024-04-30 DIAGNOSIS — I10 Essential (primary) hypertension: Secondary | ICD-10-CM | POA: Diagnosis not present

## 2024-04-30 DIAGNOSIS — G4733 Obstructive sleep apnea (adult) (pediatric): Secondary | ICD-10-CM | POA: Diagnosis not present

## 2024-04-30 NOTE — Progress Notes (Signed)
 Cardiology Office Note:  .   Date:  04/30/2024  ID:  Alyssa Jefferson, DOB 1946-08-16, MRN [id number] PCP: Maree Leni Edyth DELENA, MD  Nephrology: Dr. Tob[facility] Providers Cardiologist:  Annabella Scarce, MD PV Cardiologist:  Deatrice Cage, MD    History of Present Illness: Alyssa Jefferson is a 78 y.o. female with history of pericarditis, HTN, CKD3b, OSA with intermittent CPAP use, prior breast CA, HLD, suspected PAD, thyroid  disease.  Evaluated by Dr. Arnetha August 2023 with chest pain felt to be pericarditis.  Treated with ibuprofen  short course and colchicine  0.6 mg daily for 3 months. 2D echo 04/2022 LVEF suggested 5%, otherwise unremarkable.  At follow-up 09/2021 symptoms had resolved and colchicine  discontinued.  Inflammatory markers still elevated so she was recommended follow-up with PCP for further evaluation of chronic inflammation.  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 She was referred to peripheral vascular team.   Seen by Raphael Bring, PA 06/23/23. Notes in April chest pain started to recur but subsided to 2-3x per week in left chest and shoulder and resolved spontaneously. Not felt to be cardiac nor pericarditis. TSH, CK, ANA, rheumatoid factor were unremarkable. CRP mildly elevated at 23, but similar to previous.  Due to creatinine 2.17, K5.8 with baseline creatinine 1.5 she was recommended for evaluation in the ED.  Spironolactone  was discontinued.  At Dr. Scarce direction, diltiazem  was transition to amlodipine  for better blood pressure control.  She was last seen 01/19/2024 by Dr. Scarce.  7-day monitor ordered due to palpitations which were possibly linked to Synthroid  dose change.  This revealed predominantly normal sinus rhythm and less than 1% PAC/PVC burden.  Due to intermittent chest pain stress  test performed 01/24/2024 with no evidence of ischemia, normal LVEF 79%.  Due to peripheral edema amlodipine  discontinued and olmesartan  increased to 40 mg daily.  Presents today for follow up independently. One episode of palpitations last month which independently resolved. Reports her ankle edema might be a little better since stopping Amlodipine . Unable to check blood pressure at home as her machine broke. Prior to that time she was getting 120s-130s. She has been unable to tolerate CPAP even with trying different masks. Discussed possible referral to ENT for [facility], she will let us  know if interested. 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 Reports single episode of chest pain which independently resolved. Reviewed reassuring cardiac testing.   ROS: Please see the history of present illness.    All other systems reviewed and are negative.   Studies Reviewed: .        Cardiac Studies & Procedures   ______________________________________________________________________________________________   STRESS TESTS  MYOCARDIAL PERFUSION IMAGING 01/24/2024  Interpretation Summary   The study is normal. The study is low risk.   No ST deviation was noted.   LV perfusion is normal. There is no evidence of ischemia. There is no evidence of infarction.   Left ventricular function is normal. Nuclear stress EF: 79%. The left ventricular ejection fraction is normal (55-65%). End diastolic cavity size is normal. End systolic cavity size is normal.   CT images were obtained for attenuation correction and were examined for the presence of coronary calcium  when appropriate.   Coronary calcium  was absent on the attenuation correction CT images.   Prior study available for comparison from 09/01/2012.  ECHOCARDIOGRAM  ECHOCARDIOGRAM COMPLETE 07/01/2023  Narrative ECHOCARDIOGRAM REPORT    Patient Name:   Alyssa Jefferson Date of Exam: 07/01/2023 Medical Rec #:  [id number]      Height:        62.0 in Accession #:    [id number]     Weight:       200.6 lb Date of Birth:  11/15/1945      BSA:          1.914 m Patient Age:    77 years       BP:           143/58 mmHg Patient Gender: F              HR:           69 bpm. Exam Location:  Outpatient  Procedure: 2D Echo, 3D Echo, Color Doppler, Cardiac Doppler and Strain Analysis  Indications:    Pericardial Effusion  History:        Patient has prior history of Echocardiogram examinations, most recent 04/20/2022. Signs/Symptoms:Murmur; Risk Factors:Former Smoker, Hypertension and Dyslipidemia. Pericarditis.  Sonographer:    Orvil Holmes RDCS Referring Phys: [id number] Kaelem Brach S Cyndal Kasson  IMPRESSIONS   1. Left ventricular ejection fraction, by estimation, is 60 to 65%. The left ventricle has normal function. The left ventricle has no regional wall motion abnormalities. Left ventricular diastolic parameters are consistent with Grade I diastolic dysfunction (impaired relaxation). The average left ventricular global longitudinal strain is -19.2 %. The global longitudinal strain is normal. 2. Right ventricular systolic function is normal. The right ventricular size is normal. 3. The mitral valve is normal in structure. No evidence of mitral valve regurgitation. No evidence of mitral stenosis. 4. The aortic valve is tricuspid. Aortic valve regurgitation is not visualized. No aortic stenosis is present. 5. The inferior vena cava is normal in size with greater than 50% respiratory variability, suggesting right atrial pressure of 3 mmHg.  FINDINGS Left Ventricle: Left ventricular ejection fraction, by estimation, is 60 to 65%. The left ventricle has normal function. The left ventricle has no regional wall motion abnormalities. The average left ventricular global longitudinal strain is -19.2 %. The global longitudinal strain is normal. The left ventricular internal cavity size was normal in size. There is no left ventricular hypertrophy. Left  ventricular diastolic parameters are consistent with Grade I diastolic dysfunction (impaired relaxation).  Right Ventricle: The right ventricular size is normal. No increase in right ventricular wall thickness. Right ventricular systolic function is normal.  Left Atrium: Left atrial size was normal in size.  Right Atrium: Right atrial size was normal in size.  Pericardium: There is no evidence of pericardial effusion.  Mitral Valve: The mitral valve is normal in structure. No evidence of mitral valve regurgitation. No evidence of mitral valve stenosis.  Tricuspid Valve: The tricuspid valve is normal in structure. Tricuspid valve regurgitation is trivial. No evidence of tricuspid stenosis.  Aortic Valve: The aortic valve is tricuspid. Aortic valve regurgitation is not visualized. No aortic stenosis is present.  Pulmonic Valve: The pulmonic valve was normal in structure. Pulmonic valve regurgitation is not visualized. No evidence of pulmonic stenosis.  Aorta: The aortic root is normal in size and structure.  Venous: The inferior vena cava is normal in size with greater than 50% respiratory variability, suggesting right atrial pressure of 3 mmHg.  IAS/Shunts: No atrial level shunt detected by color flow Doppler.   LEFT VENTRICLE PLAX 2D LVIDd:  4.10 cm   Diastology LVIDs:         2.06 cm   LV e' medial:    6.20 cm/s LV PW:         1.65 cm   LV E/e' medial:  10.5 LV IVS:        1.15 cm   LV e' lateral:   10.80 cm/s LVOT diam:     1.90 cm   LV E/e' lateral: 6.0 LV SV:         57 LV SV Index:   30        2D Longitudinal Strain LVOT Area:     2.84 cm  2D Strain GLS Avg:     -19.2 %  3D Volume EF: 3D EF:        61 % LV EDV:       78 ml LV ESV:       30 ml LV SV:        48 ml  RIGHT VENTRICLE RV Basal diam:  3.98 cm RV Mid diam:    3.04 cm RV S prime:     15.80 cm/s TAPSE (M-mode): 3.3 cm  LEFT ATRIUM             Index        RIGHT ATRIUM           Index LA diam:         3.40 cm 1.78 cm/m   RA Area:     10.60 cm LA Vol (A2C):   29.5 ml 15.41 ml/m  RA Volume:   24.50 ml  12.80 ml/m LA Vol (A4C):   42.5 ml 22.20 ml/m LA Biplane Vol: 37.8 ml 19.75 ml/m AORTIC VALVE LVOT Vmax:   93.20 cm/s LVOT Vmean:  61.000 cm/s LVOT VTI:    0.201 m  AORTA Ao Root diam: 3.00 cm Ao Asc diam:  3.00 cm  MITRAL VALVE MV Area (PHT): 3.08 cm    SHUNTS MV Decel Time: 246 msec    Systemic VTI:  0.20 m MV E velocity: 65.10 cm/s  Systemic Diam: 1.90 cm MV A velocity: 93.80 cm/s MV E/A ratio:  0.69  Maude Emmer MD Electronically signed by Maude Emmer MD Signature Date/Time: 07/01/2023/2:27:04 PM    Final    MONITORS  LONG TERM MONITOR (3-14 DAYS) 02/07/2024  Narrative 7 Day Zio Monitor  Quality: Fair.  Baseline artifact. Predominant rhythm: sinus rhythm Average heart rate: 62 bpm Max heart rate: 105 bpm Min heart rate: 46 bpm Pauses >2.5 seconds: none  Rare (<1%) PACs and PVCs   Tiffany C. Raford, MD, [facility] 02/15/2024 1:28 PM       ______________________________________________________________________________________________        Risk Assessment/Calculations:            Physical Exam:   VS:  BP 128/72 (BP Location: Right Arm, Patient Position: Sitting, Cuff Size: Normal)   Pulse 70   Resp 16   Ht 5' 2 (1.575 m)   Wt 210 lb 3.2 oz (95.3 kg)   SpO2 97%   BMI 38.45 kg/m    Wt Readings from Last 3 Encounters:  04/30/24 210 lb 3.2 oz (95.3 kg)  01/24/24 209 lb (94.8 kg)  01/19/24 209 lb 3.2 oz (94.9 kg)    GEN: Well nourished, well developed in no acute distress NECK: No JVD; No carotid bruits CARDIAC: RRR, no murmurs, rubs, gallops RESPIRATORY:  Clear to auscultation without rales, wheezing or rhonchi  ABDOMEN: Soft,  non-tender, non-distended EXTREMITIES:  No edema; No deformity   ASSESSMENT AND PLAN: .    Atypical chest pain / Hx of pericarditis - Prior pericarditis 03/2022 at which time ibuprofen  not utilized due to CKD.  Completed 3 mos of Colchicine . Recent myoview  with no ischemia, no indication for further cardiac evaluation.   PAD / HLD, LDL goal <70 - R ABI abnormal 04/2023 with subsequent aAortoiliac duplex suggested >50% R CIA, R EIA, L EIA stenosis. No claudication symptoms.  Continue Rosuvastatin  40mg  daily.   HTN - BP well controlled. Continue current antihypertensive regimen Olmesartan  40mg  daily.   OSA - did not tolerate CPAP despite multiple masks. Sleep study 2020 moderate to severe OSA. Discussed referral to ENT for consideration of Inspire. If interested, she will contact us  for referral.   Palpitations - isolated episode which self resolved. Reviewed reassuring monitor. No further workup at this time.  RXI6a - Careful titration of diuretic and antihypertensive.         Dispo: follow up in 4 months  Signed, Reche GORMAN Finder, NP

## 2024-04-30 NOTE — Patient Instructions (Addendum)
 Medication Instructions:  Continue your current medications  *If you need a refill on your cardiac medications before your next appointment, please call your pharmacy*  Follow-Up: At [facility], you and your health needs are our priority.  As part of our continuing mission to provide you with exceptional heart care, our providers are all part of one team.  This team includes your primary Cardiologist (physician) and Advanced Practice Providers or APPs (Physician Assistants and Nurse Practitioners) who all work together to provide you with the care you need, when you need it.  Your next appointment:   4 month(s)  Provider:   Annabella Scarce, MD, Rosaline Bane, NP, or Reche Finder, NP    We recommend signing up for the patient portal called MyChart.  Sign up information is provided on this After Visit Summary.  MyChart is used to connect with patients for Virtual Visits (Telemedicine).  Patients are able to view lab/test results, encounter notes, upcoming appointments, etc.  Non-urgent messages can be sent to your provider as well.   To learn more about what you can do with MyChart, go to [email or web address].   Other Instructions      Inspire Device for sleep apnea. If you would like a referral to ENT to have this placed, please let us  know.

## 2024-05-30 ENCOUNTER — Encounter (HOSPITAL_BASED_OUTPATIENT_CLINIC_OR_DEPARTMENT_OTHER): Payer: Self-pay

## 2024-05-31 ENCOUNTER — Encounter (HOSPITAL_BASED_OUTPATIENT_CLINIC_OR_DEPARTMENT_OTHER)

## 2024-06-04 ENCOUNTER — Encounter (HOSPITAL_BASED_OUTPATIENT_CLINIC_OR_DEPARTMENT_OTHER): Payer: Self-pay | Admitting: Family

## 2024-06-18 ENCOUNTER — Encounter: Payer: Self-pay | Admitting: Radiology

## 2024-07-10 ENCOUNTER — Encounter (HOSPITAL_COMMUNITY): Payer: Self-pay

## 2024-07-10 ENCOUNTER — Ambulatory Visit (HOSPITAL_COMMUNITY): Payer: Self-pay

## 2024-07-10 ENCOUNTER — Ambulatory Visit (HOSPITAL_COMMUNITY)
Admission: EM | Admit: 2024-07-10 | Discharge: 2024-07-10 | Disposition: A | Discharge status: Home / Self Care | Attending: Family Medicine | Admitting: Family Medicine

## 2024-07-10 ENCOUNTER — Ambulatory Visit (INDEPENDENT_AMBULATORY_CARE_PROVIDER_SITE_OTHER)

## 2024-07-10 DIAGNOSIS — R0602 Shortness of breath: Secondary | ICD-10-CM

## 2024-07-10 DIAGNOSIS — R062 Wheezing: Secondary | ICD-10-CM

## 2024-07-10 DIAGNOSIS — R051 Acute cough: Secondary | ICD-10-CM

## 2024-07-10 MED ORDER — PREDNISONE 20 MG PO TABS
40.0000 mg | ORAL_TABLET | Freq: Every day | ORAL | 0 refills | Status: AC
Start: 2024-07-10 — End: ?

## 2024-07-10 MED ORDER — HYDROCODONE BIT-HOMATROP MBR 5-1.5 MG/5ML PO SOLN: 5.0000 mL | Freq: Four times a day (QID) | ORAL | 0 refills | Status: AC | PRN

## 2024-07-10 NOTE — Discharge Instructions (Signed)
 Be aware, your cough medication may cause drowsiness. Please do not drive, operate heavy machinery or make important decisions while on this medication, it can cloud your judgement.

## 2024-07-10 NOTE — ED Provider Notes (Signed)
 [facility]   [id number] 07/10/24 Arrival Time: 1235  ASSESSMENT & PLAN:  1. Acute cough   2. SOB (shortness of breath)   3. Wheezing    I have personally viewed and independently interpreted the imaging studies ordered this visit. CXR: no acute changes.  Discussed typical duration of likely viral illness. Begin: Meds ordered this encounter  Medications   HYDROcodone  bit-homatropine (HYCODAN) 5-1.5 MG/5ML syrup    Sig: Take 5 mLs by mouth every 6 (six) hours as needed for cough.    Dispense:  90 mL    Refill:  0   predniSONE  (DELTASONE ) 20 MG tablet    Sig: Take 2 tablets (40 mg total) by mouth daily.    Dispense:  10 tablet    Refill:  0   OTC symptom care as needed.    Follow-up Information     Maree Leni Edyth DELENA, MD.   Specialty: Family Medicine Why: If worsening or failing to improve as anticipated. Contact information: [address] [place] [address] [phone]                 Reviewed expectations re: course of current medical issues. Questions answered. Outlined signs and symptoms indicating need for more acute intervention. Understanding verbalized. After Visit Summary given.   SUBJECTIVE: History from: Patient. Alyssa Jefferson is a 78 y.o. female. Patient reports that she has a productive cough with green sputum and nasal congestion x 2 weeks.   Patient states she hs been taking Coricidin. Questions wheezing. Does feel SOB at times. Denies: fever. Normal PO intake without n/v/d.  OBJECTIVE:  Vitals:   07/10/24 1437  BP: (!) 131/58  Pulse: 80  Resp: 16  Temp: 98 F (36.7 C)  TempSrc: Oral  SpO2: 95%    General appearance: alert; no distress Eyes: PERRLA; EOMI; conjunctiva normal HENT: [place]; AT; with nasal congestion Neck: supple  Lungs: speaks full sentences without difficulty; unlabored; bilat exp wheezing Extremities: no edema Skin: warm and dry Neurologic: normal gait Psychological: alert and cooperative;  normal mood and affect  Labs:  Labs Reviewed - No data to display  Imaging: DG Chest 2 View Result Date: 07/10/2024 CLINICAL DATA:  Productive cough for 2 weeks.  Nasal congestion. EXAM: CHEST - 2 VIEW COMPARISON:  Radiographs 03/02/2022 and 01/19/2022. FINDINGS: The heart size and mediastinal contours are stable with mild aortic atherosclerosis. The lungs are clear. There is possible mild chronic central airway thickening. No confluent airspace disease, pleural effusion or pneumothorax. There are surgical clips in the left axilla and right paratracheal region. No acute osseous findings are evident. IMPRESSION: No evidence of acute cardiopulmonary process. Possible mild chronic central airway thickening. Electronically Signed   By: Elsie Perone M.D.   On: 07/10/2024 16:11    Allergies  Allergen Reactions   Atorvastatin  Calcium  Itching   Codeine Other (See Comments)    confusion   Lipitor [Atorvastatin  Calcium ] Itching    Past Medical History:  Diagnosis Date   Arthritis    knees, back   Cancer (HCC) 2004   breast   Depression    GERD (gastroesophageal reflux disease)    Glaucoma    Heart palpitations    High cholesterol    History of transfusion    many years ago   Hypertension    Hypothyroidism    Itching in the vaginal area    Nocturia    OSA (obstructive sleep apnea)    tried CPAP- but unsuccessful   Renal  disorder    Thyroid  disease    thyroid  removed   Social History   Socioeconomic History   Marital status: Widowed    Spouse name: Not on file   Number of children: 5   Years of education: Not on file   Highest education level: Not on file  Occupational History   Occupation: retired    Associate Professor: RETIRED  Tobacco Use   Smoking status: Former    Current packs/day: 0.00    Types: Cigarettes    Start date: 08/16/1958    Quit date: 08/16/1978    Years since quitting: 45.9   Smokeless tobacco: Never   Tobacco comments:    1 pack per week--10/16/12  Vaping  Use   Vaping status: Never Used  Substance and Sexual Activity   Alcohol use: No   Drug use: No   Sexual activity: Yes    Birth control/protection: Surgical    Comment: Hysterectomy  Other Topics Concern   Not on file  Social History Narrative   Not on file   Social Drivers of Health   Financial Resource Strain: Not on file  Food Insecurity: Not on file  Transportation Needs: Not on file  Physical Activity: Not on file  Stress: Not on file  Social Connections: Not on file  Intimate Partner Violence: Not on file   Family History  Problem Relation Age of Onset   Lung cancer Father    Breast cancer Sister    Ovarian cancer Sister    Colon cancer Neg Hx    Esophageal cancer Neg Hx    Stomach cancer Neg Hx    Pancreatic cancer Neg Hx    Liver disease Neg Hx    Past Surgical History:  Procedure Laterality Date   ABDOMINAL HYSTERECTOMY  1982   BSO   BREAST BIOPSY Right pt doesnt remember   benign   BREAST SURGERY  2004   left breast removed due to cancer no chemo or radiation   COLONOSCOPY     MASTECTOMY Left    RADIOLOGY WITH ANESTHESIA N/A 10/27/2023   Procedure: MRI WITH ANESTHESIA OF LUMBAR SPINE WITHOUT CONTRAST;  Surgeon: Radiologist, Medication, MD;  Location: [facility] OR;  Service: Radiology;  Laterality: N/A;   THYROIDECTOMY     due to knot   TOTAL KNEE ARTHROPLASTY Left 07/28/2015   Procedure: LEFT TOTAL KNEE ARTHROPLASTY;  Surgeon: Dempsey Moan, MD;  Location: [facility] ORS;  Service: Orthopedics;  Laterality: Left;     Rolinda Rogue, MD 07/10/24 1616

## 2024-07-10 NOTE — ED Triage Notes (Signed)
 Patient reports that she has a productive cough with green sputum and nasal congestion x 2 weeks.   Patient states she hs been taking Coricidin.

## 2024-07-27 ENCOUNTER — Other Ambulatory Visit: Payer: Self-pay | Admitting: Nephrology

## 2024-07-27 DIAGNOSIS — N1832 Chronic kidney disease, stage 3b: Secondary | ICD-10-CM

## 2024-07-27 DIAGNOSIS — N23 Unspecified renal colic: Secondary | ICD-10-CM

## 2024-07-31 ENCOUNTER — Other Ambulatory Visit

## 2024-08-24 ENCOUNTER — Telehealth: Payer: Self-pay | Admitting: Cardiovascular Disease

## 2024-08-24 ENCOUNTER — Encounter: Payer: Self-pay | Admitting: Cardiovascular Disease

## 2024-08-24 NOTE — Telephone Encounter (Signed)
 Attempted to contact patient 3 times appointment per recall with no response from patient, recall expungement letter mailed, recall deleted

## 2024-11-05 ENCOUNTER — Ambulatory Visit (HOSPITAL_BASED_OUTPATIENT_CLINIC_OR_DEPARTMENT_OTHER): Admitting: Cardiovascular Disease
